# Patient Record
Sex: Female | Born: 1985 | Race: Black or African American | Hispanic: No | Marital: Single | Smoking: Current some day smoker
Health system: Southern US, Community
[De-identification: ages and names within clinical notes are randomized; demographics above are authoritative.]

## PROBLEM LIST (undated history)

## (undated) ENCOUNTER — Inpatient Hospital Stay (HOSPITAL_COMMUNITY): Payer: Self-pay

## (undated) ENCOUNTER — Ambulatory Visit: Payer: MEDICAID | Source: Home / Self Care

## (undated) DIAGNOSIS — B3731 Acute candidiasis of vulva and vagina: Secondary | ICD-10-CM

## (undated) DIAGNOSIS — B373 Candidiasis of vulva and vagina: Secondary | ICD-10-CM

## (undated) DIAGNOSIS — J45909 Unspecified asthma, uncomplicated: Secondary | ICD-10-CM

## (undated) DIAGNOSIS — F329 Major depressive disorder, single episode, unspecified: Secondary | ICD-10-CM

## (undated) DIAGNOSIS — B9689 Other specified bacterial agents as the cause of diseases classified elsewhere: Secondary | ICD-10-CM

## (undated) DIAGNOSIS — D649 Anemia, unspecified: Secondary | ICD-10-CM

## (undated) DIAGNOSIS — N390 Urinary tract infection, site not specified: Secondary | ICD-10-CM

## (undated) DIAGNOSIS — Z9289 Personal history of other medical treatment: Secondary | ICD-10-CM

## (undated) DIAGNOSIS — F32A Depression, unspecified: Secondary | ICD-10-CM

## (undated) DIAGNOSIS — R112 Nausea with vomiting, unspecified: Secondary | ICD-10-CM

## (undated) DIAGNOSIS — R51 Headache: Secondary | ICD-10-CM

## (undated) DIAGNOSIS — A599 Trichomoniasis, unspecified: Secondary | ICD-10-CM

## (undated) DIAGNOSIS — Z9889 Other specified postprocedural states: Secondary | ICD-10-CM

## (undated) DIAGNOSIS — L02411 Cutaneous abscess of right axilla: Secondary | ICD-10-CM

## (undated) DIAGNOSIS — A749 Chlamydial infection, unspecified: Secondary | ICD-10-CM

## (undated) DIAGNOSIS — N760 Acute vaginitis: Principal | ICD-10-CM

## (undated) DIAGNOSIS — A549 Gonococcal infection, unspecified: Secondary | ICD-10-CM

## (undated) HISTORY — PX: OTHER SURGICAL HISTORY: SHX169

## (undated) HISTORY — PX: WISDOM TOOTH EXTRACTION: SHX21

## (undated) HISTORY — DX: Depression, unspecified: F32.A

---
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
## 2007-02-02 ENCOUNTER — Emergency Department (HOSPITAL_COMMUNITY): Admission: EM | Admit: 2007-02-02 | Discharge: 2007-02-02 | Payer: Self-pay | Admitting: Emergency Medicine

## 2007-02-06 ENCOUNTER — Emergency Department (HOSPITAL_COMMUNITY): Admission: EM | Admit: 2007-02-06 | Discharge: 2007-02-07 | Payer: Self-pay | Admitting: Emergency Medicine

## 2007-02-21 ENCOUNTER — Emergency Department (HOSPITAL_COMMUNITY): Admission: EM | Admit: 2007-02-21 | Discharge: 2007-02-21 | Payer: Self-pay | Admitting: Emergency Medicine

## 2007-02-24 ENCOUNTER — Ambulatory Visit: Payer: Self-pay | Admitting: Family Medicine

## 2007-04-14 ENCOUNTER — Emergency Department: Payer: Self-pay | Admitting: Emergency Medicine

## 2007-09-28 ENCOUNTER — Emergency Department (HOSPITAL_COMMUNITY): Admission: EM | Admit: 2007-09-28 | Discharge: 2007-09-28 | Payer: Self-pay | Admitting: Emergency Medicine

## 2007-10-17 ENCOUNTER — Emergency Department (HOSPITAL_COMMUNITY): Admission: EM | Admit: 2007-10-17 | Discharge: 2007-10-18 | Payer: Self-pay | Admitting: Emergency Medicine

## 2007-12-12 ENCOUNTER — Emergency Department (HOSPITAL_COMMUNITY): Admission: EM | Admit: 2007-12-12 | Discharge: 2007-12-13 | Payer: Self-pay | Admitting: Emergency Medicine

## 2007-12-22 HISTORY — PX: WISDOM TOOTH EXTRACTION: SHX21

## 2007-12-22 HISTORY — PX: CYSTECTOMY: SUR359

## 2008-01-07 ENCOUNTER — Emergency Department (HOSPITAL_COMMUNITY): Admission: EM | Admit: 2008-01-07 | Discharge: 2008-01-07 | Payer: Self-pay | Admitting: Family Medicine

## 2008-01-26 ENCOUNTER — Ambulatory Visit: Payer: Self-pay | Admitting: Family Medicine

## 2008-03-06 ENCOUNTER — Ambulatory Visit: Payer: Self-pay | Admitting: Family Medicine

## 2008-04-02 ENCOUNTER — Emergency Department (HOSPITAL_COMMUNITY): Admission: EM | Admit: 2008-04-02 | Discharge: 2008-04-02 | Payer: Self-pay | Admitting: Family Medicine

## 2008-05-02 ENCOUNTER — Ambulatory Visit (HOSPITAL_BASED_OUTPATIENT_CLINIC_OR_DEPARTMENT_OTHER): Admission: RE | Admit: 2008-05-02 | Discharge: 2008-05-02 | Payer: Self-pay | Admitting: General Surgery

## 2008-05-02 ENCOUNTER — Encounter (INDEPENDENT_AMBULATORY_CARE_PROVIDER_SITE_OTHER): Payer: Self-pay | Admitting: General Surgery

## 2008-05-19 ENCOUNTER — Emergency Department (HOSPITAL_COMMUNITY): Admission: EM | Admit: 2008-05-19 | Discharge: 2008-05-19 | Payer: Self-pay | Admitting: Emergency Medicine

## 2008-06-25 ENCOUNTER — Other Ambulatory Visit: Payer: Self-pay | Admitting: Obstetrics & Gynecology

## 2008-06-25 ENCOUNTER — Emergency Department (HOSPITAL_COMMUNITY): Admission: EM | Admit: 2008-06-25 | Discharge: 2008-06-25 | Payer: Self-pay | Admitting: Emergency Medicine

## 2008-10-05 ENCOUNTER — Emergency Department (HOSPITAL_COMMUNITY): Admission: EM | Admit: 2008-10-05 | Discharge: 2008-10-05 | Payer: Self-pay | Admitting: Emergency Medicine

## 2008-12-27 ENCOUNTER — Emergency Department (HOSPITAL_COMMUNITY): Admission: EM | Admit: 2008-12-27 | Discharge: 2008-12-27 | Payer: Self-pay | Admitting: Emergency Medicine

## 2009-02-05 ENCOUNTER — Emergency Department (HOSPITAL_COMMUNITY): Admission: EM | Admit: 2009-02-05 | Discharge: 2009-02-05 | Payer: Self-pay | Admitting: Emergency Medicine

## 2009-12-26 ENCOUNTER — Emergency Department (HOSPITAL_COMMUNITY): Admission: EM | Admit: 2009-12-26 | Discharge: 2009-12-26 | Payer: Self-pay | Admitting: Emergency Medicine

## 2010-01-31 ENCOUNTER — Emergency Department (HOSPITAL_COMMUNITY): Admission: EM | Admit: 2010-01-31 | Discharge: 2010-01-31 | Payer: Self-pay | Admitting: Emergency Medicine

## 2010-03-05 ENCOUNTER — Emergency Department (HOSPITAL_COMMUNITY): Admission: EM | Admit: 2010-03-05 | Discharge: 2010-03-05 | Payer: Self-pay | Admitting: Family Medicine

## 2010-03-06 ENCOUNTER — Emergency Department (HOSPITAL_COMMUNITY): Admission: EM | Admit: 2010-03-06 | Discharge: 2010-03-06 | Payer: Self-pay | Admitting: Emergency Medicine

## 2010-04-23 ENCOUNTER — Emergency Department (HOSPITAL_COMMUNITY): Admission: EM | Admit: 2010-04-23 | Discharge: 2010-04-23 | Payer: Self-pay | Admitting: Family Medicine

## 2010-05-10 ENCOUNTER — Emergency Department (HOSPITAL_COMMUNITY): Admission: EM | Admit: 2010-05-10 | Discharge: 2010-05-10 | Payer: Self-pay | Admitting: Family Medicine

## 2010-05-22 ENCOUNTER — Emergency Department (HOSPITAL_COMMUNITY): Admission: EM | Admit: 2010-05-22 | Discharge: 2010-05-23 | Payer: Self-pay | Admitting: Emergency Medicine

## 2010-05-22 ENCOUNTER — Emergency Department (HOSPITAL_COMMUNITY): Admission: EM | Admit: 2010-05-22 | Discharge: 2010-05-22 | Payer: Self-pay | Admitting: Emergency Medicine

## 2010-07-04 ENCOUNTER — Emergency Department (HOSPITAL_COMMUNITY): Admission: EM | Admit: 2010-07-04 | Discharge: 2010-07-04 | Payer: Self-pay | Admitting: Family Medicine

## 2010-07-14 ENCOUNTER — Ambulatory Visit: Payer: Self-pay | Admitting: Obstetrics and Gynecology

## 2010-07-14 ENCOUNTER — Inpatient Hospital Stay (HOSPITAL_COMMUNITY): Admission: AD | Admit: 2010-07-14 | Discharge: 2010-07-14 | Payer: Self-pay | Admitting: Obstetrics & Gynecology

## 2010-08-12 ENCOUNTER — Ambulatory Visit (HOSPITAL_COMMUNITY): Admission: RE | Admit: 2010-08-12 | Discharge: 2010-08-12 | Payer: Self-pay | Admitting: Family Medicine

## 2010-09-06 ENCOUNTER — Ambulatory Visit (HOSPITAL_COMMUNITY): Admission: RE | Admit: 2010-09-06 | Discharge: 2010-09-06 | Payer: Self-pay | Admitting: Family Medicine

## 2010-09-07 ENCOUNTER — Ambulatory Visit: Payer: Self-pay | Admitting: Family Medicine

## 2010-09-07 ENCOUNTER — Inpatient Hospital Stay (HOSPITAL_COMMUNITY): Admission: AD | Admit: 2010-09-07 | Discharge: 2010-09-07 | Payer: Self-pay | Admitting: Obstetrics & Gynecology

## 2010-09-19 ENCOUNTER — Ambulatory Visit: Payer: Self-pay | Admitting: Physician Assistant

## 2010-09-19 ENCOUNTER — Inpatient Hospital Stay (HOSPITAL_COMMUNITY): Admission: AD | Admit: 2010-09-19 | Discharge: 2010-09-19 | Payer: Self-pay | Admitting: Obstetrics and Gynecology

## 2010-09-22 ENCOUNTER — Ambulatory Visit (HOSPITAL_COMMUNITY): Admission: RE | Admit: 2010-09-22 | Discharge: 2010-09-22 | Payer: Self-pay | Admitting: Anesthesiology

## 2010-11-04 ENCOUNTER — Ambulatory Visit (HOSPITAL_COMMUNITY): Admission: RE | Admit: 2010-11-04 | Discharge: 2010-11-04 | Payer: Self-pay | Admitting: Family Medicine

## 2010-11-04 ENCOUNTER — Encounter: Payer: Self-pay | Admitting: Family Medicine

## 2010-11-08 ENCOUNTER — Inpatient Hospital Stay (HOSPITAL_COMMUNITY)
Admission: AD | Admit: 2010-11-08 | Discharge: 2010-11-08 | Payer: Self-pay | Source: Home / Self Care | Admitting: Anesthesiology

## 2010-11-22 ENCOUNTER — Observation Stay: Payer: Self-pay

## 2010-11-22 ENCOUNTER — Inpatient Hospital Stay (HOSPITAL_COMMUNITY)
Admission: AD | Admit: 2010-11-22 | Discharge: 2010-12-03 | Payer: Self-pay | Source: Home / Self Care | Attending: Obstetrics & Gynecology | Admitting: Obstetrics & Gynecology

## 2010-11-30 ENCOUNTER — Encounter: Payer: Self-pay | Admitting: Family Medicine

## 2010-12-21 HISTORY — DX: Maternal care for unspecified type scar from previous cesarean delivery: O34.219

## 2011-03-03 LAB — URINALYSIS, ROUTINE W REFLEX MICROSCOPIC
Bilirubin Urine: NEGATIVE
Bilirubin Urine: NEGATIVE
Ketones, ur: 15 mg/dL — AB
Ketones, ur: NEGATIVE mg/dL
Nitrite: NEGATIVE
Nitrite: NEGATIVE
Specific Gravity, Urine: 1.015 (ref 1.005–1.030)
Specific Gravity, Urine: 1.025 (ref 1.005–1.030)
Urobilinogen, UA: 0.2 mg/dL (ref 0.0–1.0)
Urobilinogen, UA: 0.2 mg/dL (ref 0.0–1.0)

## 2011-03-03 LAB — URINE CULTURE
Colony Count: NO GROWTH
Culture  Setup Time: 201111201132
Culture  Setup Time: 201112101745
Culture: NO GROWTH

## 2011-03-03 LAB — COMPREHENSIVE METABOLIC PANEL
ALT: 10 U/L (ref 0–35)
AST: 18 U/L (ref 0–37)
CO2: 19 mEq/L (ref 19–32)
Chloride: 105 mEq/L (ref 96–112)
GFR calc Af Amer: >60 mL/min (ref >60)
GFR calc non Af Amer: >60 mL/min (ref >60)
Sodium: 134 mEq/L — ABNORMAL LOW (ref 135–145)
Total Bilirubin: 0.3 mg/dL (ref 0.3–1.2)

## 2011-03-03 LAB — CBC
HCT: 22.4 % — ABNORMAL LOW (ref 36.0–46.0)
HCT: 25.4 % — ABNORMAL LOW (ref 36.0–46.0)
Hemoglobin: 7.4 g/dL — ABNORMAL LOW (ref 12.0–15.0)
Hemoglobin: 8.1 g/dL — ABNORMAL LOW (ref 12.0–15.0)
Hemoglobin: 9.1 g/dL — ABNORMAL LOW (ref 12.0–15.0)
MCH: 23.1 pg — ABNORMAL LOW (ref 26.0–34.0)
MCV: 72 fL — ABNORMAL LOW (ref 78.0–100.0)
MCV: 72 fL — ABNORMAL LOW (ref 78.0–100.0)
MCV: 72 fL — ABNORMAL LOW (ref 78.0–100.0)
Platelets: 151 10*3/uL (ref 150–400)
RBC: 3.53 MIL/uL — ABNORMAL LOW (ref 3.87–5.11)
RBC: 3.99 MIL/uL (ref 3.87–5.11)
RDW: 16.2 % — ABNORMAL HIGH (ref 11.5–15.5)
WBC: 11 10*3/uL — ABNORMAL HIGH (ref 4.0–10.5)
WBC: 12.4 10*3/uL — ABNORMAL HIGH (ref 4.0–10.5)
WBC: 13.8 10*3/uL — ABNORMAL HIGH (ref 4.0–10.5)

## 2011-03-03 LAB — STREP B DNA PROBE: Strep Group B Ag: POSITIVE

## 2011-03-03 LAB — DIFFERENTIAL
Basophils Absolute: 0 10*3/uL (ref 0.0–0.1)
Basophils Relative: 0 % (ref 0–1)
Eosinophils Relative: 1 % (ref 0–5)
Monocytes Absolute: 0.3 10*3/uL (ref 0.1–1.0)
Neutro Abs: 11.2 10*3/uL — ABNORMAL HIGH (ref 1.7–7.7)

## 2011-03-03 LAB — URINALYSIS, MICROSCOPIC ONLY
Bilirubin Urine: NEGATIVE
Glucose, UA: NEGATIVE mg/dL
Hgb urine dipstick: NEGATIVE
Ketones, ur: NEGATIVE mg/dL
Leukocytes, UA: NEGATIVE
pH: 7 (ref 5.0–8.0)

## 2011-03-03 LAB — GC/CHLAMYDIA PROBE AMP, URINE
Chlamydia, Swab/Urine, PCR: NEGATIVE
GC Probe Amp, Urine: NEGATIVE

## 2011-03-03 LAB — URINE MICROSCOPIC-ADD ON

## 2011-03-03 LAB — RAPID URINE DRUG SCREEN, HOSP PERFORMED: Tetrahydrocannabinol: NOT DETECTED

## 2011-03-05 LAB — URINALYSIS, ROUTINE W REFLEX MICROSCOPIC
Bilirubin Urine: NEGATIVE
Glucose, UA: NEGATIVE mg/dL
Hgb urine dipstick: NEGATIVE
Ketones, ur: >80 mg/dL — AB
Ketones, ur: NEGATIVE mg/dL
Nitrite: NEGATIVE
Specific Gravity, Urine: 1.02 (ref 1.005–1.030)
Urobilinogen, UA: 0.2 mg/dL (ref 0.0–1.0)
pH: 6 (ref 5.0–8.0)
pH: 6.5 (ref 5.0–8.0)

## 2011-03-05 LAB — URINE MICROSCOPIC-ADD ON

## 2011-03-05 LAB — URINE CULTURE: Culture  Setup Time: 201109302127

## 2011-03-05 LAB — WET PREP, GENITAL: Yeast Wet Prep HPF POC: NONE SEEN

## 2011-03-07 LAB — URINALYSIS, ROUTINE W REFLEX MICROSCOPIC
Glucose, UA: NEGATIVE mg/dL
Hgb urine dipstick: NEGATIVE
Specific Gravity, Urine: >1.03 — ABNORMAL HIGH (ref 1.005–1.030)
pH: 6 (ref 5.0–8.0)

## 2011-03-07 LAB — GC/CHLAMYDIA PROBE AMP, GENITAL: GC Probe Amp, Genital: NEGATIVE

## 2011-03-07 LAB — POCT URINALYSIS DIP (DEVICE)
Glucose, UA: NEGATIVE mg/dL
Nitrite: NEGATIVE
Specific Gravity, Urine: 1.02 (ref 1.005–1.030)
Urobilinogen, UA: 2 mg/dL — ABNORMAL HIGH (ref 0.0–1.0)

## 2011-03-07 LAB — WET PREP, GENITAL: Trich, Wet Prep: NONE SEEN

## 2011-03-07 LAB — POCT PREGNANCY, URINE: Preg Test, Ur: POSITIVE

## 2011-03-08 LAB — RAPID STREP SCREEN (MED CTR MEBANE ONLY): Streptococcus, Group A Screen (Direct): POSITIVE — AB

## 2011-03-09 LAB — URINALYSIS, ROUTINE W REFLEX MICROSCOPIC
Nitrite: POSITIVE — AB
Specific Gravity, Urine: 1.024 (ref 1.005–1.030)
Urobilinogen, UA: 2 mg/dL — ABNORMAL HIGH (ref 0.0–1.0)
pH: 6 (ref 5.0–8.0)

## 2011-03-09 LAB — URINE MICROSCOPIC-ADD ON

## 2011-03-09 LAB — WET PREP, GENITAL

## 2011-03-09 LAB — GC/CHLAMYDIA PROBE AMP, GENITAL: GC Probe Amp, Genital: NEGATIVE

## 2011-03-10 LAB — GC/CHLAMYDIA PROBE AMP, GENITAL
Chlamydia, DNA Probe: POSITIVE — AB
GC Probe Amp, Genital: NEGATIVE

## 2011-03-10 LAB — WET PREP, GENITAL

## 2011-04-02 ENCOUNTER — Inpatient Hospital Stay (INDEPENDENT_AMBULATORY_CARE_PROVIDER_SITE_OTHER)
Admission: RE | Admit: 2011-04-02 | Discharge: 2011-04-02 | Disposition: A | Discharge status: Home / Self Care | Payer: Self-pay | Source: Ambulatory Visit | Attending: Emergency Medicine | Admitting: Emergency Medicine

## 2011-04-02 ENCOUNTER — Inpatient Hospital Stay (HOSPITAL_COMMUNITY)
Admission: AD | Admit: 2011-04-02 | Discharge: 2011-04-02 | Disposition: A | Discharge status: Home / Self Care | Payer: Medicaid Other | Source: Ambulatory Visit | Attending: Obstetrics & Gynecology | Admitting: Obstetrics & Gynecology

## 2011-04-02 ENCOUNTER — Inpatient Hospital Stay (HOSPITAL_COMMUNITY): Payer: Medicaid Other

## 2011-04-02 DIAGNOSIS — R58 Hemorrhage, not elsewhere classified: Secondary | ICD-10-CM

## 2011-04-02 DIAGNOSIS — O209 Hemorrhage in early pregnancy, unspecified: Secondary | ICD-10-CM

## 2011-04-02 DIAGNOSIS — O200 Threatened abortion: Secondary | ICD-10-CM

## 2011-04-02 DIAGNOSIS — N949 Unspecified condition associated with female genital organs and menstrual cycle: Secondary | ICD-10-CM

## 2011-04-02 LAB — CBC
HCT: 32.5 % — ABNORMAL LOW (ref 36.0–46.0)
Hemoglobin: 9.7 g/dL — ABNORMAL LOW (ref 12.0–15.0)
MCHC: 29.8 g/dL — ABNORMAL LOW (ref 30.0–36.0)
MCV: 64.9 fL — ABNORMAL LOW (ref 78.0–100.0)
RDW: 18.4 % — ABNORMAL HIGH (ref 11.5–15.5)

## 2011-04-02 LAB — POCT I-STAT, CHEM 8
BUN: 4 mg/dL — ABNORMAL LOW (ref 6–23)
Calcium, Ion: 1.21 mmol/L (ref 1.12–1.32)
Chloride: 101 mEq/L (ref 96–112)
Creatinine, Ser: 0.7 mg/dL (ref 0.4–1.2)
Glucose, Bld: 88 mg/dL (ref 70–99)
Potassium: 3.6 mEq/L (ref 3.5–5.1)

## 2011-04-02 LAB — POCT URINALYSIS DIP (DEVICE)
Protein, ur: 30 mg/dL — AB
Specific Gravity, Urine: >=1.03 (ref 1.005–1.030)
pH: 5.5 (ref 5.0–8.0)

## 2011-04-06 LAB — URINALYSIS, ROUTINE W REFLEX MICROSCOPIC
Bilirubin Urine: NEGATIVE
Glucose, UA: NEGATIVE mg/dL
Ketones, ur: NEGATIVE mg/dL
Nitrite: NEGATIVE
Specific Gravity, Urine: 1.019 (ref 1.005–1.030)
pH: 6 (ref 5.0–8.0)

## 2011-04-06 LAB — DIFFERENTIAL
Basophils Relative: 0 % (ref 0–1)
Eosinophils Absolute: 0.2 10*3/uL (ref 0.0–0.7)
Eosinophils Relative: 4 % (ref 0–5)
Lymphs Abs: 1.1 10*3/uL (ref 0.7–4.0)
Monocytes Absolute: 0.2 10*3/uL (ref 0.1–1.0)
Monocytes Relative: 5 % (ref 3–12)

## 2011-04-06 LAB — URINE CULTURE

## 2011-04-06 LAB — URINE MICROSCOPIC-ADD ON

## 2011-04-06 LAB — POCT I-STAT, CHEM 8
BUN: 7 mg/dL (ref 6–23)
Calcium, Ion: 1.12 mmol/L (ref 1.12–1.32)
Creatinine, Ser: 0.7 mg/dL (ref 0.4–1.2)
Glucose, Bld: 95 mg/dL (ref 70–99)
Hemoglobin: 12.9 g/dL (ref 12.0–15.0)
TCO2: 23 mmol/L (ref 0–100)

## 2011-04-06 LAB — CBC
HCT: 33.7 % — ABNORMAL LOW (ref 36.0–46.0)
MCHC: 32.3 g/dL (ref 30.0–36.0)
MCV: 71.2 fL — ABNORMAL LOW (ref 78.0–100.0)
RBC: 4.73 MIL/uL (ref 3.87–5.11)
WBC: 4.3 10*3/uL (ref 4.0–10.5)

## 2011-04-06 LAB — PREGNANCY, URINE: Preg Test, Ur: NEGATIVE

## 2011-04-06 LAB — GC/CHLAMYDIA PROBE AMP, GENITAL: GC Probe Amp, Genital: NEGATIVE

## 2011-04-06 LAB — WET PREP, GENITAL: Trich, Wet Prep: NONE SEEN

## 2011-04-24 ENCOUNTER — Inpatient Hospital Stay (HOSPITAL_COMMUNITY)
Admission: AD | Admit: 2011-04-24 | Discharge: 2011-04-24 | Disposition: A | Discharge status: Home / Self Care | Payer: Medicaid Other | Source: Ambulatory Visit | Attending: Obstetrics & Gynecology | Admitting: Obstetrics & Gynecology

## 2011-04-24 DIAGNOSIS — O200 Threatened abortion: Secondary | ICD-10-CM

## 2011-04-24 LAB — HCG, QUANTITATIVE, PREGNANCY: hCG, Beta Chain, Quant, S: 42 m[IU]/mL — ABNORMAL HIGH (ref <5)

## 2011-04-24 LAB — WET PREP, GENITAL
Clue Cells Wet Prep HPF POC: NONE SEEN
Trich, Wet Prep: NONE SEEN
Yeast Wet Prep HPF POC: NONE SEEN

## 2011-04-25 LAB — GC/CHLAMYDIA PROBE AMP, GENITAL
Chlamydia, DNA Probe: NEGATIVE
GC Probe Amp, Genital: NEGATIVE

## 2011-05-05 NOTE — Op Note (Signed)
Beth Gibson, Beth Gibson                 ACCOUNT NO.:  [id number]   MEDICAL RECORD NO.:  [id number]          PATIENT TYPE:  AMB   LOCATION:  DSC                          FACILITY:  [facility]   PHYSICIAN:  Gabrielle Dare. Janee Morn, M.D.DATE OF BIRTH:  Feb 27, 1986   DATE OF PROCEDURE:  05/02/2008  DATE OF DISCHARGE:                               OPERATIVE REPORT   PREOPERATIVE DIAGNOSIS:  Sebaceous cyst left buttock.   POSTOPERATIVE DIAGNOSIS:  Sebaceous cyst left buttock.   PROCEDURE:  Excision sebaceous cyst left buttock.   SURGEON:  Gabrielle Dare. Janee Morn, MD   ANESTHESIA:  MAC.   HISTORY OF PRESENT ILLNESS:  [name] is a 25 year old African-  American female who I evaluated in the office last week for a  symptomatic sebaceous cyst on her left buttock.  There was no acute  infection noted, so she presents for elective excision.   PROCEDURE IN DETAIL:  Informed consent was obtained.  The patient was  identified in the preop holding area.  She received intravenous  antibiotics.  Her site was marked.  She was brought to the operating  room.  MAC anesthesia was administered by the anesthesia staff.  She was  placed in prone position.  The left buttock was prepped and draped in  sterile fashion.  A mixture of 0.25% Marcaine and 1% lidocaine with  epinephrine was injected under and around the cyst.  A sinus was noted  to the lateral aspect, so a transverse incision was made also  encompassing an ellipse of skin.  Subcutaneous tissues were dissected  down completely around the cyst wall and the cyst was excised completely  and sent to pathology.  The wound was copiously irrigated.  Some  additional local anesthetic was injected.  Primary meticulous hemostasis  was obtained.  The skin was then closed with a running 4-0 Monocryl  subcuticular stitch and then Dermabond was placed.  The patient  tolerated the procedure well without apparent complication and was taken  to the recovery room in stable  condition.      Gabrielle Dare Janee Morn, M.D.  Electronically Signed     BET/MEDQ  D:  05/02/2008  T:  05/02/2008  Job:  [id number]   cc:   Maurice March, M.D.

## 2011-05-07 ENCOUNTER — Ambulatory Visit: Payer: Self-pay | Admitting: Obstetrics and Gynecology

## 2011-05-09 ENCOUNTER — Inpatient Hospital Stay (INDEPENDENT_AMBULATORY_CARE_PROVIDER_SITE_OTHER)
Admission: RE | Admit: 2011-05-09 | Discharge: 2011-05-09 | Disposition: A | Discharge status: Home / Self Care | Payer: Medicaid Other | Source: Ambulatory Visit | Attending: Family Medicine | Admitting: Family Medicine

## 2011-05-09 ENCOUNTER — Inpatient Hospital Stay (HOSPITAL_COMMUNITY): Admission: RE | Admit: 2011-05-09 | Payer: Medicaid Other | Source: Ambulatory Visit

## 2011-05-09 DIAGNOSIS — IMO0002 Reserved for concepts with insufficient information to code with codable children: Secondary | ICD-10-CM

## 2011-05-15 ENCOUNTER — Inpatient Hospital Stay (HOSPITAL_COMMUNITY)
Admission: RE | Admit: 2011-05-15 | Discharge: 2011-05-15 | Disposition: A | Discharge status: Home / Self Care | Payer: Medicaid Other | Source: Ambulatory Visit | Attending: Family Medicine | Admitting: Family Medicine

## 2011-09-15 LAB — POCT PREGNANCY, URINE
Operator id: 235561
Preg Test, Ur: NEGATIVE

## 2011-09-15 LAB — POCT URINALYSIS DIP (DEVICE)
Nitrite: NEGATIVE
Operator id: 235561
Protein, ur: 30 — AB
Urobilinogen, UA: 1
pH: 6

## 2011-09-15 LAB — GC/CHLAMYDIA PROBE AMP, GENITAL: GC Probe Amp, Genital: NEGATIVE

## 2011-09-16 LAB — URINALYSIS, ROUTINE W REFLEX MICROSCOPIC
Glucose, UA: NEGATIVE
Nitrite: NEGATIVE
pH: 6.5

## 2011-09-16 LAB — URINE MICROSCOPIC-ADD ON

## 2011-09-16 LAB — URINE CULTURE: Colony Count: NO GROWTH

## 2011-09-16 LAB — PREGNANCY, URINE: Preg Test, Ur: NEGATIVE

## 2011-09-17 LAB — URINE MICROSCOPIC-ADD ON

## 2011-09-17 LAB — URINALYSIS, ROUTINE W REFLEX MICROSCOPIC
Bilirubin Urine: NEGATIVE
Hgb urine dipstick: NEGATIVE
Nitrite: NEGATIVE
Protein, ur: NEGATIVE
Specific Gravity, Urine: >1.03 — ABNORMAL HIGH
Urobilinogen, UA: 0.2

## 2011-09-17 LAB — POCT PREGNANCY, URINE
Operator id: 242691
Preg Test, Ur: NEGATIVE

## 2011-09-21 ENCOUNTER — Encounter (HOSPITAL_COMMUNITY): Payer: Self-pay | Admitting: *Deleted

## 2011-09-21 ENCOUNTER — Inpatient Hospital Stay (HOSPITAL_COMMUNITY): Payer: Medicaid Other

## 2011-09-21 ENCOUNTER — Inpatient Hospital Stay (HOSPITAL_COMMUNITY)
Admission: AD | Admit: 2011-09-21 | Discharge: 2011-09-21 | Disposition: A | Discharge status: Home / Self Care | Payer: Medicaid Other | Source: Ambulatory Visit | Attending: Obstetrics and Gynecology | Admitting: Obstetrics and Gynecology

## 2011-09-21 DIAGNOSIS — O99891 Other specified diseases and conditions complicating pregnancy: Secondary | ICD-10-CM | POA: Insufficient documentation

## 2011-09-21 DIAGNOSIS — O209 Hemorrhage in early pregnancy, unspecified: Secondary | ICD-10-CM

## 2011-09-21 DIAGNOSIS — N949 Unspecified condition associated with female genital organs and menstrual cycle: Secondary | ICD-10-CM | POA: Insufficient documentation

## 2011-09-21 HISTORY — DX: Trichomoniasis, unspecified: A59.9

## 2011-09-21 HISTORY — DX: Chlamydial infection, unspecified: A74.9

## 2011-09-21 LAB — COMPREHENSIVE METABOLIC PANEL
Albumin: 4.1
Alkaline Phosphatase: 48
BUN: 5 — ABNORMAL LOW
Calcium: 9.4
Creatinine, Ser: 0.62
Glucose, Bld: 93
Total Protein: 8.2

## 2011-09-21 LAB — URINE MICROSCOPIC-ADD ON

## 2011-09-21 LAB — DIFFERENTIAL
Basophils Relative: 1
Lymphocytes Relative: 18
Monocytes Absolute: 0.4
Monocytes Relative: 6
Neutro Abs: 4
Neutrophils Relative %: 72

## 2011-09-21 LAB — URINALYSIS, ROUTINE W REFLEX MICROSCOPIC
Bilirubin Urine: NEGATIVE
Bilirubin Urine: NEGATIVE
Glucose, UA: NEGATIVE mg/dL
Nitrite: NEGATIVE
Specific Gravity, Urine: 1.02 (ref 1.005–1.030)
Specific Gravity, Urine: 1.018
Urobilinogen, UA: 1
pH: 7

## 2011-09-21 LAB — CBC
HCT: 35.9 — ABNORMAL LOW
Hemoglobin: 11.4 — ABNORMAL LOW
MCHC: 31.8
MCV: 71.8 — ABNORMAL LOW
Platelets: 223
RDW: 15.7 — ABNORMAL HIGH

## 2011-09-21 LAB — POCT PREGNANCY, URINE: Preg Test, Ur: POSITIVE

## 2011-09-21 LAB — PREGNANCY, URINE: Preg Test, Ur: NEGATIVE

## 2011-09-21 LAB — MONONUCLEOSIS SCREEN: Mono Screen: NEGATIVE

## 2011-09-21 LAB — LIPASE, BLOOD: Lipase: 22

## 2011-09-21 LAB — RAPID STREP SCREEN (MED CTR MEBANE ONLY): Streptococcus, Group A Screen (Direct): NEGATIVE

## 2011-09-21 LAB — HCG, QUANTITATIVE, PREGNANCY: hCG, Beta Chain, Quant, S: 4718 m[IU]/mL — ABNORMAL HIGH (ref <5)

## 2011-09-21 NOTE — ED Notes (Signed)
States feels pain and pressure over previous "c-section area." "Like I have to pee."

## 2011-09-21 NOTE — Progress Notes (Signed)
Pt states she had a POS HPT about one week ago. Started having severe lower abdominal pain last night. Continues to have abdominal pain, but is less than last night. States she had some spotting when she took the test, none now.

## 2011-09-21 NOTE — ED Provider Notes (Signed)
History   Beth Gibson is a 25 year old G3 P0111 who presents today for care of pelvic pain. The patient has not initiated [facility] yet, but plans to return to the [facility] as she has for previous pregnancy. She is approximately [redacted]w[redacted]d based on LMP, although she is not certain the date. The patient endorses pressure with urination, frequency, urgency and urinary retention without burning or increased pain with urination. She also endorses an episode of spotting x1 day last week. She admits to a thick, white vaginal discharge with foul odor. She does not have itching of burning of the vagina or vulva. She does voice some concern for STDs as she has a h/o chlamydia and trichomonas. She received treatment for both of these in the past and is sure her partner was also treated. She is still with the same partner.   Chief Complaint  Patient presents with  . Abdominal Pain   HPI  OB History    Grav Para Term Preterm Abortions TAB SAB Ect Mult Living   3 1 0 1 1 0 1 0 0 1       Past Medical History  Diagnosis Date  . Chlamydia   . Trichomonas     Past Surgical History  Procedure Date  . Cyst removed from l buttock   . Cesarean section   . Wisdom tooth extraction     No family history on file.  History  Substance Use Topics  . Smoking status: Never Smoker   . Smokeless tobacco: Never Used  . Alcohol Use: 0.5 oz/week    1 drink(s) per week     "Awhile back"    Allergies: No Known Allergies  No prescriptions prior to admission    ROS Physical Exam   Blood pressure 117/79, pulse 88, temperature 98.7 F (37.1 C), temperature source Oral, resp. rate 16, height 5\' 9"  (1.753 m), weight 91.899 kg (202 lb 9.6 oz), last menstrual period 08/20/2011.  Physical Exam  Constitutional: She is oriented to person, place, and time. She appears well-developed and well-nourished. No distress.  HENT:  Head: Normocephalic and atraumatic.  Eyes: Pupils are equal, round, and reactive to light.  Neck: Normal  range of motion.  Cardiovascular: Normal rate, regular rhythm and normal heart sounds.  Exam reveals no gallop and no friction rub.   No murmur heard. Respiratory: Effort normal and breath sounds normal.  GI: Soft. Bowel sounds are normal. She exhibits no distension and no mass. There is no tenderness. There is no guarding.  Genitourinary: Vagina normal and uterus normal. Uterus is not tender. Cervix exhibits discharge (thin, white mucous discharge). Cervix exhibits no motion tenderness and no friability. Right adnexum displays no mass and no tenderness. Left adnexum displays no mass and no tenderness. No erythema, tenderness or bleeding around the vagina.  Musculoskeletal: Normal range of motion.  Neurological: She is alert and oriented to person, place, and time.  Skin: Skin is warm and dry. No rash noted. No erythema.  Psychiatric: She has a normal mood and affect. Her behavior is normal.    MAU Course  Procedures  Assessment and Plan  Assessment: 1. Pregnancy 5w 0d per [place] today; consistent with BhCG levels 2. Pelvic pain with cervical discharge, unknown cause  Wet prep negative today, GC/CT sent today  UA negative today  Plan: 1. Return to MAU in 2 days for follow-up BhCG since yolk sac was not seen on [place] today 2. Call the [facility] to initiate PNC 3. Return here  as needed or if you have further bleeding or abdominal pain   Beth Gibson 09/21/2011, 2:45 PM   I was with the PA student during the exam and agree with the above plan.  Beth Gibson, DrHSc, MPAS, PA-C   Henrietta Hoover, PA 09/22/11 1428

## 2011-09-21 NOTE — ED Notes (Signed)
Patient to F/U in 48 hours for repeat BHCG

## 2011-09-21 NOTE — ED Notes (Signed)
E. Rice, PA discussed D/C instrs. and F/U

## 2011-09-22 LAB — GC/CHLAMYDIA PROBE AMP, GENITAL: Chlamydia, DNA Probe: NEGATIVE

## 2011-09-23 NOTE — ED Provider Notes (Signed)
Agree with above note.  Beth Gibson 09/23/2011 9:26 AM

## 2011-09-25 LAB — WET PREP, GENITAL

## 2011-09-25 LAB — GC/CHLAMYDIA PROBE AMP, GENITAL: GC Probe Amp, Genital: NEGATIVE

## 2011-09-25 LAB — POCT PREGNANCY, URINE
Operator id: 25670
Preg Test, Ur: NEGATIVE

## 2011-09-30 LAB — URINE MICROSCOPIC-ADD ON

## 2011-09-30 LAB — URINALYSIS, ROUTINE W REFLEX MICROSCOPIC
Glucose, UA: NEGATIVE
Protein, ur: 30 — AB
Specific Gravity, Urine: 1.036 — ABNORMAL HIGH
Urobilinogen, UA: 1

## 2011-09-30 LAB — CBC
HCT: 36.1
Hemoglobin: 11.3 — ABNORMAL LOW
MCV: 72.7 — ABNORMAL LOW
RBC: 4.96
WBC: 6.8

## 2011-09-30 LAB — BASIC METABOLIC PANEL
CO2: 25
Chloride: 97
GFR calc Af Amer: >60
Potassium: 3.8

## 2011-09-30 LAB — DIFFERENTIAL
Eosinophils Absolute: 0.1
Eosinophils Relative: 1
Lymphs Abs: 1.2
Monocytes Absolute: 0.1 — ABNORMAL LOW
Monocytes Relative: 2 — ABNORMAL LOW
Neutrophils Relative %: 79 — ABNORMAL HIGH

## 2011-09-30 LAB — WET PREP, GENITAL

## 2011-09-30 LAB — GC/CHLAMYDIA PROBE AMP, GENITAL: Chlamydia, DNA Probe: POSITIVE — AB

## 2011-10-01 LAB — POCT PREGNANCY, URINE
Operator id: 29452
Preg Test, Ur: NEGATIVE

## 2011-10-01 LAB — URINALYSIS, ROUTINE W REFLEX MICROSCOPIC
Protein, ur: NEGATIVE
Urobilinogen, UA: 1

## 2011-10-01 LAB — URINE MICROSCOPIC-ADD ON

## 2011-10-01 LAB — RAPID STREP SCREEN (MED CTR MEBANE ONLY): Streptococcus, Group A Screen (Direct): NEGATIVE

## 2011-10-11 ENCOUNTER — Emergency Department (HOSPITAL_COMMUNITY)
Admission: EM | Admit: 2011-10-11 | Discharge: 2011-10-11 | Disposition: A | Discharge status: Home / Self Care | Payer: Medicaid Other | Attending: Emergency Medicine | Admitting: Emergency Medicine

## 2011-10-11 DIAGNOSIS — IMO0002 Reserved for concepts with insufficient information to code with codable children: Secondary | ICD-10-CM | POA: Insufficient documentation

## 2011-11-30 ENCOUNTER — Other Ambulatory Visit (HOSPITAL_COMMUNITY): Payer: Self-pay | Admitting: Physician Assistant

## 2011-11-30 DIAGNOSIS — Z3689 Encounter for other specified antenatal screening: Secondary | ICD-10-CM

## 2011-11-30 LAB — CBC
HCT: 29 % — AB (ref 36–46)
Hemoglobin: 9 g/dL — AB (ref 12.0–16.0)

## 2011-11-30 LAB — GC/CHLAMYDIA PROBE AMP, GENITAL: Gonorrhea: NEGATIVE

## 2011-11-30 LAB — HEPATITIS B SURFACE ANTIGEN: Hepatitis B Surface Ag: NEGATIVE

## 2011-11-30 LAB — ABO/RH: RH Type: POSITIVE

## 2011-12-03 NOTE — Progress Notes (Signed)
Pt had flu vaccine given @ [facility].

## 2011-12-10 ENCOUNTER — Ambulatory Visit (INDEPENDENT_AMBULATORY_CARE_PROVIDER_SITE_OTHER): Payer: Medicaid Other | Admitting: Obstetrics and Gynecology

## 2011-12-10 DIAGNOSIS — Z9889 Other specified postprocedural states: Secondary | ICD-10-CM

## 2011-12-10 DIAGNOSIS — O34219 Maternal care for unspecified type scar from previous cesarean delivery: Secondary | ICD-10-CM

## 2011-12-10 DIAGNOSIS — E669 Obesity, unspecified: Secondary | ICD-10-CM | POA: Insufficient documentation

## 2011-12-10 DIAGNOSIS — O09219 Supervision of pregnancy with history of pre-term labor, unspecified trimester: Secondary | ICD-10-CM

## 2011-12-10 DIAGNOSIS — Z98891 History of uterine scar from previous surgery: Secondary | ICD-10-CM

## 2011-12-10 LAB — POCT URINALYSIS DIP (DEVICE)
Bilirubin Urine: NEGATIVE
Glucose, UA: NEGATIVE mg/dL
Hgb urine dipstick: NEGATIVE
Ketones, ur: NEGATIVE mg/dL
Nitrite: NEGATIVE
Protein, ur: NEGATIVE mg/dL
Specific Gravity, Urine: >=1.03 (ref 1.005–1.030)
Urobilinogen, UA: 1 mg/dL (ref 0.0–1.0)
pH: 5.5 (ref 5.0–8.0)

## 2011-12-10 MED ORDER — HYDROXYPROGESTERONE CAPROATE 250 MG/ML IM OIL
250.0000 mg | TOPICAL_OIL | INTRAMUSCULAR | Status: DC
  Administered 2011-12-10 – 2012-04-14 (×14): 250 mg via INTRAMUSCULAR

## 2011-12-10 MED ORDER — FLUCONAZOLE 150 MG PO TABS: 150.0000 mg | ORAL_TABLET | Freq: Once | ORAL | Status: AC

## 2011-12-10 NOTE — Progress Notes (Signed)
Patient doing well without complaints. Reports minimal nausea. Patient counseled on the use of 17-OHP and agreed. Patient scheduled for anatomy ultrasound on 12/23/2011. Patient with positive yeast on pap and c/o vaginal discharge with pruristis- will treat with diflucan

## 2011-12-10 NOTE — Progress Notes (Signed)
Pain/pressure- @ c-section incision.  Pulse-87 Vaginal discharge- white

## 2011-12-10 NOTE — Patient Instructions (Signed)
Hydroxyprogesterone solution for injection What is this medicine? HYDROXYPROGESTERONE (hye drox ee proe JES ter one) is a female hormone. This medicine is used in women who are pregnant and who have delivered a baby too early (preterm) in the past. It helps lower the risk of having a preterm baby again. This medicine may be used for other purposes; ask your health care provider or pharmacist if you have questions. What should I tell my health care provider before I take this medicine? They need to know if you have any of these conditions: -blood clotting disorders -breast, cervical, uterine, or vaginal cancer -depression -diabetes or prediabetes -heart disease -high blood pressure -kidney disease -liver disease -lung or breathing disease, like asthma -migraine headaches -seizures -vaginal bleeding -an unusual or allergic reaction to hydroxyprogesterone, other hormones, medicines, foods, dyes, castor oil, benzyl alcohol, or other preservatives -breast-feeding How should I use this medicine? This medicine is for injection into a muscle. It is given by a health care professional in a hospital or clinic setting. You are likely to get an injection once a week to prevent preterm delivery. Talk to your pediatrician regarding the use of this medicine in children. Special care may be needed. Overdosage: If you think you've taken too much of this medicine contact a poison control center or emergency room at once. Overdosage: If you think you have taken too much of this medicine contact a poison control center or emergency room at once. NOTE: This medicine is only for you. Do not share this medicine with others. What if I miss a dose? It is important not to miss your dose. Call your doctor or health care professional if you are unable to keep an appointment. What may interact with this medicine? -acetaminophen -bupropion -clozapine -efavirenz -halothane -methadone -nicotine -theophylline,  aminophylline -tizanidine This list may not describe all possible interactions. Give your health care provider a list of all the medicines, herbs, non-prescription drugs, or dietary supplements you use. Also tell them if you smoke, drink alcohol, or use illegal drugs. Some items may interact with your medicine. What should I watch for while using this medicine? Your condition will be monitored carefully while you are receiving this medicine. What side effects may I notice from receiving this medicine? Side effects that you should report to your doctor or health care professional as soon as possible: -allergic reactions like skin rash, itching or hives, swelling of the face, lips, or tongue -breathing problems -breast tissue changes or discharge -changes in vision -confusion, trouble speaking or understanding -depressed mood -increased hunger or thirst -increased urination -pain, redness, or irritation at site where injected -pain, swelling, warmth in the leg -shortness of breath, chest pain, swelling in a leg -sudden numbness or weakness of the face, arm or leg -sudden severe headaches -trouble walking, dizziness, loss of balance or coordination -unusually weak or tired -vaginal bleeding -yellowing of the eyes or skin  Side effects that usually do not require medical attention (report to your doctor or health care professional if they continue or are bothersome): -changes in emotions or moods -diarrhea -fluid retention and swelling -nausea This list may not describe all possible side effects. Call your doctor for medical advice about side effects. You may report side effects to FDA at 1-800-FDA-1088. Where should I keep my medicine? This drug is given in a hospital or clinic and will not be stored at home. NOTE: This sheet is a summary. It may not cover all possible information. If you have questions about  this medicine, talk to your doctor, pharmacist, or health care provider.   2012, Elsevier/Gold Standard. (01/28/2010 11:17:12 AM)  Preterm Birth Preterm birth is a birth which happens before 37 weeks of pregnancy. Most pregnancies last about 39 to 41 weeks. Every week in the womb is important and is beneficial to the health of the infant. Infants born before 37 weeks of pregnancy are at a higher risk for complications. Depending on when the infant was born, he or she may be:  Late preterm. Born between 32 and 37 weeks of pregnancy.   Very preterm. Born at less than 32 weeks of pregnancy.   Extremely preterm. Born at less than 25 weeks of pregnancy.  The earlier a baby is born, the more likely the child will have issues related to prematurity. Complications and problems that can be seen in infants born too early include:  Problems breathing (respiratory distress syndrome).   Low birth weight.   Problems feeding.   Sleeping problems.   Yellowing of the skin (jaundice).   Infections such as pneumonia.  Babies that are born very preterm or extremely preterm are at risk for more serious medical issues. These include:  Breathing issues.   Eyesight issues.   Brain development issues (intraventricular hemorrhage).   Behavioral and emotional development issues.   Growth and developmental delays.   Cerebral palsy.   Serious feeding or bowel complications (necrotizing enterocolitis).  CAUSES  There are 2 broad categories of preterm birth.  Spontaneous preterm birth. This is a birth resulting from preterm labor (not medically induced) or preterm premature rupture of membranes (PPROM).   Indicated preterm birth. This is a birth resulting from labor being medically induced due to health, personal, or social reasons.  Preterm birth may be related to certain medical conditions, lifestyle factors, or demographic factors encountered by the mother or fetus.   Medical conditions include:   Multiple gestations (twins, triplets, and so on).   Infection.    Diabetes.   Heart disease.   Kidney disease.   Cervical or uterine abnormalities.   Being underweight.   High blood pressure or preeclampsia.   Premature rupture of membranes (PROM).   Birth defects in the fetus.   Lifestyle factors include:   Poor prenatal care.   Poor nutrition or anemia.   Cigarette smoking.   Consuming alcohol.   High levels of stress and lack of social or emotional support.   Exposure to chemical or environmental toxins.   Substance abuse.   Demographic factors include:   African-American ethnicity.   Age (younger than 63 or older than 59).   Low socioeconomic status.  Women with a history of preterm labor or who become pregnant within 18 months of giving birth are also at increased risk for preterm birth. DIAGNOSIS  Your caregiver may request additional tests to diagnose underlying complications resulting from preterm birth. Tests on the infant may include:  Physical exam.   Blood tests.   Chest X-rays.   Heart-lung monitoring.  PREVENTION There are some things you can do to help lower your risk of having a preterm infant in the future. These include:  Good prenatal care throughout the entire pregnancy. See a caregiver regularly for advice and tests.   Management of underlying medical conditions.   Proper self-care and lifestyle changes.   Proper diet and weight control.   Watching for signs of various infections.  TREATMENT  After birth, special care will be taken to assess any problems or complications of  the infant. Supportive care will be provided for the infant. Treatment depends on what problems are present and any complications that develop. Some preterm infants are cared for in a neonatal intensive care unit. In general, care may include:  Maintaining temperature and oxygen in a clear heated box (baby isolette).   Monitoring the infant's heart rate, breathing, and level of oxygen in the blood.   Monitoring for  signs of infection and, if needed, giving intravenous (IV) antibiotic medicine.   Inserting a feeding tube (nose, mouth) or giving IV nutrition if unable to feed.   Inserting a breathing tube (ventilation).   Respiration support (continuous positive airway pressure [CPAP] or oxygen).  Treatment will change as the infant builds up strength and is able to breathe and eat on his or her own. For some infants, no special treatment is necessary. Parents may be educated on the potential health risks of prematurity to the infant. HOME CARE INSTRUCTIONS  Understand your infant's special conditions and needs. It may be reassuring to learn about infant CPR.   Monitor your infant in the car seat until he or she grows and matures. Infant car seats can cause breathing difficulties for preterm infants.   Keep your infant warm. Dress your infant in layers and keep him or her away from drafts, especially in cold months of the year.   Wash your hands thoroughly after going to the bathroom or changing a diaper. Late preterm infants may be more prone to infection.   Follow all your caregiver's instructions for providing support and care to your preterm infant.   Get support from organizations and groups that understand your challenges.   Follow up with your infant's caregiver as directed.  SEEK MEDICAL CARE IF:  Your infant has feeding difficulties.   Your infant has sleeping difficulties.   Your infant has breathing difficulties.   Your infant's skin starts to look yellow (jaundice).   Your infant shows signs of infection like a stuffy nose, fever, crying, or bluish color of the skin.  FOR MORE INFORMATION March of D[employer]: www.marchofdimes.com Prematurity.org: www.prematurity.org Document Released: 02/27/2004 Document Revised: 08/19/2011 Document Reviewed: 05/01/2010 [facility] Patient Information 2012 [place], [place].

## 2011-12-12 ENCOUNTER — Inpatient Hospital Stay (HOSPITAL_COMMUNITY)
Admission: AD | Admit: 2011-12-12 | Discharge: 2011-12-12 | Discharge status: Against Medical Advice | Payer: Medicaid Other | Source: Ambulatory Visit | Attending: Obstetrics and Gynecology | Admitting: Obstetrics and Gynecology

## 2011-12-12 DIAGNOSIS — O99891 Other specified diseases and conditions complicating pregnancy: Secondary | ICD-10-CM | POA: Insufficient documentation

## 2011-12-12 DIAGNOSIS — R109 Unspecified abdominal pain: Secondary | ICD-10-CM | POA: Insufficient documentation

## 2011-12-12 LAB — URINALYSIS, ROUTINE W REFLEX MICROSCOPIC
Bilirubin Urine: NEGATIVE
Ketones, ur: 15 mg/dL — AB
Nitrite: NEGATIVE
Protein, ur: NEGATIVE mg/dL

## 2011-12-12 LAB — URINE MICROSCOPIC-ADD ON

## 2011-12-12 NOTE — ED Notes (Signed)
Called patient three times at 1940, 2000, and 2016. No answer. Patient not in front lobby or radiology waiting room.

## 2011-12-12 NOTE — Progress Notes (Signed)
Pt reports having sharp abd pain on and off near c-section scar. reports having some spotting as well since yesterday.

## 2011-12-17 ENCOUNTER — Ambulatory Visit: Payer: Self-pay

## 2011-12-22 DIAGNOSIS — Z9289 Personal history of other medical treatment: Secondary | ICD-10-CM

## 2011-12-22 HISTORY — DX: Personal history of other medical treatment: Z92.89

## 2011-12-23 ENCOUNTER — Ambulatory Visit (HOSPITAL_COMMUNITY)
Admission: RE | Admit: 2011-12-23 | Discharge: 2011-12-23 | Disposition: A | Discharge status: Home / Self Care | Payer: Medicaid Other | Source: Ambulatory Visit | Attending: Physician Assistant | Admitting: Physician Assistant

## 2011-12-23 DIAGNOSIS — Z8751 Personal history of pre-term labor: Secondary | ICD-10-CM | POA: Insufficient documentation

## 2011-12-23 DIAGNOSIS — Z3689 Encounter for other specified antenatal screening: Secondary | ICD-10-CM

## 2011-12-23 DIAGNOSIS — O99210 Obesity complicating pregnancy, unspecified trimester: Secondary | ICD-10-CM | POA: Insufficient documentation

## 2011-12-23 DIAGNOSIS — O358XX0 Maternal care for other (suspected) fetal abnormality and damage, not applicable or unspecified: Secondary | ICD-10-CM | POA: Insufficient documentation

## 2011-12-23 DIAGNOSIS — E669 Obesity, unspecified: Secondary | ICD-10-CM | POA: Insufficient documentation

## 2011-12-23 DIAGNOSIS — O34219 Maternal care for unspecified type scar from previous cesarean delivery: Secondary | ICD-10-CM | POA: Insufficient documentation

## 2011-12-25 ENCOUNTER — Ambulatory Visit (INDEPENDENT_AMBULATORY_CARE_PROVIDER_SITE_OTHER): Payer: Medicaid Other | Admitting: *Deleted

## 2011-12-25 ENCOUNTER — Encounter: Payer: Self-pay | Admitting: *Deleted

## 2011-12-25 DIAGNOSIS — O09219 Supervision of pregnancy with history of pre-term labor, unspecified trimester: Secondary | ICD-10-CM

## 2011-12-31 ENCOUNTER — Ambulatory Visit (INDEPENDENT_AMBULATORY_CARE_PROVIDER_SITE_OTHER): Payer: Self-pay | Admitting: *Deleted

## 2011-12-31 DIAGNOSIS — O09219 Supervision of pregnancy with history of pre-term labor, unspecified trimester: Secondary | ICD-10-CM

## 2012-01-07 ENCOUNTER — Ambulatory Visit (INDEPENDENT_AMBULATORY_CARE_PROVIDER_SITE_OTHER): Payer: Medicaid Other | Admitting: Family Medicine

## 2012-01-07 DIAGNOSIS — O09219 Supervision of pregnancy with history of pre-term labor, unspecified trimester: Secondary | ICD-10-CM

## 2012-01-07 DIAGNOSIS — O0990 Supervision of high risk pregnancy, unspecified, unspecified trimester: Secondary | ICD-10-CM | POA: Insufficient documentation

## 2012-01-07 LAB — POCT URINALYSIS DIP (DEVICE)
Glucose, UA: 250 mg/dL — AB
Protein, ur: 30 mg/dL — AB
Specific Gravity, Urine: >=1.03 (ref 1.005–1.030)
Urobilinogen, UA: 1 mg/dL (ref 0.0–1.0)

## 2012-01-07 NOTE — Progress Notes (Signed)
Doing well---needs 17P Check urine culture

## 2012-01-07 NOTE — Patient Instructions (Signed)
Pregnancy - Second Trimester The second trimester of pregnancy (3 to 6 months) is a period of rapid growth for you and your baby. At the end of the sixth month, your baby is about 9 inches long and weighs 1 1/2 pounds. You will begin to feel the baby move between 18 and 20 weeks of the pregnancy. This is called quickening. Weight gain is faster. A clear fluid (colostrum) may leak out of your breasts. You may feel small contractions of the womb (uterus). This is known as false labor or Braxton-Hicks contractions. This is like a practice for labor when the baby is ready to be born. Usually, the problems with morning sickness have usually passed by the end of your first trimester. Some women develop small dark blotches (called cholasma, mask of pregnancy) on their face that usually goes away after the baby is born. Exposure to the sun makes the blotches worse. Acne may also develop in some pregnant women and pregnant women who have acne, may find that it goes away. PRENATAL EXAMS  Blood work may continue to be done during prenatal exams. These tests are done to check on your health and the probable health of your baby. Blood work is used to follow your blood levels (hemoglobin). Anemia (low hemoglobin) is common during pregnancy. Iron and vitamins are given to help prevent this. You will also be checked for diabetes between 24 and 28 weeks of the pregnancy. Some of the previous blood tests may be repeated.   The size of the uterus is measured during each visit. This is to make sure that the baby is continuing to grow properly according to the dates of the pregnancy.   Your blood pressure is checked every prenatal visit. This is to make sure you are not getting toxemia.   Your urine is checked to make sure you do not have an infection, diabetes or protein in the urine.   Your weight is checked often to make sure gains are happening at the suggested rate. This is to ensure that both you and your baby are  growing normally.   Sometimes, an ultrasound is performed to confirm the proper growth and development of the baby. This is a test which bounces harmless sound waves off the baby so your caregiver can more accurately determine due dates.  Sometimes, a specialized test is done on the amniotic fluid surrounding the baby. This test is called an amniocentesis. The amniotic fluid is obtained by sticking a needle into the belly (abdomen). This is done to check the chromosomes in instances where there is a concern about possible genetic problems with the baby. It is also sometimes done near the end of pregnancy if an early delivery is required. In this case, it is done to help make sure the baby's lungs are mature enough for the baby to live outside of the womb. CHANGES OCCURING IN THE SECOND TRIMESTER OF PREGNANCY Your body goes through many changes during pregnancy. They vary from person to person. Talk to your caregiver about changes you notice that you are concerned about.  During the second trimester, you will likely have an increase in your appetite. It is normal to have cravings for certain foods. This varies from person to person and pregnancy to pregnancy.   Your lower abdomen will begin to bulge.   You may have to urinate more often because the uterus and baby are pressing on your bladder. It is also common to get more bladder infections during pregnancy (  pain with urination). You can help this by drinking lots of fluids and emptying your bladder before and after intercourse.   You may begin to get stretch marks on your hips, abdomen, and breasts. These are normal changes in the body during pregnancy. There are no exercises or medications to take that prevent this change.   You may begin to develop swollen and bulging veins (varicose veins) in your legs. Wearing support hose, elevating your feet for 15 minutes, 3 to 4 times a day and limiting salt in your diet helps lessen the problem.    Heartburn may develop as the uterus grows and pushes up against the stomach. Antacids recommended by your caregiver helps with this problem. Also, eating smaller meals 4 to 5 times a day helps.   Constipation can be treated with a stool softener or adding bulk to your diet. Drinking lots of fluids, vegetables, fruits, and whole grains are helpful.   Exercising is also helpful. If you have been very active up until your pregnancy, most of these activities can be continued during your pregnancy. If you have been less active, it is helpful to start an exercise program such as walking.   Hemorrhoids (varicose veins in the rectum) may develop at the end of the second trimester. Warm sitz baths and hemorrhoid cream recommended by your caregiver helps hemorrhoid problems.   Backaches may develop during this time of your pregnancy. Avoid heavy lifting, wear low heal shoes and practice good posture to help with backache problems.   Some pregnant women develop tingling and numbness of their hand and fingers because of swelling and tightening of ligaments in the wrist (carpel tunnel syndrome). This goes away after the baby is born.   As your breasts enlarge, you may have to get a bigger bra. Get a comfortable, cotton, support bra. Do not get a nursing bra until the last month of the pregnancy if you will be nursing the baby.   You may get a dark line from your belly button to the pubic area called the linea nigra.   You may develop rosy cheeks because of increase blood flow to the face.   You may develop spider looking lines of the face, neck, arms and chest. These go away after the baby is born.  HOME CARE INSTRUCTIONS   It is extremely important to avoid all smoking, herbs, alcohol, and unprescribed drugs during your pregnancy. These chemicals affect the formation and growth of the baby. Avoid these chemicals throughout the pregnancy to ensure the delivery of a healthy infant.   Most of your home  care instructions are the same as suggested for the first trimester of your pregnancy. Keep your caregiver's appointments. Follow your caregiver's instructions regarding medication use, exercise and diet.   During pregnancy, you are providing food for you and your baby. Continue to eat regular, well-balanced meals. Choose foods such as meat, fish, milk and other low fat dairy products, vegetables, fruits, and whole-grain breads and cereals. Your caregiver will tell you of the ideal weight gain.   A physical sexual relationship may be continued up until near the end of pregnancy if there are no other problems. Problems could include early (premature) leaking of amniotic fluid from the membranes, vaginal bleeding, abdominal pain, or other medical or pregnancy problems.   Exercise regularly if there are no restrictions. Check with your caregiver if you are unsure of the safety of some of your exercises. The greatest weight gain will occur in the   last 2 trimesters of pregnancy. Exercise will help you:   Control your weight.   Get you in shape for labor and delivery.   Lose weight after you have the baby.   Wear a good support or jogging bra for breast tenderness during pregnancy. This may help if worn during sleep. Pads or tissues may be used in the bra if you are leaking colostrum.   Do not use hot tubs, steam rooms or saunas throughout the pregnancy.   Wear your seat belt at all times when driving. This protects you and your baby if you are in an accident.   Avoid raw meat, uncooked cheese, cat litter boxes and soil used by cats. These carry germs that can cause birth defects in the baby.   The second trimester is also a good time to visit your dentist for your dental health if this has not been done yet. Getting your teeth cleaned is OK. Use a soft toothbrush. Brush gently during pregnancy.   It is easier to loose urine during pregnancy. Tightening up and strengthening the pelvic muscles will  help with this problem. Practice stopping your urination while you are going to the bathroom. These are the same muscles you need to strengthen. It is also the muscles you would use as if you were trying to stop from passing gas. You can practice tightening these muscles up 10 times a set and repeating this about 3 times per day. Once you know what muscles to tighten up, do not perform these exercises during urination. It is more likely to contribute to an infection by backing up the urine.   Ask for help if you have financial, counseling or nutritional needs during pregnancy. Your caregiver will be able to offer counseling for these needs as well as refer you for other special needs.   Your skin may become oily. If so, wash your face with mild soap, use non-greasy moisturizer and oil or cream based makeup.  MEDICATIONS AND DRUG USE IN PREGNANCY  Take prenatal vitamins as directed. The vitamin should contain 1 milligram of folic acid. Keep all vitamins out of reach of children. Only a couple vitamins or tablets containing iron may be fatal to a baby or young child when ingested.   Avoid use of all medications, including herbs, over-the-counter medications, not prescribed or suggested by your caregiver. Only take over-the-counter or prescription medicines for pain, discomfort, or fever as directed by your caregiver. Do not use aspirin.   Let your caregiver also know about herbs you may be using.   Alcohol is related to a number of birth defects. This includes fetal alcohol syndrome. All alcohol, in any form, should be avoided completely. Smoking will cause low birth rate and premature babies.   Street or illegal drugs are very harmful to the baby. They are absolutely forbidden. A baby born to an addicted mother will be addicted at birth. The baby will go through the same withdrawal an adult does.  SEEK MEDICAL CARE IF:  You have any concerns or worries during your pregnancy. It is better to call with  your questions if you feel they cannot wait, rather than worry about them. SEEK IMMEDIATE MEDICAL CARE IF:   An unexplained oral temperature above 102 F (38.9 C) develops, or as your caregiver suggests.   You have leaking of fluid from the vagina (birth canal). If leaking membranes are suspected, take your temperature and tell your caregiver of this when you call.   There   is vaginal spotting, bleeding, or passing clots. Tell your caregiver of the amount and how many pads are used. Light spotting in pregnancy is common, especially following intercourse.   You develop a bad smelling vaginal discharge with a change in the color from clear to white.   You continue to feel sick to your stomach (nauseated) and have no relief from remedies suggested. You vomit blood or coffee ground-like materials.   You lose more than 2 pounds of weight or gain more than 2 pounds of weight over 1 week, or as suggested by your caregiver.   You notice swelling of your face, hands, feet, or legs.   You get exposed to German measles and have never had them.   You are exposed to fifth disease or chickenpox.   You develop belly (abdominal) pain. Round ligament discomfort is a common non-cancerous (benign) cause of abdominal pain in pregnancy. Your caregiver still must evaluate you.   You develop a bad headache that does not go away.   You develop fever, diarrhea, pain with urination, or shortness of breath.   You develop visual problems, blurry, or double vision.   You fall or are in a car accident or any kind of trauma.   There is mental or physical violence at home.  Document Released: 12/01/2001 Document Revised: 08/19/2011 Document Reviewed: 06/05/2009 [employer] Patient Information 2012 [employer]. Birth Control Choices Birth control is the use of any practices, methods, or devices to prevent pregnancy from happening in a sexually active woman.  Below are some birth control choices to help avoid  pregnancy.  Not having sex (abstinence) is the surest form of birth control. This requires self-control. There is no risk of acquiring a sexually transmitted disease (STD), including acquired immunodeficiency syndrome (AIDS).   Periodic abstinence requires self-control during certain times of the month.   Calendar method, timing your menstrual periods from month to month.   Ovulation method is avoiding sexual intercourse around the time you produce an egg (ovulate).   Symptotherm method is avoiding sexual intercourse at the time of ovulation, using a thermometer and ovulation symptoms.   Post ovulation method is the timing of sexual intercourse after you ovulated.  These methods do not protect against STDs, including AIDS.  Birth control pills (BCPs) contain estrogen and progesterone hormone. These medicines work by stopping the egg from forming in the ovary (ovulation). Birth control pills are prescribed by a caregiver who will ask you questions about the risks of taking BCPs. Birth control pills do not protect against STDs, including AIDS.   "Minipill" birth control pills have only the progesterone hormone. They are taken every day of each month and must be prescribed by your caregiver. They do not protect against STDs, including AIDS.   Emergency contraception is often call the "morning after" pill. This pill can be taken right after sex or up to five days after sex if you think your birth control failed, you failed to use contraception, or you were forced to have sex. It is most effective the sooner you take the pills after having sexual intercourse. Do not use emergency contraception as your only form of birth control. Emergency contraceptive pills are available without a prescription. Check with your pharmacist.   Condoms are a thin sheath of latex, synthetic material, or lambskin worn over the penis during sexual intercourse. They can have a spermicide in or on them when you buy them.  Latex condoms can prevent pregnancy and STDs. "Natural" or lambskin   condoms can prevent pregnancy but may not protect against STDs, including AIDS.   Female condoms are a soft, loose-fitting sheath that is put into the vagina before sexual intercourse. They can prevent pregnancy and STDs, including AIDS.   Sponge is a soft, circular piece of polyurethane foam with spermicide in it that is inserted into the vagina after wetting it and before sexual intercourse. It does not require a prescription from your caregiver. It does not protect against STDs, including AIDS.   Diaphragm is a soft, latex, dome-shaped barrier that must be fitted by a caregiver. It is inserted into the vagina, along with a spermicidal jelly. After the proper fitting for a diaphragm, always insert the diaphragm before intercourse. The diaphragm should be left in the vagina for 6 to 8 hours after intercourse. Removal and reinsertion with a spermicide is always necessary after any use. It does not protect against STDs, including AIDS.   Progesterone-only injections are given every 3 months to prevent pregnancy. These injections contain synthetic progesterone and no estrogen. This hormone stops the ovaries from releasing eggs. It also causes the cervical mucus to thicken and changes the uterine lining. This makes it harder for sperm to survive in the uterus. It does not protect against STDs, including AIDS.   Birth Control Patch contains hormones similar to those in birth control pills, so effectiveness, risks, and side effects are similar. It must be changed once a week and is prescribed by a caregiver. It is less effective in very overweight women. It does not protect against STDs, including AIDS.   Vaginal Ring contains hormones similar to those in birth control pills. It is left in place for 3 weeks, removed for 1 week, and then a new one is put back into the vagina. It comes with a timer to put in your purse to help you remember when  to take it out or put a new one in. A caregiver's examination and prescription is necessary, just like with birth control pills and the patch. It does not protect against STDs, including AIDS.   Estrogen plus progesterone injections are given every 28 to 30 days. They can be given in the upper arm, thigh, or buttocks. It does not protect against STDs, including AIDS.   Intrauterine device (IUD): copper T or progestin filled is a T-shaped device that is put in a woman's uterus during a menstrual period to prevent pregnancy. The copper T IUD can last 10 years, and the progestin IUD can last 5 years. The progestin IUD can also help control heavy menstrual periods. It does not protect against STDs, including AIDS. The copper T IUD can be used as emergency contraception if inserted within 5 days of having unprotected intercourse.   Cervical cap is a round, soft latex or plastic cup that fits over the cervix and must be fitted by a caregiver. You do not need to use a spermicide with it or remove and insert it every time you have sexual intercourse. It does not protect against STDs, including AIDS.   Spermicides are chemicals that kill or block sperm from entering the cervix and uterus. They come in the form of creams, jellies, suppositories, foam, or tablets, and they do not require a prescription. They are inserted into the vagina with an applicator before having sexual intercourse. This must be repeated every time you have sexual intercourse.   Withdrawal is using the method of the female withdrawing his penis from sexual intercourse before he has a climax   and deposits his sperm. It does not protect against STDs, including AIDS.   Female tubal ligation is when the woman's fallopian tubes are surgically sealed or tied to prevent the egg from traveling to the uterus. It does not protect against STDs, including AIDS.   Female sterilization is when the female has his tubes that carry sperm tied off (vasectomy) to  stop sperm from entering the vagina during sexual intercourse. It does not protect against STDs, including AIDS.  Regardless of which method of birth control you choose, it is still important that you use some form of protection against STDs. Document Released: 12/07/2005 Document Revised: 01/09/2011 Document Reviewed: 10/24/2009 [employer] Patient Information 2012 [employer]. Breastfeeding BENEFITS OF BREASTFEEDING For the baby  The first milk (colostrum) helps the baby's digestive system function better.   There are antibodies from the mother in the milk that help the baby fight off infections.   The baby has a lower incidence of asthma, allergies, and SIDS (sudden infant death syndrome).   The nutrients in breast milk are better than formulas for the baby and helps the baby's brain grow better.   Babies who breastfeed have less gas, colic, and constipation.  For the mother  Breastfeeding helps develop a very special bond between mother and baby.   It is more convenient, always available at the correct temperature and cheaper than formula feeding.   It burns calories in the mother and helps with losing weight that was gained during pregnancy.   It makes the uterus contract back down to normal size faster and slows bleeding following delivery.   Breastfeeding mothers have a lower risk of developing breast cancer.  NURSE FREQUENTLY  A healthy, full-term baby may breastfeed as often as every hour or space his or her feedings to every 3 hours.   How often to nurse will vary from baby to baby. Watch your baby for signs of hunger, not the clock.   Nurse as often as the baby requests, or when you feel the need to reduce the fullness of your breasts.   Awaken the baby if it has been 3 to 4 hours since the last feeding.   Frequent feeding will help the mother make more milk and will prevent problems like sore nipples and engorgement of the breasts.  BABY'S POSITION AT THE  BREAST  Whether lying down or sitting, be sure that the baby's tummy is facing your tummy.   Support the breast with 4 fingers underneath the breast and the thumb above. Make sure your fingers are well away from the nipple and baby's mouth.   Stroke the baby's lips and cheek closest to the breast gently with your finger or nipple.   When the baby's mouth is open wide enough, place all of your nipple and as much of the dark area around the nipple as possible into your baby's mouth.   Pull the baby in close so the tip of the nose and the baby's cheeks touch the breast during the feeding.  FEEDINGS  The length of each feeding varies from baby to baby and from feeding to feeding.   The baby must suck about 2 to 3 minutes for your milk to get to him or her. This is called a "let down." For this reason, allow the baby to feed on each breast as long as he or she wants. Your baby will end the feeding when he or she has received the right balance of nutrients.   To   break the suction, put your finger into the corner of the baby's mouth and slide it between his or her gums before removing your breast from his or her mouth. This will help prevent sore nipples.  REDUCING BREAST ENGORGEMENT  In the first week after your baby is born, you may experience signs of breast engorgement. When breasts are engorged, they feel heavy, warm, full, and may be tender to the touch. You can reduce engorgement if you:   Nurse frequently, every 2 to 3 hours. Mothers who breastfeed early and often have fewer problems with engorgement.   Place light ice packs on your breasts between feedings. This reduces swelling. Wrap the ice packs in a lightweight towel to protect your skin.   Apply moist hot packs to your breast for 5 to 10 minutes before each feeding. This increases circulation and helps the milk flow.   Gently massage your breast before and during the feeding.   Make sure that the baby empties at least one breast  at every feeding before switching sides.   Use a breast pump to empty the breasts if your baby is sleepy or not nursing well. You may also want to pump if you are returning to work or or you feel you are getting engorged.   Avoid bottle feeds, pacifiers or supplemental feedings of water or juice in place of breastfeeding.   Be sure the baby is latched on and positioned properly while breastfeeding.   Prevent fatigue, stress, and anemia.   Wear a supportive bra, avoiding underwire styles.   Eat a balanced diet with enough fluids.  If you follow these suggestions, your engorgement should improve in 24 to 48 hours. If you are still experiencing difficulty, call your lactation consultant or caregiver. IS MY BABY GETTING ENOUGH MILK? Sometimes, mothers worry about whether their babies are getting enough milk. You can be assured that your baby is getting enough milk if:  The baby is actively sucking and you hear swallowing.   The baby nurses at least 8 to 12 times in a 24 hour time period. Nurse your baby until he or she unlatches or falls asleep at the first breast (at least 10 to 20 minutes), then offer the second side.   The baby is wetting 5 to 6 disposable diapers (6 to 8 cloth diapers) in a 24 hour period by 5 to 6 days of age.   The baby is having at least 2 to 3 stools every 24 hours for the first few months. Breast milk is all the food your baby needs. It is not necessary for your baby to have water or formula. In fact, to help your breasts make more milk, it is best not to give your baby supplemental feedings during the early weeks.   The stool should be soft and yellow.   The baby should gain 4 to 7 ounces per week after he is 4 days old.  TAKE CARE OF YOURSELF Take care of your breasts by:  Bathing or showering daily.   Avoiding the use of soaps on your nipples.   Start feedings on your left breast at one feeding and on your right breast at the next feeding.   You will  notice an increase in your milk supply 2 to 5 days after delivery. You may feel some discomfort from engorgement, which makes your breasts very firm and often tender. Engorgement "peaks" out within 24 to 48 hours. In the meantime, apply warm moist towels to your   breasts for 5 to 10 minutes before feeding. Gentle massage and expression of some milk before feeding will soften your breasts, making it easier for your baby to latch on. Wear a well fitting nursing bra and air dry your nipples for 10 to 15 minutes after each feeding.   Only use cotton bra pads.   Only use pure lanolin on your nipples after nursing. You do not need to wash it off before nursing.  Take care of yourself by:   Eating well-balanced meals and nutritious snacks.   Drinking milk, fruit juice, and water to satisfy your thirst (about 8 glasses a day).   Getting plenty of rest.   Increasing calcium in your diet (1200 mg a day).   Avoiding foods that you notice affect the baby in a bad way.  SEEK MEDICAL CARE IF:   You have any questions or difficulty with breastfeeding.   You need help.   You have a hard, red, sore area on your breast, accompanied by a fever of 100.5 F (38.1 C) or more.   Your baby is too sleepy to eat well or is having trouble sleeping.   Your baby is wetting less than 6 diapers per day, by 5 days of age.   Your baby's skin or white part of his or her eyes is more yellow than it was in the hospital.   You feel depressed.  Document Released: 12/07/2005 Document Revised: 08/19/2011 Document Reviewed: 07/22/2009 Exit[employer] Patient Information 2012 [employer]. 

## 2012-01-09 LAB — CULTURE, OB URINE

## 2012-01-14 ENCOUNTER — Ambulatory Visit (INDEPENDENT_AMBULATORY_CARE_PROVIDER_SITE_OTHER): Payer: Medicaid Other | Admitting: *Deleted

## 2012-01-14 DIAGNOSIS — O09219 Supervision of pregnancy with history of pre-term labor, unspecified trimester: Secondary | ICD-10-CM

## 2012-01-14 LAB — POCT URINALYSIS DIP (DEVICE)
Glucose, UA: NEGATIVE mg/dL
Nitrite: NEGATIVE
Protein, ur: NEGATIVE mg/dL
Specific Gravity, Urine: 1.025 (ref 1.005–1.030)
Urobilinogen, UA: 1 mg/dL (ref 0.0–1.0)

## 2012-01-18 ENCOUNTER — Inpatient Hospital Stay (HOSPITAL_COMMUNITY)
Admission: AD | Admit: 2012-01-18 | Discharge: 2012-01-18 | Disposition: A | Discharge status: Home / Self Care | Payer: Medicaid Other | Source: Ambulatory Visit | Attending: Obstetrics & Gynecology | Admitting: Obstetrics & Gynecology

## 2012-01-18 ENCOUNTER — Encounter (HOSPITAL_COMMUNITY): Payer: Self-pay | Admitting: *Deleted

## 2012-01-18 DIAGNOSIS — A499 Bacterial infection, unspecified: Secondary | ICD-10-CM | POA: Insufficient documentation

## 2012-01-18 DIAGNOSIS — B9689 Other specified bacterial agents as the cause of diseases classified elsewhere: Secondary | ICD-10-CM | POA: Insufficient documentation

## 2012-01-18 DIAGNOSIS — N390 Urinary tract infection, site not specified: Secondary | ICD-10-CM | POA: Insufficient documentation

## 2012-01-18 DIAGNOSIS — O2390 Unspecified genitourinary tract infection in pregnancy, unspecified trimester: Secondary | ICD-10-CM | POA: Insufficient documentation

## 2012-01-18 DIAGNOSIS — R109 Unspecified abdominal pain: Secondary | ICD-10-CM | POA: Insufficient documentation

## 2012-01-18 DIAGNOSIS — N760 Acute vaginitis: Secondary | ICD-10-CM | POA: Insufficient documentation

## 2012-01-18 HISTORY — DX: Headache: R51

## 2012-01-18 HISTORY — DX: Urinary tract infection, site not specified: N39.0

## 2012-01-18 LAB — URINALYSIS, ROUTINE W REFLEX MICROSCOPIC
Bilirubin Urine: NEGATIVE
Glucose, UA: NEGATIVE mg/dL
Hgb urine dipstick: NEGATIVE
Ketones, ur: NEGATIVE mg/dL
Protein, ur: NEGATIVE mg/dL
Urobilinogen, UA: 1 mg/dL (ref 0.0–1.0)

## 2012-01-18 LAB — URINE MICROSCOPIC-ADD ON

## 2012-01-18 LAB — WET PREP, GENITAL
Trich, Wet Prep: NONE SEEN
Yeast Wet Prep HPF POC: NONE SEEN

## 2012-01-18 MED ORDER — ONDANSETRON 4 MG PO TBDP
4.0000 mg | ORAL_TABLET | Freq: Once | ORAL | Status: AC
  Administered 2012-01-18: 4 mg via ORAL
  Filled 2012-01-18: qty 1

## 2012-01-18 MED ORDER — NITROFURANTOIN MONOHYD MACRO 100 MG PO CAPS: 100.0000 mg | ORAL_CAPSULE | Freq: Two times a day (BID) | ORAL | Status: AC

## 2012-01-18 MED ORDER — METRONIDAZOLE 500 MG PO TABS: 500.0000 mg | ORAL_TABLET | Freq: Three times a day (TID) | ORAL | Status: AC

## 2012-01-18 NOTE — Progress Notes (Signed)
Patient states she started having abdominal cramping last night. Today has been feeling dizzy and nauseated on and off. Had a little light spotting last night, none today. Reports good fetal movement.

## 2012-01-18 NOTE — ED Provider Notes (Signed)
History     Chief Complaint  Patient presents with  . Abdominal Pain   HPI 60yr [id number] at 23.2wks presents with bilateral lower abd pain x1d Described at constant, sharp, worse with movement, bilateral with no radiation. Also has associated nausea, no vomiting, denies any discharge or bleeding. Denies any cramping or contractions. Denies any GI or GU complaints. Patient reports good fetal movement.   Patient receives prenatal care at [facility] due to Hx of PPROM with preterm delivery at approx 28wks, on 17-P. This pregnancy is uncomplicated otherwise.   OB History    Grav Para Term Preterm Abortions TAB SAB Ect Mult Living   3 1 0 1 1 0 1 0 0 1       Past Medical History  Diagnosis Date  . Chlamydia   . Trichomonas   . Preterm labor   . Headache   . UTI (lower urinary tract infection)     Past Surgical History  Procedure Date  . Cyst removed from l buttock   . Cesarean section   . Wisdom tooth extraction   . Wisdom tooth extraction 2009  . Cystectomy 2009    left buttock    Family History  Problem Relation Age of Onset  . Hypertension Mother     History  Substance Use Topics  . Smoking status: Never Smoker   . Smokeless tobacco: Never Used  . Alcohol Use: 0.5 oz/week    1 drink(s) per week     "Awhile back"    Allergies: No Known Allergies  Prescriptions prior to admission  Medication Sig Dispense Refill  . Prenatal Vit-Fe Fumarate-FA (PRENATAL/FOLIC ACID PO) Take by mouth daily.          Review of Systems  Constitutional: Negative for fever and chills.  Eyes: Negative for blurred vision and double vision.  Respiratory: Negative for shortness of breath.   Cardiovascular: Negative for chest pain.  Gastrointestinal: Positive for nausea and abdominal pain (see HPI). Negative for heartburn, vomiting, diarrhea and constipation.  Genitourinary: Negative for dysuria, urgency, frequency and hematuria.  Neurological: Positive for headaches.  All other systems  reviewed and are negative.   Physical Exam   Blood pressure 113/61, pulse 90, temperature 98.5 F (36.9 C), temperature source Oral, resp. rate 16, last menstrual period 08/20/2011, SpO2 98.00%, unknown if currently breastfeeding.  Physical Exam  Constitutional: She is oriented to person, place, and time. She appears well-developed and well-nourished.  HENT:  Head: Normocephalic and atraumatic.  Eyes: EOM are normal. Pupils are equal, round, and reactive to light.  Cardiovascular: Normal rate, regular rhythm, normal heart sounds and intact distal pulses.  Exam reveals no gallop and no friction rub.   No murmur heard. Respiratory: Effort normal and breath sounds normal. No respiratory distress. She has no wheezes. She has no rales. She exhibits no tenderness.  GI: Soft. Bowel sounds are normal. She exhibits no distension and no mass. There is no tenderness. There is no rebound and no guarding.  Musculoskeletal: She exhibits no edema and no tenderness.  Neurological: She is alert and oriented to person, place, and time. She has normal reflexes. No cranial nerve deficit.  Skin: Skin is warm and dry. No rash noted. No erythema. No pallor.  Psychiatric: She has a normal mood and affect. Her behavior is normal. Judgment and thought content normal.    MAU Course  Procedures  MDM 1730: initial assessment and exam. Patient to be placed on monitor, UA, given zofran with much relief. Will  perform speculum exam, await UA results, monitor for any contractions. FHR 150s, good fetal movement.   1806: cervical exam , cervix closed/thick/high, no contractions seen on monitor, obtained G&C and wet prep. Will await results. Anticipate DC to home with Rx for Zofran for nausea. Patient tolerating po liquids.  1850: UA mildly concerning for UTI, will culture and treat with Macrobid given Hx of preterm delivery. Also wet prep suggestive of BV, will give flagyl   Assessment and Plan  UTI in pregnancy  -  will treat with macrobid  Bacterial Vaginosis  - flagyl  - f/u G&C once resulted  Pregnancy, intrauterine  - Pt not in preterm labor at this time  - will DC to home, followup at next appointment   Cameron Proud 01/18/2012, 5:44 PM

## 2012-01-18 NOTE — Progress Notes (Signed)
Pt c/o sharp lower abd pain since last night.  Today she started with headache, nausea and dizziness.  States she drank a capri sun, a sweet tea and a bottle of water since 0800 this morning.

## 2012-01-19 LAB — GC/CHLAMYDIA PROBE AMP, GENITAL
Chlamydia, DNA Probe: NEGATIVE
GC Probe Amp, Genital: NEGATIVE

## 2012-01-28 ENCOUNTER — Ambulatory Visit (INDEPENDENT_AMBULATORY_CARE_PROVIDER_SITE_OTHER): Payer: Medicaid Other | Admitting: Obstetrics and Gynecology

## 2012-01-28 DIAGNOSIS — O09219 Supervision of pregnancy with history of pre-term labor, unspecified trimester: Secondary | ICD-10-CM

## 2012-02-04 ENCOUNTER — Ambulatory Visit (INDEPENDENT_AMBULATORY_CARE_PROVIDER_SITE_OTHER): Payer: Medicaid Other | Admitting: Obstetrics & Gynecology

## 2012-02-04 DIAGNOSIS — O09219 Supervision of pregnancy with history of pre-term labor, unspecified trimester: Secondary | ICD-10-CM

## 2012-02-04 DIAGNOSIS — Z98891 History of uterine scar from previous surgery: Secondary | ICD-10-CM

## 2012-02-04 DIAGNOSIS — O34219 Maternal care for unspecified type scar from previous cesarean delivery: Secondary | ICD-10-CM

## 2012-02-04 LAB — POCT URINALYSIS DIP (DEVICE)
Glucose, UA: NEGATIVE mg/dL
Hgb urine dipstick: NEGATIVE
Nitrite: NEGATIVE
Specific Gravity, Urine: 1.025 (ref 1.005–1.030)
Urobilinogen, UA: 1 mg/dL (ref 0.0–1.0)
pH: 7 (ref 5.0–8.0)

## 2012-02-04 NOTE — Patient Instructions (Signed)

## 2012-02-04 NOTE — Progress Notes (Signed)
No complaints. Plan CS at 36 weeks, doesn't plan BTL at this time

## 2012-02-04 NOTE — Progress Notes (Signed)
Edema-feet and hands. Needs Rx for UTI and yeast infection. Meds were not at the pharmacy. Pulse 95.

## 2012-02-11 ENCOUNTER — Ambulatory Visit: Payer: Self-pay

## 2012-02-18 ENCOUNTER — Ambulatory Visit (INDEPENDENT_AMBULATORY_CARE_PROVIDER_SITE_OTHER): Payer: Medicaid Other | Admitting: *Deleted

## 2012-02-18 DIAGNOSIS — O09219 Supervision of pregnancy with history of pre-term labor, unspecified trimester: Secondary | ICD-10-CM

## 2012-02-25 ENCOUNTER — Encounter: Payer: Self-pay | Admitting: Obstetrics and Gynecology

## 2012-02-25 ENCOUNTER — Ambulatory Visit (INDEPENDENT_AMBULATORY_CARE_PROVIDER_SITE_OTHER): Payer: Medicaid Other | Admitting: Physician Assistant

## 2012-02-25 VITALS — BP 136/82 | Temp 97.5°F | Wt 213.9 lb

## 2012-02-25 DIAGNOSIS — O09219 Supervision of pregnancy with history of pre-term labor, unspecified trimester: Secondary | ICD-10-CM

## 2012-02-25 LAB — POCT URINALYSIS DIP (DEVICE)
Bilirubin Urine: NEGATIVE
Hgb urine dipstick: NEGATIVE
Nitrite: NEGATIVE
Protein, ur: 30 mg/dL — AB
pH: 6.5 (ref 5.0–8.0)

## 2012-02-25 LAB — CBC
HCT: 26.6 % — ABNORMAL LOW (ref 36.0–46.0)
Hemoglobin: 7.9 g/dL — ABNORMAL LOW (ref 12.0–15.0)
MCH: 20 pg — ABNORMAL LOW (ref 26.0–34.0)
MCHC: 29.7 g/dL — ABNORMAL LOW (ref 30.0–36.0)
RDW: 18.4 % — ABNORMAL HIGH (ref 11.5–15.5)

## 2012-02-25 NOTE — Patient Instructions (Signed)
Pregnancy - Third Trimester The third trimester of pregnancy (the last 3 months) is a period of the most rapid growth for you and your baby. The baby approaches a length of 20 inches and a weight of 6 to 10 pounds. The baby is adding on fat and getting ready for life outside your body. While inside, babies have periods of sleeping and waking, suck their thumbs, and hiccups. You can often feel small contractions of the uterus. This is false labor. It is also called Braxton-Hicks contractions. This is like a practice for labor. The usual problems in this stage of pregnancy include more difficulty breathing, swelling of the hands and feet from water retention, and having to urinate more often because of the uterus and baby pressing on your bladder.  PRENATAL EXAMS  Blood work may continue to be done during prenatal exams. These tests are done to check on your health and the probable health of your baby. Blood work is used to follow your blood levels (hemoglobin). Anemia (low hemoglobin) is common during pregnancy. Iron and vitamins are given to help prevent this. You may also continue to be checked for diabetes. Some of the past blood tests may be done again.   The size of the uterus is measured during each visit. This makes sure your baby is growing properly according to your pregnancy dates.   Your blood pressure is checked every prenatal visit. This is to make sure you are not getting toxemia.   Your urine is checked every prenatal visit for infection, diabetes and protein.   Your weight is checked at each visit. This is done to make sure gains are happening at the suggested rate and that you and your baby are growing normally.   Sometimes, an ultrasound is performed to confirm the position and the proper growth and development of the baby. This is a test done that bounces harmless sound waves off the baby so your caregiver can more accurately determine due dates.   Discuss the type of pain  medication and anesthesia you will have during your labor and delivery.   Discuss the possibility and anesthesia if a Cesarean Section might be necessary.   Inform your caregiver if there is any mental or physical violence at home.  Sometimes, a specialized non-stress test, contraction stress test and biophysical profile are done to make sure the baby is not having a problem. Checking the amniotic fluid surrounding the baby is called an amniocentesis. The amniotic fluid is removed by sticking a needle into the belly (abdomen). This is sometimes done near the end of pregnancy if an early delivery is required. In this case, it is done to help make sure the baby's lungs are mature enough for the baby to live outside of the womb. If the lungs are not mature and it is unsafe to deliver the baby, an injection of cortisone medication is given to the mother 1 to 2 days before the delivery. This helps the baby's lungs mature and makes it safer to deliver the baby. CHANGES OCCURING IN THE THIRD TRIMESTER OF PREGNANCY Your body goes through many changes during pregnancy. They vary from person to person. Talk to your caregiver about changes you notice and are concerned about.  During the last trimester, you have probably had an increase in your appetite. It is normal to have cravings for certain foods. This varies from person to person and pregnancy to pregnancy.   You may begin to get stretch marks on your hips,   abdomen, and breasts. These are normal changes in the body during pregnancy. There are no exercises or medications to take which prevent this change.   Constipation may be treated with a stool softener or adding bulk to your diet. Drinking lots of fluids, fiber in vegetables, fruits, and whole grains are helpful.   Exercising is also helpful. If you have been very active up until your pregnancy, most of these activities can be continued during your pregnancy. If you have been less active, it is helpful  to start an exercise program such as walking. Consult your caregiver before starting exercise programs.   Avoid all smoking, alcohol, un-prescribed drugs, herbs and "street drugs" during your pregnancy. These chemicals affect the formation and growth of the baby. Avoid chemicals throughout the pregnancy to ensure the delivery of a healthy infant.   Backache, varicose veins and hemorrhoids may develop or get worse.   You will tire more easily in the third trimester, which is normal.   The baby's movements may be stronger and more often.   You may become short of breath easily.   Your belly button may stick out.   A yellow discharge may leak from your breasts called colostrum.   You may have a bloody mucus discharge. This usually occurs a few days to a week before labor begins.  HOME CARE INSTRUCTIONS   Keep your caregiver's appointments. Follow your caregiver's instructions regarding medication use, exercise, and diet.   During pregnancy, you are providing food for you and your baby. Continue to eat regular, well-balanced meals. Choose foods such as meat, fish, milk and other low fat dairy products, vegetables, fruits, and whole-grain breads and cereals. Your caregiver will tell you of the ideal weight gain.   A physical sexual relationship may be continued throughout pregnancy if there are no other problems such as early (premature) leaking of amniotic fluid from the membranes, vaginal bleeding, or belly (abdominal) pain.   Exercise regularly if there are no restrictions. Check with your caregiver if you are unsure of the safety of your exercises. Greater weight gain will occur in the last 2 trimesters of pregnancy. Exercising helps:   Control your weight.   Get you in shape for labor and delivery.   You lose weight after you deliver.   Rest a lot with legs elevated, or as needed for leg cramps or low back pain.   Wear a good support or jogging bra for breast tenderness during  pregnancy. This may help if worn during sleep. Pads or tissues may be used in the bra if you are leaking colostrum.   Do not use hot tubs, steam rooms, or saunas.   Wear your seat belt when driving. This protects you and your baby if you are in an accident.   Avoid raw meat, cat litter boxes and soil used by cats. These carry germs that can cause birth defects in the baby.   It is easier to loose urine during pregnancy. Tightening up and strengthening the pelvic muscles will help with this problem. You can practice stopping your urination while you are going to the bathroom. These are the same muscles you need to strengthen. It is also the muscles you would use if you were trying to stop from passing gas. You can practice tightening these muscles up 10 times a set and repeating this about 3 times per day. Once you know what muscles to tighten up, do not perform these exercises during urination. It is more likely   to cause an infection by backing up the urine.   Ask for help if you have financial, counseling or nutritional needs during pregnancy. Your caregiver will be able to offer counseling for these needs as well as refer you for other special needs.   Make a list of emergency phone numbers and have them available.   Plan on getting help from family or friends when you go home from the hospital.   Make a trial run to the hospital.   Take prenatal classes with the father to understand, practice and ask questions about the labor and delivery.   Prepare the baby's room/nursery.   Do not travel out of the city unless it is absolutely necessary and with the advice of your caregiver.   Wear only low or no heal shoes to have better balance and prevent falling.  MEDICATIONS AND DRUG USE IN PREGNANCY  Take prenatal vitamins as directed. The vitamin should contain 1 milligram of folic acid. Keep all vitamins out of reach of children. Only a couple vitamins or tablets containing iron may be fatal  to a baby or young child when ingested.   Avoid use of all medications, including herbs, over-the-counter medications, not prescribed or suggested by your caregiver. Only take over-the-counter or prescription medicines for pain, discomfort, or fever as directed by your caregiver. Do not use aspirin, ibuprofen (Motrin, Advil, Nuprin) or naproxen (Aleve) unless OK'd by your caregiver.   Let your caregiver also know about herbs you may be using.   Alcohol is related to a number of birth defects. This includes fetal alcohol syndrome. All alcohol, in any form, should be avoided completely. Smoking will cause low birth rate and premature babies.   Street/illegal drugs are very harmful to the baby. They are absolutely forbidden. A baby born to an addicted mother will be addicted at birth. The baby will go through the same withdrawal an adult does.  SEEK MEDICAL CARE IF: You have any concerns or worries during your pregnancy. It is better to call with your questions if you feel they cannot wait, rather than worry about them. DECISIONS ABOUT CIRCUMCISION You may or may not know the sex of your baby. If you know your baby is a boy, it may be time to think about circumcision. Circumcision is the removal of the foreskin of the penis. This is the skin that covers the sensitive end of the penis. There is no proven medical need for this. Often this decision is made on what is popular at the time or based upon religious beliefs and social issues. You can discuss these issues with your caregiver or pediatrician. SEEK IMMEDIATE MEDICAL CARE IF:   An unexplained oral temperature above 102 F (38.9 C) develops, or as your caregiver suggests.   You have leaking of fluid from the vagina (birth canal). If leaking membranes are suspected, take your temperature and tell your caregiver of this when you call.   There is vaginal spotting, bleeding or passing clots. Tell your caregiver of the amount and how many pads are  used.   You develop a bad smelling vaginal discharge with a change in the color from clear to white.   You develop vomiting that lasts more than 24 hours.   You develop chills or fever.   You develop shortness of breath.   You develop burning on urination.   You loose more than 2 pounds of weight or gain more than 2 pounds of weight or as suggested by your   caregiver.   You notice sudden swelling of your face, hands, and feet or legs.   You develop belly (abdominal) pain. Round ligament discomfort is a common non-cancerous (benign) cause of abdominal pain in pregnancy. Your caregiver still must evaluate you.   You develop a severe headache that does not go away.   You develop visual problems, blurred or double vision.   If you have not felt your baby move for more than 1 hour. If you think the baby is not moving as much as usual, eat something with sugar in it and lie down on your left side for an hour. The baby should move at least 4 to 5 times per hour. Call right away if your baby moves less than that.   You fall, are in a car accident or any kind of trauma.   There is mental or physical violence at home.  Document Released: 12/01/2001 Document Revised: 11/26/2011 Document Reviewed: 06/05/2009 [employer] Patient Information 2012 [employer]. 

## 2012-02-25 NOTE — Progress Notes (Signed)
Pt c/o boil on vagina "from shaving".  Pulse- 115 Needs Rx for UTI and yeast infection was never sent to pharmacy.

## 2012-02-25 NOTE — Progress Notes (Signed)
No complaints. +FM, no PTL s/s. 1 hour glucola today. 17-p. Referral to dentist.

## 2012-02-26 LAB — GLUCOSE TOLERANCE, 1 HOUR: Glucose, 1 Hour GTT: 93 mg/dL (ref 70–140)

## 2012-02-27 LAB — CULTURE, OB URINE

## 2012-03-03 ENCOUNTER — Ambulatory Visit: Payer: Self-pay

## 2012-03-10 ENCOUNTER — Ambulatory Visit (INDEPENDENT_AMBULATORY_CARE_PROVIDER_SITE_OTHER): Payer: Medicaid Other | Admitting: Obstetrics and Gynecology

## 2012-03-10 VITALS — BP 123/73 | Temp 98.1°F | Wt 215.7 lb

## 2012-03-10 DIAGNOSIS — O09219 Supervision of pregnancy with history of pre-term labor, unspecified trimester: Secondary | ICD-10-CM

## 2012-03-10 DIAGNOSIS — Z98891 History of uterine scar from previous surgery: Secondary | ICD-10-CM

## 2012-03-10 DIAGNOSIS — E669 Obesity, unspecified: Secondary | ICD-10-CM

## 2012-03-10 DIAGNOSIS — Z9889 Other specified postprocedural states: Secondary | ICD-10-CM

## 2012-03-10 DIAGNOSIS — O34219 Maternal care for unspecified type scar from previous cesarean delivery: Secondary | ICD-10-CM

## 2012-03-10 LAB — POCT URINALYSIS DIP (DEVICE)
Bilirubin Urine: NEGATIVE
Ketones, ur: NEGATIVE mg/dL
Specific Gravity, Urine: 1.02 (ref 1.005–1.030)

## 2012-03-10 NOTE — Progress Notes (Signed)
Edema-feet and hands. Pain on sides at times. Pulse 96.

## 2012-03-10 NOTE — Progress Notes (Signed)
Patient doing well without complaints. FM/PTL precautions reviewed. Information provided to surgical scheduler for delivery at [redacted]w[redacted]d (Monday 4/29). Patient may breastfeed initially and switch to formula when she returns to school

## 2012-03-11 ENCOUNTER — Encounter: Payer: Self-pay | Admitting: *Deleted

## 2012-03-14 ENCOUNTER — Encounter: Payer: Self-pay | Admitting: *Deleted

## 2012-03-14 ENCOUNTER — Encounter: Payer: Self-pay | Admitting: Obstetrics and Gynecology

## 2012-03-17 ENCOUNTER — Ambulatory Visit: Payer: Self-pay

## 2012-03-22 ENCOUNTER — Encounter (HOSPITAL_COMMUNITY): Payer: Self-pay | Admitting: *Deleted

## 2012-03-22 ENCOUNTER — Inpatient Hospital Stay (HOSPITAL_COMMUNITY)
Admission: AD | Admit: 2012-03-22 | Discharge: 2012-03-23 | Disposition: A | Discharge status: Home / Self Care | Payer: Medicaid Other | Source: Ambulatory Visit | Attending: Family Medicine | Admitting: Family Medicine

## 2012-03-22 DIAGNOSIS — O0990 Supervision of high risk pregnancy, unspecified, unspecified trimester: Secondary | ICD-10-CM

## 2012-03-22 DIAGNOSIS — O212 Late vomiting of pregnancy: Secondary | ICD-10-CM | POA: Insufficient documentation

## 2012-03-22 DIAGNOSIS — O219 Vomiting of pregnancy, unspecified: Secondary | ICD-10-CM

## 2012-03-22 DIAGNOSIS — R109 Unspecified abdominal pain: Secondary | ICD-10-CM | POA: Insufficient documentation

## 2012-03-22 DIAGNOSIS — O34219 Maternal care for unspecified type scar from previous cesarean delivery: Secondary | ICD-10-CM

## 2012-03-22 LAB — URINALYSIS, ROUTINE W REFLEX MICROSCOPIC
Nitrite: NEGATIVE
Specific Gravity, Urine: >1.03 — ABNORMAL HIGH (ref 1.005–1.030)
pH: 6.5 (ref 5.0–8.0)

## 2012-03-22 LAB — URINE MICROSCOPIC-ADD ON

## 2012-03-22 MED ORDER — ONDANSETRON 8 MG PO TBDP: 8.0000 mg | ORAL_TABLET | Freq: Three times a day (TID) | ORAL | Status: DC | PRN

## 2012-03-22 MED ORDER — ONDANSETRON 4 MG PO TBDP
4.0000 mg | ORAL_TABLET | Freq: Once | ORAL | Status: AC
  Administered 2012-03-22: 4 mg via ORAL
  Filled 2012-03-22: qty 1

## 2012-03-22 NOTE — ED Provider Notes (Signed)
History     CSN: [id number]  Arrival date and time: 03/22/12 2001   None     No chief complaint on file.  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 She denies fever and diarrhea but states that she is starting to feel dehydrated and reports some abdominal pain.   OB History    Grav Para Term Preterm Abortions TAB SAB Ect Mult Living   3 1 0 1 1 0 1 0 0 1       Past Medical History  Diagnosis Date  . Chlamydia   . Trichomonas   . Preterm labor   . Headache   . UTI (lower urinary tract infection)     Past Surgical History  Procedure Date  . Cyst removed from l buttock   . Cesarean section   . Wisdom tooth extraction   . Wisdom tooth extraction 2009  . Cystectomy 2009    left buttock    Family History  Problem Relation Age of Onset  . Hypertension Mother     History  Substance Use Topics  . Smoking status: Never Smoker   . Smokeless tobacco: Never Used  . Alcohol Use: 0.5 oz/week    1 drink(s) per week     "Awhile back"    Allergies: No Known Allergies  Prescriptions prior to admission  Medication Sig Dispense Refill  . Prenatal Vit-Fe Fumarate-FA (PRENATAL MULTIVITAMIN) TABS Take 1 tablet by mouth daily.        Review of Systems  All other systems reviewed and are negative.   Physical Exam   Blood pressure 122/71, pulse 104, temperature 99 F (37.2 C), temperature source Oral, resp. rate 20, height 5\' 9"  (1.753 m), weight 216 lb 4 oz (98.09 kg), last menstrual period 08/20/2011, unknown if currently breastfeeding.  Physical Exam  Constitutional: She appears well-developed and well-nourished. No distress.  HENT:       MMM.   Skin: Skin is warm and dry. She is not diaphoretic.   Results for orders placed during the hospital encounter of 03/22/12 (from the past 24 hour(s))  URINALYSIS, ROUTINE W REFLEX MICROSCOPIC     Status: Abnormal   Collection Time   03/22/12  8:45 PM      Component Value Range   Color, Urine YELLOW  YELLOW    APPearance CLEAR  CLEAR    Specific Gravity, Urine >1.030 (*) 1.005 - 1.030    pH 6.5  5.0 - 8.0    Glucose, UA NEGATIVE  NEGATIVE (mg/dL)   Hgb urine dipstick TRACE (*) NEGATIVE    Bilirubin Urine NEGATIVE  NEGATIVE    Ketones, ur 40 (*) NEGATIVE (mg/dL)   Protein, ur 30 (*) NEGATIVE (mg/dL)   Urobilinogen, UA 1.0  0.0 - 1.0 (mg/dL)   Nitrite NEGATIVE  NEGATIVE    Leukocytes, UA TRACE (*) NEGATIVE   URINE MICROSCOPIC-ADD ON     Status: Abnormal   Collection Time   03/22/12  8:45 PM      Component Value Range   Squamous Epithelial / LPF FEW (*) RARE    WBC, UA 3-6  <3 (WBC/hpf)   RBC / HPF 0-2  <3 (RBC/hpf)   Bacteria, UA MANY (*) RARE     MAU Course  Procedures   Assessment and Plan  1. IUD: [redacted]w[redacted]d  2. Nausea and Vomiting: Zofran ODT 8mg  po q8hrs PRN. Pt agreed to  return if symptoms worsen.    Iverson Alamin 03/22/2012, 11:18 PM   I have seen and examined this patient and I agree with the above. Cam Hai 12:19 AM 03/23/2012

## 2012-03-22 NOTE — MAU Note (Signed)
PT SAYS  SHE ATE AT [facility] AT 1PM THEN  FELT BAD - LAYED DOWN  THEN VOMITING.  ASKED COUSIN - SHE SICK TOO.- ATE AT [facility]   HAS CONTINUED TO VOMIT.    DENIES FEVER , NO COUGH

## 2012-03-23 NOTE — ED Provider Notes (Signed)
Chart reviewed and agree with management and plan.  

## 2012-03-23 NOTE — Progress Notes (Signed)
Pt states she  Feels sore from vomiting. Pt removed from the monitor by K. Clelia Croft CNM

## 2012-03-23 NOTE — Discharge Instructions (Signed)
Fetal Monitoring, Fetal Movement Assessment Fetal movement assessment (FMA) is done by the pregnant woman herself by counting and recording the baby's movements over a certain time period. It is done to see if there are problems with the pregnancy and the baby. Identifying and correcting problems may prevent serious problems from developing with the fetus, including fetal loss. Some pregnancies are complicated by the mother's medical problems. Some of these problems are type 1 diabetes mellitus, high blood pressure and other chronic medical illnesses. This is why it is important to monitor the baby before birth.  OTHER TECHNIQUES OF MONITORING YOUR BABY BEFORE BIRTH Several tests are in use. These include:  Nonstress test (NST). This test monitors the baby's heart rate when the baby moves.   Contraction stress test (CST). This test monitors the baby's heart rate during a contraction of the uterus.   Fetal biophysical profile (BPP). This measures and evaluates 5 observations of the baby:   The nonstress test.   The baby's breathing.   The baby's movements.   The baby's muscle tone.   The amount of amniotic fluid.   Modified BPP. This measures the volume of fluid in different parts of the amniotic sac (amniotic fluid index) and the results of the nonstress test.   Umbilical artery doppler velocimetry. This evaluates the blood flow through the umbilical cord.  There are several very serious problems that cannot be predicted or detected with any of the fetal monitoring procedures. These problems include separation (abruption) of the placenta or when the fetus chokes on the umbilical cord (umbilical cord accident). Your caregiver will help you understand your tests and what they mean for you and your baby. It is your responsibility to obtain the results of your test. LET YOUR CAREGIVER KNOW ABOUT:   Any medications you are taking including prescription and over-the-counter drugs, herbs, eye  drops and creams.   If you have a fever.   If you have an infection.   If you are sick.  RISKS AND COMPLICATIONS  There are no risks or complications to the mother or fetus with FMA. BEFORE THE PROCEDURE  Do not take medications that may decrease or increase the baby's heart rate and/or movements.   Eat a full meal at least 2 hours before the test.   Do not smoke if you are pregnant. If you smoke, stop at least 2 days before the test. It is best not to smoke at all when you are pregnant.  PROCEDURE Sometimes, a mother notices her baby moves less before there are problems. Because of this, it is believed that fetal movement checking by the mother (kick counts) is a good way to check the baby before birth. There are different ways of doing this. Two good ways are:  The woman lies on her side and counts distinct (individual) fetal movements. A feeling of 10 distinct movements in a period of up to 2 hours is considered reassuring. When 10 movements are felt, you may stop counting.   Women are instructed to count fetal movements for 1 hour, three times per week. The count is good if, after one week, it equals or is over the woman's previously established baseline count. If the count is lower, further checking of your baby is needed.  AFTER THE PROCEDURE You may resume your usual activities. HOME CARE INSTRUCTIONS   Follow your caregiver's advice and recommendations.   Be aware of your baby's movements. Are they normal, less than usual or more than usual?     Make and keep the rest of your prenatal appointments.  SEEK MEDICAL CARE IF:   You develop a temperature of 100 F (37.8 C) or higher.   You have a bloody mucus discharge from the vagina (a bloody show).  SEEK IMMEDIATE MEDICAL CARE IF:   You do not feel the baby move.   You think the baby's movements are too little or too many.   You develop contractions.   You develop vaginal bleeding.   You have belly (abdominal) pain.    You have leaking or a gush of fluid from the vagina.  Document Released: 11/27/2002 Document Revised: 11/26/2011 Document Reviewed: 04/01/2009 [facility] Patient Information 2012 [place], [place].Pregnancy - Third Trimester The third trimester begins at the 28th week of pregnancy and ends at birth. It is important to follow your doctor's instructions. HOME CARE   Go to your doctor's visits.   Do not smoke.   Do not drink alcohol or use drugs.   Only take medicine as told by your doctor.   Take prenatal vitamins as told. The vitamin should contain 1 milligram of folic acid.   Exercise.   Eat healthy foods. Eat regular, well-balanced meals.   You can have sex (intercourse) if there are no other problems with the pregnancy.   Do not use hot tubs, steam rooms, or saunas.   Wear a seat belt while driving.   Avoid raw meat, uncooked cheese, and litter boxes and soil used by cats.   Rest with your legs raised (elevated).   Make a list of emergency phone numbers. Keep this list with you.   Arrange for help when you come back home after delivering the baby.   Make a trial run to the hospital.   Take prenatal classes.   Prepare the baby's nursery.   Do not travel out of the city. If you absolutely have to, get permission from your doctor first.   Wear flat shoes. Do not wear high heels.  GET HELP RIGHT AWAY IF:   You have a temperature by mouth above 102 F (38.9 C), not controlled by medicine.   You have not felt the baby move for more than 1 hour. If you think the baby is not moving as much as normal, eat something with sugar in it or lie down on your left side for an hour. The baby should move at least 4 to 5 times per hour.   Fluid is coming from the vagina.   Blood is coming from the vagina. Light spotting is common, especially after sex (intercourse).   You have belly (abdominal) pain.   You have a bad smelling fluid (discharge) coming from the vagina. The fluid  changes from clear to white.   You still feel sick to your stomach (nauseous).   You throw up (vomit) for more than 24 hours.   You have the chills.   You have shortness of breath.   You have a burning feeling when you pee (urinate).   You lose or gain more than 2 pounds (0.9 kilograms) of weight over a week, or as told by your doctor.   Your face, hands, feet, or legs get puffy (swell).   You have a bad headache that will not go away.   You start to have problems seeing (blurry or double vision).   You fall, are in a car accident, or have any kind of trauma.   There is mental or physical violence at home.   You have  any concerns or worries during your pregnancy.  MAKE SURE YOU:   Understand these instructions.   Will watch your condition.   Will get help right away if you are not doing well or get worse.  Document Released: 03/03/2010 Document Revised: 11/26/2011 Document Reviewed: 03/03/2010 [facility] Patient Information 2012 [place], [place].

## 2012-03-24 ENCOUNTER — Ambulatory Visit (INDEPENDENT_AMBULATORY_CARE_PROVIDER_SITE_OTHER): Payer: Medicaid Other | Admitting: Obstetrics & Gynecology

## 2012-03-24 VITALS — BP 119/69 | Temp 98.4°F | Wt 215.4 lb

## 2012-03-24 DIAGNOSIS — O34219 Maternal care for unspecified type scar from previous cesarean delivery: Secondary | ICD-10-CM

## 2012-03-24 DIAGNOSIS — O09219 Supervision of pregnancy with history of pre-term labor, unspecified trimester: Secondary | ICD-10-CM

## 2012-03-24 LAB — POCT URINALYSIS DIP (DEVICE)
Glucose, UA: NEGATIVE mg/dL
Specific Gravity, Urine: >=1.03 (ref 1.005–1.030)

## 2012-03-24 NOTE — Progress Notes (Signed)
Edema-hands and feet. Pulse 102.

## 2012-03-24 NOTE — Progress Notes (Signed)
Continue weekly 17P.  No other complaints or concerns.  Fetal movement and labor precautions reviewed.  

## 2012-03-24 NOTE — Patient Instructions (Signed)
Return to clinic for any obstetric concerns or go to [facility] for evaluation  

## 2012-03-29 ENCOUNTER — Encounter (HOSPITAL_COMMUNITY): Payer: Self-pay | Admitting: Pharmacist

## 2012-03-31 ENCOUNTER — Ambulatory Visit: Payer: Self-pay

## 2012-04-01 ENCOUNTER — Ambulatory Visit (INDEPENDENT_AMBULATORY_CARE_PROVIDER_SITE_OTHER): Payer: Medicaid Other | Admitting: Obstetrics and Gynecology

## 2012-04-01 DIAGNOSIS — O09219 Supervision of pregnancy with history of pre-term labor, unspecified trimester: Secondary | ICD-10-CM

## 2012-04-07 ENCOUNTER — Encounter: Payer: Self-pay | Admitting: Obstetrics & Gynecology

## 2012-04-08 ENCOUNTER — Other Ambulatory Visit (INDEPENDENT_AMBULATORY_CARE_PROVIDER_SITE_OTHER): Payer: Medicaid Other | Admitting: *Deleted

## 2012-04-08 VITALS — BP 142/81 | HR 109 | Temp 99.2°F

## 2012-04-08 DIAGNOSIS — O09219 Supervision of pregnancy with history of pre-term labor, unspecified trimester: Secondary | ICD-10-CM

## 2012-04-11 ENCOUNTER — Encounter (HOSPITAL_COMMUNITY): Payer: Self-pay

## 2012-04-11 ENCOUNTER — Encounter (HOSPITAL_COMMUNITY)
Admission: RE | Admit: 2012-04-11 | Discharge: 2012-04-11 | Disposition: A | Discharge status: Home / Self Care | Payer: Medicaid Other | Source: Ambulatory Visit | Attending: Obstetrics & Gynecology | Admitting: Obstetrics & Gynecology

## 2012-04-11 DIAGNOSIS — O34219 Maternal care for unspecified type scar from previous cesarean delivery: Secondary | ICD-10-CM

## 2012-04-11 LAB — CBC
Hemoglobin: 7.5 g/dL — ABNORMAL LOW (ref 12.0–15.0)
MCH: 18.4 pg — ABNORMAL LOW (ref 26.0–34.0)
MCV: 63.7 fL — ABNORMAL LOW (ref 78.0–100.0)
RBC: 4.08 MIL/uL (ref 3.87–5.11)

## 2012-04-11 NOTE — Patient Instructions (Addendum)
20 Zynia N Carie  04/11/2012   Your procedure is scheduled on:  04/18/12  Enter through the Main Entrance of [facility] at 8 AM.  Pick up the phone at the desk and dial [phone].   Call this number if you have problems the morning of surgery: [phone]   Remember:   Do not eat food:After Midnight.  Do not drink clear liquids: After Midnight.  Take these medicines the morning of surgery with A SIP OF WATER: NA   Do not wear jewelry, make-up or nail polish.  Do not wear lotions, powders, or perfumes. You may wear deodorant.  Do not shave 48 hours prior to surgery.  Do not bring valuables to the hospital.  Contacts, dentures or bridgework may not be worn into surgery.  Leave suitcase in the car. After surgery it may be brought to your room.  For patients admitted to the hospital, checkout time is 11:00 AM the day of discharge.   Patients discharged the day of surgery will not be allowed to drive home.  Name and phone number of your driver: NA  Special Instructions: CHG Shower Use Special Wash: 1/2 bottle night before surgery and 1/2 bottle morning of surgery.   Please read over the following fact sheets that you were given: MRSA Information

## 2012-04-14 ENCOUNTER — Encounter: Payer: Self-pay | Admitting: Obstetrics and Gynecology

## 2012-04-14 ENCOUNTER — Ambulatory Visit (INDEPENDENT_AMBULATORY_CARE_PROVIDER_SITE_OTHER): Payer: Self-pay | Admitting: Obstetrics and Gynecology

## 2012-04-14 VITALS — BP 114/68 | Temp 98.9°F | Wt 214.5 lb

## 2012-04-14 DIAGNOSIS — O34219 Maternal care for unspecified type scar from previous cesarean delivery: Secondary | ICD-10-CM

## 2012-04-14 DIAGNOSIS — O09219 Supervision of pregnancy with history of pre-term labor, unspecified trimester: Secondary | ICD-10-CM

## 2012-04-14 DIAGNOSIS — E669 Obesity, unspecified: Secondary | ICD-10-CM

## 2012-04-14 LAB — POCT URINALYSIS DIP (DEVICE)
Bilirubin Urine: NEGATIVE
Glucose, UA: NEGATIVE mg/dL
Hgb urine dipstick: NEGATIVE
Ketones, ur: NEGATIVE mg/dL
Nitrite: NEGATIVE
Specific Gravity, Urine: 1.02 (ref 1.005–1.030)

## 2012-04-14 NOTE — Progress Notes (Signed)
Patient doing well without complaints. FM/labor precautions reviewed. Patient scheduled for rpt c-section on 4/29 due to previous classical c-section. Patient to receive 17P today

## 2012-04-14 NOTE — Progress Notes (Signed)
Pulse: 89

## 2012-04-17 ENCOUNTER — Inpatient Hospital Stay (HOSPITAL_COMMUNITY)
Admission: RE | Admit: 2012-04-17 | Discharge: 2012-04-21 | DRG: 766 | Disposition: A | Discharge status: Home / Self Care | Payer: Medicaid Other | Source: Ambulatory Visit | Attending: Obstetrics & Gynecology | Admitting: Obstetrics & Gynecology

## 2012-04-17 DIAGNOSIS — L910 Hypertrophic scar: Secondary | ICD-10-CM | POA: Diagnosis present

## 2012-04-17 DIAGNOSIS — Z01812 Encounter for preprocedural laboratory examination: Secondary | ICD-10-CM

## 2012-04-17 DIAGNOSIS — O34219 Maternal care for unspecified type scar from previous cesarean delivery: Secondary | ICD-10-CM

## 2012-04-17 DIAGNOSIS — Z98891 History of uterine scar from previous surgery: Secondary | ICD-10-CM | POA: Diagnosis not present

## 2012-04-17 DIAGNOSIS — E669 Obesity, unspecified: Secondary | ICD-10-CM | POA: Diagnosis present

## 2012-04-17 DIAGNOSIS — D509 Iron deficiency anemia, unspecified: Secondary | ICD-10-CM | POA: Diagnosis present

## 2012-04-17 DIAGNOSIS — O99214 Obesity complicating childbirth: Secondary | ICD-10-CM | POA: Diagnosis present

## 2012-04-17 DIAGNOSIS — O9902 Anemia complicating childbirth: Principal | ICD-10-CM | POA: Diagnosis present

## 2012-04-17 DIAGNOSIS — Z01818 Encounter for other preprocedural examination: Secondary | ICD-10-CM

## 2012-04-17 MED ORDER — DIPHENHYDRAMINE HCL 25 MG PO CAPS
25.0000 mg | ORAL_CAPSULE | Freq: Once | ORAL | Status: AC
  Administered 2012-04-17: 25 mg via ORAL
  Filled 2012-04-17: qty 1

## 2012-04-17 MED ORDER — MUPIROCIN 2 % EX OINT
TOPICAL_OINTMENT | Freq: Two times a day (BID) | CUTANEOUS | Status: DC
  Administered 2012-04-18: via NASAL
  Filled 2012-04-17: qty 22

## 2012-04-17 MED ORDER — ACETAMINOPHEN 325 MG PO TABS
650.0000 mg | ORAL_TABLET | Freq: Once | ORAL | Status: AC
  Administered 2012-04-17: 650 mg via ORAL
  Filled 2012-04-17: qty 2

## 2012-04-17 MED ORDER — CEFAZOLIN SODIUM-DEXTROSE 2-3 GM-% IV SOLR
2.0000 g | INTRAVENOUS | Status: AC
  Administered 2012-04-18: 2 g via INTRAVENOUS
  Filled 2012-04-17: qty 50

## 2012-04-17 NOTE — H&P (Signed)
Beth Gibson is a 26 y.o. female presenting for preop administration of 2 units of RBC's for Iron-def. Anemia and preop for C-Section tomorrow morning. She has no current symptoms. No recent infections. No allergic reactions to blood products in the past. No problems with surgery in the past. 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  History OB History    Grav Para Term Preterm Abortions TAB SAB Ect Mult Living   3 1 0 1 1 0 1 0 0 1      Past Medical History  Diagnosis Date  . Chlamydia   . Trichomonas   . Preterm labor   . Headache   . UTI (lower urinary tract infection)    Past Surgical History  Procedure Date  . Cyst removed from l buttock   . Cesarean section   . Wisdom tooth extraction   . Wisdom tooth extraction 2009  . Cystectomy 2009    left buttock   Family History: family history includes Hypertension in her mother. Social History:  reports that she has never smoked. She has never used smokeless tobacco. She reports that she drinks about .5 ounces of alcohol per week. She reports that she uses illicit drugs (Marijuana) about 3 times per week.  ROS Pertinent items are noted in HPI.    Last menstrual period 08/20/2011. Exam Physical Exam  Gen: Patient is comfortable, sitting up, conversing normally.  Lungs:  Normal respiratory effort, chest expands symmetrically. Lungs are clear to auscultation, no crackles or wheezes. Heart - Regular rate and rhythm.  Flow murmur present, no gallops or rubs.    Abdomen: soft and non-tender without masses, organomegaly or hernias noted.  No guarding or rebound. Gravid. Skin:  Intact without suspicious lesions or rashes Extremities:   Non-tender, No cyanosis, edema, or deformity noted.  Prenatal labs: ABO, Rh: B/Positive/-- (12/10 0000) Antibody: Negative (12/10 0000) Rubella: Equivocal (12/10 0000) RPR: NON REACTIVE (04/22 0933)  HBsAg: Negative (12/10 0000)  HIV: Non-reactive (12/10 0000)  GBS:   Unknown  Assessment/Plan: 26 y/o aaf for  2 units of blood preop c-section @ 36.1 weeks for previous classical C-section  1. Anemia - 2 units  - tylenol and benadryl  2. C-Section - ancef on call to OR - Npo after midnight - bactroban to nares    Analleli Gierke MD 04/17/2012, 10:25 PM  Discussed case with Dr. Marice Potter.

## 2012-04-18 ENCOUNTER — Encounter (HOSPITAL_COMMUNITY): Payer: Self-pay | Admitting: Anesthesiology

## 2012-04-18 ENCOUNTER — Encounter (HOSPITAL_COMMUNITY): Payer: Self-pay | Admitting: *Deleted

## 2012-04-18 ENCOUNTER — Encounter (HOSPITAL_COMMUNITY)
Admission: RE | Disposition: A | Discharge status: Home / Self Care | Payer: Self-pay | Source: Ambulatory Visit | Attending: Obstetrics & Gynecology

## 2012-04-18 ENCOUNTER — Inpatient Hospital Stay (HOSPITAL_COMMUNITY): Payer: Medicaid Other | Admitting: Anesthesiology

## 2012-04-18 DIAGNOSIS — D509 Iron deficiency anemia, unspecified: Secondary | ICD-10-CM

## 2012-04-18 DIAGNOSIS — O9902 Anemia complicating childbirth: Secondary | ICD-10-CM

## 2012-04-18 DIAGNOSIS — O34219 Maternal care for unspecified type scar from previous cesarean delivery: Secondary | ICD-10-CM

## 2012-04-18 DIAGNOSIS — E669 Obesity, unspecified: Secondary | ICD-10-CM

## 2012-04-18 DIAGNOSIS — O99214 Obesity complicating childbirth: Secondary | ICD-10-CM

## 2012-04-18 SURGERY — Surgical Case
Anesthesia: Epidural | Site: Abdomen | Wound class: Clean Contaminated

## 2012-04-18 MED ORDER — NALOXONE HCL 0.4 MG/ML IJ SOLN
INTRAMUSCULAR | Status: AC
  Filled 2012-04-18: qty 1

## 2012-04-18 MED ORDER — OXYTOCIN 10 UNIT/ML IJ SOLN
INTRAMUSCULAR | Status: AC
  Filled 2012-04-18: qty 4

## 2012-04-18 MED ORDER — IBUPROFEN 600 MG PO TABS
600.0000 mg | ORAL_TABLET | Freq: Four times a day (QID) | ORAL | Status: DC
  Administered 2012-04-19 – 2012-04-21 (×9): 600 mg via ORAL
  Filled 2012-04-18 (×3): qty 1

## 2012-04-18 MED ORDER — SIMETHICONE 80 MG PO CHEW
80.0000 mg | CHEWABLE_TABLET | Freq: Three times a day (TID) | ORAL | Status: DC
  Administered 2012-04-18 – 2012-04-21 (×10): 80 mg via ORAL

## 2012-04-18 MED ORDER — SCOPOLAMINE 1 MG/3DAYS TD PT72
1.0000 | MEDICATED_PATCH | Freq: Once | TRANSDERMAL | Status: AC
  Administered 2012-04-18: 1.5 mg via TRANSDERMAL

## 2012-04-18 MED ORDER — NALBUPHINE HCL 10 MG/ML IJ SOLN
5.0000 mg | INTRAMUSCULAR | Status: DC | PRN
  Administered 2012-04-18 – 2012-04-19 (×2): 10 mg via INTRAVENOUS
  Filled 2012-04-18 (×3): qty 1

## 2012-04-18 MED ORDER — ACETAMINOPHEN 325 MG PO TABS
650.0000 mg | ORAL_TABLET | Freq: Once | ORAL | Status: AC
  Administered 2012-04-18: 650 mg via ORAL
  Filled 2012-04-18: qty 2

## 2012-04-18 MED ORDER — OXYTOCIN 20 UNITS IN LACTATED RINGERS INFUSION - SIMPLE
125.0000 mL/h | INTRAVENOUS | Status: AC
  Administered 2012-04-18: 125 mL/h via INTRAVENOUS

## 2012-04-18 MED ORDER — OXYTOCIN 10 UNIT/ML IJ SOLN
INTRAMUSCULAR | Status: AC
  Filled 2012-04-18: qty 2

## 2012-04-18 MED ORDER — LACTATED RINGERS IV SOLN
INTRAVENOUS | Status: DC | PRN
  Administered 2012-04-18 (×2): via INTRAVENOUS

## 2012-04-18 MED ORDER — OXYCODONE-ACETAMINOPHEN 5-325 MG PO TABS
1.0000 | ORAL_TABLET | ORAL | Status: DC | PRN
  Administered 2012-04-19: 2 via ORAL
  Administered 2012-04-19: 1 via ORAL
  Administered 2012-04-19 (×2): 2 via ORAL
  Administered 2012-04-19: 1 via ORAL
  Administered 2012-04-19 – 2012-04-21 (×6): 2 via ORAL
  Filled 2012-04-18 (×4): qty 2
  Filled 2012-04-18: qty 1
  Filled 2012-04-18 (×2): qty 2
  Filled 2012-04-18: qty 1
  Filled 2012-04-18 (×3): qty 2

## 2012-04-18 MED ORDER — NALOXONE HCL 0.4 MG/ML IJ SOLN: 0.4000 mg | INTRAMUSCULAR | Status: DC | PRN

## 2012-04-18 MED ORDER — METHYLERGONOVINE MALEATE 0.2 MG/ML IJ SOLN
INTRAMUSCULAR | Status: AC
  Filled 2012-04-18: qty 1

## 2012-04-18 MED ORDER — MENTHOL 3 MG MT LOZG: 1.0000 | LOZENGE | OROMUCOSAL | Status: DC | PRN

## 2012-04-18 MED ORDER — ONDANSETRON HCL 4 MG/2ML IJ SOLN
INTRAMUSCULAR | Status: AC
  Filled 2012-04-18: qty 2

## 2012-04-18 MED ORDER — NALBUPHINE HCL 10 MG/ML IJ SOLN
5.0000 mg | INTRAMUSCULAR | Status: DC | PRN
  Administered 2012-04-18 – 2012-04-19 (×2): 10 mg via SUBCUTANEOUS
  Filled 2012-04-18 (×2): qty 1

## 2012-04-18 MED ORDER — ONDANSETRON HCL 4 MG/2ML IJ SOLN: 4.0000 mg | Freq: Three times a day (TID) | INTRAMUSCULAR | Status: DC | PRN

## 2012-04-18 MED ORDER — DIPHENHYDRAMINE HCL 25 MG PO CAPS
25.0000 mg | ORAL_CAPSULE | ORAL | Status: DC | PRN
  Administered 2012-04-18 – 2012-04-19 (×3): 25 mg via ORAL
  Filled 2012-04-18 (×5): qty 1

## 2012-04-18 MED ORDER — MORPHINE SULFATE (PF) 0.5 MG/ML IJ SOLN
INTRAMUSCULAR | Status: DC | PRN
Start: 2012-04-18 — End: 2012-04-18
  Administered 2012-04-18: .15 mg via INTRATHECAL
  Administered 2012-04-18: .35 mg via EPIDURAL

## 2012-04-18 MED ORDER — KETOROLAC TROMETHAMINE 30 MG/ML IJ SOLN
30.0000 mg | Freq: Four times a day (QID) | INTRAMUSCULAR | Status: AC | PRN
  Administered 2012-04-18: 30 mg via INTRAVENOUS
  Filled 2012-04-18: qty 1

## 2012-04-18 MED ORDER — DIPHENHYDRAMINE HCL 50 MG/ML IJ SOLN
12.5000 mg | INTRAMUSCULAR | Status: DC | PRN
  Administered 2012-04-18: 12.5 mg via INTRAVENOUS

## 2012-04-18 MED ORDER — SODIUM CHLORIDE 0.9 % IV SOLN
INTRAVENOUS | Status: DC | PRN
  Administered 2012-04-18 (×2): via INTRAVENOUS

## 2012-04-18 MED ORDER — CEFAZOLIN SODIUM 1-5 GM-% IV SOLN
INTRAVENOUS | Status: AC
  Filled 2012-04-18: qty 100

## 2012-04-18 MED ORDER — KETOROLAC TROMETHAMINE 30 MG/ML IJ SOLN
30.0000 mg | Freq: Four times a day (QID) | INTRAMUSCULAR | Status: AC | PRN
  Administered 2012-04-18: 30 mg via INTRAMUSCULAR

## 2012-04-18 MED ORDER — BUPIVACAINE IN DEXTROSE 0.75-8.25 % IT SOLN
INTRATHECAL | Status: DC | PRN
  Administered 2012-04-18: .8 mL via INTRATHECAL

## 2012-04-18 MED ORDER — METHYLERGONOVINE MALEATE 0.2 MG/ML IJ SOLN
INTRAMUSCULAR | Status: DC | PRN
  Administered 2012-04-18: 0.2 mg via INTRAMUSCULAR

## 2012-04-18 MED ORDER — OXYTOCIN 10 UNIT/ML IJ SOLN
INTRAMUSCULAR | Status: DC | PRN
  Administered 2012-04-18: 40 [IU] via INTRAMUSCULAR

## 2012-04-18 MED ORDER — SCOPOLAMINE 1 MG/3DAYS TD PT72
MEDICATED_PATCH | TRANSDERMAL | Status: AC
  Filled 2012-04-18: qty 1

## 2012-04-18 MED ORDER — MEPERIDINE HCL 25 MG/ML IJ SOLN: 6.2500 mg | INTRAMUSCULAR | Status: DC | PRN

## 2012-04-18 MED ORDER — SIMETHICONE 80 MG PO CHEW: 80.0000 mg | CHEWABLE_TABLET | ORAL | Status: DC | PRN

## 2012-04-18 MED ORDER — ONDANSETRON HCL 4 MG PO TABS: 4.0000 mg | ORAL_TABLET | ORAL | Status: DC | PRN

## 2012-04-18 MED ORDER — DIBUCAINE 1 % RE OINT: 1.0000 "application " | TOPICAL_OINTMENT | RECTAL | Status: DC | PRN

## 2012-04-18 MED ORDER — FENTANYL CITRATE 0.05 MG/ML IJ SOLN
INTRAMUSCULAR | Status: AC
  Filled 2012-04-18: qty 2

## 2012-04-18 MED ORDER — SODIUM CHLORIDE 0.9 % IJ SOLN: 3.0000 mL | INTRAMUSCULAR | Status: DC | PRN

## 2012-04-18 MED ORDER — MORPHINE SULFATE 0.5 MG/ML IJ SOLN
INTRAMUSCULAR | Status: AC
  Filled 2012-04-18: qty 10

## 2012-04-18 MED ORDER — METOCLOPRAMIDE HCL 5 MG/ML IJ SOLN: 10.0000 mg | Freq: Three times a day (TID) | INTRAMUSCULAR | Status: DC | PRN

## 2012-04-18 MED ORDER — PRENATAL MULTIVITAMIN CH
1.0000 | ORAL_TABLET | Freq: Every day | ORAL | Status: DC
  Administered 2012-04-19 – 2012-04-21 (×3): 1 via ORAL
  Filled 2012-04-18 (×3): qty 1

## 2012-04-18 MED ORDER — ONDANSETRON HCL 4 MG/2ML IJ SOLN
INTRAMUSCULAR | Status: DC | PRN
  Administered 2012-04-18: 4 mg via INTRAVENOUS

## 2012-04-18 MED ORDER — IBUPROFEN 600 MG PO TABS
600.0000 mg | ORAL_TABLET | Freq: Four times a day (QID) | ORAL | Status: DC | PRN
  Filled 2012-04-18 (×6): qty 1

## 2012-04-18 MED ORDER — FENTANYL CITRATE 0.05 MG/ML IJ SOLN
25.0000 ug | INTRAMUSCULAR | Status: DC | PRN
  Administered 2012-04-18 (×2): 50 ug via INTRAVENOUS

## 2012-04-18 MED ORDER — 0.9 % SODIUM CHLORIDE (POUR BTL) OPTIME
TOPICAL | Status: DC | PRN
  Administered 2012-04-18: 1000 mL

## 2012-04-18 MED ORDER — NALBUPHINE SYRINGE 5 MG/0.5 ML
INJECTION | INTRAMUSCULAR | Status: AC
  Administered 2012-04-19: 10 mg via SUBCUTANEOUS
  Filled 2012-04-18: qty 1

## 2012-04-18 MED ORDER — DIPHENHYDRAMINE HCL 25 MG PO CAPS
25.0000 mg | ORAL_CAPSULE | Freq: Once | ORAL | Status: AC
  Administered 2012-04-18: 25 mg via ORAL
  Filled 2012-04-18: qty 1

## 2012-04-18 MED ORDER — TETANUS-DIPHTH-ACELL PERTUSSIS 5-2.5-18.5 LF-MCG/0.5 IM SUSP
0.5000 mL | Freq: Once | INTRAMUSCULAR | Status: AC
  Administered 2012-04-19: 0.5 mL via INTRAMUSCULAR
  Filled 2012-04-18: qty 0.5

## 2012-04-18 MED ORDER — SENNOSIDES-DOCUSATE SODIUM 8.6-50 MG PO TABS
2.0000 | ORAL_TABLET | Freq: Every day | ORAL | Status: DC
  Administered 2012-04-18 – 2012-04-20 (×3): 2 via ORAL

## 2012-04-18 MED ORDER — ZOLPIDEM TARTRATE 5 MG PO TABS: 5.0000 mg | ORAL_TABLET | Freq: Every evening | ORAL | Status: DC | PRN

## 2012-04-18 MED ORDER — DIPHENHYDRAMINE HCL 50 MG/ML IJ SOLN
INTRAMUSCULAR | Status: AC
  Administered 2012-04-18: 12.5 mg via INTRAVENOUS
  Filled 2012-04-18: qty 1

## 2012-04-18 MED ORDER — DIPHENHYDRAMINE HCL 25 MG PO CAPS: 25.0000 mg | ORAL_CAPSULE | Freq: Four times a day (QID) | ORAL | Status: DC | PRN

## 2012-04-18 MED ORDER — ONDANSETRON HCL 4 MG/2ML IJ SOLN: 4.0000 mg | INTRAMUSCULAR | Status: DC | PRN

## 2012-04-18 MED ORDER — KETOROLAC TROMETHAMINE 30 MG/ML IJ SOLN
INTRAMUSCULAR | Status: AC
  Administered 2012-04-18: 30 mg via INTRAMUSCULAR
  Filled 2012-04-18: qty 1

## 2012-04-18 MED ORDER — FENTANYL CITRATE 0.05 MG/ML IJ SOLN
INTRAMUSCULAR | Status: DC | PRN
  Administered 2012-04-18 (×3): 25 ug via INTRAVENOUS
  Administered 2012-04-18: 25 ug via INTRATHECAL

## 2012-04-18 MED ORDER — MORPHINE SULFATE 4 MG/ML IJ SOLN
4.0000 mg | Freq: Once | INTRAMUSCULAR | Status: AC
  Administered 2012-04-18: 4 mg via INTRAVENOUS
  Filled 2012-04-18: qty 1

## 2012-04-18 MED ORDER — LACTATED RINGERS IV SOLN
INTRAVENOUS | Status: DC
  Administered 2012-04-18: 22:00:00 via INTRAVENOUS

## 2012-04-18 MED ORDER — DIPHENHYDRAMINE HCL 50 MG/ML IJ SOLN: 25.0000 mg | INTRAMUSCULAR | Status: DC | PRN

## 2012-04-18 MED ORDER — PHENYLEPHRINE 40 MCG/ML (10ML) SYRINGE FOR IV PUSH (FOR BLOOD PRESSURE SUPPORT)
PREFILLED_SYRINGE | INTRAVENOUS | Status: AC
  Filled 2012-04-18: qty 5

## 2012-04-18 MED ORDER — OXYTOCIN 20 UNITS IN LACTATED RINGERS INFUSION - SIMPLE
INTRAVENOUS | Status: AC
  Administered 2012-04-18: 125 mL/h via INTRAVENOUS
  Filled 2012-04-18: qty 1000

## 2012-04-18 MED ORDER — WITCH HAZEL-GLYCERIN EX PADS: 1.0000 "application " | MEDICATED_PAD | CUTANEOUS | Status: DC | PRN

## 2012-04-18 MED ORDER — LANOLIN HYDROUS EX OINT: 1.0000 "application " | TOPICAL_OINTMENT | CUTANEOUS | Status: DC | PRN

## 2012-04-18 MED ORDER — EPHEDRINE 5 MG/ML INJ
INTRAVENOUS | Status: AC
  Filled 2012-04-18: qty 10

## 2012-04-18 MED ORDER — SODIUM CHLORIDE 0.9 % IV SOLN
1.0000 ug/kg/h | INTRAVENOUS | Status: DC | PRN
  Administered 2012-04-18: 5.176 ug/kg/h via INTRAVENOUS
  Filled 2012-04-18: qty 2.5

## 2012-04-18 SURGICAL SUPPLY — 36 items
APL SKNCLS STERI-STRIP NONHPOA (GAUZE/BANDAGES/DRESSINGS) ×1
BENZOIN TINCTURE PRP APPL 2/3 (GAUZE/BANDAGES/DRESSINGS) ×1 IMPLANT
CHLORAPREP W/TINT 26ML (MISCELLANEOUS) ×3 IMPLANT
CLOTH BEACON ORANGE TIMEOUT ST (SAFETY) ×2 IMPLANT
CONTAINER PREFILL 10% NBF 15ML (MISCELLANEOUS) IMPLANT
DRAIN JACKSON PRT FLT 7MM (DRAIN) IMPLANT
DRESSING TELFA 8X3 (GAUZE/BANDAGES/DRESSINGS) ×1 IMPLANT
ELECT REM PT RETURN 9FT ADLT (ELECTROSURGICAL) ×2
ELECTRODE REM PT RTRN 9FT ADLT (ELECTROSURGICAL) ×1 IMPLANT
EVACUATOR SILICONE 100CC (DRAIN) IMPLANT
EXTRACTOR VACUUM M CUP 4 TUBE (SUCTIONS) IMPLANT
GAUZE SPONGE 4X4 12PLY STRL LF (GAUZE/BANDAGES/DRESSINGS) ×3 IMPLANT
GLOVE BIO SURGEON STRL SZ7 (GLOVE) ×2 IMPLANT
GLOVE BIOGEL PI IND STRL 7.0 (GLOVE) ×2 IMPLANT
GLOVE BIOGEL PI INDICATOR 7.0 (GLOVE) ×2
GLOVE SURG SS PI 7.0 STRL IVOR (GLOVE) ×1 IMPLANT
GLOVE SURG SS PI 7.5 STRL IVOR (GLOVE) ×1 IMPLANT
GOWN PREVENTION PLUS LG XLONG (DISPOSABLE) ×6 IMPLANT
KIT ABG SYR 3ML LUER SLIP (SYRINGE) IMPLANT
NDL HYPO 25X5/8 SAFETYGLIDE (NEEDLE) ×1 IMPLANT
NEEDLE HYPO 25X5/8 SAFETYGLIDE (NEEDLE) ×2 IMPLANT
NS IRRIG 1000ML POUR BTL (IV SOLUTION) ×2 IMPLANT
PACK C SECTION WH (CUSTOM PROCEDURE TRAY) ×2 IMPLANT
PAD ABD 7.5X8 STRL (GAUZE/BANDAGES/DRESSINGS) ×1 IMPLANT
RTRCTR C-SECT PINK 25CM LRG (MISCELLANEOUS) ×2 IMPLANT
SLEEVE SCD COMPRESS KNEE MED (MISCELLANEOUS) IMPLANT
STAPLER VISISTAT 35W (STAPLE) IMPLANT
STRIP CLOSURE SKIN 1/2X4 (GAUZE/BANDAGES/DRESSINGS) ×1 IMPLANT
SUT PLAIN 2 0 XLH (SUTURE) ×1 IMPLANT
SUT VIC AB 0 CTX 36 (SUTURE) ×10
SUT VIC AB 0 CTX36XBRD ANBCTRL (SUTURE) ×5 IMPLANT
SUT VIC AB 4-0 KS 27 (SUTURE) ×1 IMPLANT
TAPE CLOTH SURG 4X10 WHT LF (GAUZE/BANDAGES/DRESSINGS) ×1 IMPLANT
TOWEL OR 17X24 6PK STRL BLUE (TOWEL DISPOSABLE) ×4 IMPLANT
TRAY FOLEY CATH 14FR (SET/KITS/TRAYS/PACK) ×2 IMPLANT
WATER STERILE IRR 1000ML POUR (IV SOLUTION) ×2 IMPLANT

## 2012-04-18 NOTE — Addendum Note (Signed)
Addendum  created 04/18/12 1406 by Tyrone Apple. Malen Gauze, MD   Modules edited:Orders

## 2012-04-18 NOTE — Transfer of Care (Signed)
Immediate Anesthesia Transfer of Care Note  Patient: Beth Gibson  Procedure(s) Performed: Procedure(s) (LRB): CESAREAN SECTION (N/A)  Patient Location: PACU  Anesthesia Type: Spinal  Level of Consciousness: awake, alert  and oriented  Airway & Oxygen Therapy: Patient Spontanous Breathing  Post-op Assessment: Report given to PACU RN and Post -op Vital signs reviewed and stable  Post vital signs: Reviewed and stable  Complications: No apparent anesthesia complications

## 2012-04-18 NOTE — Progress Notes (Signed)
Patient continues to c/o severe itching.  Sitting upright in bed to scratch face, back and torso.  Requesting additional medication for itching.  Anesthesia(Dr. Malen Gauze) called and order received to add Narcan to IVF and run at 125/hr.  Pharmacy called.  Cannot add Narcan to current IVF.  Phone call to Dr. Malen Gauze to discuss plan.  Awaiting callback.

## 2012-04-18 NOTE — Progress Notes (Signed)
UR chart review completed.  

## 2012-04-18 NOTE — Progress Notes (Signed)
Dr Rivka Safer notified that lab reports may be 0400 before blood is ready for patient.  MD instructed RN to run each unit over 2 hours so will be infused prior to surgery.

## 2012-04-18 NOTE — Progress Notes (Signed)
Patient ID: Beth Gibson, female   DOB: Dec 21, 1986, 26 y.o.   MRN: [id number]  FACULTY PRACTICE ANTEPARTUM(COMPREHENSIVE) NOTE  Beth Gibson is a 26 y.o. [id number] at [redacted]w[redacted]d  who is admitted for severe anemia requiring blood transfusion prior to repeat c/s.Marland Kitchen   Length of Stay:  1  Days  Subjective:  Patient reports the fetal movement as active. Patient reports uterine contraction  activity as none. Patient reports  vaginal bleeding as none. Patient describes fluid per vagina as None.  Vitals:  Blood pressure 109/77, pulse 92, temperature 97.8 F (36.6 C), temperature source Oral, resp. rate 20, height 5\' 8"  (1.727 m), weight 96.616 kg (213 lb), last menstrual period 08/20/2011, SpO2 100.00%, not currently breastfeeding. Physical Examination:  General appearance - alert, well appearing, and in no distress Abdomen - soft, nontender, nondistended, no masses or organomegaly Extremities - Homan's sign negative bilaterally   Labs:  Recent Results (from the past 24 hour(s))  PREPARE RBC (CROSSMATCH)   Collection Time   04/17/12 10:30 PM      Component Value Range   Order Confirmation PENDING    TYPE AND SCREEN   Collection Time   04/17/12 10:50 PM      Component Value Range   ABO/RH(D) B POS     Antibody Screen POS     Sample Expiration 04/20/2012     Antibody Identification ANTI-Lea (LEWIS a)     PT AG Type NEGATIVE FOR LEWIS A ANTIGEN     DAT, IgG NEG     Unit Number [id number]     Blood Component Type RED CELLS,LR     Unit division 00     Status of Unit ISSUED     Transfusion Status OK TO TRANSFUSE     Crossmatch Result COMPATIBLE     Donor AG Type NEGATIVE FOR LEWIS A ANTIGEN     Unit Number [id number]     Blood Component Type RED CELLS,LR     Unit division 00     Status of Unit ISSUED     Transfusion Status OK TO TRANSFUSE     Crossmatch Result COMPATIBLE     Donor AG Type NEGATIVE FOR LEWIS A ANTIGEN    ABO/RH   Collection Time   04/17/12 10:50 PM      Component Value  Range   ABO/RH(D) B POS      Imaging Studies:    n/a  Medications:  Scheduled    . acetaminophen  650 mg Oral Once  . acetaminophen  650 mg Oral Once  .  ceFAZolin (ANCEF) IV  2 g Intravenous On Call to OR  . diphenhydrAMINE  25 mg Oral Once  . diphenhydrAMINE  25 mg Oral Once  . mupirocin ointment   Nasal BID   I have reviewed the patient's current medications.  ASSESSMENT: Patient Active Problem List  Diagnoses  . Previous preterm delivery, antepartum  . Obesity (BMI 30-39.9)  . Previous cesarean delivery, antepartum condition or complication  . Supervision of high-risk pregnancy    PLAN: Beth Gibson is a 26 y.o. [phone] at [redacted]w[redacted]d  who is admitted for severe anemia requiring blood transfusion prior to repeat c/s.Marland Kitchen Patient consented for cesarean section.  Risks include but not limited to bleeding, infection, damage to intraabdominal organs, hysterectomy, DVT/PE.  Yechezkel Fertig H. 04/18/2012,9:09 AM

## 2012-04-18 NOTE — Anesthesia Preprocedure Evaluation (Addendum)
Anesthesia Evaluation  Patient identified by MRN, date of birth, ID band Patient awake    Reviewed: Allergy & Precautions, H&P , NPO status , Patient's Chart, lab work & pertinent test results  Airway Mallampati: III TM Distance: >3 FB Neck ROM: full    Dental No notable dental hx. (+) Teeth Intact   Pulmonary neg pulmonary ROS,  breath sounds clear to auscultation  Pulmonary exam normal       Cardiovascular negative cardio ROS  Rhythm:regular Rate:Normal     Neuro/Psych  Headaches, negative psych ROS   GI/Hepatic negative GI ROS, Neg liver ROS,   Endo/Other  negative endocrine ROS  Renal/GU negative Renal ROS  negative genitourinary   Musculoskeletal   Abdominal Normal abdominal exam  (+)   Peds  Hematology  (+) anemia , Currently being transfused for anemia   Anesthesia Other Findings   Reproductive/Obstetrics (+) Pregnancy                           Anesthesia Physical Anesthesia Plan  ASA: II  Anesthesia Plan: Spinal   Post-op Pain Management:    Induction:   Airway Management Planned:   Additional Equipment:   Intra-op Plan:   Post-operative Plan:   Informed Consent: I have reviewed the patients History and Physical, chart, labs and discussed the procedure including the risks, benefits and alternatives for the proposed anesthesia with the patient or authorized representative who has indicated his/her understanding and acceptance.     Plan Discussed with: Anesthesiologist, CRNA and Surgeon  Anesthesia Plan Comments:        Anesthesia Quick Evaluation

## 2012-04-18 NOTE — OR Nursing (Signed)
Beth Celeste, DO participated in the time out @ 1005.

## 2012-04-18 NOTE — Anesthesia Postprocedure Evaluation (Signed)
  Anesthesia Post-op Note  Patient: Beth Gibson  Procedure(s) Performed: Procedure(s) (LRB): CESAREAN SECTION (N/A)  Patient is awake, responsive, moving her legs, and has signs of resolution of her numbness. Pain and nausea are reasonably well controlled. Vital signs are stable and clinically acceptable. Oxygen saturation is clinically acceptable. There are no apparent anesthetic complications at this time. Patient is ready for discharge.

## 2012-04-18 NOTE — Op Note (Signed)
Beth Gibson PROCEDURE DATE: 04/17/2012 - 04/18/2012  PREOPERATIVE DIAGNOSIS: Intrauterine pregnancy at  [redacted]w[redacted]d weeks gestation; previous uterine incision classical  POSTOPERATIVE DIAGNOSIS: The same  PROCEDURE: Repeat Low Transverse Cesarean Section, keloid removal  SURGEON:  Dr. Elsie Lincoln  ASSISTANT: Dr Candelaria Celeste  INDICATIONS: Beth Gibson is a 26 y.o. [id number] at [redacted]w[redacted]d scheduled for cesarean section secondary to previous uterine incision classical.  The risks of cesarean section discussed with the patient included but were not limited to: bleeding which may require transfusion or reoperation; infection which may require antibiotics; injury to bowel, bladder, ureters or other surrounding organs; injury to the fetus; need for additional procedures including hysterectomy in the event of a life-threatening hemorrhage; placental abnormalities wth subsequent pregnancies, incisional problems, thromboembolic phenomenon and other postoperative/anesthesia complications. The patient concurred with the proposed plan, giving informed written consent for the procedure.    FINDINGS:  Viable female infant in cephalic presentation.  Apgars 8 and 8, weight, 5 pounds and 6.6 ounces.  Clear amniotic fluid.  Intact placenta, three vessel cord.  Normal uterus, fallopian tubes and ovaries bilaterally.  ANESTHESIA:    Spinal INTRAVENOUS FLUIDS:1400 ml ESTIMATED BLOOD LOSS: 700 ml URINE OUTPUT:  150 ml SPECIMENS: Placenta sent to L&D COMPLICATIONS: None immediate  PROCEDURE IN DETAIL:  The patient received intravenous antibiotics and had sequential compression devices applied to her lower extremities while in the preoperative area.  She was then taken to the operating room where spinal anesthesia was administered and was found to be adequate. She was then placed in a dorsal supine position with a leftward tilt, and prepped and draped in a sterile manner.  A foley catheter was placed into her bladder and  attached to constant gravity, which drained clear fluid throughout.  After an adequate timeout was performed, an elliptical skin incision was made with scalpel around the keloid scar and the keloid was removed.  The incision was then carried through to the underlying layer of fascia. The fascia was incised in the midline and this incision was extended bilaterally using the Mayo scissors. Kocher clamps were applied to the superior aspect of the fascial incision and the underlying rectus muscles were dissected off sharply. A similar process was carried out on the inferior aspect of the facial incision. The rectus muscles were separated in the midline bluntly and the peritoneum was entered bluntly. An Alexis retractor was inserted.  Attention was turned to the lower uterine segment where a transverse hysterotomy was made with a scalpel and extended bilaterally bluntly. The bladder blade was then removed. The infant was successfully delivered, and cord was clamped and cut and infant was handed over to awaiting neonatology team. Uterine massage was then administered and the placenta delivered intact with three-vessel cord. The uterus was then cleared of clot and debris.  The hysterotomy was closed with 0 Vicryl in a running locked fashion. Overall, excellent hemostasis was noted. The abdomen and the pelvis were cleared of all clot and debris. Hemostasis was confirmed on all surfaces.  The fascia was then closed using 0 Vicryl in a running fashion.  The subcutaneous layer was reapproximated with plain gut and the skin was closed with 4-0 Vicryl. The patient tolerated the procedure well. Sponge, lap, instrument and needle counts were correct x 2. She was taken to the recovery room in stable condition.    Beth Gibson Beth Gibson 04/18/2012 11:01 AM

## 2012-04-18 NOTE — Progress Notes (Signed)
Dr Malen Gauze at pt's bedside.  Clarifying Narcan order.  Okay to give concentration of Narcan already mixed by pharmacy Narcan 1mg /250 NS to run at 125/hr.

## 2012-04-18 NOTE — Anesthesia Procedure Notes (Signed)
Spinal  Patient location during procedure: OR Start time: 04/18/2012 9:55 AM Staffing Anesthesiologist: Zylan Almquist A. Performed by: anesthesiologist  Preanesthetic Checklist Completed: patient identified, site marked, surgical consent, pre-op evaluation, timeout performed, IV checked, risks and benefits discussed and monitors and equipment checked Spinal Block Patient position: sitting Prep: site prepped and draped and DuraPrep Patient monitoring: heart rate, cardiac monitor, continuous pulse ox and blood pressure Approach: midline Location: L3-4 Injection technique: single-shot Needle Needle type: Sprotte  Needle gauge: 24 G Needle length: 9 cm Needle insertion depth: 5 cm Assessment Sensory level: T4 Additional Notes Patient tolerated procedure well. Adequate sensory level.

## 2012-04-19 ENCOUNTER — Encounter (HOSPITAL_COMMUNITY): Payer: Self-pay | Admitting: Obstetrics & Gynecology

## 2012-04-19 LAB — TYPE AND SCREEN
ABO/RH(D): B POS
Antibody Screen: POSITIVE
Donor AG Type: NEGATIVE
Donor AG Type: NEGATIVE
PT AG Type: NEGATIVE
Unit division: 0
Unit division: 0

## 2012-04-19 LAB — CBC
MCH: 20.5 pg — ABNORMAL LOW (ref 26.0–34.0)
MCHC: 30.9 g/dL (ref 30.0–36.0)
Platelets: 118 10*3/uL — ABNORMAL LOW (ref 150–400)
RBC: 3.86 MIL/uL — ABNORMAL LOW (ref 3.87–5.11)

## 2012-04-19 MED ORDER — OXYCODONE-ACETAMINOPHEN 5-325 MG PO TABS: 1.0000 | ORAL_TABLET | Freq: Four times a day (QID) | ORAL | Status: AC | PRN

## 2012-04-19 MED ORDER — FERROUS SULFATE 325 (65 FE) MG PO TABS: 325.0000 mg | ORAL_TABLET | Freq: Three times a day (TID) | ORAL | Status: DC

## 2012-04-19 MED ORDER — NORETHINDRONE 0.35 MG PO TABS: 1.0000 | ORAL_TABLET | Freq: Every day | ORAL | Status: DC

## 2012-04-19 MED ORDER — IBUPROFEN 600 MG PO TABS: 600.0000 mg | ORAL_TABLET | Freq: Four times a day (QID) | ORAL | Status: AC | PRN

## 2012-04-19 NOTE — Progress Notes (Signed)
Subjective: Postpartum Day 1: Cesarean Delivery Patient reports tolerating PO, + flatus and no problems voiding.   She has little incisional pain. She is worried about her baby in NICU.  Objective: Vital signs in last 24 hours: Temp:  [96.8 F (36 C)-98.8 F (37.1 C)] 98.8 F (37.1 C) (04/30 0423) Pulse Rate:  [57-97] 85  (04/30 0423) Resp:  [18-28] 20  (04/30 0423) BP: (103-129)/(50-78) 116/66 mmHg (04/30 0423) SpO2:  [97 %-100 %] 97 % (04/30 0423)  Physical Exam:  General: alert and cooperative Lochia: appropriate Uterine Fundus: firm Incision: CDI dressings DVT Evaluation: No evidence of DVT seen on physical exam.   Basename 04/19/12 0520  HGB 7.9*  HCT 25.6*    Assessment/Plan: Status post Cesarean section. Doing well postoperatively.  Continue current care. Bottle feeding Micronor for 6 weeks then Mirena IUD Offered early discharge, patient would like to stay till tomorrow morning. She is worried about her baby in NICU.  Scripts printed out and left with Diplomatic Services operational officer for tomorrow.  2. Acute on Chronic blood loss/Iron def. Anemia - patient has a stable H/H without symptoms - would not transfuse Lab Results  Component Value Date   HGB 7.9* 04/19/2012  - she will need to be discharged with Iron  Edd Arbour MD 04/19/2012, 7:22 AM

## 2012-04-19 NOTE — Progress Notes (Signed)
I have seen and examined this patient and I agree with the above. Cam Hai 10:38 AM 04/19/2012

## 2012-04-19 NOTE — Addendum Note (Signed)
Addendum  created 04/19/12 1703 by Elbert Ewings, CRNA   Modules edited:Charges VN

## 2012-04-19 NOTE — Progress Notes (Signed)
Clinical Social Work Department PSYCHOSOCIAL ASSESSMENT - MATERNAL/CHILD 04/19/2012  Patient:  Beth Gibson, Beth Gibson  Account Number:  [id number]  Admit Date:  04/17/2012  Marjo Bicker Name:   Flint Melter    Clinical Social Worker:  Lulu Riding, LCSW   Date/Time:  04/19/2012 10:32 AM  Date Referred:  04/19/2012   Referral source  NICU     Referred reason  NICU   Other referral source:    I:  FAMILY / HOME ENVIRONMENT Child's legal guardian:  PARENT  Guardian - Name Guardian - Age Guardian - Address  [place] [address] [place], [place] [address]  [facility]     Other household support members/support persons Other support:   MOB reports having a good support system of family and friends.    II  PSYCHOSOCIAL DATA Information Source:  Patient Interview  Event organiser Employment:   unknown   Surveyor, quantity resources:   If Medicaid - County:  [employer] / Grade:   Maternity Care Coordinator / Child Services Coordination / Early Interventions:  Cultural issues impacting care:   none known    III  STRENGTHS Strengths  Adequate Resources  Compliance with medical plan  Home prepared for Child (including basic supplies)  Other - See comment  Supportive family/friends   Strength comment:  Pediatric follow up will be at [facility]   IV  RISK FACTORS AND CURRENT PROBLEMS Current Problem:  YES   Risk Factor & Current Problem Patient Issue Family Issue Risk Factor / Current Problem Comment  Substance Abuse Y N Chart notes Marijuana use    V  SOCIAL WORK ASSESSMENT SW met with MOB in her first floor room/133 to introduce myself, complete assessment and evaluate how she is coping with baby's admission to NICU.  MOB states that she is feeling pretty well, but that she does not completely understand why the baby had to go to NICU.  She states she knows she needed some Oxygen support.  She states that FOB has been up to see the  baby today, but that she plans to go after taking a shower.  SW informed her of daily Rounds and the opportunity to have a family conference at any time. SW encouraged her to ask questions to get a better understanding of the situation and she states that she will.  She reports that her son was born at 17 weeks and stayed in the NICU 1.5 months.  She hopes that baby will not have to stay in the hospital past her discharge.  SW discussed the reality that MOB may be discharged before baby is ready and acknowledged how this would be disappointing.  MOB was understanding and states no issues with transportation to visit baby if MOB is discharged first.  She reports having everything she needs for baby at home and has a good support system.  She and FOB are together and he is involved and supportive.  He is the father of her other child.  FOB and another family member walked in to the room at this point and therefore SW did not get a chance to ask MOB about the Marijuana use as noted in her chart.  SW later called MOB on the phone and asked if she was alone.  She said yes so SW asked her about the hx of Marijuana use.  She states that it was a one time occurance before she knew she was pregnant and that she does not know why  she did it, but has no plans to do it again.  SW informed her of hospital drug screen policy and she was not at all concerned.  SW asked if she has a better understanding of baby's condition at this time after a visit to the NICU and she said yes.  She has no further questions or needs at this time.  SW explained support services offered by NICU SW and asked MOB to contact SW if she has any questions or needs during baby's stay.      VI SOCIAL WORK PLAN Social Work Plan  Psychosocial Support/Ongoing Assessment of Needs   Type of pt/family education:   If child protective services report - county:   If child protective services report - date:   Information/referral to community resources  comment:   Other social work plan:   SW to monitor drug screens.

## 2012-04-20 NOTE — Progress Notes (Signed)
Subjective: Postpartum Day 2: Cesarean Delivery Patient reports incisional pain, tolerating PO and no problems voiding.    Objective: Vital signs in last 24 hours: Temp:  [97.7 F (36.5 C)-98.2 F (36.8 C)] 98.2 F (36.8 C) (05/01 2246) Pulse Rate:  [80-93] 81  (05/01 2246) Resp:  [18-19] 19  (05/01 2246) BP: (108-109)/(66-74) 108/74 mmHg (05/01 2246) SpO2:  [98 %] 98 % (05/01 1945)  Physical Exam:  General: alert, cooperative and no distress Lochia: appropriate Uterine Fundus: firm Incision: no significant drainage DVT Evaluation: No evidence of DVT seen on physical exam.   Basename 04/19/12 0520  HGB 7.9*  HCT 25.6*    Assessment/Plan: Status post Cesarean section. Doing well postoperatively.  Continue current care.  LEFTWICH-KIRBY, Ymani Porcher 04/20/2012, 11:21 PM

## 2012-04-21 DIAGNOSIS — Z98891 History of uterine scar from previous surgery: Secondary | ICD-10-CM | POA: Diagnosis not present

## 2012-04-21 MED ORDER — MEASLES, MUMPS & RUBELLA VAC ~~LOC~~ INJ
0.5000 mL | INJECTION | Freq: Once | SUBCUTANEOUS | Status: AC
  Administered 2012-04-21: 0.5 mL via SUBCUTANEOUS
  Filled 2012-04-21: qty 0.5

## 2012-04-21 MED ORDER — IBUPROFEN 600 MG PO TABS: 600.0000 mg | ORAL_TABLET | Freq: Four times a day (QID) | ORAL | Status: AC

## 2012-04-21 NOTE — Discharge Summary (Signed)
Obstetric Discharge Summary Reason for Admission: cesarean section Prenatal Procedures: ultrasound Intrapartum Procedures: cesarean: low cervical, transverse Postpartum Procedures: Rubella Ig Complications-Operative and Postpartum: none Hemoglobin  Date Value Range Status  04/19/2012 7.9* 12.0-15.0 (g/dL) Final     HCT  Date Value Range Status  04/19/2012 25.6* 36.0-46.0 (%) Final    Physical Exam:  General: alert, cooperative and no distress Lochia: appropriate Uterine Fundus: firm Incision: healing well, no significant drainage, no dehiscence, no significant erythema, steristrips in place DVT Evaluation: No cords or calf tenderness. No significant calf/ankle edema.  Discharge Diagnoses: Preterm, delivered Anemia, stable  Discharge Information: Date: 04/21/2012 Activity: pelvic rest Diet: routine and increase dietary iron Medications: Ibuprofen, Colace, Iron, Percocet and Micronor Condition: stable Instructions: refer to practice specific booklet and Mirena  Discharge to: home Follow-up Information    Follow up with [facility] in 5 weeks. (or [facility] as needed)    Contact information:   [address] [place] [address]          Newborn Data: Live born female  Birth Weight: 5 lb 6.6 oz (2455 g) APGAR: 8, 8  Home with mother. Bottle feeding Mirena  Beth Gibson 04/21/2012, 11:29 AM

## 2012-04-21 NOTE — Discharge Summary (Signed)
Agree with above note.  Beth Gibson 04/21/2012 1:34 PM

## 2012-04-21 NOTE — H&P (Signed)
If pt has MRSA, then Vanc to be ordered prior to surgery.

## 2012-04-28 NOTE — Op Note (Signed)
Present for operation 

## 2012-05-19 ENCOUNTER — Ambulatory Visit: Payer: Self-pay | Admitting: Obstetrics & Gynecology

## 2012-07-07 ENCOUNTER — Emergency Department (HOSPITAL_COMMUNITY)
Admission: EM | Admit: 2012-07-07 | Discharge: 2012-07-07 | Disposition: A | Discharge status: Home / Self Care | Payer: Medicaid Other | Source: Home / Self Care

## 2012-07-07 ENCOUNTER — Encounter (HOSPITAL_COMMUNITY): Payer: Self-pay | Admitting: Emergency Medicine

## 2012-07-07 DIAGNOSIS — L0231 Cutaneous abscess of buttock: Secondary | ICD-10-CM

## 2012-07-07 MED ORDER — HYDROCODONE-ACETAMINOPHEN 5-325 MG PO TABS: 1.0000 | ORAL_TABLET | Freq: Four times a day (QID) | ORAL | Status: AC | PRN

## 2012-07-07 MED ORDER — SULFAMETHOXAZOLE-TRIMETHOPRIM 800-160 MG PO TABS: 1.0000 | ORAL_TABLET | Freq: Two times a day (BID) | ORAL | Status: AC

## 2012-07-07 NOTE — ED Provider Notes (Signed)
Medical screening examination/treatment/procedure(s) were performed by PGY-3 FM resident and as supervising physician I personally examined patient, discussed case and management with resident physician and was immediately available for consultation/collaboration. Agree with documentation above.  Sharin Grave, MD  Sharin Grave, MD 07/07/12 [phone]

## 2012-07-07 NOTE — ED Provider Notes (Addendum)
History     CSN: [id number]  Arrival date & time 07/07/12  1143   First MD Initiated Contact with Patient 07/07/12 1248      Chief Complaint  Patient presents with  . Recurrent Skin Infections    HPI  Patient presents to Urgent Care center for a boil on RT buttocks.  Patient says she felt a "bump" on her bottom and thought it was a mosquito bite.  Today, patient says the lesion has become larger and more painful.  Patient says "it hurts to sit down."  Pain is localized to RT side of gluteal fold with radiation to RT lower thigh.  Patient has had previous boils in the past and I&D of an axillary abscess several years ago.    Patient denies any associated fever, chills, NS, nausea/vomiting.  She does not have a history of DM.   Past Medical History  Diagnosis Date  . Chlamydia   . Trichomonas   . Preterm labor   . Headache   . UTI (lower urinary tract infection)     Past Surgical History  Procedure Date  . Cyst removed from l buttock   . Cesarean section   . Wisdom tooth extraction   . Wisdom tooth extraction 2009  . Cystectomy 2009    left buttock  . Cesarean section 04/18/2012    Procedure: CESAREAN SECTION;  Surgeon: Lesly Dukes, MD;  Location: [facility];  Service: Gynecology;  Laterality: N/A;  repeat    Family History  Problem Relation Age of Onset  . Hypertension Mother     History  Substance Use Topics  . Smoking status: Never Smoker   . Smokeless tobacco: Never Used  . Alcohol Use: 0.5 oz/week    1 drink(s) per week     "Awhile back"    OB History    Grav Para Term Preterm Abortions TAB SAB Ect Mult Living   3 2 0 2 1 0 1 0 0 2       Review of Systems  Per HPI  Allergies  Review of patient's allergies indicates no known allergies.  Home Medications   Current Outpatient Rx  Name Route Sig Dispense Refill  . FERROUS SULFATE 325 (65 FE) MG PO TABS Oral Take 1 tablet (325 mg total) by mouth 3 (three) times daily with meals. 30 tablet 11  .  NORETHINDRONE 0.35 MG PO TABS Oral Take 1 tablet (0.35 mg total) by mouth daily. 1 Package 1  . PRENATAL MULTIVITAMIN CH Oral Take 1 tablet by mouth daily.      BP 125/80  Pulse 86  Temp 98.6 F (37 C) (Oral)  Resp 16  SpO2 100%  LMP 06/30/2012  Physical Exam  Constitutional: No distress.  Cardiovascular: Normal rate and normal heart sounds.   Pulmonary/Chest: Effort normal and breath sounds normal.  Skin:       RT buttock: hard, indurated, oval-shaped lesion located to RT of gluteal fold; very warm and tender on palpation; no redness, erythema, active bleeding or drainage at this time    ED Course  INCISION AND DRAINAGE Date/Time: 07/07/2012 5:17 PM Performed by: DE LA CRUZ, IVY Authorized by: Sharin Grave Consent: Verbal consent obtained. Risks and benefits: risks, benefits and alternatives were discussed Consent given by: patient Patient understanding: patient states understanding of the procedure being performed Patient identity confirmed: verbally with patient Type: abscess Body area: anogenital Location details: gluteal cleft Anesthesia: local infiltration Local anesthetic: lidocaine 1% without epinephrine Incision type: elliptical  Drainage: purulent Drainage amount: moderate Wound treatment: wound left open Packing material: 1/4 in iodoform gauze Patient tolerance: Patient tolerated the procedure well with no immediate complications.   (including critical care time)   Labs Reviewed  CULTURE, ROUTINE-ABSCESS   No results found.   1. Abscess of buttock, right      MDM   Abscess, right buttock:  This was incised and drained per procedure note above.  Patient given Rx for Vicodin #10 tablets and Septra DS x 7 days.  Patient to return in 24-48 hours for wound follow up and to change packing.  Red flags reviewed per Discharge Instructions.  Home care instructions given per handout.  Patient tolerated procedure well and discharged home in good condition.           Barnabas Lister, MD 07/07/12 1722  Barnabas Lister, MD 07/14/12 [phone]

## 2012-07-07 NOTE — ED Notes (Signed)
I/D SITE TO RIGHT BUTTOCK INTACT WITH PACKING.BACITRACIN APPLIED AND COVERED WITH 4X4 AND MEDIPORE

## 2012-07-07 NOTE — ED Notes (Signed)
P[t here with boil to right buttock that appeared x2 dys ago starting with small pimple then increased in size and tenderness.no drainage noted.pain with sitting

## 2012-07-10 LAB — CULTURE, ROUTINE-ABSCESS

## 2012-07-11 NOTE — ED Notes (Signed)
Abscess culture R buttocks: Mod. Staph. Aureus.  Pt. adequately treated with Bactrim DS. Beth Gibson 07/11/2012

## 2012-07-15 NOTE — ED Provider Notes (Signed)
Medical screening examination/treatment/procedure(s) were performed by PGY-3 FM resident and as supervising physician I was immediately available for consultation/collaboration.   Sharin Grave, MD   Sharin Grave, MD 07/15/12 [phone]

## 2012-07-19 ENCOUNTER — Encounter (HOSPITAL_COMMUNITY): Payer: Self-pay | Admitting: Emergency Medicine

## 2012-07-19 ENCOUNTER — Emergency Department (HOSPITAL_COMMUNITY)
Admission: EM | Admit: 2012-07-19 | Discharge: 2012-07-19 | Disposition: A | Discharge status: Home / Self Care | Payer: Medicaid Other | Source: Home / Self Care | Attending: Emergency Medicine | Admitting: Emergency Medicine

## 2012-07-19 DIAGNOSIS — R319 Hematuria, unspecified: Secondary | ICD-10-CM

## 2012-07-19 DIAGNOSIS — N760 Acute vaginitis: Secondary | ICD-10-CM

## 2012-07-19 DIAGNOSIS — A499 Bacterial infection, unspecified: Secondary | ICD-10-CM

## 2012-07-19 HISTORY — DX: Candidiasis of vulva and vagina: B37.3

## 2012-07-19 HISTORY — DX: Other specified bacterial agents as the cause of diseases classified elsewhere: N76.0

## 2012-07-19 HISTORY — DX: Acute candidiasis of vulva and vagina: B37.31

## 2012-07-19 HISTORY — DX: Other specified bacterial agents as the cause of diseases classified elsewhere: B96.89

## 2012-07-19 LAB — POCT URINALYSIS DIP (DEVICE)
Ketones, ur: NEGATIVE mg/dL
Leukocytes, UA: NEGATIVE
Nitrite: NEGATIVE
Protein, ur: NEGATIVE mg/dL
Urobilinogen, UA: 0.2 mg/dL (ref 0.0–1.0)
pH: 7 (ref 5.0–8.0)

## 2012-07-19 LAB — WET PREP, GENITAL

## 2012-07-19 MED ORDER — METRONIDAZOLE 500 MG PO TABS: 500.0000 mg | ORAL_TABLET | Freq: Two times a day (BID) | ORAL | Status: AC

## 2012-07-19 MED ORDER — IBUPROFEN 600 MG PO TABS: 600.0000 mg | ORAL_TABLET | Freq: Four times a day (QID) | ORAL | Status: AC | PRN

## 2012-07-19 MED ORDER — FLUCONAZOLE 150 MG PO TABS: ORAL_TABLET | ORAL | Status: AC

## 2012-07-19 NOTE — ED Notes (Signed)
C/o urinary frequency, voiding small amounts, lower abdominal and low back pain, and vaginal discharge for 2 days.

## 2012-07-19 NOTE — ED Provider Notes (Signed)
History     CSN: [id number]  Arrival date & time 07/19/12  1120   First MD Initiated Contact with Patient 07/19/12 1122      Chief Complaint  Patient presents with  . Vaginal Discharge  . Urinary Frequency    (Consider location/radiation/quality/duration/timing/severity/associated sxs/prior treatment) HPI Comments: Pt with 1 month oderous vaginal discharge. Reports some urinary urgency, frequency starting today. Reports intermittent sharp suprapubic lower abdominal pain that lasts seconds with occasional radiation up the left flank to her back. No  dysuria, oderous urine, hematuria,  genital blisters, vaginal itching, vaginal bleeding. No fevers, N/V, other abd pain, other back pain. No recent abx use- she was prescribed Bactrim for a abscess on 7/18, but did not fill the prescription.. Pt sexually active with same female partner who is asxatic. STD's not a concern today. Similar sx before when had BV and a UTI. Has h/o chlamydia trichmonoas yeast infection, UTI. No h/o  Gonorrhea, syphilis, herpes, HIV. She has no personal history of kidney stones, but states that her mother and sister have  had a history of kidney stones.   ROS as noted in HPI. All other ROS negative.   Patient is a 26 y.o. female presenting with vaginal discharge. The history is provided by the patient. No language interpreter was used.  Vaginal Discharge This is a recurrent problem. The current episode started more than 1 week ago. The problem occurs constantly. The problem has not changed since onset.Associated symptoms include abdominal pain. Nothing aggravates the symptoms. Nothing relieves the symptoms. She has tried nothing for the symptoms. The treatment provided no relief.    Past Medical History  Diagnosis Date  . Chlamydia   . Trichomonas   . Preterm labor   . Headache   . UTI (lower urinary tract infection)   . BV (bacterial vaginosis)   . Yeast vaginitis     Past Surgical History  Procedure Date    . Cyst removed from l buttock   . Cesarean section   . Wisdom tooth extraction   . Wisdom tooth extraction 2009  . Cystectomy 2009    left buttock  . Cesarean section 04/18/2012    Procedure: CESAREAN SECTION;  Surgeon: Lesly Dukes, MD;  Location: [facility];  Service: Gynecology;  Laterality: N/A;  repeat    Family History  Problem Relation Age of Onset  . Hypertension Mother     History  Substance Use Topics  . Smoking status: Never Smoker   . Smokeless tobacco: Never Used  . Alcohol Use: 0.5 oz/week    1 drink(s) per week     "Awhile back"    OB History    Grav Para Term Preterm Abortions TAB SAB Ect Mult Living   3 2 0 2 1 0 1 0 0 2       Review of Systems  Gastrointestinal: Positive for abdominal pain.  Genitourinary: Positive for vaginal discharge.    Allergies  Review of patient's allergies indicates no known allergies.  Home Medications   Current Outpatient Rx  Name Route Sig Dispense Refill  . NORETHINDRONE 0.35 MG PO TABS Oral Take 1 tablet (0.35 mg total) by mouth daily. 1 Package 1  . FLUCONAZOLE 150 MG PO TABS  1 tab po x 1. May repeat in 72 hours if no improvement 2 tablet 0  . IBUPROFEN 600 MG PO TABS Oral Take 1 tablet (600 mg total) by mouth every 6 (six) hours as needed for pain. 30 tablet 0  .  METRONIDAZOLE 500 MG PO TABS Oral Take 1 tablet (500 mg total) by mouth 2 (two) times daily. X 7 days 14 tablet 0    BP 117/77  Pulse 77  Temp 98.2 F (36.8 C) (Oral)  Resp 16  SpO2 98%  LMP 06/30/2012  Physical Exam  Nursing note and vitals reviewed. Constitutional: She is oriented to person, place, and time. She appears well-developed and well-nourished. No distress.  HENT:  Head: Normocephalic and atraumatic.  Eyes: EOM are normal.  Neck: Normal range of motion.  Cardiovascular: Normal rate.   Pulmonary/Chest: Effort normal.  Abdominal: Soft. Bowel sounds are normal. She exhibits no distension. There is no tenderness. There is no rebound, no  guarding and no CVA tenderness.  Genitourinary: Uterus normal. Pelvic exam was performed with patient supine. There is no rash on the right labia. There is no rash on the left labia. Uterus is not tender. Cervix exhibits no motion tenderness and no friability. Right adnexum displays no mass, no tenderness and no fullness. Left adnexum displays no mass, no tenderness and no fullness. No erythema, tenderness or bleeding around the vagina. No foreign body around the vagina. Vaginal discharge found.       Thin White oderous  vaginal d/c. Chaperone present during exam  Musculoskeletal: Normal range of motion.  Neurological: She is alert and oriented to person, place, and time.  Skin: Skin is warm and dry.  Psychiatric: She has a normal mood and affect. Her behavior is normal. Judgment and thought content normal.    ED Course  Procedures (including critical care time)  Labs Reviewed  POCT URINALYSIS DIP (DEVICE) - Abnormal; Notable for the following:    Hgb urine dipstick LARGE (*)     All other components within normal limits  POCT PREGNANCY, URINE  WET PREP, GENITAL  GC/CHLAMYDIA PROBE AMP, GENITAL  URINE CULTURE   No results found.   1. BV (bacterial vaginosis)   2. Hematuria      Results for orders placed during the hospital encounter of 07/19/12  POCT URINALYSIS DIP (DEVICE)      Component Value Range   Glucose, UA NEGATIVE  NEGATIVE mg/dL   Bilirubin Urine NEGATIVE  NEGATIVE   Ketones, ur NEGATIVE  NEGATIVE mg/dL   Specific Gravity, Urine 1.015  1.005 - 1.030   Hgb urine dipstick LARGE (*) NEGATIVE   pH 7.0  5.0 - 8.0   Protein, ur NEGATIVE  NEGATIVE mg/dL   Urobilinogen, UA 0.2  0.0 - 1.0 mg/dL   Nitrite NEGATIVE  NEGATIVE   Leukocytes, UA NEGATIVE  NEGATIVE  POCT PREGNANCY, URINE      Component Value Range   Preg Test, Ur NEGATIVE  NEGATIVE    MDM  Previous records reviewed. Patient status post I&D right buttocks on 7/18 was sent home on 7 days of Bactrim. Was to  return in 2 days for wound followup and packing change. cx grew out strep sensitivie to bactrim.  H&P most c/w  BV vs trich. Hematuria noted on udip. Patient does not appear toxic, has no nausea/vomiting, fevers CVA or flank tenderness or a personal history of kidney stones. States she has an appointment with her PMD tomorrow, will have him recheck the urine at that point in time. Sent off GC/chlamydia, wet prep, urine culture because of her urinary symptoms Will not treat empirically now.  Will send home with flagyl. . Advised pt to refrain from sexual contact until she knows lab results, symptoms resolve, and partner(s) are  treated. Pt provided working phone number. Pt agrees.      Luiz Blare, MD 07/19/12 1304

## 2012-07-20 LAB — URINE CULTURE: Colony Count: 30000

## 2012-07-20 LAB — GC/CHLAMYDIA PROBE AMP, GENITAL: Chlamydia, DNA Probe: NEGATIVE

## 2012-07-20 NOTE — ED Notes (Signed)
GC/Chlamydia neg., Wet prep: mod. Clue cells, mod. WBC's, Urine culture pending.  Pt. adequately treated with Flagyl. Beth Gibson 07/20/2012

## 2012-08-07 ENCOUNTER — Emergency Department (HOSPITAL_COMMUNITY)
Admission: EM | Admit: 2012-08-07 | Discharge: 2012-08-07 | Disposition: A | Discharge status: Home / Self Care | Payer: Medicaid Other | Source: Home / Self Care | Attending: Emergency Medicine | Admitting: Emergency Medicine

## 2012-08-07 ENCOUNTER — Encounter (HOSPITAL_COMMUNITY): Payer: Self-pay

## 2012-08-07 DIAGNOSIS — L02411 Cutaneous abscess of right axilla: Secondary | ICD-10-CM

## 2012-08-07 DIAGNOSIS — IMO0002 Reserved for concepts with insufficient information to code with codable children: Secondary | ICD-10-CM

## 2012-08-07 DIAGNOSIS — L02412 Cutaneous abscess of left axilla: Secondary | ICD-10-CM

## 2012-08-07 MED ORDER — HYDROCODONE-ACETAMINOPHEN 5-500 MG PO TABS: 1.0000 | ORAL_TABLET | Freq: Four times a day (QID) | ORAL | Status: AC | PRN

## 2012-08-07 MED ORDER — DOXYCYCLINE HYCLATE 100 MG PO CAPS: 100.0000 mg | ORAL_CAPSULE | Freq: Two times a day (BID) | ORAL | Status: DC

## 2012-08-07 NOTE — ED Notes (Signed)
Pt has abscesses in rt and lt axilla for three days.  She has hx of abscess in rt axilla and on her hip.

## 2012-08-07 NOTE — ED Provider Notes (Signed)
History     CSN: [id number]  Arrival date & time 08/07/12  1116   First MD Initiated Contact with Patient 08/07/12 1118      Chief Complaint  Patient presents with  . Abscess    (Consider location/radiation/quality/duration/timing/severity/associated sxs/prior treatment) HPI Comments: Patient presents urgent care this morning complaining of tenderness, swelling pain on both armpits. Mainly on the left side it, but also on her right axilla. Describes that she has had morbid injections in the past but had to be drained and she is taking antibiotics for it. Describes it as being 3 days since it started this morning she has noticed they both have gotten more swollen. Tender with movement and touch. Patient denies any systemic symptoms such as fevers, body aches, arthralgias, myalgias, or changes in appetite. She did try at home by applying warm compresses to it. At that did not help.  Patient is a 26 y.o. female presenting with abscess. The history is provided by the patient.  Abscess  This is a new problem. The onset was gradual. The problem occurs frequently. The abscess is present on the left arm and right arm. The problem is moderate. The abscess is characterized by swelling and draining. The patient was exposed to OTC medications. The abscess first occurred at home. Pertinent negatives include no anorexia, no fussiness and no sore throat. There were no sick contacts.    Past Medical History  Diagnosis Date  . Chlamydia   . Trichomonas   . Preterm labor   . Headache   . UTI (lower urinary tract infection)   . BV (bacterial vaginosis)   . Yeast vaginitis     Past Surgical History  Procedure Date  . Cyst removed from l buttock   . Cesarean section   . Wisdom tooth extraction   . Wisdom tooth extraction 2009  . Cystectomy 2009    left buttock  . Cesarean section 04/18/2012    Procedure: CESAREAN SECTION;  Surgeon: Lesly Dukes, MD;  Location: [facility];  Service: Gynecology;   Laterality: N/A;  repeat    Family History  Problem Relation Age of Onset  . Hypertension Mother     History  Substance Use Topics  . Smoking status: Never Smoker   . Smokeless tobacco: Never Used  . Alcohol Use: 0.5 oz/week    1 drink(s) per week     "Awhile back"    OB History    Grav Para Term Preterm Abortions TAB SAB Ect Mult Living   3 2 0 2 1 0 1 0 0 2       Review of Systems  Constitutional: Negative for chills, activity change and appetite change.  HENT: Negative for sore throat.   Gastrointestinal: Negative for anorexia.  Musculoskeletal: Negative for back pain, joint swelling and arthralgias.  Skin: Negative for color change, rash and wound.    Allergies  Review of patient's allergies indicates no known allergies.  Home Medications   Current Outpatient Rx  Name Route Sig Dispense Refill  . DOXYCYCLINE HYCLATE 100 MG PO CAPS Oral Take 1 capsule (100 mg total) by mouth 2 (two) times daily. 20 capsule 0  . HYDROCODONE-ACETAMINOPHEN 5-500 MG PO TABS Oral Take 1-2 tablets by mouth every 6 (six) hours as needed for pain. 15 tablet 0  . NORETHINDRONE 0.35 MG PO TABS Oral Take 1 tablet (0.35 mg total) by mouth daily. 1 Package 1    BP 114/63  Pulse 106  Temp 99.5 F (37.5 C) (  Oral)  Resp 18  SpO2 100%  Physical Exam  Nursing note and vitals reviewed. Constitutional: Vital signs are normal. She appears well-developed and well-nourished.  Non-toxic appearance. She does not have a sickly appearance. She does not appear ill. No distress.  Musculoskeletal: She exhibits tenderness.       Arms: Neurological: She is alert.  Skin: No rash noted. No erythema.    ED Course  INCISION AND DRAINAGE Performed by: Korry Dalgleish Authorized by: Jimmie Molly Consent: Verbal consent obtained. Written consent not obtained. Consent given by: patient Patient understanding: patient states understanding of the procedure being performed Patient identity confirmed: verbally with  patient Type: abscess Body area: upper extremity (Right and left axilla) Anesthesia: local infiltration Local anesthetic: lidocaine 2% with epinephrine Anesthetic total: 5 ml Scalpel size: 11 Needle gauge: 22 Complexity: complex Drainage: purulent Drainage amount: moderate Packing material: 1/4 in gauze Patient tolerance: Patient tolerated the procedure well with no immediate complications. Comments: Sample obtained for cultures  INCISION AND DRAINAGE Performed by: Jadarius Commons Authorized by: Jimmie Molly Consent: Verbal consent obtained. Patient understanding: patient states understanding of the procedure being performed Patient identity confirmed: verbally with patient Type: abscess Body area: upper extremity (Right axillary abscess) Anesthesia: local infiltration Local anesthetic: lidocaine 2% with epinephrine Anesthetic total: 5 ml Scalpel size: 11 Needle gauge: 18 Incision type: single straight Complexity: simple Drainage amount: scant Wound treatment: wound left open Packing material: 1/4 in gauze Patient tolerance: Patient tolerated the procedure well with no immediate complications.   (including critical care time)   Labs Reviewed  CULTURE, ROUTINE-ABSCESS   No results found.   1. Abscess of axilla, left   2. Abscess of axilla, right    uncomplicated procedure. Patient to return in 3 days for recheck and packing removal bilateral    MDM   Patient bilateral axillary abscesses. Both abscesses were I and D. to successful at obtaining abundant purulent discharge on the left axillary region. Both abscesses were packed, patient was started on antibiotics and provided with prescription for Lortab. Patient was instructed to return in 3 days for packing removal and wound recheck.       Jimmie Molly, MD 08/07/12 1224

## 2012-08-09 ENCOUNTER — Telehealth (HOSPITAL_COMMUNITY): Payer: Self-pay | Admitting: *Deleted

## 2012-08-09 LAB — CULTURE, ROUTINE-ABSCESS

## 2012-08-09 NOTE — ED Notes (Signed)
Abscess culture L axilla: Mod. Proteus Mirabilis.  Pt. treated with Doxycycline and I and D.  Not on sensitivity report. Lab shown to Dr. Ladon Applebaum and he said to call for clincal improvement. I called and left a message to call. Vassie Moselle 08/09/2012

## 2012-08-10 ENCOUNTER — Emergency Department (INDEPENDENT_AMBULATORY_CARE_PROVIDER_SITE_OTHER)
Admission: EM | Admit: 2012-08-10 | Discharge: 2012-08-10 | Disposition: A | Discharge status: Other Acute Inpatient Hospital | Payer: Medicaid Other | Source: Home / Self Care | Attending: Emergency Medicine | Admitting: Emergency Medicine

## 2012-08-10 ENCOUNTER — Emergency Department (HOSPITAL_COMMUNITY)
Admission: EM | Admit: 2012-08-10 | Discharge: 2012-08-10 | Disposition: A | Discharge status: Home / Self Care | Payer: Medicaid Other | Attending: Emergency Medicine | Admitting: Emergency Medicine

## 2012-08-10 ENCOUNTER — Encounter (HOSPITAL_COMMUNITY): Payer: Self-pay

## 2012-08-10 ENCOUNTER — Telehealth (HOSPITAL_COMMUNITY): Payer: Self-pay | Admitting: *Deleted

## 2012-08-10 ENCOUNTER — Encounter (HOSPITAL_COMMUNITY): Payer: Self-pay | Admitting: Emergency Medicine

## 2012-08-10 DIAGNOSIS — L02419 Cutaneous abscess of limb, unspecified: Secondary | ICD-10-CM

## 2012-08-10 DIAGNOSIS — IMO0002 Reserved for concepts with insufficient information to code with codable children: Secondary | ICD-10-CM

## 2012-08-10 DIAGNOSIS — L0291 Cutaneous abscess, unspecified: Secondary | ICD-10-CM

## 2012-08-10 DIAGNOSIS — Z8249 Family history of ischemic heart disease and other diseases of the circulatory system: Secondary | ICD-10-CM | POA: Insufficient documentation

## 2012-08-10 MED ORDER — OXYCODONE-ACETAMINOPHEN 5-325 MG PO TABS: 2.0000 | ORAL_TABLET | ORAL | Status: AC | PRN

## 2012-08-10 MED ORDER — OXYCODONE-ACETAMINOPHEN 5-325 MG PO TABS
2.0000 | ORAL_TABLET | Freq: Once | ORAL | Status: AC
  Administered 2012-08-10: 2 via ORAL
  Filled 2012-08-10: qty 2

## 2012-08-10 NOTE — ED Provider Notes (Signed)
History   This chart was scribed for Doug Sou, MD by Charolett Bumpers . The patient was seen in room TR09C/TR09C. Patient's care was started at 1301.    CSN: [id number]  Arrival date & time 08/10/12  1224   First MD Initiated Contact with Patient 08/10/12 1301      Chief Complaint  Patient presents with  . Abscess    (Consider location/radiation/quality/duration/timing/severity/associated sxs/prior treatment) HPI Beth Gibson is a 26 y.o. female who presents to the Emergency Department complaining of constant, moderate, worsening abscess to her right axillary region for the past week. Pt reports associated pain that is a 10/10. Pt was seen at [facility] on 8/18 for abscess in bilateral axilla where she had them drained and packed. Pt reports that there was no drainage from the right. Pt returned today to [facility] for a recheck and was sent to ED because the abscess on right has grown in size and has increased pain. Pt reports that she is unable to move or lift her right arm due to pain. Pt states that she was prescribed Vicodin with no relief. Pt denies any pertinent medical or surgical hx. Pt denies tobacco and alcohol use.    Past Medical History  Diagnosis Date  . Chlamydia   . Trichomonas   . Preterm labor   . Headache   . UTI (lower urinary tract infection)   . BV (bacterial vaginosis)   . Yeast vaginitis     Past Surgical History  Procedure Date  . Cyst removed from l buttock   . Cesarean section   . Wisdom tooth extraction   . Wisdom tooth extraction 2009  . Cystectomy 2009    left buttock  . Cesarean section 04/18/2012    Procedure: CESAREAN SECTION;  Surgeon: Lesly Dukes, MD;  Location: [facility];  Service: Gynecology;  Laterality: N/A;  repeat    Family History  Problem Relation Age of Onset  . Hypertension Mother     History  Substance Use Topics  . Smoking status: Never Smoker   . Smokeless tobacco: Never Used  . Alcohol Use: 0.5 oz/week    1  drink(s) per week     "Awhile back"    OB History    Grav Para Term Preterm Abortions TAB SAB Ect Mult Living   3 2 0 2 1 0 1 0 0 2       Review of Systems  Constitutional: Negative.   Musculoskeletal: Negative.   Skin: Positive for wound.       Abscess  Neurological: Negative.   Hematological: Negative.   Psychiatric/Behavioral: Negative.   All other systems reviewed and are negative.    Allergies  Review of patient's allergies indicates no known allergies.  Home Medications   Current Outpatient Rx  Name Route Sig Dispense Refill  . HYDROCODONE-ACETAMINOPHEN 5-500 MG PO TABS Oral Take 1-2 tablets by mouth every 6 (six) hours as needed for pain. 15 tablet 0  . NORETHINDRONE 0.35 MG PO TABS Oral Take 1 tablet by mouth daily.      BP 128/76  Pulse 100  Temp 100 F (37.8 C) (Oral)  Resp 20  SpO2 100%  Physical Exam  Nursing note and vitals reviewed. Constitutional: She appears well-developed and well-nourished.  HENT:  Head: Normocephalic and atraumatic.  Eyes: Conjunctivae are normal. Pupils are equal, round, and reactive to light.  Neck: Neck supple. No tracheal deviation present. No thyromegaly present.  Cardiovascular: Normal rate and regular rhythm.  No murmur heard. Pulmonary/Chest: Effort normal and breath sounds normal.  Abdominal: Soft. Bowel sounds are normal. She exhibits no distension. There is no tenderness.  Musculoskeletal: Normal range of motion. She exhibits no edema and no tenderness.  Neurological: She is alert. Coordination normal.  Skin: Skin is warm and dry. No rash noted.       Removed packing from right axillary abscess with copious amounts of purulent drainage.   Psychiatric: She has a normal mood and affect.    ED Course  Procedures (including critical care time)  INCISION AND DRAINAGE  Performed by: Doug Sou, MD Authorized by: Doug Sou, MD  Consent - Verbal Consent obtained Risks and benefits: risks/benefits and  alternatives were discussed Time out performed Type: Abscess  Body Area: right axillary   Anesthesia: Local infiltration Local anesthetic: lidocaine 2% with epinephrine  Anesthetic total: 6ml Incision made along equator of abscess Complexity: Complex  Blunt dissection to break up loculations  Drainage: Copious Purulent drainage  Drainage amount: Copious  Patient tolerance: Patient tolerated the procedure well with no immediate complications   DIAGNOSTIC STUDIES: Oxygen Saturation is 100% on room air, normal by my interpretation.    COORDINATION OF CARE:  13:19-Discussed planned course of treatment with the patient including I & D, who is agreeable at this time.     Labs Reviewed - No data to display No results found.   No diagnosis found.    MDM  Plan prescription Percocet recheck wound in 2 days  Diagnosis complex abscess right axilla   I personally performed the services described in this documentation, which was scribed in my presence. The recorded information has been reviewed and considered.     Doug Sou, MD 08/10/12 1452

## 2012-08-10 NOTE — ED Notes (Signed)
8/20  Pt. Called back on VM and said she is doing OK but still in pain.  States she is supposed to be coming back tomorrow and can get her culture report then.  States her R arm is still swollen and painful.  8/21 Pt. came to [facility] and was transferred to ED.

## 2012-08-10 NOTE — ED Notes (Signed)
Pt was seen at [facility] on 8/18 for abscess in bilateral axilla.  Pt had abscess drained and packed.  Pt reporting pain 10 out of 10 in her right arm, unable to move or lift right arm due to pain.  Purulent drainage noted from both sites.  Reported by: Ali Lowe, RN

## 2012-08-10 NOTE — ED Provider Notes (Addendum)
History     CSN: [id number]  Arrival date & time 08/10/12  1128   First MD Initiated Contact with Patient 08/10/12 1148      Chief Complaint  Patient presents with  . Wound Check    (Consider location/radiation/quality/duration/timing/severity/associated sxs/prior treatment) HPI Comments: Patient presents to urgent care this morning with increased discomfort on her right armpit. To the point that now she is unable to move arm. She describes that the left abscess it's resolving and feels much better she is reporting 10 out of 10 pain on her right axillary area. She denies any fevers or chills, no changes in appetite or further constitutional symptoms. She feels the area has enlarged in size and is much more tender with even minimal movement now.  Patient is a 26 y.o. female presenting with wound check. The history is provided by the patient.  Wound Check  She was treated in the ED 5 to 10 days ago. Treatments since wound repair include oral antibiotics. Her temperature was unmeasured prior to arrival. The redness has worsened. There is new swelling present. The pain has worsened. She is unable to move the affected extremity or digit.    Past Medical History  Diagnosis Date  . Chlamydia   . Trichomonas   . Preterm labor   . Headache   . UTI (lower urinary tract infection)   . BV (bacterial vaginosis)   . Yeast vaginitis     Past Surgical History  Procedure Date  . Cyst removed from l buttock   . Cesarean section   . Wisdom tooth extraction   . Wisdom tooth extraction 2009  . Cystectomy 2009    left buttock  . Cesarean section 04/18/2012    Procedure: CESAREAN SECTION;  Surgeon: Lesly Dukes, MD;  Location: [facility];  Service: Gynecology;  Laterality: N/A;  repeat    Family History  Problem Relation Age of Onset  . Hypertension Mother     History  Substance Use Topics  . Smoking status: Never Smoker   . Smokeless tobacco: Never Used  . Alcohol Use: 0.5 oz/week    1  drink(s) per week     "Awhile back"    OB History    Grav Para Term Preterm Abortions TAB SAB Ect Mult Living   3 2 0 2 1 0 1 0 0 2       Review of Systems  Constitutional: Negative for chills, activity change and appetite change.  Skin: Positive for wound.    Allergies  Review of patient's allergies indicates no known allergies.  Home Medications   Current Outpatient Rx  Name Route Sig Dispense Refill  . HYDROCODONE-ACETAMINOPHEN 5-500 MG PO TABS Oral Take 1-2 tablets by mouth every 6 (six) hours as needed for pain. 15 tablet 0  . NORETHINDRONE 0.35 MG PO TABS Oral Take 1 tablet by mouth daily.    . OXYCODONE-ACETAMINOPHEN 5-325 MG PO TABS Oral Take 2 tablets by mouth every 4 (four) hours as needed for pain. 16 tablet 0    BP 129/82  Pulse 105  Temp 99.2 F (37.3 C) (Oral)  Resp 17  SpO2 100%  Physical Exam  Nursing note and vitals reviewed. Constitutional: Vital signs are normal.  Non-toxic appearance. She does not have a sickly appearance. She does not appear ill. She appears distressed.  Musculoskeletal: She exhibits tenderness.  Skin: There is erythema.       ED Course  Procedures (including critical care time)  Labs Reviewed -  No data to display No results found.   No diagnosis found.    MDM  Return visit to urgent care for worsening right axillary abscess. Patient during exam as noted by the same observant that area looks much more swollen and patient at this point its unable to move her arm and remarkable discomfort. Left axillary abscess is resolving much improved. Given the extension of the right axillary abscess that we'll transfer patient to the emergency department for more aggressive surgical management.        Jimmie Molly, MD 08/10/12 2055  Jimmie Molly, MD 08/10/12 2056

## 2012-08-10 NOTE — ED Notes (Signed)
Pt c/o abscess to right axillary region; pt sent from [facility] because has grown in size and has more pain

## 2012-09-15 ENCOUNTER — Emergency Department (HOSPITAL_COMMUNITY)
Admission: EM | Admit: 2012-09-15 | Discharge: 2012-09-15 | Disposition: A | Discharge status: Home / Self Care | Payer: Medicaid Other | Source: Home / Self Care | Attending: Emergency Medicine | Admitting: Emergency Medicine

## 2012-09-15 ENCOUNTER — Encounter (HOSPITAL_COMMUNITY): Payer: Self-pay | Admitting: *Deleted

## 2012-09-15 DIAGNOSIS — L02419 Cutaneous abscess of limb, unspecified: Secondary | ICD-10-CM

## 2012-09-15 DIAGNOSIS — L301 Dyshidrosis [pompholyx]: Secondary | ICD-10-CM | POA: Diagnosis not present

## 2012-09-15 DIAGNOSIS — IMO0002 Reserved for concepts with insufficient information to code with codable children: Secondary | ICD-10-CM

## 2012-09-15 MED ORDER — SULFAMETHOXAZOLE-TRIMETHOPRIM 800-160 MG PO TABS: 1.0000 | ORAL_TABLET | Freq: Two times a day (BID) | ORAL | Status: AC

## 2012-09-15 MED ORDER — HYDROCODONE-ACETAMINOPHEN 5-500 MG PO TABS: 1.0000 | ORAL_TABLET | Freq: Four times a day (QID) | ORAL | Status: DC | PRN

## 2012-09-15 MED ORDER — MOMETASONE FUROATE 0.1 % EX CREA: TOPICAL_CREAM | Freq: Every day | CUTANEOUS | Status: DC

## 2012-09-15 NOTE — ED Notes (Signed)
Pt  Reports        boil under l  Armpit  Which  She  Reports  She  Has  Had  For about 4  Days   She     Reports  She  Has  Had  Similar  Boils such  As this  In the past    - she  Is  Sitting upright on  The  Exam table   Speaking in  Complete  sentances

## 2012-09-15 NOTE — ED Provider Notes (Signed)
History     CSN: [id number]  Arrival date & time 09/15/12  1303   First MD Initiated Contact with Patient 09/15/12 1402      Chief Complaint  Patient presents with  . Recurrent Skin Infections    (Consider location/radiation/quality/duration/timing/severity/associated sxs/prior treatment) HPI Comments: Patient presents urgent care with a new lead develop left armpit infection. Describing this particular swelling and tenderness started about 4 days ago and is becoming again a no other boil or infection. During the month of August she also experienced bilateral arm pit infections and needed to be I+D  here at urgent care and in the emergency department. It's tender at touch and with movement. She also describes a bit of tenderness in the right armpit but she does not want this want to be poked or explored. She denies any constitutional symptoms such as fevers, myalgias arthralgias headaches or changes in appetite.  The history is provided by the patient.    Past Medical History  Diagnosis Date  . Chlamydia   . Trichomonas   . Preterm labor   . Headache   . UTI (lower urinary tract infection)   . BV (bacterial vaginosis)   . Yeast vaginitis     Past Surgical History  Procedure Date  . Cyst removed from l buttock   . Cesarean section   . Wisdom tooth extraction   . Wisdom tooth extraction 2009  . Cystectomy 2009    left buttock  . Cesarean section 04/18/2012    Procedure: CESAREAN SECTION;  Surgeon: Lesly Dukes, MD;  Location: [facility];  Service: Gynecology;  Laterality: N/A;  repeat    Family History  Problem Relation Age of Onset  . Hypertension Mother     History  Substance Use Topics  . Smoking status: Never Smoker   . Smokeless tobacco: Never Used  . Alcohol Use: 0.5 oz/week    1 drink(s) per week     "Awhile back"    OB History    Grav Para Term Preterm Abortions TAB SAB Ect Mult Living   3 2 0 2 1 0 1 0 0 2       Review of Systems  Constitutional:  Positive for activity change. Negative for fever, chills, fatigue and unexpected weight change.  Respiratory: Negative for cough and shortness of breath.   Skin: Negative for color change, pallor, rash and wound.    Allergies  Review of patient's allergies indicates no known allergies.  Home Medications   Current Outpatient Rx  Name Route Sig Dispense Refill  . HYDROCODONE-ACETAMINOPHEN 5-500 MG PO TABS Oral Take 1-2 tablets by mouth every 6 (six) hours as needed for pain. 15 tablet 0  . NORETHINDRONE 0.35 MG PO TABS Oral Take 1 tablet by mouth daily.    . SULFAMETHOXAZOLE-TRIMETHOPRIM 800-160 MG PO TABS Oral Take 1 tablet by mouth 2 (two) times daily. 14 tablet 0    BP 120/72  Pulse 72  Temp 98.6 F (37 C) (Oral)  Resp 18  SpO2 100%  LMP 08/28/2012  Physical Exam  Nursing note and vitals reviewed. Constitutional: She appears well-developed and well-nourished.  Musculoskeletal: She exhibits tenderness.       Arms: Neurological: She is alert.  Skin: Skin is warm.    ED Course  INCISION AND DRAINAGE Performed by: Chenell Lozon Authorized by: Jimmie Molly Consent: Verbal consent obtained. Written consent obtained. Consent given by: patient Patient understanding: patient states understanding of the procedure being performed Patient identity confirmed: verbally with  patient Type: abscess Body area: upper extremity Location details: left arm Anesthesia: local infiltration Local anesthetic: lidocaine 2% with epinephrine Anesthetic total: 8 ml Scalpel size: 15 Needle gauge: 22 Incision type: elliptical Complexity: simple Drainage: purulent Drainage amount: moderate Wound treatment: wound left open Packing material: 1/2 in iodoform gauze Patient tolerance: Patient tolerated the procedure well with no immediate complications.   (including critical care time)  Labs Reviewed - No data to display No results found.   1. Abscess of axillary region       MDM    Abscess left axilla. _i+d prescribed Bactrim DS plus provided with Lortab 10 tablets for pain management instructed to return in 48 hours for packing removal and wound recheck. Patient agrees and understands treatment plan and followup care as planned.        Jimmie Molly, MD 09/15/12 [phone]

## 2012-09-19 LAB — CULTURE, ROUTINE-ABSCESS

## 2012-09-19 NOTE — ED Notes (Signed)
Abscess culture L arm axilla: mod. Proteus Mirabilis.  Pt. adequately treated with Bactrim DS. Beth Gibson 09/19/2012

## 2012-10-05 ENCOUNTER — Emergency Department (INDEPENDENT_AMBULATORY_CARE_PROVIDER_SITE_OTHER)
Admission: EM | Admit: 2012-10-05 | Discharge: 2012-10-05 | Disposition: A | Discharge status: Home / Self Care | Payer: Medicaid Other | Source: Home / Self Care

## 2012-10-05 ENCOUNTER — Encounter (HOSPITAL_COMMUNITY): Payer: Self-pay

## 2012-10-05 DIAGNOSIS — H109 Unspecified conjunctivitis: Secondary | ICD-10-CM

## 2012-10-05 MED ORDER — POLYMYXIN B-TRIMETHOPRIM 10000-0.1 UNIT/ML-% OP SOLN: 1.0000 [drp] | Freq: Four times a day (QID) | OPHTHALMIC | Status: DC

## 2012-10-05 NOTE — ED Provider Notes (Signed)
CC:    Eye infection.  HPI: 26 y.o. female with left eye redness for 6 days. Stuck shut in AM. Leaking watery discharge, esp from inner corner. Rubbing eyes a lot. Now very sore at inner fold. Burning sensation in bright light, blurry vision occasionally. Using Visine drops (for red eyes) for a few days but discontinued as did not help. No injury. No foreign body sensation. No sick contacts. Has two small children at home who are not in daycare.  PMH:  None PSH:  c-section Fam Hx:  HTN mom Soc:  Non-smoker, no EtOH, no drugs  ROS:  No fever, chills, cough, runny nose or sore throat  EXAM: Filed Vitals:   10/05/12 1734  BP: 123/79  Pulse: 85  Temp: 99.1 F (37.3 C)  Resp: 17    GEN:  WNWD, no acute distress HEENT:  NCAT. OP clear. Left eye - conjunctival erythema/injection. Skin at nasal corner red and raw. Watery/clear mucous film. Left pre-auricular lymph node <1 cm.  Vision grossly intact - equal to R eye reading. NECK:  Supple no lymphadenopathy CV:/RESP:   RRR, no murmur, CTAB  A/P 26 y.o. female with conjunctivitis. Likely viral but now 6 days and worsening. PolyTrim eyedrops prescribed. Precautions regarding handwashing, keeping clean, seeking medical care for change in vision/purulent discharge.  Napoleon Form, MD   Napoleon Form, MD 10/05/12 [phone]

## 2012-10-05 NOTE — ED Notes (Signed)
Pt c/o red, itchy watery left eye x 6 days

## 2012-10-27 ENCOUNTER — Encounter (HOSPITAL_COMMUNITY): Payer: Self-pay | Admitting: Emergency Medicine

## 2012-10-27 ENCOUNTER — Emergency Department (HOSPITAL_COMMUNITY)
Admission: EM | Admit: 2012-10-27 | Discharge: 2012-10-27 | Disposition: A | Discharge status: Home / Self Care | Payer: Medicaid Other | Attending: Emergency Medicine | Admitting: Emergency Medicine

## 2012-10-27 DIAGNOSIS — Z8742 Personal history of other diseases of the female genital tract: Secondary | ICD-10-CM | POA: Insufficient documentation

## 2012-10-27 DIAGNOSIS — Z8619 Personal history of other infectious and parasitic diseases: Secondary | ICD-10-CM | POA: Insufficient documentation

## 2012-10-27 DIAGNOSIS — Z87440 Personal history of urinary (tract) infections: Secondary | ICD-10-CM | POA: Insufficient documentation

## 2012-10-27 DIAGNOSIS — L0100 Impetigo, unspecified: Secondary | ICD-10-CM | POA: Insufficient documentation

## 2012-10-27 MED ORDER — MUPIROCIN CALCIUM 2 % EX CREA: TOPICAL_CREAM | Freq: Three times a day (TID) | CUTANEOUS | Status: DC

## 2012-10-27 MED ORDER — AMOXICILLIN-POT CLAVULANATE 875-125 MG PO TABS: 1.0000 | ORAL_TABLET | Freq: Two times a day (BID) | ORAL | Status: DC

## 2012-10-27 NOTE — ED Notes (Signed)
Pt assessed and discharged by [facility]

## 2012-10-27 NOTE — ED Provider Notes (Signed)
History     CSN: [id number]  Arrival date & time 10/27/12  0865   First MD Initiated Contact with Patient 10/27/12 [phone]      Chief Complaint  Patient presents with  . Rash    (Consider location/radiation/quality/duration/timing/severity/associated sxs/prior treatment) HPI Patient presents to the  emergency Department with  several areas on her scalp, forehead and nose.  The patient states that these areas appear to have some material draining from them.  Patient states that she was worried that it may be a fungal infection since her daughters have been previously in her scalp.The patient states that she has been using Selsun Blue shampoo for the last few days. Past Medical History  Diagnosis Date  . Chlamydia   . Trichomonas   . Preterm labor   . Headache   . UTI (lower urinary tract infection)   . BV (bacterial vaginosis)   . Yeast vaginitis     Past Surgical History  Procedure Date  . Cyst removed from l buttock   . Cesarean section   . Wisdom tooth extraction   . Wisdom tooth extraction 2009  . Cystectomy 2009    left buttock  . Cesarean section 04/18/2012    Procedure: CESAREAN SECTION;  Surgeon: Lesly Dukes, MD;  Location: [facility];  Service: Gynecology;  Laterality: N/A;  repeat    Family History  Problem Relation Age of Onset  . Hypertension Mother     History  Substance Use Topics  . Smoking status: Never Smoker   . Smokeless tobacco: Never Used  . Alcohol Use: 0.5 oz/week    1 drink(s) per week     Comment: "Awhile back"    OB History    Grav Para Term Preterm Abortions TAB SAB Ect Mult Living   3 2 0 2 1 0 1 0 0 2       Review of Systems  Allergies  Review of patient's allergies indicates no known allergies.  Home Medications   Current Outpatient Rx  Name  Route  Sig  Dispense  Refill  . IBUPROFEN 800 MG PO TABS   Oral   Take 800 mg by mouth every 8 (eight) hours as needed. For pain.           BP 129/80  Pulse 86  Temp 98.3 F  (36.8 C) (Oral)  Resp 18  Ht 5\' 8"  (1.727 m)  Wt 201 lb (91.173 kg)  BMI 30.56 kg/m2  SpO2 97%  LMP 10/25/2012  Breastfeeding? No  Physical Exam  Constitutional: She appears well-developed and well-nourished. No distress.  HENT:  Head: Normocephalic and atraumatic.    Cardiovascular: Normal rate, regular rhythm and normal heart sounds.   Pulmonary/Chest: Effort normal and breath sounds normal.  Skin: Skin is warm and dry.    ED Course  Procedures (including critical care time)  The patient's scalp does not have any areas that are draining  Or open.  I do not see any rash to the scalp.  The patient may have an impetigo-type on her face and nose.   MDM          Carlyle Dolly, PA-C 10/27/12 [phone]

## 2012-10-27 NOTE — ED Notes (Signed)
Patient states her children have been diagnosed with this rash in the past.  Patient states blisters with leakage and itching around scalp and on face began about a week ago.  Patient denies fevers or SOB.

## 2012-10-28 NOTE — ED Provider Notes (Signed)
Medical screening examination/treatment/procedure(s) were performed by non-physician practitioner and as supervising physician I was immediately available for consultation/collaboration.  Brendi Mccarroll R. Kodie Kishi, MD 10/28/12 0654 

## 2013-03-08 ENCOUNTER — Encounter (HOSPITAL_COMMUNITY): Payer: Self-pay | Admitting: *Deleted

## 2013-03-08 ENCOUNTER — Emergency Department (HOSPITAL_COMMUNITY)
Admission: EM | Admit: 2013-03-08 | Discharge: 2013-03-08 | Disposition: A | Discharge status: Home / Self Care | Payer: Medicaid Other | Source: Home / Self Care | Attending: Family Medicine | Admitting: Family Medicine

## 2013-03-08 ENCOUNTER — Other Ambulatory Visit (HOSPITAL_COMMUNITY)
Admission: RE | Admit: 2013-03-08 | Discharge: 2013-03-08 | Disposition: A | Discharge status: Home / Self Care | Payer: Medicaid Other | Source: Ambulatory Visit | Attending: Family Medicine | Admitting: Family Medicine

## 2013-03-08 DIAGNOSIS — Z202 Contact with and (suspected) exposure to infections with a predominantly sexual mode of transmission: Secondary | ICD-10-CM

## 2013-03-08 DIAGNOSIS — Z113 Encounter for screening for infections with a predominantly sexual mode of transmission: Secondary | ICD-10-CM | POA: Insufficient documentation

## 2013-03-08 DIAGNOSIS — N898 Other specified noninflammatory disorders of vagina: Secondary | ICD-10-CM

## 2013-03-08 DIAGNOSIS — N760 Acute vaginitis: Secondary | ICD-10-CM | POA: Insufficient documentation

## 2013-03-08 LAB — POCT URINALYSIS DIP (DEVICE)
Bilirubin Urine: NEGATIVE
Glucose, UA: 100 mg/dL — AB
Hgb urine dipstick: NEGATIVE
Nitrite: NEGATIVE
Urobilinogen, UA: 4 mg/dL — ABNORMAL HIGH (ref 0.0–1.0)
pH: 7 (ref 5.0–8.0)

## 2013-03-08 MED ORDER — AZITHROMYCIN 250 MG PO TABS
1000.0000 mg | ORAL_TABLET | Freq: Every day | ORAL | Status: DC
  Administered 2013-03-08: 1000 mg via ORAL

## 2013-03-08 MED ORDER — CEFTRIAXONE SODIUM 250 MG IJ SOLR
250.0000 mg | Freq: Once | INTRAMUSCULAR | Status: AC
  Administered 2013-03-08: 250 mg via INTRAMUSCULAR

## 2013-03-08 MED ORDER — AZITHROMYCIN 250 MG PO TABS
ORAL_TABLET | ORAL | Status: AC
  Filled 2013-03-08: qty 4

## 2013-03-08 MED ORDER — FLUCONAZOLE 150 MG PO TABS: 150.0000 mg | ORAL_TABLET | Freq: Every day | ORAL | Status: AC

## 2013-03-08 MED ORDER — METRONIDAZOLE 500 MG PO TABS: 500.0000 mg | ORAL_TABLET | Freq: Two times a day (BID) | ORAL | Status: DC

## 2013-03-08 MED ORDER — CEFTRIAXONE SODIUM 250 MG IJ SOLR
INTRAMUSCULAR | Status: AC
  Filled 2013-03-08: qty 250

## 2013-03-08 NOTE — ED Notes (Signed)
Pt  Reports    Was   Exposed  To  An  Std           She  Reports  A  Vaginal  Discharge   -  She  Has  No pain    She  Ambulates  With a   Slow  Steady gait

## 2013-03-08 NOTE — ED Provider Notes (Signed)
History     CSN: [id number]  Arrival date & time 03/08/13  1131   First MD Initiated Contact with Patient 03/08/13 1139      Chief Complaint  Patient presents with  . Vaginal Discharge    (Consider location/radiation/quality/duration/timing/severity/associated sxs/prior treatment) HPI Comments: 27 year old female here complaining of vaginal discharge for over a week. She is concerned as her sexual partner was diagnosed with urethritis 4 days ago. Patient denies fever or chills. No pelvic pain. No headache or general malaise. Pregnancy test negative. Patient has had a prior history of Chlamydia, Trichomonas and recurrent BV and yeast related vaginitis. Patient declines pelvic exam as she denies pelvic pain and states she wants to be treated.   Past Medical History  Diagnosis Date  . Chlamydia   . Trichomonas   . Preterm labor   . Headache   . UTI (lower urinary tract infection)   . BV (bacterial vaginosis)   . Yeast vaginitis     Past Surgical History  Procedure Laterality Date  . Cyst removed from l buttock    . Cesarean section    . Wisdom tooth extraction    . Wisdom tooth extraction  2009  . Cystectomy  2009    left buttock  . Cesarean section  04/18/2012    Procedure: CESAREAN SECTION;  Surgeon: Lesly Dukes, MD;  Location: [facility];  Service: Gynecology;  Laterality: N/A;  repeat    Family History  Problem Relation Age of Onset  . Hypertension Mother     History  Substance Use Topics  . Smoking status: Never Smoker   . Smokeless tobacco: Never Used  . Alcohol Use: 0.5 oz/week    1 drink(s) per week     Comment: "Awhile back"    OB History   Grav Para Term Preterm Abortions TAB SAB Ect Mult Living   3 2 0 2 1 0 1 0 0 2       Review of Systems  Constitutional: Negative for fever and chills.  HENT: Negative for sore throat.   Gastrointestinal: Negative for abdominal pain.  Genitourinary: Positive for vaginal discharge. Negative for dysuria, vaginal  bleeding, genital sores, vaginal pain and pelvic pain.  Skin: Negative for rash.  Neurological: Negative for dizziness and headaches.    Allergies  Review of patient's allergies indicates no known allergies.  Home Medications   Current Outpatient Rx  Name  Route  Sig  Dispense  Refill  . fluconazole (DIFLUCAN) 150 MG tablet   Oral   Take 1 tablet (150 mg total) by mouth daily.   1 tablet   0     BP 123/71  Pulse 92  Temp(Src) 98.4 F (36.9 C) (Oral)  Resp 16  SpO2 100%  Physical Exam  Nursing note and vitals reviewed. Constitutional: She is oriented to person, place, and time. She appears well-developed and well-nourished. No distress.  HENT:  Head: Normocephalic and atraumatic.  Mouth/Throat: No oropharyngeal exudate.  Eyes: Conjunctivae are normal. No scleral icterus.  Neck: No thyromegaly present.  Cardiovascular: Normal heart sounds.   Pulmonary/Chest: Breath sounds normal.  Abdominal: Soft. Bowel sounds are normal. She exhibits no distension. There is no tenderness.  Genitourinary:  deferred  Lymphadenopathy:    She has no cervical adenopathy.  Neurological: She is alert and oriented to person, place, and time.  Skin: No rash noted. She is not diaphoretic.    ED Course  Procedures (including critical care time)  Labs Reviewed  POCT URINALYSIS DIP (  DEVICE) - Abnormal; Notable for the following:    Glucose, UA 100 (*)    Protein, ur 30 (*)    Urobilinogen, UA 4.0 (*)    Leukocytes, UA TRACE (*)    All other components within normal limits  POCT PREGNANCY, URINE  URINE CYTOLOGY ANCILLARY ONLY   No results found.   1. Vaginal discharge   2. Exposure to STD       MDM  27 year old female with past history of Chlamydia. Here with vaginal discharge exposed to sexual partner who was treated and diagnose with urethritis 5 days ago. Germ. Go with Rocephin 250 mg IM x1 and a azithromycin 1 g oral x1 here. Prescribed Flagyl and Diflucan. Encourage  condom use. Red flags that should prompt her return to medical attention discussed with patient and provided in writing.        Sharin Grave, MD 03/09/13 1708

## 2013-03-10 ENCOUNTER — Telehealth (HOSPITAL_COMMUNITY): Payer: Self-pay | Admitting: *Deleted

## 2013-03-10 NOTE — ED Notes (Signed)
Dr. Alfonse Ras asked me to notify pt. that she was adequately treated with Flagyl for bacterial vaginosis and rest of labs were normal.  I called and left a message to call. Beth Gibson 03/10/2013

## 2013-03-10 NOTE — ED Notes (Signed)
Pt. called back.  Pt. verified x 2 and given results. Pt. told she was adequately treated with Flagyl for bacterial vaginosis. Vassie Moselle 03/10/2013

## 2013-03-10 NOTE — ED Notes (Signed)
GC/Chlamydia/Trich neg., Affirm: Gardnerella, Prevotella Bivia and Mobiluncus Mulieris pos.  Pt. Adequately treated with Flagyl.  Labs shown to Dr. Tressia Danas. Vassie Moselle 03/10/2013

## 2013-06-09 ENCOUNTER — Encounter (HOSPITAL_COMMUNITY): Payer: Self-pay | Admitting: Emergency Medicine

## 2013-06-09 ENCOUNTER — Emergency Department (HOSPITAL_COMMUNITY)
Admission: EM | Admit: 2013-06-09 | Discharge: 2013-06-09 | Disposition: A | Discharge status: Home / Self Care | Payer: Medicaid Other | Attending: Emergency Medicine | Admitting: Emergency Medicine

## 2013-06-09 DIAGNOSIS — L0231 Cutaneous abscess of buttock: Secondary | ICD-10-CM | POA: Insufficient documentation

## 2013-06-09 DIAGNOSIS — N760 Acute vaginitis: Secondary | ICD-10-CM | POA: Insufficient documentation

## 2013-06-09 DIAGNOSIS — N898 Other specified noninflammatory disorders of vagina: Secondary | ICD-10-CM | POA: Insufficient documentation

## 2013-06-09 DIAGNOSIS — B9689 Other specified bacterial agents as the cause of diseases classified elsewhere: Secondary | ICD-10-CM | POA: Insufficient documentation

## 2013-06-09 DIAGNOSIS — A499 Bacterial infection, unspecified: Secondary | ICD-10-CM | POA: Insufficient documentation

## 2013-06-09 DIAGNOSIS — Z3202 Encounter for pregnancy test, result negative: Secondary | ICD-10-CM | POA: Insufficient documentation

## 2013-06-09 DIAGNOSIS — Z8619 Personal history of other infectious and parasitic diseases: Secondary | ICD-10-CM | POA: Insufficient documentation

## 2013-06-09 DIAGNOSIS — R112 Nausea with vomiting, unspecified: Secondary | ICD-10-CM | POA: Insufficient documentation

## 2013-06-09 DIAGNOSIS — Z9889 Other specified postprocedural states: Secondary | ICD-10-CM | POA: Insufficient documentation

## 2013-06-09 DIAGNOSIS — M549 Dorsalgia, unspecified: Secondary | ICD-10-CM | POA: Insufficient documentation

## 2013-06-09 DIAGNOSIS — A599 Trichomoniasis, unspecified: Secondary | ICD-10-CM | POA: Insufficient documentation

## 2013-06-09 DIAGNOSIS — L0291 Cutaneous abscess, unspecified: Secondary | ICD-10-CM

## 2013-06-09 DIAGNOSIS — Z87440 Personal history of urinary (tract) infections: Secondary | ICD-10-CM | POA: Insufficient documentation

## 2013-06-09 LAB — URINALYSIS, ROUTINE W REFLEX MICROSCOPIC
Glucose, UA: NEGATIVE mg/dL
Hgb urine dipstick: NEGATIVE
Ketones, ur: NEGATIVE mg/dL
Protein, ur: NEGATIVE mg/dL
Urobilinogen, UA: 1 mg/dL (ref 0.0–1.0)

## 2013-06-09 LAB — CBC WITH DIFFERENTIAL/PLATELET
Basophils Absolute: 0 10*3/uL (ref 0.0–0.1)
Basophils Relative: 0 % (ref 0–1)
Eosinophils Absolute: 0.2 10*3/uL (ref 0.0–0.7)
Eosinophils Relative: 3 % (ref 0–5)
HCT: 29.4 % — ABNORMAL LOW (ref 36.0–46.0)
MCH: 18.3 pg — ABNORMAL LOW (ref 26.0–34.0)
MCHC: 29.9 g/dL — ABNORMAL LOW (ref 30.0–36.0)
MCV: 61 fL — ABNORMAL LOW (ref 78.0–100.0)
Monocytes Absolute: 0.4 10*3/uL (ref 0.1–1.0)
Platelets: 229 10*3/uL (ref 150–400)
RDW: 18.7 % — ABNORMAL HIGH (ref 11.5–15.5)

## 2013-06-09 LAB — POCT PREGNANCY, URINE: Preg Test, Ur: NEGATIVE

## 2013-06-09 LAB — BASIC METABOLIC PANEL
BUN: 8 mg/dL (ref 6–23)
Calcium: 9.5 mg/dL (ref 8.4–10.5)
Creatinine, Ser: 0.69 mg/dL (ref 0.50–1.10)
GFR calc non Af Amer: >90 mL/min (ref >90)
Glucose, Bld: 93 mg/dL (ref 70–99)
Potassium: 3.5 mEq/L (ref 3.5–5.1)

## 2013-06-09 LAB — WET PREP, GENITAL

## 2013-06-09 MED ORDER — ONDANSETRON 8 MG PO TBDP
8.0000 mg | ORAL_TABLET | Freq: Once | ORAL | Status: AC
  Administered 2013-06-09: 8 mg via ORAL
  Filled 2013-06-09: qty 1

## 2013-06-09 MED ORDER — OXYCODONE-ACETAMINOPHEN 5-325 MG PO TABS: 2.0000 | ORAL_TABLET | Freq: Four times a day (QID) | ORAL | Status: DC | PRN

## 2013-06-09 MED ORDER — CEPHALEXIN 500 MG PO CAPS: 500.0000 mg | ORAL_CAPSULE | Freq: Four times a day (QID) | ORAL | Status: DC

## 2013-06-09 MED ORDER — PROMETHAZINE HCL 25 MG PO TABS: 25.0000 mg | ORAL_TABLET | Freq: Four times a day (QID) | ORAL | Status: DC | PRN

## 2013-06-09 MED ORDER — METRONIDAZOLE 500 MG PO TABS: 500.0000 mg | ORAL_TABLET | Freq: Two times a day (BID) | ORAL | Status: DC

## 2013-06-09 MED ORDER — SULFAMETHOXAZOLE-TRIMETHOPRIM 800-160 MG PO TABS: 1.0000 | ORAL_TABLET | Freq: Two times a day (BID) | ORAL | Status: DC

## 2013-06-09 MED ORDER — OXYCODONE-ACETAMINOPHEN 5-325 MG PO TABS
2.0000 | ORAL_TABLET | Freq: Once | ORAL | Status: AC
  Administered 2013-06-09: 2 via ORAL
  Filled 2013-06-09: qty 2

## 2013-06-09 NOTE — ED Notes (Signed)
Pt from home reports that she was bitten on her back, right above her bottom by something. After the bite, she started experiencing abd and back pain. Pt taking ibuprofen for pain with no relief. Pt does not know what bit her. Pt reports also that she had vomiting before the bite, but denies diarrhea or fever. Pt adds that the insect bite is very swollen and painful. Pt denies rash or itching. Pt in NAD and A&O.

## 2013-06-09 NOTE — ED Provider Notes (Signed)
History     CSN: [id number]  Arrival date & time 06/09/13  1124   First MD Initiated Contact with Patient 06/09/13 1127      Chief Complaint  Patient presents with  . Abdominal Pain  . Insect Bite    (Consider location/radiation/quality/duration/timing/severity/associated sxs/prior treatment) HPI Comments: Patient is a 27 year old female who presents today with low abdominal cramping associated with vomiting and pain in her gluteal folds. She has been having gradually worsening abdominal cramping and vomiting for 3 days. Vomit is nonbloody. She believes she was bit by a bug a 2 days ago. The pain in her backside is sharp and nonradiating. Sitting makes the pain worse.  She has been taking ibuprofen with no relief. She is sexually active. Last menstral period was 2 weeks ago. No vaginal bleeding. She admits to a white vaginal discharge. No fevers, chills, diarrhea, constipation (last BM 2 days ago).     The history is provided by the patient. No language interpreter was used.    Past Medical History  Diagnosis Date  . Chlamydia   . Trichomonas   . Preterm labor   . Headache(784.0)   . UTI (lower urinary tract infection)   . BV (bacterial vaginosis)   . Yeast vaginitis     Past Surgical History  Procedure Laterality Date  . Cyst removed from l buttock    . Cesarean section    . Wisdom tooth extraction    . Wisdom tooth extraction  2009  . Cystectomy  2009    left buttock  . Cesarean section  04/18/2012    Procedure: CESAREAN SECTION;  Surgeon: Lesly Dukes, MD;  Location: [facility];  Service: Gynecology;  Laterality: N/A;  repeat    Family History  Problem Relation Age of Onset  . Hypertension Mother     History  Substance Use Topics  . Smoking status: Never Smoker   . Smokeless tobacco: Never Used  . Alcohol Use: 0.5 oz/week    1 drink(s) per week     Comment: "Awhile back"    OB History   Grav Para Term Preterm Abortions TAB SAB Ect Mult Living   3 2 0 2 1  0 1 0 0 2      Review of Systems  Constitutional: Negative for fever and chills.  Gastrointestinal: Positive for nausea and abdominal pain.  Skin: Positive for wound.  All other systems reviewed and are negative.    Allergies  Review of patient's allergies indicates no known allergies.  Home Medications   Current Outpatient Rx  Name  Route  Sig  Dispense  Refill  . metroNIDAZOLE (FLAGYL) 500 MG tablet   Oral   Take 1 tablet (500 mg total) by mouth 2 (two) times daily.   14 tablet   0     BP 129/73  Pulse 105  Temp(Src) 98.9 F (37.2 C) (Oral)  Resp 20  SpO2 97%  LMP 05/21/2013  Breastfeeding? No  Physical Exam  Nursing note and vitals reviewed. Constitutional: She is oriented to person, place, and time. She appears well-developed and well-nourished. No distress.  HENT:  Head: Normocephalic and atraumatic.  Right Ear: External ear normal.  Left Ear: External ear normal.  Nose: Nose normal.  Mouth/Throat: Oropharynx is clear and moist.  Eyes: Conjunctivae are normal.  Neck: Normal range of motion.  Cardiovascular: Normal rate, regular rhythm, normal heart sounds, intact distal pulses and normal pulses.   Pulmonary/Chest: Effort normal and breath sounds  normal. No stridor. No respiratory distress. She has no wheezes. She has no rales.  Abdominal: Soft. Normal appearance. She exhibits no distension. There is no hepatosplenomegaly, splenomegaly or hepatomegaly. There is tenderness in the suprapubic area. There is no rigidity, no rebound, no guarding, no CVA tenderness, no tenderness at McBurney's point and negative Murphy's sign.  Genitourinary: No labial fusion. There is no rash, tenderness, lesion or injury on the right labia. There is no rash, tenderness, lesion or injury on the left labia. Cervix exhibits discharge (white discharge from cervical os). Cervix exhibits no motion tenderness and no friability. Right adnexum displays no mass, no tenderness and no fullness.  Left adnexum displays no mass, no tenderness and no fullness. No tenderness or bleeding around the vagina. No foreign body around the vagina.  Musculoskeletal: Normal range of motion.  Neurological: She is alert and oriented to person, place, and time. She has normal strength.  Skin: Skin is warm and dry. She is not diaphoretic. No erythema.  2 cm area of induration with fluctuance on right side of buttocks in gluteal cleft. No involvement with rectum.  US performed and there is a collection of pus below skin amenable to I&D.   Psychiatric: She has a normal mood and affect. Her behavior is normal.    ED Course  Procedures (including critical care time)  Labs Reviewed  WET PREP, GENITAL - Abnormal; Notable for the following:    Clue Cells Wet Prep HPF POC RARE (*)    WBC, Wet Prep HPF POC MODERATE (*)    All other components within normal limits  URINALYSIS, ROUTINE W REFLEX MICROSCOPIC - Abnormal; Notable for the following:    Color, Urine AMBER (*)    APPearance CLOUDY (*)    Specific Gravity, Urine 1.037 (*)    Bilirubin Urine SMALL (*)    All other components within normal limits  CBC WITH DIFFERENTIAL - Abnormal; Notable for the following:    Hemoglobin 8.8 (*)    HCT 29.4 (*)    MCV 61.0 (*)    MCH 18.3 (*)    MCHC 29.9 (*)    RDW 18.7 (*)    All other components within normal limits  GC/CHLAMYDIA PROBE AMP  BASIC METABOLIC PANEL  POCT PREGNANCY, URINE   No results found.  INCISION AND DRAINAGE Performed by: Junious Silk Consent: Verbal consent obtained. Risks and benefits: risks, benefits and alternatives were discussed Type: abscess  Body area: right buttocks just lateral to gluteal fold  Anesthesia: local infiltration  Incision was made with a scalpel.  Local anesthetic: lidocaine 2% 1 epinephrine  Anesthetic total: 2 ml  Complexity: complex Blunt dissection to break up loculations  Drainage: purulent  Drainage amount: mild  Packing material: 1/4  in iodoform gauze  Patient tolerance: Patient tolerated the procedure well with no immediate complications.     1. Abscess and cellulitis   2. Bacterial vaginosis       MDM  Patient with skin abscess amenable to incision and drainage.  Abscess was packed,  wound recheck in 2 days. Encouraged home warm soaks and flushing.  Signs of cellulitis is surrounding skin.  Will d/c to home.  Covered with Bactrim and Keflex. Patient also had cramping in suprapubic region with white discharge. Found to have BV. Will treat with flagyl. Pregnancy negative. No concern for TOA, torsion, PID, ectopic. Return instructions given. Vital signs stable for discharge. Patient / Family / Caregiver informed of clinical course, understand medical decision-making process, and  agree with plan.          Mora Bellman, PA-C 06/10/13 1535

## 2013-06-09 NOTE — ED Notes (Signed)
Blood collected by RN Tuesday

## 2013-06-10 LAB — GC/CHLAMYDIA PROBE AMP: GC Probe RNA: NEGATIVE

## 2013-06-12 NOTE — ED Provider Notes (Signed)
Medical screening examination/treatment/procedure(s) were performed by non-physician practitioner and as supervising physician I was immediately available for consultation/collaboration.   Laray Anger, DO 06/12/13 1424

## 2014-04-10 ENCOUNTER — Encounter (HOSPITAL_COMMUNITY): Payer: Self-pay | Admitting: Emergency Medicine

## 2014-04-10 ENCOUNTER — Emergency Department (HOSPITAL_COMMUNITY)
Admission: EM | Admit: 2014-04-10 | Discharge: 2014-04-10 | Disposition: A | Discharge status: Home / Self Care | Payer: Medicaid Other | Attending: Emergency Medicine | Admitting: Emergency Medicine

## 2014-04-10 DIAGNOSIS — H109 Unspecified conjunctivitis: Secondary | ICD-10-CM | POA: Insufficient documentation

## 2014-04-10 DIAGNOSIS — Z8619 Personal history of other infectious and parasitic diseases: Secondary | ICD-10-CM | POA: Insufficient documentation

## 2014-04-10 DIAGNOSIS — Z87440 Personal history of urinary (tract) infections: Secondary | ICD-10-CM | POA: Insufficient documentation

## 2014-04-10 DIAGNOSIS — Z9109 Other allergy status, other than to drugs and biological substances: Secondary | ICD-10-CM

## 2014-04-10 DIAGNOSIS — J029 Acute pharyngitis, unspecified: Secondary | ICD-10-CM | POA: Insufficient documentation

## 2014-04-10 DIAGNOSIS — Z8751 Personal history of pre-term labor: Secondary | ICD-10-CM | POA: Insufficient documentation

## 2014-04-10 DIAGNOSIS — J309 Allergic rhinitis, unspecified: Secondary | ICD-10-CM | POA: Insufficient documentation

## 2014-04-10 DIAGNOSIS — Z79899 Other long term (current) drug therapy: Secondary | ICD-10-CM | POA: Insufficient documentation

## 2014-04-10 MED ORDER — TETRACAINE HCL 0.5 % OP SOLN
2.0000 [drp] | Freq: Once | OPHTHALMIC | Status: AC
  Administered 2014-04-10: 2 [drp] via OPHTHALMIC
  Filled 2014-04-10: qty 2

## 2014-04-10 MED ORDER — FLUORESCEIN SODIUM 1 MG OP STRP
1.0000 | ORAL_STRIP | Freq: Once | OPHTHALMIC | Status: AC
  Administered 2014-04-10: 1 via OPHTHALMIC
  Filled 2014-04-10: qty 1

## 2014-04-10 MED ORDER — POLYMYXIN B-TRIMETHOPRIM 10000-0.1 UNIT/ML-% OP SOLN: 1.0000 [drp] | Freq: Four times a day (QID) | OPHTHALMIC | Status: DC

## 2014-04-10 NOTE — Discharge Instructions (Signed)
Please call your doctor for a followup appointment within 24-48 hours. When you talk to your doctor please let them know that you were seen in the emergency department and have them acquire all of your records so that they can discuss the findings with you and formulate a treatment plan to fully care for your new and ongoing problems. Please call adn set-up an appointment with your primary care provider to be re-assessed within the next 24-48 hours.  Please call and set-up an appointment with the eye doctor and allergist Please rest and stay hydrated Please use eye drops as prescribed Please avoid eye make-up, new face washes, eye contacts - please continue to wear just glasses until infection clears Please continue to monitor symptoms closely and if symptoms are to worsen or change (fever greater than 101, chills, sweating, nausea, vomiting, neck pain, neck stiffness, swelling to the eye, redness to the eye, eye feels hot, loss of vision, vision worsens, increased discharge, injury to the eye, sudden loss of vision to the eye, numbness, tingling) please report back to the ED immediately    Conjunctivitis Conjunctivitis is commonly called "pink eye." Conjunctivitis can be caused by bacterial or viral infection, allergies, or injuries. There is usually redness of the lining of the eye, itching, discomfort, and sometimes discharge. There may be deposits of matter along the eyelids. A viral infection usually causes a watery discharge, while a bacterial infection causes a yellowish, thick discharge. Pink eye is very contagious and spreads by direct contact. You may be given antibiotic eyedrops as part of your treatment. Before using your eye medicine, remove all drainage from the eye by washing gently with warm water and cotton balls. Continue to use the medication until you have awakened 2 mornings in a row without discharge from the eye. Do not rub your eye. This increases the irritation and helps spread  infection. Use separate towels from other household members. Wash your hands with soap and water before and after touching your eyes. Use cold compresses to reduce pain and sunglasses to relieve irritation from light. Do not wear contact lenses or wear eye makeup until the infection is gone. SEEK MEDICAL CARE IF:   Your symptoms are not better after 3 days of treatment.  You have increased pain or trouble seeing.  The outer eyelids become very red or swollen. Document Released: 01/14/2005 Document Revised: 02/29/2012 Document Reviewed: 12/07/2005 [facility] Patient Information 2014 [place].   Emergency Department Resource Guide 1) Find a Doctor and Pay Out of Pocket Although you won't have to find out who is covered by your insurance plan, it is a good idea to ask around and get recommendations. You will then need to call the office and see if the doctor you have chosen will accept you as a new patient and what types of options they offer for patients who are self-pay. Some doctors offer discounts or will set up payment plans for their patients who do not have insurance, but you will need to ask so you aren't surprised when you get to your appointment.  2) Contact Your Local Health Department Not all health departments have doctors that can see patients for sick visits, but many do, so it is worth a call to see if yours does. If you don't know where your local health department is, you can check in your phone book. The CDC also has a tool to help you locate your state's health department, and many state websites also have listings of  all of their local health departments.  3) Find a [facility] If your illness is not likely to be very severe or complicated, you may want to try a walk in clinic. These are popping up all over the country in pharmacies, drugstores, and shopping centers. They're usually staffed by nurse practitioners or physician assistants that have been trained to treat  common illnesses and complaints. They're usually fairly quick and inexpensive. However, if you have serious medical issues or chronic medical problems, these are probably not your best option.  No Primary Care Doctor: - Call Health Connect at  [phone] - they can help you locate a primary care doctor that  accepts your insurance, provides certain services, etc. - Physician Referral Service- [phone]  Chronic Pain Problems: Organization         Address  Phone   Notes  [facility]  [phone] Patients need to be referred by their primary care doctor.   Medication Assistance: Organization         Address  Phone   Notes  [facility] [place] [place], [place] [address] [phone] --Must be a resident of [facility] -- Must have NO insurance coverage whatsoever (no Medicaid/ Medicare, etc.) -- The pt. MUST have a primary care doctor that directs their care regularly and follows them in the community   [facility]ist  [phone]   [employer]  [phone]    Agencies that provide inexpensive medical care: Organization         Address  Phone   Notes  [place]  [phone]   [facility]    [phone]   [facility] [place],  [phone]   [facility] [address], [place] [phone]   Planned Parenthood    [phone]   [facility]    [phone]   [place] [address], [place] Phone:  [phone], Fax:  [phone] Hours of Operation:  9 am - 6 pm, M-F.  Also accepts Medicaid/Medicare and self-pay.  [facility] for [place], [address], [place] Phone: [phone], Fax: [phone]. Hours of Operation:  8:30 am - 5:30 pm, M-F.  Also accepts Medicaid and self-pay.  [facility]  [place] [address], [place] Phone: [phone]   [place], [place], [place] [phone], Ext. [phone] Mondays & Thursdays: 7-9 AM.  First 15 patients are seen on a first come, first serve basis.    [place] Providers:  Organization         Address  Phone   Notes  [facility] [address], [place] [phone] Also accepts self-pay patients.  [facility] [address], [place]  [phone]   [place], [address], [place] [phone]   [facility] [address] [place] [phone]   Lucianne Lei [address], [place]   [phone] Only accepts [place] Access [place] patients after they have their name applied to their card.   Self-Pay (no insurance) in [facility]:  Patent attorney   Notes  Sickle Cell Patients, [facility] [place] [phone]   [facility] [place] [phone]   [facility] [place]  [address], [address], [place] [phone]   [facility]/Dr. Osei-Bonsu  [address], [place] [phone] Phone number for both [place] and [place] locations is the same.  [facility] [address] [place] [phone]   [facility] [address], [place] or [address] [phone]   [facility] [address] [place] [phone], phone; [phone], fax Sees patients 1st and 3rd Saturday of every month.  Must not qualify for public or private insurance (i.e. Medicaid, Medicare, [place] Health Choice, Veterans' Benefits)  Household income should be no more than 200% of the  poverty level The clinic cannot treat you if you are pregnant or think you are pregnant  Sexually transmitted diseases are not treated at the clinic.    Dental Care: Organization         Address  Phone  Notes  [facility] [employer] [phone] Accepts children up to age 60 who are enrolled in [place] or [place]; pregnant women with a Medicaid card; and children who have applied for Medicaid or [place] Health Choice, but were declined, whose parents can pay a reduced fee at time of service.  [facility] Department of [facility]  [address], [place] [phone] Accepts children up to age 29 who are enrolled in [place] or [place]; pregnant women with a Medicaid card; and children who have applied for Medicaid or [place] Health Choice, but were declined, whose parents can pay a reduced fee at time of service.  [place] Adult [facility] Access PROGRAM  [place] [phone] Patients are seen by appointment only. Walk-ins are not accepted. [employer] will see patients 23 years of age and older. Monday - Tuesday (8am-5pm) Most Wednesdays (8:30-5pm) $30 per visit, cash only  [facility] Adult Dental Access PROGRAM  [address], [facility] [phone] Patients are seen by appointment only. Walk-ins are not accepted. [place] will see patients 7 years of age and older. One Wednesday Evening (Monthly: Volunteer Based).  $30 per visit, cash only  [place]  [phone] for adults; Children under age 40, call Graduate Pediatric Dentistry at [phone]. Children aged 74-14, please call [phone] to request a pediatric application.  Dental services are provided in all areas of dental care including fillings, crowns and bridges, complete and partial dentures, implants, gum treatment, root canals, and extractions.  Preventive care is also provided. Treatment is provided to both adults and children. Patients are selected via a lottery and there is often a waiting list.   [facility] [address] [place]  [phone] [email or web address]   [facility] [address] [place], [place] [phone], Ext. [phone] Second and Fourth Thursday of each month, opens at 6:30 AM; Clinic ends at 9 AM.  Patients are seen on a first-come first-served basis, and a limited number are seen during each clinic.   [facility]  [address] Hillard Danker [place], [place] [phone]   Eligibility  Requirements You must have lived in [place], [place], or [place] counties for at least the last three months.   You cannot be eligible for state or federal sponsored [place]healthcare insurance, including [employer]IG[employer]eterans Administration, [place]icaid, or [employer]icare.   You generally cannot be eligible for healthcare insurance through your employer.    How to apply: Eligibility screenings are held every Tuesday and Wednesday afternoon from 1:00 pm until 4:00 pm. You do not need an appointment for the interview!  [facility] [address], [place], [place] [phone]   [facility]  [phone]   [facility]  [phone]   [facility]  [phone]    Behavioral Health Resources in the Community: Intensive Outpatient Programs Organization         Address  Phone  Notes  [facility] [address], [place], [place] [phone]   [facility] [address], [place], [place] [phone]   [facility] [address], [place], [place]  [phone]   [facility] [address],  [place], [place] [phone] or [phone]   Substance Abuse Resources Organization         Address  Phone  Notes  [facility]  [phone]   Addiction  [facility]  [phone]   [facility]  [phone]   [facility]  [phone]   Residential & Outpatient Substance Abuse Program  [phone]   Psychological Services Organization         Address  Phone  Notes  [facility]  [phone]- [phone]   [facility]  [phone]- [phone]   [facility] [address], [place] [phone] or [phone]    Mobile Crisis Teams Organization         Address  Phone  Notes  [facility], Mobile Crisis Care Unit  [phone]   [facility]  [address] [place], [place] [phone]   Doristine LocksSharon DeEsch [address] [place] [phone]    Self-Help/Support Groups Organization         Address  Phone             Notes  [facility] [place] - variety of support groups  336- [phone] Call for more information  Narcotics Anonymous (NA), [facility] [address], [place]  2 meetings at this location   Statisticianesidential Treatment Programs Organization         Address  Phone  Notes  [facility] [address],    [place]  [phone]   [facility]  [address], [place] [phone], [place], [place] [phone]   [facility] [address], [place] [phone] Admissions: 8am-3pm M-F  [facility] [address]    [place], [place] [phone]   [facility] [address], [place], [place] [phone]   [facility] [address]  [place], [place] [phone]   [facility] - Intensive Outpatient [address], [place], [place] [phone]   [facility] (Addiction [facility] [address]  [place], [place] [phone] or [phone]   [facility] ([facility]) [address] [place], [place] [phone] Accepts Medicaid  [facility] [address]  [place]  [phone] Substance Abuse/Addiction Treatment   [facility] Resources Organization  Address  Phone  Notes  °[facility]  ([phone]) [phone]   °Julie Brannon, PhD [address] [place], [place]   ([phone]) [phone] or (336) [phone]   °[facility]   [address] °[place], [place] ([phone]) [phone]   °[facility] [address], [place], [place] ([phone]) [phone] Insurance/Medicaid/sponsorship through [facility]  °[facility] [address]                                    [place], [place] ([phone]) [phone] Therapy/tele-psych/case  °[facility] [address]  ° [place], [place] ([phone]) [phone]    °Dr. Arfeen  ([phone]) [phone]   °[facility] [place]  [employer] [place] [facility] 1) [address], [place] °2) [address], [place] °3)  [address], [place] ([phone]) [phone] °(336) [phone] ° °(336) [phone]   °[place] Child Abuse Hotline (336) 342-1394 or (336) [phone] (After Hours)    ° ° ° °

## 2014-04-10 NOTE — ED Provider Notes (Signed)
Medical screening examination/treatment/procedure(s) were performed by non-physician practitioner and as supervising physician I was immediately available for consultation/collaboration.   EKG Interpretation None        Mirna Mires, MD 04/10/14 [phone]

## 2014-04-10 NOTE — ED Provider Notes (Signed)
CSN: [id number]     Arrival date & time 04/10/14  4270 History   First MD Initiated Contact with Patient 04/10/14 1009     Chief Complaint  Patient presents with  . Eye Problem    irritation     (Consider location/radiation/quality/duration/timing/severity/associated sxs/prior Treatment) Patient is a 28 y.o. female presenting with eye problem. The history is provided by the patient. No language interpreter was used.  Eye Problem Associated symptoms: discharge, itching and redness   Associated symptoms: no photophobia   Beth Gibson is a 28 y/o F with PMHx of chlamydia, trichomonas, headache, UTI, yeast infection, seasonal allergies presenting to the ED with left eye itching and tearing that started yesterday. Patient reported that the eye continues to itch. Reported that clear tears are coming from the eye. Reported that when she woke up this morning she had to pry her eye open in order to get the eyelids to separate. Reported that she has been having nasal congestion, stated that she has bad seasonal allergies and have been acting up since the pollen increased. Reported that she has been using Zyrtec and visine drops with minimal relief. Reported that this happens to her every year when the season changes. Associated symptoms of nasal congestion and scratchy throat. Denied trauma, apply pain, numbness, tingling, chest pain, shortness of breath, difficulty breathing, sudden loss of vision, foreign body sensation, sick contacts. Denied eye contacts-reported that she wears glasses only. Denied new makeup or face wash. PCP urgent care Center  Past Medical History  Diagnosis Date  . Chlamydia   . Trichomonas   . Preterm labor   . Headache(784.0)   . UTI (lower urinary tract infection)   . BV (bacterial vaginosis)   . Yeast vaginitis    Past Surgical History  Procedure Laterality Date  . Cyst removed from l buttock    . Cesarean section    . Wisdom tooth extraction    . Wisdom tooth  extraction  2009  . Cystectomy  2009    left buttock  . Cesarean section  04/18/2012    Procedure: CESAREAN SECTION;  Surgeon: Guss Bunde, MD;  Location: [facility];  Service: Gynecology;  Laterality: N/A;  repeat   Family History  Problem Relation Age of Onset  . Hypertension Mother    History  Substance Use Topics  . Smoking status: Never Smoker   . Smokeless tobacco: Never Used  . Alcohol Use: 0.5 oz/week    1 drink(s) per week     Comment: "Awhile back"   OB History   Grav Para Term Preterm Abortions TAB SAB Ect Mult Living   3 2 0 2 1 0 1 0 0 2      Review of Systems  Constitutional: Negative for fever and chills.  HENT: Positive for congestion, sinus pressure and sore throat. Negative for ear pain and facial swelling.   Eyes: Positive for discharge, redness, itching and visual disturbance. Negative for photophobia.  Respiratory: Negative for chest tightness and shortness of breath.   Cardiovascular: Negative for chest pain.  Musculoskeletal: Negative for neck pain.  All other systems reviewed and are negative.     Allergies  Vicodin  Home Medications   Prior to Admission medications   Medication Sig Start Date End Date Taking? Authorizing Provider  cetirizine (ZYRTEC) 10 MG tablet Take 10 mg by mouth daily.   Yes Historical Provider, MD   BP 127/74  Pulse 98  Temp(Src) 98.5 F (36.9 C) (Oral)  Resp  18  SpO2 100%  LMP 03/30/2014 Physical Exam  Nursing note and vitals reviewed. Constitutional: She is oriented to person, place, and time. She appears well-developed. No distress.  HENT:  Head: Normocephalic.  Mouth/Throat: No oropharyngeal exudate.  Negative erythema, inflammation, lesions, sores, petechiae, exudate identified to the posterior oropharynx and bilateral tonsils. Negative bilateral tonsillar adenopathy identified. Negative uvula swelling. Uvula midline with symmetrical elevation. Negative trismus.  Eyes: EOM and lids are normal. Pupils are  equal, round, and reactive to light. Lids are everted and swept, no foreign bodies found. Right eye exhibits no chemosis, no discharge, no exudate and no hordeolum. No foreign body present in the right eye. Left eye exhibits no chemosis, no discharge, no exudate and no hordeolum. No foreign body present in the left eye. Right conjunctiva is not injected. Right conjunctiva has no hemorrhage. Left conjunctiva is injected (Mild to the medial canthus of the left side). Left conjunctiva has no hemorrhage. Right eye exhibits normal extraocular motion and no nystagmus. Left eye exhibits normal extraocular motion and no nystagmus. Right pupil is round and reactive. Left pupil is round and reactive.  Fundoscopic exam:      The right eye shows no arteriolar narrowing, no AV nicking, no exudate, no hemorrhage and no papilledema.       The left eye shows no arteriolar narrowing, no AV nicking, no exudate, no hemorrhage and no papilledema.  Slit lamp exam:      The right eye shows no corneal abrasion, no corneal flare, no corneal ulcer, no foreign body, no hyphema and no hypopyon.       The left eye shows no corneal abrasion, no corneal flare, no corneal ulcer, no foreign body, no hyphema, no hypopyon, no fluorescein uptake and no anterior chamber bulge.  Negative swelling to the orbits bilaterally Mild erythema identified to the sclera of the medial aspect of the left thigh Active clear tears noted Negative cobblestoning Negative active drainage of discharge noted to the eye Negative Seidel sign bilaterally Negative pain with flashing of light in patient's eyes bilaterally   Neck: Normal range of motion. Neck supple. No tracheal deviation present.  Negative neck stiffness Negative nuchal rigidity Negative cervical lymphadenopathy  Negative meningeal signs  Cardiovascular: Normal rate, regular rhythm and normal heart sounds.  Exam reveals no friction rub.   No murmur heard. Pulses:      Radial pulses are 2+  on the right side, and 2+ on the left side.  Pulmonary/Chest: Effort normal and breath sounds normal. No respiratory distress. She has no wheezes. She has no rales.  Patient is able to speak in full sentences without difficulty  Negative use of accessory muscles Negative stridor  Musculoskeletal: Normal range of motion.  Full ROM to upper and lower extremities without difficulty noted, negative ataxia noted.  Lymphadenopathy:    She has no cervical adenopathy.  Neurological: She is alert and oriented to person, place, and time. No cranial nerve deficit. She exhibits normal muscle tone. Coordination normal.  Skin: Skin is warm and dry. No rash noted. She is not diaphoretic. No erythema.  Psychiatric: She has a normal mood and affect. Her behavior is normal. Thought content normal.    ED Course  Procedures (including critical care time) Labs Review Labs Reviewed - No data to display  Imaging Review No results found.   EKG Interpretation None      MDM   Final diagnoses:  Conjunctivitis  Environmental allergies    Medications  tetracaine (PONTOCAINE)  0.5 % ophthalmic solution 2 drop (2 drops Right Eye Given by Other 04/10/14 1054)  fluorescein ophthalmic strip 1 strip (1 strip Left Eye Given by Other 04/10/14 1055)   Filed Vitals:   04/10/14 0948  BP: 127/74  Pulse: 98  Temp: 98.5 F (36.9 C)  TempSrc: Oral  Resp: 18  SpO2: 100%   Patient presenting to the ED with left eye tearing, itching that started yesterday. Stated that the eyes been constantly tearing, mild redness to the sclera of the left eye with continuous itching. Reported that she has bad allergies and her allergies worsen with pollen increase. Stated that she's been using Visine drops and Zyrtec with minimal relief. Reported that she does not wear contacts, reports that she wears glasses. Denied trauma to the eye. Alert and oriented. GCS 15. Heart rate and rhythm normal. Lungs clear to auscultation. Radial  pulses 2+ bilaterally. Negative stridor. Patient is able to speak in full sentences without difficulty. Negative use of accessory muscles. Unremarkable oral exam with negative findings of lesions, sores, petechiae, exudate or swelling localized to the tonsils and posterior oropharynx. Negative erythema noted to the tonsils and posterior oropharynx. Uvula midline with symmetrical elevation-negative uvula swelling. Negative neck stiffness, supple. Negative cervical lymphadenopathy. Negative trismus. Mild injection identified to the sclera of the left eye, medial aspect. Negative consensual reactivity. EOMs intact with negative pain upon motion. Visual fields grossly intact. Negative nystagmus. Negative cobblestoning. Unremarkable funduscopic exam. Unremarkable silk lamp exam-negative corneal abrasions noted. Negative Seidel sign. Negative uptake. Doubt corneal abrasion. Doubt irisitis. Doubt pre-and post-septal cellulitis.  Doubt acute angle glaucoma. Doubt blepharitis. Doubt dacrocystitis. Suspicion to be conjunctivitis, viral-cannot rule out allergic conjunctivitis. Patient stable, afebrile. Patient not septic appearing. Patient hemodynamically stable and fine for discharge. Discharge patient with ophthalmic drops. Referred patient to primary care provider and ophthalmologist. Referred patient to allergist. Discussed with patient to rest and stay hydrated. Discussed with patient to go for contacts, eye makeup. Discussed with patient proper hygiene to prevent spreading. Discussed with patient to closely monitor symptoms and if symptoms are to worsen or change to report back to the ED - strict return instructions given.  Patient agreed to plan of care, understood, all questions answered.    Jamse Mead, PA-C 04/10/14 1217

## 2014-04-10 NOTE — ED Notes (Addendum)
Pt c/o  Left eye irritation x 2 days, denies injury. Pt eye is slightly red in outer corner, does have seasonal allergies. States it is blurry.

## 2014-06-26 ENCOUNTER — Emergency Department (HOSPITAL_COMMUNITY)
Admission: EM | Admit: 2014-06-26 | Discharge: 2014-06-26 | Disposition: A | Discharge status: Home / Self Care | Payer: Medicaid Other | Attending: Emergency Medicine | Admitting: Emergency Medicine

## 2014-06-26 ENCOUNTER — Encounter (HOSPITAL_COMMUNITY): Payer: Self-pay | Admitting: Emergency Medicine

## 2014-06-26 DIAGNOSIS — Z79899 Other long term (current) drug therapy: Secondary | ICD-10-CM | POA: Diagnosis not present

## 2014-06-26 DIAGNOSIS — N760 Acute vaginitis: Secondary | ICD-10-CM | POA: Insufficient documentation

## 2014-06-26 DIAGNOSIS — Z3202 Encounter for pregnancy test, result negative: Secondary | ICD-10-CM | POA: Insufficient documentation

## 2014-06-26 DIAGNOSIS — Z87440 Personal history of urinary (tract) infections: Secondary | ICD-10-CM | POA: Diagnosis not present

## 2014-06-26 DIAGNOSIS — B373 Candidiasis of vulva and vagina: Secondary | ICD-10-CM

## 2014-06-26 DIAGNOSIS — A499 Bacterial infection, unspecified: Secondary | ICD-10-CM | POA: Insufficient documentation

## 2014-06-26 DIAGNOSIS — B3731 Acute candidiasis of vulva and vagina: Secondary | ICD-10-CM | POA: Insufficient documentation

## 2014-06-26 DIAGNOSIS — B9689 Other specified bacterial agents as the cause of diseases classified elsewhere: Secondary | ICD-10-CM | POA: Diagnosis not present

## 2014-06-26 DIAGNOSIS — Z8751 Personal history of pre-term labor: Secondary | ICD-10-CM | POA: Diagnosis not present

## 2014-06-26 DIAGNOSIS — N898 Other specified noninflammatory disorders of vagina: Secondary | ICD-10-CM | POA: Diagnosis present

## 2014-06-26 DIAGNOSIS — M79609 Pain in unspecified limb: Secondary | ICD-10-CM | POA: Insufficient documentation

## 2014-06-26 LAB — WET PREP, GENITAL: Trich, Wet Prep: NONE SEEN

## 2014-06-26 LAB — URINALYSIS, ROUTINE W REFLEX MICROSCOPIC
Bilirubin Urine: NEGATIVE
Glucose, UA: NEGATIVE mg/dL
Hgb urine dipstick: NEGATIVE
Ketones, ur: NEGATIVE mg/dL
NITRITE: NEGATIVE
PH: 6.5 (ref 5.0–8.0)
Protein, ur: NEGATIVE mg/dL
SPECIFIC GRAVITY, URINE: 1.013 (ref 1.005–1.030)
Urobilinogen, UA: 0.2 mg/dL (ref 0.0–1.0)

## 2014-06-26 LAB — HIV ANTIBODY (ROUTINE TESTING W REFLEX): HIV: NONREACTIVE

## 2014-06-26 LAB — URINE MICROSCOPIC-ADD ON

## 2014-06-26 LAB — POC URINE PREG, ED: PREG TEST UR: NEGATIVE

## 2014-06-26 LAB — RPR

## 2014-06-26 MED ORDER — FLUCONAZOLE 150 MG PO TABS: 150.0000 mg | ORAL_TABLET | Freq: Once | ORAL | Status: DC

## 2014-06-26 MED ORDER — METRONIDAZOLE 500 MG PO TABS: 500.0000 mg | ORAL_TABLET | Freq: Two times a day (BID) | ORAL | Status: DC

## 2014-06-26 NOTE — Discharge Instructions (Signed)
Bacterial Vaginosis Bacterial vaginosis is a vaginal infection that occurs when the normal balance of bacteria in the vagina is disrupted. It results from an overgrowth of certain bacteria. This is the most common vaginal infection in women of childbearing age. Treatment is important to prevent complications, especially in pregnant women, as it can cause a premature delivery. CAUSES  Bacterial vaginosis is caused by an increase in harmful bacteria that are normally present in smaller amounts in the vagina. Several different kinds of bacteria can cause bacterial vaginosis. However, the reason that the condition develops is not fully understood. RISK FACTORS Certain activities or behaviors can put you at an increased risk of developing bacterial vaginosis, including:  Having a new sex partner or multiple sex partners.  Douching.  Using an intrauterine device (IUD) for contraception. Women do not get bacterial vaginosis from toilet seats, bedding, swimming pools, or contact with objects around them. SIGNS AND SYMPTOMS  Some women with bacterial vaginosis have no signs or symptoms. Common symptoms include:  Grey vaginal discharge.  A fishlike odor with discharge, especially after sexual intercourse.  Itching or burning of the vagina and vulva.  Burning or pain with urination. DIAGNOSIS  Your health care provider will take a medical history and examine the vagina for signs of bacterial vaginosis. A sample of vaginal fluid may be taken. Your health care provider will look at this sample under a microscope to check for bacteria and abnormal cells. A vaginal pH test may also be done.  TREATMENT  Bacterial vaginosis may be treated with antibiotic medicines. These may be given in the form of a pill or a vaginal cream. A second round of antibiotics may be prescribed if the condition comes back after treatment.  HOME CARE INSTRUCTIONS   Only take over-the-counter or prescription medicines as  directed by your health care provider.  If antibiotic medicine was prescribed, take it as directed. Make sure you finish it even if you start to feel better.  Do not have sex until treatment is completed.  Tell all sexual partners that you have a vaginal infection. They should see their health care provider and be treated if they have problems, such as a mild rash or itching.  Practice safe sex by using condoms and only having one sex partner. SEEK MEDICAL CARE IF:   Your symptoms are not improving after 3 days of treatment.  You have increased discharge or pain.  You have a fever. MAKE SURE YOU:   Understand these instructions.  Will watch your condition.  Will get help right away if you are not doing well or get worse. FOR MORE INFORMATION  Centers for Disease Control and Prevention, Division of STD Prevention: Appraiser[employer]raud.fi American Sexual Health Association (ASHA): www.ashastd.org  Document Released: 12/07/2005 Document Revised: 09/27/2013 Document Reviewed: 07/19/2013 [facility] Patient Information 2015 [place], [place]. This information is not intended to replace advice given to you by your health care provider. Make sure you discuss any questions you have with your health care provider.  Candidal Vulvovaginitis Candidal vulvovaginitis is an infection of the vagina and vulva. The vulva is the skin around the opening of the vagina. This may cause itching and discomfort in and around the vagina.  HOME CARE  Only take medicine as told by your doctor.  Do not have sex (intercourse) until the infection is healed or as told by your doctor.  Practice safe sex.  Tell your sex partner about your infection.  Do not douche or use tampons.  Wear  cotton underwear. Do not wear tight pants or panty hose.  Eat yogurt. This may help treat and prevent yeast infections. GET HELP RIGHT AWAY IF:   You have a fever.  Your problems get worse during treatment or do not get better in 3  days.  You have discomfort, irritation, or itching in your vagina or vulva area.  You have pain after sex.  You start to get belly (abdominal) pain. MAKE SURE YOU:  Understand these instructions.  Will watch your condition.  Will get help right away if you are not doing well or get worse. Document Released: 03/05/2009 Document Revised: 12/12/2013 Document Reviewed: 03/05/2009 [facility] Patient Information 2015 [place], [place]. This information is not intended to replace advice given to you by your health care provider. Make sure you discuss any questions you have with your health care provider.     [email or web address] [place] # [place], [place] [phone]    [email or web address] [address] [place], [place] ([place]) [place] # [place], [place] [phone]   [facility] [email or web address] [address] [place], [place] [phone]   [facility] [email or web address] [address] [place], [place] [phone]   [facility] [email or web address] [phone]

## 2014-06-26 NOTE — ED Notes (Signed)
Per pt, states she has been having vaginal discharge and itching for a week-also having right leg pain, no injury

## 2014-06-26 NOTE — ED Provider Notes (Signed)
TIME SEEN: 11:42 AM  CHIEF COMPLAINT: Vaginal discharge, right lower extremity pain  HPI: Patient is a 18 from female with history of prior Chlamydia and Trichomonas who presents to the emergency department with complaints of one week of white vaginal discharge, irritation and burning. She is sexually active with one partner and they do not use protection. She is a G3 P2 A1. Last period was June 27. She denies any dysuria or hematuria. No fevers, chills, nausea, vomiting or diarrhea. No abdominal pain or back pain.  Patient is also complaining of pain in her right shin has been present for 3 days. She states she did sprain her ankle several weeks ago and thinks this may be contributed to her pain. No new injury. No numbness or focal weakness. No swelling, redness or warmth. No fever. No joint swelling.  ROS: See HPI Constitutional: no fever  Eyes: no drainage  ENT: no runny nose   Cardiovascular:  no chest pain  Resp: no SOB  GI: no vomiting GU: no dysuria Integumentary: no rash  Allergy: no hives  Musculoskeletal: no leg swelling  Neurological: no slurred speech ROS otherwise negative  PAST MEDICAL HISTORY/PAST SURGICAL HISTORY:  Past Medical History  Diagnosis Date  . Chlamydia   . Trichomonas   . Preterm labor   . Headache(784.0)   . UTI (lower urinary tract infection)   . BV (bacterial vaginosis)   . Yeast vaginitis     MEDICATIONS:  Prior to Admission medications   Medication Sig Start Date End Date Taking? Authorizing Provider  cetirizine (ZYRTEC) 10 MG tablet Take 10 mg by mouth daily.    Historical Provider, MD  trimethoprim-polymyxin b (POLYTRIM) ophthalmic solution Place 1 drop into the left eye every 6 (six) hours. 04/10/14   Marissa Sciacca, PA-C    ALLERGIES:  Allergies  Allergen Reactions  . Vicodin [Hydrocodone-Acetaminophen] Itching    SOCIAL HISTORY:  History  Substance Use Topics  . Smoking status: Never Smoker   . Smokeless tobacco: Never Used  .  Alcohol Use: 0.5 oz/week    1 drink(s) per week     Comment: "Awhile back"    FAMILY HISTORY: Family History  Problem Relation Age of Onset  . Hypertension Mother     EXAM: BP 132/72  Pulse 84  Temp(Src) 98.2 F (36.8 C) (Oral)  Resp 16  SpO2 100%  LMP 06/16/2014 CONSTITUTIONAL: Alert and oriented and responds appropriately to questions. Well-appearing; well-nourished HEAD: Normocephalic EYES: Conjunctivae clear, PERRL ENT: normal nose; no rhinorrhea; moist mucous membranes; pharynx without lesions noted NECK: Supple, no meningismus, no LAD  CARD: RRR; S1 and S2 appreciated; no murmurs, no clicks, no rubs, no gallops RESP: Normal chest excursion without splinting or tachypnea; breath sounds clear and equal bilaterally; no wheezes, no rhonchi, no rales,  ABD/GI: Normal bowel sounds; non-distended; soft, non-tender, no rebound, no guarding GU:  Normal external genitalia, patient has copious amounts of white thick vaginal discharge without strong odor detected, no vaginal bleeding, no adnexal tenderness or cervical motion tenderness, no adnexal fullness BACK:  The back appears normal and is non-tender to palpation, there is no CVA tenderness EXT: Patient has no tenderness to palpation over her brother extremity there is no associated swelling, no erythema or warmth, no joint effusions, 2+ DP pulses bilaterally, sensation to light touch intact diffusely, Normal ROM in all joints;  no edema; normal capillary refill; no cyanosis; compartments soft   SKIN: Normal color for age and race; warm NEURO: Moves all extremities  equally PSYCH: The patient's mood and manner are appropriate. Grooming and personal hygiene are appropriate.  MEDICAL DECISION MAKING: Patient here with vaginal discharge and right lower extremity pain. Her urine shows small leukocytes but also many bacteria and many squamous cells. Suspect dirty catch.  Her wet prep shows too numerous to count yeast and clue cells. We'll  treat with Flagyl and Diflucan. She is requesting STD testing today which is all pending but given her benign exam I do not feel she needs treatment at this time. As for her right lower extremity pain, discussed with patient that this may be compensation from her recent ankle sprain I am not concerned for life-threatening illness. There is no sign of compartment syndrome, septic arthritis, cellulitis, DVT, peripheral arterial disease. Have discussed with her rest, elevation and ice. Patient verbalizes understanding is comfortable plan. We'll have her use ibuprofen for pain.       [place], DO 06/26/14 1705

## 2014-08-06 ENCOUNTER — Encounter (HOSPITAL_COMMUNITY): Payer: Self-pay | Admitting: Emergency Medicine

## 2014-08-06 ENCOUNTER — Emergency Department (HOSPITAL_COMMUNITY)
Admission: EM | Admit: 2014-08-06 | Discharge: 2014-08-06 | Disposition: A | Discharge status: Home / Self Care | Payer: Medicaid Other | Attending: Emergency Medicine | Admitting: Emergency Medicine

## 2014-08-06 DIAGNOSIS — Y9389 Activity, other specified: Secondary | ICD-10-CM | POA: Insufficient documentation

## 2014-08-06 DIAGNOSIS — Z87440 Personal history of urinary (tract) infections: Secondary | ICD-10-CM | POA: Diagnosis not present

## 2014-08-06 DIAGNOSIS — Z8742 Personal history of other diseases of the female genital tract: Secondary | ICD-10-CM | POA: Insufficient documentation

## 2014-08-06 DIAGNOSIS — S4980XA Other specified injuries of shoulder and upper arm, unspecified arm, initial encounter: Secondary | ICD-10-CM | POA: Diagnosis not present

## 2014-08-06 DIAGNOSIS — Z8619 Personal history of other infectious and parasitic diseases: Secondary | ICD-10-CM | POA: Diagnosis not present

## 2014-08-06 DIAGNOSIS — M791 Myalgia, unspecified site: Secondary | ICD-10-CM

## 2014-08-06 DIAGNOSIS — S46909A Unspecified injury of unspecified muscle, fascia and tendon at shoulder and upper arm level, unspecified arm, initial encounter: Secondary | ICD-10-CM | POA: Diagnosis not present

## 2014-08-06 DIAGNOSIS — IMO0002 Reserved for concepts with insufficient information to code with codable children: Secondary | ICD-10-CM | POA: Diagnosis present

## 2014-08-06 DIAGNOSIS — S199XXA Unspecified injury of neck, initial encounter: Secondary | ICD-10-CM | POA: Diagnosis not present

## 2014-08-06 DIAGNOSIS — IMO0001 Reserved for inherently not codable concepts without codable children: Secondary | ICD-10-CM | POA: Insufficient documentation

## 2014-08-06 DIAGNOSIS — Z792 Long term (current) use of antibiotics: Secondary | ICD-10-CM | POA: Insufficient documentation

## 2014-08-06 DIAGNOSIS — S0993XA Unspecified injury of face, initial encounter: Secondary | ICD-10-CM | POA: Insufficient documentation

## 2014-08-06 DIAGNOSIS — Y92410 Unspecified street and highway as the place of occurrence of the external cause: Secondary | ICD-10-CM | POA: Diagnosis not present

## 2014-08-06 MED ORDER — HYDROCODONE-ACETAMINOPHEN 5-325 MG PO TABS: 1.0000 | ORAL_TABLET | ORAL | Status: DC | PRN

## 2014-08-06 MED ORDER — IBUPROFEN 400 MG PO TABS
600.0000 mg | ORAL_TABLET | Freq: Once | ORAL | Status: AC
  Administered 2014-08-06: 600 mg via ORAL
  Filled 2014-08-06 (×2): qty 1
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
## 2014-08-11 ENCOUNTER — Encounter (HOSPITAL_COMMUNITY): Payer: Self-pay | Admitting: Emergency Medicine

## 2014-08-11 ENCOUNTER — Emergency Department (INDEPENDENT_AMBULATORY_CARE_PROVIDER_SITE_OTHER)
Admission: EM | Admit: 2014-08-11 | Discharge: 2014-08-11 | Disposition: A | Discharge status: Home / Self Care | Payer: Medicaid Other | Source: Home / Self Care | Attending: Family Medicine | Admitting: Family Medicine

## 2014-08-11 ENCOUNTER — Other Ambulatory Visit (HOSPITAL_COMMUNITY)
Admission: RE | Admit: 2014-08-11 | Discharge: 2014-08-11 | Disposition: A | Discharge status: Home / Self Care | Payer: Medicaid Other | Source: Ambulatory Visit | Attending: Family Medicine | Admitting: Family Medicine

## 2014-08-11 DIAGNOSIS — N760 Acute vaginitis: Secondary | ICD-10-CM | POA: Insufficient documentation

## 2014-08-11 DIAGNOSIS — Z113 Encounter for screening for infections with a predominantly sexual mode of transmission: Secondary | ICD-10-CM | POA: Diagnosis present

## 2014-08-11 LAB — POCT URINALYSIS DIP (DEVICE)
Bilirubin Urine: NEGATIVE
GLUCOSE, UA: NEGATIVE mg/dL
Hgb urine dipstick: NEGATIVE
Leukocytes, UA: NEGATIVE
Nitrite: NEGATIVE
Protein, ur: NEGATIVE mg/dL
Specific Gravity, Urine: >=1.03 (ref 1.005–1.030)
UROBILINOGEN UA: 0.2 mg/dL (ref 0.0–1.0)
pH: 5.5 (ref 5.0–8.0)

## 2014-08-11 LAB — POCT PREGNANCY, URINE: PREG TEST UR: NEGATIVE

## 2014-08-11 MED ORDER — METRONIDAZOLE 500 MG PO TABS: 500.0000 mg | ORAL_TABLET | Freq: Two times a day (BID) | ORAL | Status: DC

## 2014-08-11 MED ORDER — FLUCONAZOLE 150 MG PO TABS: 150.0000 mg | ORAL_TABLET | Freq: Once | ORAL | Status: DC

## 2014-08-11 MED ORDER — TERCONAZOLE 80 MG VA SUPP: 80.0000 mg | Freq: Every day | VAGINAL | Status: DC

## 2014-08-11 NOTE — ED Provider Notes (Signed)
CSN: [id number]     Arrival date & time 08/11/14  1229 History   First MD Initiated Contact with Patient 08/11/14 1246     Chief Complaint  Patient presents with  . Urinary Tract Infection   (Consider location/radiation/quality/duration/timing/severity/associated sxs/prior Treatment) Patient is a 28 y.o. female presenting with vaginal discharge. The history is provided by the patient.  Vaginal Discharge Quality:  Malodorous and white Severity:  Moderate Onset quality:  Gradual Duration:  1 month Progression:  Worsening Chronicity:  New Ineffective treatments: treated for bv at [facility] 44mo ago. Associated symptoms: dysuria and vaginal itching   Associated symptoms: no abdominal pain, no fever, no genital lesions and no urinary hesitancy     Past Medical History  Diagnosis Date  . Chlamydia   . Trichomonas   . Preterm labor   . Headache(784.0)   . UTI (lower urinary tract infection)   . BV (bacterial vaginosis)   . Yeast vaginitis    Past Surgical History  Procedure Laterality Date  . Cyst removed from l buttock    . Cesarean section    . Wisdom tooth extraction    . Wisdom tooth extraction  2009  . Cystectomy  2009    left buttock  . Cesarean section  04/18/2012    Procedure: CESAREAN SECTION;  Surgeon: Guss Bunde, MD;  Location: [facility];  Service: Gynecology;  Laterality: N/A;  repeat   Family History  Problem Relation Age of Onset  . Hypertension Mother    History  Substance Use Topics  . Smoking status: Never Smoker   . Smokeless tobacco: Never Used  . Alcohol Use: 0.5 oz/week    1 drink(s) per week     Comment: "Awhile back"   OB History   Grav Para Term Preterm Abortions TAB SAB Ect Mult Living   3 2 0 2 1 0 1 0 0 2      Review of Systems  Constitutional: Negative.  Negative for fever.  Gastrointestinal: Negative for abdominal pain.  Genitourinary: Positive for dysuria and vaginal discharge. Negative for hesitancy, vaginal bleeding, menstrual problem  and pelvic pain.    Allergies  Vicodin  Home Medications   Prior to Admission medications   Medication Sig Start Date End Date Taking? Authorizing Provider  acetaminophen (TYLENOL) 500 MG tablet Take 1,000 mg by mouth every 6 (six) hours as needed for moderate pain.    Historical Provider, MD  fluconazole (DIFLUCAN) 150 MG tablet Take 1 tablet (150 mg total) by mouth once. 06/26/14   Kristen N Ward, DO  fluconazole (DIFLUCAN) 150 MG tablet Take 1 tablet (150 mg total) by mouth once. Repeat in 1 week. 08/11/14   Billy Fischer, MD  HYDROcodone-acetaminophen (NORCO/VICODIN) 5-325 MG per tablet Take 1-2 tablets by mouth every 4 (four) hours as needed. 08/06/14   [name], MD  ibuprofen (ADVIL,MOTRIN) 800 MG tablet Take 1,800 mg by mouth every 8 (eight) hours as needed for moderate pain.    Historical Provider, MD  metroNIDAZOLE (FLAGYL) 500 MG tablet Take 1 tablet (500 mg total) by mouth 2 (two) times daily. 06/26/14   Kristen N Ward, DO  metroNIDAZOLE (FLAGYL) 500 MG tablet Take 1 tablet (500 mg total) by mouth 2 (two) times daily. 08/11/14   Billy Fischer, MD  naproxen sodium (ANAPROX) 220 MG tablet Take 220 mg by mouth 2 (two) times daily as needed (pain).    Historical Provider, MD  terconazole (TERAZOL 3) 80 MG vaginal suppository Place 1  suppository (80 mg total) vaginally at bedtime. 08/11/14   Billy Fischer, MD   BP 122/67  Pulse 86  Temp(Src) 98.3 F (36.8 C) (Oral)  Resp 12  SpO2 100%  LMP 07/11/2014 Physical Exam  Nursing note and vitals reviewed. Constitutional: She is oriented to person, place, and time. She appears well-developed and well-nourished. No distress.  Abdominal: Soft. Bowel sounds are normal. There is no tenderness.  Genitourinary: Uterus normal. There is no rash or tenderness on the right labia. There is no rash or tenderness on the left labia. Uterus is not tender. Cervix exhibits no motion tenderness and no discharge. Right adnexum displays no mass and no  tenderness. Left adnexum displays no mass and no tenderness. There is erythema around the vagina. No tenderness around the vagina. No foreign body around the vagina. No signs of injury around the vagina. Vaginal discharge found.  Neurological: She is alert and oriented to person, place, and time.  Skin: Skin is warm and dry.    ED Course  Procedures (including critical care time) Labs Review Labs Reviewed  POCT URINALYSIS DIP (DEVICE) - Abnormal; Notable for the following:    Ketones, ur TRACE (*)    All other components within normal limits  POCT PREGNANCY, URINE  CERVICOVAGINAL ANCILLARY ONLY    Imaging Review No results found.   MDM   1. Vaginitis and vulvovaginitis        Billy Fischer, MD 08/11/14 1329

## 2014-08-11 NOTE — Discharge Instructions (Signed)
We will call with positive test results and treat as indicated. Eat yogurt daily. Take probiotic daily.

## 2014-08-11 NOTE — ED Notes (Signed)
Burning and itching of vaginal area, reports discharge with odor.  Patient treated last month for bv-reports discharge never changed.

## 2014-08-16 ENCOUNTER — Telehealth (HOSPITAL_COMMUNITY): Payer: Self-pay | Admitting: Emergency Medicine

## 2014-08-16 MED ORDER — AZITHROMYCIN 500 MG PO TABS: 1000.0000 mg | ORAL_TABLET | Freq: Once | ORAL | Status: DC

## 2014-08-16 NOTE — ED Notes (Signed)
DNA probe was positive for Chlamydia. We'll need azithromycin 500 mg, #2, take 2 at one time and we will need to inform patient, have her notify her partners, and report to the health department.  Harden Mo, MD 08/16/14 [phone]

## 2014-08-16 NOTE — ED Notes (Signed)
GC neg., Chlamydia pos., Affirm: Candida pos., Trich and Gardnerella neg.  Pt. adequately treated with Diflucan for yeast.  Message sent to Dr. Jake Michaelis. Roselyn Meier 08/16/2014

## 2014-08-17 ENCOUNTER — Telehealth (HOSPITAL_COMMUNITY): Payer: Self-pay | Admitting: *Deleted

## 2014-08-17 NOTE — ED Notes (Signed)
Dr. Jake Michaelis e-prescribed Zithromax to pt.'s pharmacy on 8/27.  I called pt. and left a message to call.  Call 1. 08/17/2014 Pt. called back. Pt. verified x 2 and given results.  Pt. told she was adequately treated with Diflucan for the yeast infection, (states she has taken it) and needs Zithromax for the Chlamydia.  Pt. told where to pick up her Rx.  Pt. instructed to notify her partner, no sex for 1 week and to practice safe sex. Pt. told she can get HIV testing at the [facility]. STD clinic, by appointment.  Pt. voiced understanding. DHHS form completed and faxed to the [facility] Department. Roselyn Meier 08/17/2014

## 2014-09-05 ENCOUNTER — Emergency Department: Payer: Self-pay | Admitting: Emergency Medicine

## 2014-09-06 LAB — CBC WITH DIFFERENTIAL/PLATELET
Basophil #: 0 10*3/uL (ref 0.0–0.1)
Basophil %: 0.7 %
EOS PCT: 1.7 %
Eosinophil #: 0.1 10*3/uL (ref 0.0–0.7)
HCT: 27.5 % — ABNORMAL LOW (ref 35.0–47.0)
HGB: 8.5 g/dL — AB (ref 12.0–16.0)
LYMPHS PCT: 11 %
Lymphocyte #: 0.6 10*3/uL — ABNORMAL LOW (ref 1.0–3.6)
MCH: 18 pg — ABNORMAL LOW (ref 26.0–34.0)
MCHC: 30.7 g/dL — ABNORMAL LOW (ref 32.0–36.0)
MCV: 59 fL — AB (ref 80–100)
Monocyte #: 0.4 x10 3/mm (ref 0.2–0.9)
Monocyte %: 7.9 %
Neutrophil #: 4 10*3/uL (ref 1.4–6.5)
Neutrophil %: 78.7 %
Platelet: 136 10*3/uL — ABNORMAL LOW (ref 150–440)
RBC: 4.7 10*6/uL (ref 3.80–5.20)
RDW: 20.3 % — AB (ref 11.5–14.5)
WBC: 5.1 10*3/uL (ref 3.6–11.0)

## 2014-09-06 LAB — COMPREHENSIVE METABOLIC PANEL
ALBUMIN: 3.7 g/dL (ref 3.4–5.0)
ANION GAP: 6 — AB (ref 7–16)
Alkaline Phosphatase: 43 U/L — ABNORMAL LOW
BUN: 8 mg/dL (ref 7–18)
Bilirubin,Total: 0.4 mg/dL (ref 0.2–1.0)
CALCIUM: 8.5 mg/dL (ref 8.5–10.1)
Chloride: 103 mmol/L (ref 98–107)
Co2: 26 mmol/L (ref 21–32)
Creatinine: 0.64 mg/dL (ref 0.60–1.30)
EGFR (African American): >60
EGFR (Non-African Amer.): >60
GLUCOSE: 100 mg/dL — AB (ref 65–99)
Osmolality: 269 (ref 275–301)
POTASSIUM: 3.7 mmol/L (ref 3.5–5.1)
SGOT(AST): 13 U/L — ABNORMAL LOW (ref 15–37)
SGPT (ALT): 12 U/L — ABNORMAL LOW
SODIUM: 135 mmol/L — AB (ref 136–145)
Total Protein: 8.4 g/dL — ABNORMAL HIGH (ref 6.4–8.2)

## 2014-09-06 LAB — LIPASE, BLOOD: Lipase: 147 U/L (ref 73–393)

## 2014-09-06 LAB — URINALYSIS, COMPLETE
Bilirubin,UR: NEGATIVE
Blood: NEGATIVE
Glucose,UR: NEGATIVE mg/dL (ref 0–75)
Ketone: NEGATIVE
Leukocyte Esterase: NEGATIVE
NITRITE: NEGATIVE
Ph: 7 (ref 4.5–8.0)
Protein: NEGATIVE
RBC,UR: 1 /HPF (ref 0–5)
Specific Gravity: 1.023 (ref 1.003–1.030)
WBC UR: 2 /HPF (ref 0–5)

## 2014-09-06 LAB — TROPONIN I

## 2014-10-22 ENCOUNTER — Encounter (HOSPITAL_COMMUNITY): Payer: Self-pay | Admitting: Emergency Medicine

## 2014-10-25 ENCOUNTER — Emergency Department (HOSPITAL_COMMUNITY)
Admission: EM | Admit: 2014-10-25 | Discharge: 2014-10-25 | Discharge status: Against Medical Advice | Payer: Medicaid Other | Attending: Emergency Medicine | Admitting: Emergency Medicine

## 2014-10-25 ENCOUNTER — Encounter (HOSPITAL_COMMUNITY): Payer: Self-pay | Admitting: *Deleted

## 2014-10-25 DIAGNOSIS — R079 Chest pain, unspecified: Secondary | ICD-10-CM | POA: Diagnosis not present

## 2014-10-25 DIAGNOSIS — M79605 Pain in left leg: Secondary | ICD-10-CM | POA: Insufficient documentation

## 2014-10-25 NOTE — ED Notes (Addendum)
Pt in c/o left leg pain, states it is her whole leg, denies back pain, states the leg with go out on her and she will fall, pain is constant, symptoms have been intermittent for the last several months, pt has been seen for this before and states she needs to see a specialist- pt also reports chest pain and left arm numbness that occurred this morning also

## 2014-10-30 ENCOUNTER — Encounter (HOSPITAL_COMMUNITY): Payer: Self-pay | Admitting: Emergency Medicine

## 2014-10-30 ENCOUNTER — Emergency Department (HOSPITAL_COMMUNITY)
Admission: EM | Admit: 2014-10-30 | Discharge: 2014-10-30 | Disposition: A | Discharge status: Home / Self Care | Payer: Medicaid Other | Attending: Emergency Medicine | Admitting: Emergency Medicine

## 2014-10-30 DIAGNOSIS — Z87440 Personal history of urinary (tract) infections: Secondary | ICD-10-CM | POA: Diagnosis not present

## 2014-10-30 DIAGNOSIS — Z792 Long term (current) use of antibiotics: Secondary | ICD-10-CM | POA: Diagnosis not present

## 2014-10-30 DIAGNOSIS — L02411 Cutaneous abscess of right axilla: Secondary | ICD-10-CM | POA: Insufficient documentation

## 2014-10-30 DIAGNOSIS — R51 Headache: Secondary | ICD-10-CM | POA: Diagnosis not present

## 2014-10-30 DIAGNOSIS — L723 Sebaceous cyst: Secondary | ICD-10-CM

## 2014-10-30 DIAGNOSIS — Z8742 Personal history of other diseases of the female genital tract: Secondary | ICD-10-CM | POA: Insufficient documentation

## 2014-10-30 DIAGNOSIS — Z8619 Personal history of other infectious and parasitic diseases: Secondary | ICD-10-CM | POA: Diagnosis not present

## 2014-10-30 HISTORY — DX: Cutaneous abscess of right axilla: L02.411

## 2014-10-30 MED ORDER — KETOROLAC TROMETHAMINE 10 MG PO TABS
10.0000 mg | ORAL_TABLET | Freq: Once | ORAL | Status: AC
  Administered 2014-10-30: 10 mg via ORAL
  Filled 2014-10-30: qty 1

## 2014-10-30 MED ORDER — LIDOCAINE HCL (PF) 1 % IJ SOLN
5.0000 mL | Freq: Once | INTRAMUSCULAR | Status: AC
  Administered 2014-10-30: 5 mL via INTRADERMAL
  Filled 2014-10-30: qty 5

## 2014-10-30 MED ORDER — OXYCODONE-ACETAMINOPHEN 10-325 MG PO TABS: 1.0000 | ORAL_TABLET | ORAL | Status: DC | PRN

## 2014-10-30 MED ORDER — CLINDAMYCIN HCL 300 MG PO CAPS
600.0000 mg | ORAL_CAPSULE | Freq: Once | ORAL | Status: AC
  Administered 2014-10-30: 600 mg via ORAL
  Filled 2014-10-30: qty 2

## 2014-10-30 MED ORDER — CLINDAMYCIN HCL 150 MG PO CAPS: 450.0000 mg | ORAL_CAPSULE | Freq: Three times a day (TID) | ORAL | Status: DC

## 2014-10-30 NOTE — ED Provider Notes (Signed)
CSN: [id number]     Arrival date & time 10/30/14  0908 History   First MD Initiated Contact with Patient 10/30/14 [phone]     Chief Complaint  Patient presents with  . Abscess     (Consider location/radiation/quality/duration/timing/severity/associated sxs/prior Treatment) The history is provided by the patient and medical records. No language interpreter was used.     Beth Gibson is a 28 y.o. female  with a hx of recurrent axilla abscess presents to the Emergency Department complaining of gradual, persistent, progressively worsening right axilla abscess onset 3 days ago.  Pt reports taking tylenol without relief, but has not tried any other treatments.  Pt reports she had an abscess in the same place > 6 mos ago and she believes that it never went away; however her symptoms did resolve after the I&D.  Associated symptoms include pain with movement of the arm.  Pt reports that she gets these when she shaves or changes deodorants and she admits to shaving 1 week ago.  Pt denies fever, chills, headache, neck pain, chest pain, SOB, abd pain, N/V/D.  Pt denies hx of diabetes, HIV or steroid usage.       Past Medical History  Diagnosis Date  . Chlamydia   . Trichomonas   . Preterm labor   . Headache(784.0)   . UTI (lower urinary tract infection)   . BV (bacterial vaginosis)   . Yeast vaginitis   . Abscess of axilla, right    Past Surgical History  Procedure Laterality Date  . Cyst removed from l buttock    . Cesarean section    . Wisdom tooth extraction    . Wisdom tooth extraction  2009  . Cystectomy  2009    left buttock  . Cesarean section  04/18/2012    Procedure: CESAREAN SECTION;  Surgeon: Guss Bunde, MD;  Location: [facility];  Service: Gynecology;  Laterality: N/A;  repeat   Family History  Problem Relation Age of Onset  . Hypertension Mother    History  Substance Use Topics  . Smoking status: Never Smoker   . Smokeless tobacco: Never Used  . Alcohol Use: 0.5 oz/week     1 Not specified per week     Comment: "special occasions"   OB History    Gravida Para Term Preterm AB TAB SAB Ectopic Multiple Living   3 2 0 2 1 0 1 0 0 2      Review of Systems  Constitutional: Negative for fever and chills.  Gastrointestinal: Negative for nausea and vomiting.  Skin: Positive for rash.  Allergic/Immunologic: Negative for immunocompromised state.  Hematological: Does not bruise/bleed easily.  Psychiatric/Behavioral: The patient is not nervous/anxious.       Allergies  Vicodin  Home Medications   Prior to Admission medications   Medication Sig Start Date End Date Taking? Authorizing Provider  acetaminophen (TYLENOL) 500 MG tablet Take 1,000 mg by mouth every 6 (six) hours as needed for moderate pain.   Yes Historical Provider, MD  azithromycin (ZITHROMAX) 500 MG tablet Take 2 tablets (1,000 mg total) by mouth once. Patient not taking: Reported on 10/30/2014 08/16/14   Harden Mo, MD  clindamycin (CLEOCIN) 150 MG capsule Take 3 capsules (450 mg total) by mouth 3 (three) times daily. 10/30/14   Takoya Jonas, PA-C  fluconazole (DIFLUCAN) 150 MG tablet Take 1 tablet (150 mg total) by mouth once. Patient not taking: Reported on 10/30/2014 06/26/14   Delice Bison Ward, DO  fluconazole (DIFLUCAN)  150 MG tablet Take 1 tablet (150 mg total) by mouth once. Repeat in 1 week. Patient not taking: Reported on 10/30/2014 08/11/14   Billy Fischer, MD  HYDROcodone-acetaminophen (NORCO/VICODIN) 5-325 MG per tablet Take 1-2 tablets by mouth every 4 (four) hours as needed. Patient not taking: Reported on 10/30/2014 08/06/14   [name], MD  ibuprofen (ADVIL,MOTRIN) 800 MG tablet Take 1,800 mg by mouth every 8 (eight) hours as needed for moderate pain.    Historical Provider, MD  metroNIDAZOLE (FLAGYL) 500 MG tablet Take 1 tablet (500 mg total) by mouth 2 (two) times daily. Patient not taking: Reported on 10/30/2014 06/26/14   Kristen N Ward, DO  metroNIDAZOLE (FLAGYL) 500  MG tablet Take 1 tablet (500 mg total) by mouth 2 (two) times daily. Patient not taking: Reported on 10/30/2014 08/11/14   Billy Fischer, MD  naproxen sodium (ANAPROX) 220 MG tablet Take 220 mg by mouth 2 (two) times daily as needed (pain).    Historical Provider, MD  oxyCODONE-acetaminophen (PERCOCET) 10-325 MG per tablet Take 1 tablet by mouth every 4 (four) hours as needed for pain. 10/30/14   Darlene Brozowski, PA-C  terconazole (TERAZOL 3) 80 MG vaginal suppository Place 1 suppository (80 mg total) vaginally at bedtime. Patient not taking: Reported on 10/30/2014 08/11/14   Billy Fischer, MD   BP 130/72 mmHg  Pulse 87  Temp(Src) 98.6 F (37 C) (Oral)  Resp 20  SpO2 100%  LMP 10/10/2014 Physical Exam  Constitutional: She is oriented to person, place, and time. She appears well-developed and well-nourished. No distress.  HENT:  Head: Normocephalic and atraumatic.  Eyes: Conjunctivae are normal. No scleral icterus.  Neck: Normal range of motion.  Cardiovascular: Normal rate, regular rhythm and intact distal pulses.   Pulmonary/Chest: Effort normal.  Abdominal: Soft. She exhibits no distension. There is no tenderness.  Lymphadenopathy:    She has no cervical adenopathy.  Neurological: She is alert and oriented to person, place, and time.  Skin: Skin is warm and dry. She is not diaphoretic. There is erythema.  Large 6x6 area of swelling, induration and mild erythema to the right axilla  Psychiatric: She has a normal mood and affect.  Nursing note and vitals reviewed.   ED Course  INCISION AND DRAINAGE Date/Time: 10/30/2014 10:48 AM Performed by: Abigail Butts Authorized by: Abigail Butts Consent: Verbal consent obtained. Risks and benefits: risks, benefits and alternatives were discussed Consent given by: patient Patient understanding: patient states understanding of the procedure being performed Patient consent: the patient's understanding of the procedure  matches consent given Procedure consent: procedure consent matches procedure scheduled Relevant documents: relevant documents present and verified Site marked: the operative site was marked Imaging studies: imaging studies available (bedside US) Required items: required blood products, implants, devices, and special equipment available Patient identity confirmed: verbally with patient and arm band Time out: Immediately prior to procedure a "time out" was called to verify the correct patient, procedure, equipment, support staff and site/side marked as required. Type: abscess Location: right axilla. Anesthesia: local infiltration Local anesthetic: lidocaine 1% without epinephrine Anesthetic total: 6 ml Patient sedated: no Scalpel size: 11 Needle gauge: 18 Incision type: single straight Complexity: complex Drainage: bloody Drainage amount: scant Wound treatment: wound left open Patient tolerance: Patient tolerated the procedure well with no immediate complications   (including critical care time) Labs Review Labs Reviewed - No data to display  Imaging Review No results found.   EKG Interpretation None  EMERGENCY DEPARTMENT US SOFT TISSUE INTERPRETATION "Study: Limited Ultrasound of the noted body part in comments below"  INDICATIONS: Pain Multiple views of the body part are obtained with a multi-frequency linear probe  PERFORMED BY:  Myself and Dr. Regenia Skeeter  IMAGES ARCHIVED?: Yes  SIDE:Right   BODY PART:Axilla  FINDINGS: Cellulitis present and Other large fluid collection  LIMITATIONS:  Body Habitus  INTERPRETATION:  Abcess present and Cellulitis present  COMMENT:  Large fluid collection noted in the mass in the right axilla    MDM   Final diagnoses:  Sebaceous cyst of right axilla  Abscess of right axilla    Laquita N Godden presents with fluid filled structure in the right axilla. Ultrasound confirms fluid approximately 1 cm below the skin surface.  On exam scar tissue is present overlying the area of induration from previous attempts at incision and drainage.   Incision and drainage performed over fluid collection visualized with ultrasound but only bloody fluid was expressed.  Needle aspiration was attempted at a different site also with visualized fluid collection with ultrasound but again without expression of purulent drainage.  Concern for cystic structure underlying the induration. Patient given clindamycin here in the emergency department as well as pain control. She will be discharged with the same with strict instructions to follow-up with general surgery this week. She is to return for fevers, chills, worsening erythema or purulent drainage.  I have personally reviewed patient's vitals, nursing note and any pertinent labs or imaging.  I performed an undressed physical exam.    It has been determined that no acute conditions requiring further emergency intervention are present at this time. The patient/guardian have been advised of the diagnosis and plan. I reviewed all labs and imaging including any potential incidental findings. We have discussed signs and symptoms that warrant return to the ED and they are listed in the discharge instructions.    Vital signs are stable at discharge.   BP 130/72 mmHg  Pulse 87  Temp(Src) 98.6 F (37 C) (Oral)  Resp 20  SpO2 100%  LMP 10/10/2014   The patient was discussed with and seen by Dr. Regenia Skeeter who agrees with the treatment plan.   Abigail Butts, PA-C 10/30/14 [place], MD 11/02/14 1627

## 2014-10-30 NOTE — ED Notes (Signed)
Pt from home with c/o right axilla abscess x 3 days.  Pt reports having a hx of the same in the same location last year.  Pt in NAD, ambulatory, A&O.

## 2014-10-30 NOTE — Discharge Instructions (Signed)
1. Medications: clindamycin, percocet, usual home medications 2. Treatment: rest, drink plenty of fluids, warm compresses 3. Follow Up: Please followup with your primary doctor in 2 days for discussion of your diagnoses and further evaluation after today's visit; if you do not have a primary care doctor use the resource guide provided to find one; Please return to the ER for worsening pain, increasing redness, fever or other concerning symptoms   Abscess An abscess is an infected area that contains a collection of pus and debris.It can occur in almost any part of the body. An abscess is also known as a furuncle or boil. CAUSES  An abscess occurs when tissue gets infected. This can occur from blockage of oil or sweat glands, infection of hair follicles, or a minor injury to the skin. As the body tries to fight the infection, pus collects in the area and creates pressure under the skin. This pressure causes pain. People with weakened immune systems have difficulty fighting infections and get certain abscesses more often.  SYMPTOMS Usually an abscess develops on the skin and becomes a painful mass that is red, warm, and tender. If the abscess forms under the skin, you may feel a moveable soft area under the skin. Some abscesses break open (rupture) on their own, but most will continue to get worse without care. The infection can spread deeper into the body and eventually into the bloodstream, causing you to feel ill.  DIAGNOSIS  Your caregiver will take your medical history and perform a physical exam. A sample of fluid may also be taken from the abscess to determine what is causing your infection. TREATMENT  Your caregiver may prescribe antibiotic medicines to fight the infection. However, taking antibiotics alone usually does not cure an abscess. Your caregiver may need to make a small cut (incision) in the abscess to drain the pus. In some cases, gauze is packed into the abscess to reduce pain and to  continue draining the area. HOME CARE INSTRUCTIONS   Only take over-the-counter or prescription medicines for pain, discomfort, or fever as directed by your caregiver.  If you were prescribed antibiotics, take them as directed. Finish them even if you start to feel better.  If gauze is used, follow your caregiver's directions for changing the gauze.  To avoid spreading the infection:  Keep your draining abscess covered with a bandage.  Wash your hands well.  Do not share personal care items, towels, or whirlpools with others.  Avoid skin contact with others.  Keep your skin and clothes clean around the abscess.  Keep all follow-up appointments as directed by your caregiver. SEEK MEDICAL CARE IF:   You have increased pain, swelling, redness, fluid drainage, or bleeding.  You have muscle aches, chills, or a general ill feeling.  You have a fever. MAKE SURE YOU:   Understand these instructions.  Will watch your condition.  Will get help right away if you are not doing well or get worse. Document Released: 09/16/2005 Document Revised: 06/07/2012 Document Reviewed: 02/19/2012 [facility] Patient Information 2015 [place], [place]. This information is not intended to replace advice given to you by your health care provider. Make sure you discuss any questions you have with your health care provider.

## 2014-10-31 ENCOUNTER — Encounter (HOSPITAL_COMMUNITY): Payer: Self-pay | Admitting: *Deleted

## 2014-10-31 ENCOUNTER — Ambulatory Visit (INDEPENDENT_AMBULATORY_CARE_PROVIDER_SITE_OTHER): Payer: Self-pay | Admitting: Surgery

## 2014-10-31 ENCOUNTER — Inpatient Hospital Stay (HOSPITAL_COMMUNITY)
Admission: AD | Admit: 2014-10-31 | Discharge: 2014-11-03 | DRG: 603 | Disposition: A | Discharge status: Home / Self Care | Payer: Medicaid Other | Source: Ambulatory Visit | Attending: General Surgery | Admitting: General Surgery

## 2014-10-31 DIAGNOSIS — L02411 Cutaneous abscess of right axilla: Principal | ICD-10-CM | POA: Diagnosis present

## 2014-10-31 DIAGNOSIS — E669 Obesity, unspecified: Secondary | ICD-10-CM | POA: Diagnosis present

## 2014-10-31 DIAGNOSIS — D500 Iron deficiency anemia secondary to blood loss (chronic): Secondary | ICD-10-CM

## 2014-10-31 DIAGNOSIS — L02419 Cutaneous abscess of limb, unspecified: Secondary | ICD-10-CM | POA: Diagnosis present

## 2014-10-31 DIAGNOSIS — E875 Hyperkalemia: Secondary | ICD-10-CM | POA: Diagnosis present

## 2014-10-31 DIAGNOSIS — D509 Iron deficiency anemia, unspecified: Secondary | ICD-10-CM | POA: Diagnosis present

## 2014-10-31 DIAGNOSIS — Z6830 Body mass index (BMI) 30.0-30.9, adult: Secondary | ICD-10-CM

## 2014-10-31 LAB — BASIC METABOLIC PANEL
ANION GAP: 8 (ref 5–15)
BUN: 5 mg/dL — ABNORMAL LOW (ref 6–23)
CO2: 20 meq/L (ref 19–32)
Calcium: 7.4 mg/dL — ABNORMAL LOW (ref 8.4–10.5)
Chloride: 112 mEq/L (ref 96–112)
Creatinine, Ser: 0.53 mg/dL (ref 0.50–1.10)
GFR calc Af Amer: >90 mL/min (ref >90)
GFR calc non Af Amer: >90 mL/min (ref >90)
Glucose, Bld: 83 mg/dL (ref 70–99)
Potassium: 6.4 mEq/L — ABNORMAL HIGH (ref 3.7–5.3)
SODIUM: 140 meq/L (ref 137–147)

## 2014-10-31 LAB — CBC
HCT: 20.6 % — ABNORMAL LOW (ref 36.0–46.0)
Hemoglobin: 6.2 g/dL — CL (ref 12.0–15.0)
MCH: 17.9 pg — AB (ref 26.0–34.0)
MCHC: 30.1 g/dL (ref 30.0–36.0)
MCV: 59.5 fL — ABNORMAL LOW (ref 78.0–100.0)
PLATELETS: 175 10*3/uL (ref 150–400)
RBC: 3.46 MIL/uL — AB (ref 3.87–5.11)
RDW: 19 % — ABNORMAL HIGH (ref 11.5–15.5)
WBC: 8.9 10*3/uL (ref 4.0–10.5)

## 2014-10-31 MED ORDER — POTASSIUM CHLORIDE IN NACL 20-0.9 MEQ/L-% IV SOLN
INTRAVENOUS | Status: DC
  Administered 2014-10-31 – 2014-11-01 (×2): 100 mL via INTRAVENOUS
  Filled 2014-10-31 (×2): qty 1000

## 2014-10-31 MED ORDER — HYDROMORPHONE HCL 1 MG/ML IJ SOLN
1.0000 mg | INTRAMUSCULAR | Status: DC | PRN
  Administered 2014-10-31 – 2014-11-03 (×12): 1 mg via INTRAVENOUS
  Filled 2014-10-31 (×13): qty 1

## 2014-10-31 MED ORDER — PIPERACILLIN-TAZOBACTAM 3.375 G IVPB
3.3750 g | Freq: Three times a day (TID) | INTRAVENOUS | Status: DC
  Administered 2014-10-31 – 2014-11-03 (×8): 3.375 g via INTRAVENOUS
  Filled 2014-10-31 (×9): qty 50

## 2014-10-31 MED ORDER — ONDANSETRON HCL 4 MG/2ML IJ SOLN
4.0000 mg | Freq: Four times a day (QID) | INTRAMUSCULAR | Status: DC | PRN
  Administered 2014-11-01 – 2014-11-02 (×3): 4 mg via INTRAVENOUS
  Filled 2014-10-31 (×3): qty 2

## 2014-10-31 NOTE — Plan of Care (Signed)
Problem: Consults Goal: Skin Care Protocol Initiated - if Braden Score 18 or less If consults are not indicated, leave blank or document N/A  Outcome: Not Applicable Date Met:  48/01/65 Goal: Diabetes Guidelines if Diabetic/Glucose > 140 If diabetic or lab glucose is > 140 mg/dl - Initiate Diabetes/Hyperglycemia Guidelines & Document Interventions  Outcome: Not Applicable Date Met:  53/74/82

## 2014-10-31 NOTE — H&P (Signed)
Beth Gibson is an 28 y.o. female.   Chief Complaint: Right axillary abscess HPI: Patient words: abscess under rt. armpit.  The patient is a 28 year old female who presents with a subcutaneous abscess. This is a 28 yo female who presents with several days of worsening pain and swelling under her right axilla. She presented to the ED yesterday for evaluation where a bedside ultrasound revealed a large fluid collection. A bedside incision and drainage was attempted but very little drainage was encountered. The patient did not tolerate the bedside procedure very well. She states that it feels more swollen and tender today. She presents to Urgent Office for evaluation. She states that she has been NPO except for ice chips since 10 AM.  Past Medical History  Diagnosis Date  . Chlamydia   . Trichomonas   . Preterm labor   . Headache(784.0)   . UTI (lower urinary tract infection)   . BV (bacterial vaginosis)   . Yeast vaginitis   . Abscess of axilla, right     Past Surgical History  Procedure Laterality Date  . Cyst removed from l buttock    . Cesarean section    . Wisdom tooth extraction    . Wisdom tooth extraction  2009  . Cystectomy  2009    left buttock  . Cesarean section  04/18/2012    Procedure: CESAREAN SECTION;  Surgeon: Guss Bunde, MD;  Location: [facility];  Service: Gynecology;  Laterality: N/A;  repeat    Family History  Problem Relation Age of Onset  . Hypertension Mother    Social History:  reports that she has never smoked. She has never used smokeless tobacco. She reports that she drinks about 0.5 oz of alcohol per week. She reports that she does not use illicit drugs.  Allergies:  Allergies  Allergen Reactions  . Vicodin [Hydrocodone-Acetaminophen] Itching    Prior to Admission medications   Medication Sig Start Date End Date Taking? Authorizing Provider  acetaminophen (TYLENOL) 500 MG tablet Take 1,000 mg by mouth every 6 (six) hours as needed for  moderate pain.    Historical Provider, MD  azithromycin (ZITHROMAX) 500 MG tablet Take 2 tablets (1,000 mg total) by mouth once. Patient not taking: Reported on 10/30/2014 08/16/14   Harden Mo, MD  clindamycin (CLEOCIN) 150 MG capsule Take 3 capsules (450 mg total) by mouth 3 (three) times daily. 10/30/14   Hannah Muthersbaugh, PA-C  fluconazole (DIFLUCAN) 150 MG tablet Take 1 tablet (150 mg total) by mouth once. Patient not taking: Reported on 10/30/2014 06/26/14   Kristen N Ward, DO  fluconazole (DIFLUCAN) 150 MG tablet Take 1 tablet (150 mg total) by mouth once. Repeat in 1 week. Patient not taking: Reported on 10/30/2014 08/11/14   Billy Fischer, MD  HYDROcodone-acetaminophen (NORCO/VICODIN) 5-325 MG per tablet Take 1-2 tablets by mouth every 4 (four) hours as needed. Patient not taking: Reported on 10/30/2014 08/06/14   [name], MD  ibuprofen (ADVIL,MOTRIN) 800 MG tablet Take 1,800 mg by mouth every 8 (eight) hours as needed for moderate pain.    Historical Provider, MD  metroNIDAZOLE (FLAGYL) 500 MG tablet Take 1 tablet (500 mg total) by mouth 2 (two) times daily. Patient not taking: Reported on 10/30/2014 06/26/14   Kristen N Ward, DO  metroNIDAZOLE (FLAGYL) 500 MG tablet Take 1 tablet (500 mg total) by mouth 2 (two) times daily. Patient not taking: Reported on 10/30/2014 08/11/14   Billy Fischer, MD  naproxen sodium (ANAPROX) 220 MG tablet Take 220 mg by mouth 2 (two) times daily as needed (pain).    Historical Provider, MD  oxyCODONE-acetaminophen (PERCOCET) 10-325 MG per tablet Take 1 tablet by mouth every 4 (four) hours as needed for pain. 10/30/14   Hannah Muthersbaugh, PA-C  terconazole (TERAZOL 3) 80 MG vaginal suppository Place 1 suppository (80 mg total) vaginally at bedtime. Patient not taking: Reported on 10/30/2014 08/11/14   Billy Fischer, MD    No results found for this or any previous visit (from the past 48 hour(s)). No results found.  ROS  Last menstrual period  10/10/2014. Physical Exam  Vitals Briant Cedar CMA; 10/31/2014 3:15 PM) 10/31/2014 3:14 PM Weight: 201.5 lb Height: 68in Body Surface Area: 2.09 m Body Mass Index: 30.64 kg/m Temp.: 98.10F  Pulse: 62 (Regular)  BP: 122/80 (Sitting, Left Arm, Standard)     Physical Exam Rodman Key K. Perri Aragones MD; 10/31/2014 4:04 PM)  The physical exam findings are as follows: Note:WDWN in NAD Right axilla - large area of induration and fluctuance measuring about 8 x 6 cm. Very tender to palpation. Incision is open with minimal drainage. Assessment/Plan  ABSCESS OF RIGHT AXILLA (682.3  L02.411)  Current Plans Instructions: Direct admission to [facility] for incision and drainage of right axillary abscess. The patient reports that her axilla is far too tender to consider another attempt at bedside I&D. We will admit her for IV antibiotics, IV hydration and surgery probably tomorrow.  Will Schier K. 10/31/2014, 4:05 PM

## 2014-11-01 ENCOUNTER — Encounter (HOSPITAL_COMMUNITY): Admission: AD | Disposition: A | Discharge status: Home / Self Care | Payer: Self-pay | Source: Ambulatory Visit

## 2014-11-01 ENCOUNTER — Encounter (HOSPITAL_COMMUNITY): Payer: Self-pay | Admitting: Internal Medicine

## 2014-11-01 ENCOUNTER — Observation Stay (HOSPITAL_COMMUNITY): Payer: Medicaid Other | Admitting: Anesthesiology

## 2014-11-01 DIAGNOSIS — D509 Iron deficiency anemia, unspecified: Secondary | ICD-10-CM

## 2014-11-01 DIAGNOSIS — L02411 Cutaneous abscess of right axilla: Secondary | ICD-10-CM | POA: Diagnosis present

## 2014-11-01 DIAGNOSIS — E669 Obesity, unspecified: Secondary | ICD-10-CM | POA: Diagnosis present

## 2014-11-01 DIAGNOSIS — E875 Hyperkalemia: Secondary | ICD-10-CM | POA: Diagnosis present

## 2014-11-01 DIAGNOSIS — D500 Iron deficiency anemia secondary to blood loss (chronic): Secondary | ICD-10-CM

## 2014-11-01 DIAGNOSIS — Z6830 Body mass index (BMI) 30.0-30.9, adult: Secondary | ICD-10-CM | POA: Diagnosis not present

## 2014-11-01 HISTORY — PX: INCISION AND DRAINAGE ABSCESS: SHX5864

## 2014-11-01 LAB — APTT: APTT: 29 s (ref 24–37)

## 2014-11-01 LAB — FERRITIN: Ferritin: 26 ng/mL (ref 10–291)

## 2014-11-01 LAB — IRON AND TIBC
Iron: <10 ug/dL — ABNORMAL LOW (ref 42–135)
UIBC: 371 ug/dL (ref 125–400)

## 2014-11-01 LAB — COMPREHENSIVE METABOLIC PANEL
ALK PHOS: 49 U/L (ref 39–117)
ALT: 10 U/L (ref 0–35)
ANION GAP: 12 (ref 5–15)
AST: 14 U/L (ref 0–37)
Albumin: 3.2 g/dL — ABNORMAL LOW (ref 3.5–5.2)
BILIRUBIN TOTAL: 0.4 mg/dL (ref 0.3–1.2)
BUN: 7 mg/dL (ref 6–23)
CHLORIDE: 100 meq/L (ref 96–112)
CO2: 23 mEq/L (ref 19–32)
Calcium: 9 mg/dL (ref 8.4–10.5)
Creatinine, Ser: 0.7 mg/dL (ref 0.50–1.10)
GLUCOSE: 104 mg/dL — AB (ref 70–99)
POTASSIUM: 3.8 meq/L (ref 3.7–5.3)
Sodium: 135 mEq/L — ABNORMAL LOW (ref 137–147)
Total Protein: 8.1 g/dL (ref 6.0–8.3)

## 2014-11-01 LAB — CBC
HCT: 26.4 % — ABNORMAL LOW (ref 36.0–46.0)
Hemoglobin: 7.7 g/dL — ABNORMAL LOW (ref 12.0–15.0)
MCH: 17.3 pg — AB (ref 26.0–34.0)
MCHC: 29.2 g/dL — ABNORMAL LOW (ref 30.0–36.0)
MCV: 59.5 fL — ABNORMAL LOW (ref 78.0–100.0)
Platelets: 204 10*3/uL (ref 150–400)
RBC: 4.44 MIL/uL (ref 3.87–5.11)
RDW: 19.2 % — ABNORMAL HIGH (ref 11.5–15.5)
WBC: 9.9 10*3/uL (ref 4.0–10.5)

## 2014-11-01 LAB — ABO/RH: ABO/RH(D): B POS

## 2014-11-01 LAB — PROTIME-INR
INR: 1.2 (ref 0.00–1.49)
Prothrombin Time: 15.3 seconds — ABNORMAL HIGH (ref 11.6–15.2)

## 2014-11-01 LAB — TYPE AND SCREEN
ABO/RH(D): B POS
Antibody Screen: NEGATIVE

## 2014-11-01 LAB — SURGICAL PCR SCREEN
MRSA, PCR: NEGATIVE
STAPHYLOCOCCUS AUREUS: POSITIVE — AB

## 2014-11-01 LAB — RETICULOCYTES
RBC.: 4.17 MIL/uL (ref 3.87–5.11)
RETIC CT PCT: 1 % (ref 0.4–3.1)
Retic Count, Absolute: 41.7 10*3/uL (ref 19.0–186.0)

## 2014-11-01 LAB — FOLATE: Folate: 5 ng/mL

## 2014-11-01 LAB — VITAMIN B12: Vitamin B-12: 263 pg/mL (ref 211–911)

## 2014-11-01 SURGERY — INCISION AND DRAINAGE, ABSCESS
Anesthesia: General | Site: Axilla | Laterality: Right

## 2014-11-01 MED ORDER — ONDANSETRON HCL 4 MG/2ML IJ SOLN
INTRAMUSCULAR | Status: DC | PRN
  Administered 2014-11-01: 4 mg via INTRAVENOUS

## 2014-11-01 MED ORDER — MUPIROCIN 2 % EX OINT
TOPICAL_OINTMENT | Freq: Two times a day (BID) | CUTANEOUS | Status: DC
  Administered 2014-11-01: 23:00:00 via NASAL
  Administered 2014-11-01 – 2014-11-02 (×2): 1 via NASAL
  Administered 2014-11-02: 10:00:00 via NASAL
  Administered 2014-11-03: 1 via NASAL
  Filled 2014-11-01: qty 22

## 2014-11-01 MED ORDER — PIPERACILLIN-TAZOBACTAM 3.375 G IVPB
INTRAVENOUS | Status: AC
  Filled 2014-11-01: qty 50

## 2014-11-01 MED ORDER — FENTANYL CITRATE 0.05 MG/ML IJ SOLN
INTRAMUSCULAR | Status: AC
  Filled 2014-11-01: qty 2

## 2014-11-01 MED ORDER — PROMETHAZINE HCL 25 MG/ML IJ SOLN: 6.2500 mg | INTRAMUSCULAR | Status: DC | PRN

## 2014-11-01 MED ORDER — LACTATED RINGERS IV SOLN
INTRAVENOUS | Status: DC
  Administered 2014-11-01: 1000 mL via INTRAVENOUS

## 2014-11-01 MED ORDER — FENTANYL CITRATE 0.05 MG/ML IJ SOLN
INTRAMUSCULAR | Status: DC | PRN
  Administered 2014-11-01 (×4): 50 ug via INTRAVENOUS

## 2014-11-01 MED ORDER — POLYETHYLENE GLYCOL 3350 17 G PO PACK
17.0000 g | PACK | Freq: Every day | ORAL | Status: DC
  Administered 2014-11-02 – 2014-11-03 (×2): 17 g via ORAL
  Filled 2014-11-01 (×3): qty 1

## 2014-11-01 MED ORDER — HYDROMORPHONE HCL 1 MG/ML IJ SOLN
0.2500 mg | INTRAMUSCULAR | Status: DC | PRN
  Administered 2014-11-01 (×2): 0.5 mg via INTRAVENOUS

## 2014-11-01 MED ORDER — FERROUS SULFATE 325 (65 FE) MG PO TABS
325.0000 mg | ORAL_TABLET | Freq: Three times a day (TID) | ORAL | Status: DC
  Administered 2014-11-01 – 2014-11-03 (×5): 325 mg via ORAL
  Filled 2014-11-01 (×9): qty 1

## 2014-11-01 MED ORDER — PROPOFOL 10 MG/ML IV BOLUS
INTRAVENOUS | Status: DC | PRN
  Administered 2014-11-01: 200 mg via INTRAVENOUS

## 2014-11-01 MED ORDER — LIDOCAINE HCL (CARDIAC) 20 MG/ML IV SOLN
INTRAVENOUS | Status: AC
  Filled 2014-11-01: qty 5

## 2014-11-01 MED ORDER — PROPOFOL 10 MG/ML IV BOLUS
INTRAVENOUS | Status: AC
  Filled 2014-11-01: qty 20

## 2014-11-01 MED ORDER — HYDROMORPHONE HCL 1 MG/ML IJ SOLN
INTRAMUSCULAR | Status: AC
  Filled 2014-11-01: qty 1

## 2014-11-01 MED ORDER — LIDOCAINE HCL (CARDIAC) 20 MG/ML IV SOLN
INTRAVENOUS | Status: DC | PRN
  Administered 2014-11-01: 100 mg via INTRAVENOUS

## 2014-11-01 MED ORDER — ONDANSETRON HCL 4 MG/2ML IJ SOLN
INTRAMUSCULAR | Status: AC
  Filled 2014-11-01: qty 2

## 2014-11-01 MED ORDER — MIDAZOLAM HCL 2 MG/2ML IJ SOLN
INTRAMUSCULAR | Status: AC
  Filled 2014-11-01: qty 2

## 2014-11-01 SURGICAL SUPPLY — 36 items
BLADE SURG 15 STRL LF DISP TIS (BLADE) ×1 IMPLANT
BLADE SURG 15 STRL SS (BLADE) ×3
BNDG GAUZE ELAST 4 BULKY (GAUZE/BANDAGES/DRESSINGS) IMPLANT
CANISTER SUCTION 2500CC (MISCELLANEOUS) ×3 IMPLANT
DECANTER SPIKE VIAL GLASS SM (MISCELLANEOUS) IMPLANT
DRAPE LAPAROSCOPIC ABDOMINAL (DRAPES) IMPLANT
DRAPE ORTHO SPLIT 77X108 STRL (DRAPES) ×6
DRAPE SURG ORHT 6 SPLT 77X108 (DRAPES) IMPLANT
DRSG PAD ABDOMINAL 8X10 ST (GAUZE/BANDAGES/DRESSINGS) IMPLANT
ELECT REM PT RETURN 9FT ADLT (ELECTROSURGICAL) ×3
ELECTRODE REM PT RTRN 9FT ADLT (ELECTROSURGICAL) ×1 IMPLANT
GAUZE PACKING IODOFORM 2 (PACKING) ×2 IMPLANT
GAUZE SPONGE 4X4 12PLY STRL (GAUZE/BANDAGES/DRESSINGS) IMPLANT
GLOVE BIO SURGEON STRL SZ 6.5 (GLOVE) ×2 IMPLANT
GLOVE BIO SURGEONS STRL SZ 6.5 (GLOVE) ×1
GLOVE BIOGEL PI IND STRL 7.0 (GLOVE) ×1 IMPLANT
GLOVE BIOGEL PI INDICATOR 7.0 (GLOVE) ×2
GOWN L4 XXLG W/PAP TWL (GOWN DISPOSABLE) ×3 IMPLANT
KIT BASIN OR (CUSTOM PROCEDURE TRAY) ×3 IMPLANT
NDL HYPO 25X1 1.5 SAFETY (NEEDLE) IMPLANT
NEEDLE HYPO 25X1 1.5 SAFETY (NEEDLE) IMPLANT
NS IRRIG 1000ML POUR BTL (IV SOLUTION) ×3 IMPLANT
PACK BASIC VI WITH GOWN DISP (CUSTOM PROCEDURE TRAY) ×3 IMPLANT
PAD ABD 8X10 STRL (GAUZE/BANDAGES/DRESSINGS) ×2 IMPLANT
PENCIL BUTTON HOLSTER BLD 10FT (ELECTRODE) ×3 IMPLANT
SPONGE LAP 18X18 X RAY DECT (DISPOSABLE) ×3 IMPLANT
SUT MNCRL AB 4-0 PS2 18 (SUTURE) IMPLANT
SUT VIC AB 3-0 SH 27 (SUTURE)
SUT VIC AB 3-0 SH 27XBRD (SUTURE) IMPLANT
SWAB COLLECTION DEVICE MRSA (MISCELLANEOUS) IMPLANT
SYR CONTROL 10ML LL (SYRINGE) IMPLANT
TAPE CLOTH SURG 4X10 WHT LF (GAUZE/BANDAGES/DRESSINGS) ×2 IMPLANT
TOWEL OR 17X26 10 PK STRL BLUE (TOWEL DISPOSABLE) ×3 IMPLANT
TUBE ANAEROBIC SPECIMEN COL (MISCELLANEOUS) IMPLANT
WATER STERILE IRR 1000ML POUR (IV SOLUTION) IMPLANT
YANKAUER SUCT BULB TIP NO VENT (SUCTIONS) ×3 IMPLANT

## 2014-11-01 NOTE — Progress Notes (Signed)
Microcytic anemia  Subjective: Did well overnight.  Still with significant r axillary pain.    Objective: Vital signs in last 24 hours: Temp:  [98.2 F (36.8 C)-99.7 F (37.6 C)] 98.5 F (36.9 C) (11/12 0600) Pulse Rate:  [98-105] 99 (11/12 0600) Resp:  [16-20] 18 (11/12 0600) BP: (121-135)/(72-80) 124/77 mmHg (11/12 0600) SpO2:  [97 %-100 %] 99 % (11/12 0600) Weight:  [201 lb (91.173 kg)] 201 lb (91.173 kg) (11/11 1818) Last BM Date: 10/30/14  Intake/Output from previous day: 11/11 0701 - 11/12 0700 In: 776.7 [I.V.:776.7] Out: 0  Intake/Output this shift:    General appearance: alert and cooperative Extremities: R axillary abscess Incision/Wound:  Lab Results:  Results for orders placed or performed during the hospital encounter of 10/31/14 (from the past 24 hour(s))  Basic metabolic panel     Status: Abnormal   Collection Time: 10/31/14 10:38 PM  Result Value Ref Range   Sodium 140 137 - 147 mEq/L   Potassium 6.4 (H) 3.7 - 5.3 mEq/L   Chloride 112 96 - 112 mEq/L   CO2 20 19 - 32 mEq/L   Glucose, Bld 83 70 - 99 mg/dL   BUN 5 (L) 6 - 23 mg/dL   Creatinine, Ser 0.53 0.50 - 1.10 mg/dL   Calcium 7.4 (L) 8.4 - 10.5 mg/dL   GFR calc non Af Amer >90 >90 mL/min   GFR calc Af Amer >90 >90 mL/min   Anion gap 8 5 - 15  CBC     Status: Abnormal   Collection Time: 10/31/14 10:38 PM  Result Value Ref Range   WBC 8.9 4.0 - 10.5 K/uL   RBC 3.46 (L) 3.87 - 5.11 MIL/uL   Hemoglobin 6.2 (LL) 12.0 - 15.0 g/dL   HCT 20.6 (L) 36.0 - 46.0 %   MCV 59.5 (L) 78.0 - 100.0 fL   MCH 17.9 (L) 26.0 - 34.0 pg   MCHC 30.1 30.0 - 36.0 g/dL   RDW 19.0 (H) 11.5 - 15.5 %   Platelets 175 150 - 400 K/uL  Surgical pcr screen     Status: Abnormal   Collection Time: 11/01/14 12:23 AM  Result Value Ref Range   MRSA, PCR NEGATIVE NEGATIVE   Staphylococcus aureus POSITIVE (A) NEGATIVE  CBC     Status: Abnormal   Collection Time: 11/01/14  7:00 AM  Result Value Ref Range   WBC 9.9 4.0 - 10.5 K/uL    RBC 4.44 3.87 - 5.11 MIL/uL   Hemoglobin 7.7 (L) 12.0 - 15.0 g/dL   HCT 26.4 (L) 36.0 - 46.0 %   MCV 59.5 (L) 78.0 - 100.0 fL   MCH 17.3 (L) 26.0 - 34.0 pg   MCHC 29.2 (L) 30.0 - 36.0 g/dL   RDW 19.2 (H) 11.5 - 15.5 %   Platelets 204 150 - 400 K/uL  Type and screen     Status: None   Collection Time: 11/01/14  7:00 AM  Result Value Ref Range   ABO/RH(D) B POS    Antibody Screen NEG    Sample Expiration 11/04/2014   ABO/Rh     Status: None   Collection Time: 11/01/14  7:00 AM  Result Value Ref Range   ABO/RH(D) B POS   Reticulocytes     Status: None   Collection Time: 11/01/14  8:15 AM  Result Value Ref Range   Retic Ct Pct 1.0 0.4 - 3.1 %   RBC. 4.17 3.87 - 5.11 MIL/uL   Retic Count, Manual 41.7  19.0 - 186.0 K/uL  APTT     Status: None   Collection Time: 11/01/14  8:15 AM  Result Value Ref Range   aPTT 29 24 - 37 seconds  Protime-INR     Status: Abnormal   Collection Time: 11/01/14  8:15 AM  Result Value Ref Range   Prothrombin Time 15.3 (H) 11.6 - 15.2 seconds   INR 1.20 0.00 - 1.49     Studies/Results Radiology     MEDS, Scheduled . ferrous sulfate  325 mg Oral TID WC  . mupirocin ointment   Nasal BID  . piperacillin-tazobactam (ZOSYN)  IV  3.375 g Intravenous 3 times per day  . polyethylene glycol  17 g Oral Daily     Assessment: Microcytic anemia R axillary abscess  Plan: OR today for I&D Will have hospitalist evaluate cause of anemia.     LOS: 1 day    Rosario Adie, [facility], [place]   11/01/2014 9:23 AM

## 2014-11-01 NOTE — Anesthesia Preprocedure Evaluation (Addendum)
Anesthesia Evaluation  Patient identified by MRN, date of birth, ID band Patient awake    Reviewed: Allergy & Precautions, H&P , NPO status , Patient's Chart, lab work & pertinent test results  Airway Mallampati: II  TM Distance: >3 FB Neck ROM: Full    Dental no notable dental hx.    Pulmonary neg pulmonary ROS,  breath sounds clear to auscultation  Pulmonary exam normal       Cardiovascular negative cardio ROS  Rhythm:Regular Rate:Normal     Neuro/Psych negative neurological ROS  negative psych ROS   GI/Hepatic negative GI ROS, Neg liver ROS,   Endo/Other  obesity  Renal/GU negative Renal ROS  negative genitourinary   Musculoskeletal negative musculoskeletal ROS (+)   Abdominal   Peds negative pediatric ROS (+)  Hematology  (+) anemia ,   Anesthesia Other Findings   Reproductive/Obstetrics negative OB ROS                            Anesthesia Physical Anesthesia Plan  ASA: III  Anesthesia Plan: General   Post-op Pain Management:    Induction: Intravenous  Airway Management Planned: LMA  Additional Equipment:   Intra-op Plan:   Post-operative Plan: Extubation in OR  Informed Consent: I have reviewed the patients History and Physical, chart, labs and discussed the procedure including the risks, benefits and alternatives for the proposed anesthesia with the patient or authorized representative who has indicated his/her understanding and acceptance.   Dental advisory given  Plan Discussed with: CRNA and Surgeon  Anesthesia Plan Comments:         Anesthesia Quick Evaluation

## 2014-11-01 NOTE — Progress Notes (Addendum)
CRITICAL VALUE ALERT  Critical value received:  Hemoglobin 6.2 Date of notification:  10/31/2014  Time of notification:  2345  Critical value read back:Yes.    Nurse who received alert:  Woodroe Chen  MD notified (1st page): [place],MD  Time of second page:  Responding MD:  Lysbeth Penner  Time MD responded:

## 2014-11-01 NOTE — Plan of Care (Signed)
Problem: Phase I Progression Outcomes Goal: Pain controlled with appropriate interventions Outcome: Progressing Goal: Tubes/drains patent Outcome: Not Applicable Date Met:  12/87/86  Problem: Phase II Progression Outcomes Goal: Progressing with IS, TCDB Outcome: Completed/Met Date Met:  11/01/14

## 2014-11-01 NOTE — Consult Note (Signed)
[facility] Medical Consultation  Beth Gibson DZH:[id number] DOB: 21-Dec-1986 DOA: 10/31/2014 PCP: No primary care provider on file.   Requesting physician: Dr. Georgette Dover Date of consultation: 11.12.2015 Reason for consultation: Anemia  Impression/Recommendations Microcytic anemia - MCV significantly low with elevated RDW, check an anemia panel. - transfuse 1 unit of packed red blood cells, this is a menstruating young female gravida 1. - We'll probably need to go home on oral iron 3 times a day. - start Mira lax she'll be on oral iron and is currently on narcotics.  Axillary abscess - Surgery status post I&D on zosyn and started on 10/31/2014.  Hyperkalemia - she is in ibuprofen at home she has no acute renal failure. I will go ahead and stop her IV fluids. Recheck a basic metabolic panel. Cultures chest EKG   I will followup again tomorrow. Please contact me if I can be of assistance in the meanwhile. Thank you for this consultation.  Chief Complaint: Anemia  HPI:  28 year old female G1 P1admitted by general surgery first obtain use abscess, status post I&D on 10/31/2014, started on empiric antibiotics (Zosyn). She relates that it started with pain and some fevers at home during I and D she had large amounts of purulent drainage. Still feels swollen and tender today.   Review of Systems:  Constitutional:  No weight loss, night sweats, Fevers, chills, fatigue.  HEENT:  No headaches, Difficulty swallowing,Tooth/dental problems,Sore throat,  No sneezing, itching, ear ache, nasal congestion, post nasal drip,  Cardio-vascular:  No chest pain, Orthopnea, PND, swelling in lower extremities, anasarca, dizziness, palpitations  GI:  No heartburn, indigestion, abdominal pain, nausea, vomiting, diarrhea, change in bowel habits, loss of appetite  Resp:  No shortness of breath with exertion or at rest. No excess mucus, no productive cough, No non-productive cough, No coughing up of  blood.No change in color of mucus.No wheezing.No chest wall deformity  Skin:  no rash or lesions.  GU:  no dysuria, change in color of urine, no urgency or frequency. No flank pain.  Musculoskeletal:  No joint pain or swelling. No decreased range of motion. No back pain.  Psych:  No change in mood or affect. No depression or anxiety. No memory loss.   Past Medical History  Diagnosis Date  . Chlamydia   . Trichomonas   . Preterm labor   . Headache(784.0)   . UTI (lower urinary tract infection)   . BV (bacterial vaginosis)   . Yeast vaginitis   . Abscess of axilla, right     Past Surgical History  Procedure Laterality Date  . Cyst removed from l buttock    . Cesarean section    . Wisdom tooth extraction    . Wisdom tooth extraction  2009  . Cystectomy  2009    left buttock  . Cesarean section  04/18/2012    Procedure: CESAREAN SECTION; Surgeon: Guss Bunde, MD; Location: [facility]; Service: Gynecology; Laterality: N/A; repeat    Family History  Problem Relation Age of Onset  . Hypertension Mother    Social History:  reports that she has never smoked. She has never used smokeless tobacco. She reports that she drinks about 0.5 oz of alcohol per week. She reports that she does not use illicit drugs.  Allergies:  Allergies  Allergen Reactions  . Vicodin [Hydrocodone-Acetaminophen] Itching    Prior to Admission medications   Medication Sig Start Date End Date Taking? Authorizing Provider  acetaminophen (TYLENOL) 500 MG tablet Take 1,000 mg  by mouth every 6 (six) hours as needed for moderate pain.    Historical Provider, MD  azithromycin (ZITHROMAX) 500 MG tablet Take 2 tablets (1,000 mg total) by mouth once. Patient not taking: Reported on 10/30/2014 08/16/14   Harden Mo, MD  clindamycin (CLEOCIN) 150 MG capsule Take 3 capsules (450 mg total) by mouth 3 (three)  times daily. 10/30/14   Hannah Muthersbaugh, PA-C  fluconazole (DIFLUCAN) 150 MG tablet Take 1 tablet (150 mg total) by mouth once. Patient not taking: Reported on 10/30/2014 06/26/14   Kristen N Ward, DO  fluconazole (DIFLUCAN) 150 MG tablet Take 1 tablet (150 mg total) by mouth once. Repeat in 1 week. Patient not taking: Reported on 10/30/2014 08/11/14   Billy Fischer, MD  HYDROcodone-acetaminophen (NORCO/VICODIN) 5-325 MG per tablet Take 1-2 tablets by mouth every 4 (four) hours as needed. Patient not taking: Reported on 10/30/2014 08/06/14   [name], MD  ibuprofen (ADVIL,MOTRIN) 800 MG tablet Take 1,800 mg by mouth every 8 (eight) hours as needed for moderate pain.    Historical Provider, MD  metroNIDAZOLE (FLAGYL) 500 MG tablet Take 1 tablet (500 mg total) by mouth 2 (two) times daily. Patient not taking: Reported on 10/30/2014 06/26/14   Kristen N Ward, DO  metroNIDAZOLE (FLAGYL) 500 MG tablet Take 1 tablet (500 mg total) by mouth 2 (two) times daily. Patient not taking: Reported on 10/30/2014 08/11/14   Billy Fischer, MD  naproxen sodium (ANAPROX) 220 MG tablet Take 220 mg by mouth 2 (two) times daily as needed (pain).    Historical Provider, MD  oxyCODONE-acetaminophen (PERCOCET) 10-325 MG per tablet Take 1 tablet by mouth every 4 (four) hours as needed for pain. 10/30/14   Hannah Muthersbaugh, PA-C  terconazole (TERAZOL 3) 80 MG vaginal suppository Place 1 suppository (80 mg total) vaginally at bedtime. Patient not taking: Reported on 10/30/2014 08/11/14   Billy Fischer, MD     Past Medical History  Diagnosis Date  . Chlamydia   . Trichomonas   . Preterm labor   . Headache(784.0)   . UTI (lower urinary tract infection)   . BV (bacterial vaginosis)   . Yeast vaginitis   . Abscess of axilla, right    Past Surgical History  Procedure Laterality Date  . Cyst removed from l buttock    . Cesarean section    . Wisdom tooth  extraction    . Wisdom tooth extraction  2009  . Cystectomy  2009    left buttock  . Cesarean section  04/18/2012    Procedure: CESAREAN SECTION;  Surgeon: Guss Bunde, MD;  Location: [facility];  Service: Gynecology;  Laterality: N/A;  repeat   Social History:  reports that she has never smoked. She has never used smokeless tobacco. She reports that she drinks about 0.5 oz of alcohol per week. She reports that she does not use illicit drugs.  Allergies  Allergen Reactions  . Vicodin [Hydrocodone-Acetaminophen] Itching   Family History  Problem Relation Age of Onset  . Hypertension Mother   . Diabetes Mellitus I Father     Prior to Admission medications   Medication Sig Start Date End Date Taking? Authorizing Provider  acetaminophen (TYLENOL) 500 MG tablet Take 1,000 mg by mouth every 6 (six) hours as needed for moderate pain (pain).    Yes Historical Provider, MD  clindamycin (CLEOCIN) 150 MG capsule Take 3 capsules (450 mg total) by mouth 3 (three) times daily. 10/30/14  Yes Jarrett Soho  Muthersbaugh, PA-C  oxyCODONE-acetaminophen (PERCOCET) 10-325 MG per tablet Take 1 tablet by mouth every 4 (four) hours as needed for pain. 10/30/14  Yes Hannah Muthersbaugh, PA-C  azithromycin (ZITHROMAX) 500 MG tablet Take 2 tablets (1,000 mg total) by mouth once. Patient not taking: Reported on 10/30/2014 08/16/14   Harden Mo, MD  fluconazole (DIFLUCAN) 150 MG tablet Take 1 tablet (150 mg total) by mouth once. Patient not taking: Reported on 10/30/2014 06/26/14   Kristen N Ward, DO  fluconazole (DIFLUCAN) 150 MG tablet Take 1 tablet (150 mg total) by mouth once. Repeat in 1 week. Patient not taking: Reported on 10/30/2014 08/11/14   Billy Fischer, MD  HYDROcodone-acetaminophen (NORCO/VICODIN) 5-325 MG per tablet Take 1-2 tablets by mouth every 4 (four) hours as needed. 08/06/14   [name], MD  ibuprofen (ADVIL,MOTRIN) 800 MG tablet Take 1,800 mg by mouth every 8 (eight) hours as needed for moderate  pain.    Historical Provider, MD  metroNIDAZOLE (FLAGYL) 500 MG tablet Take 1 tablet (500 mg total) by mouth 2 (two) times daily. Patient not taking: Reported on 10/30/2014 06/26/14   Kristen N Ward, DO  metroNIDAZOLE (FLAGYL) 500 MG tablet Take 1 tablet (500 mg total) by mouth 2 (two) times daily. Patient not taking: Reported on 10/30/2014 08/11/14   Billy Fischer, MD  naproxen sodium (ANAPROX) 220 MG tablet Take 220 mg by mouth 2 (two) times daily as needed (pain).    Historical Provider, MD  terconazole (TERAZOL 3) 80 MG vaginal suppository Place 1 suppository (80 mg total) vaginally at bedtime. Patient not taking: Reported on 10/30/2014 08/11/14   Billy Fischer, MD   Physical Exam: Blood pressure 124/77, pulse 99, temperature 98.5 F (36.9 C), temperature source Oral, resp. rate 18, height 5\' 8"  (1.727 m), weight 91.173 kg (201 lb), last menstrual period 10/10/2014, SpO2 99 %. Filed Vitals:   11/01/14 0600  BP: 124/77  Pulse: 99  Temp: 98.5 F (36.9 C)  Resp: 18     General:  Appears calm and comfortable Eyes: PERRL, normal lids, irises & conjunctiva ENT: grossly normal hearing, lips & tongue Neck: no LAD, masses or thyromegaly Cardiovascular: RRR, no m/r/g. No LE edema. Telemetry: SR, no arrhythmias  Respiratory: CTA bilaterally, no w/r/r. Normal respiratory effort. Abdomen: soft, ntnd Skin: no rash or induration seen on limited exam Musculoskeletal: grossly normal tone BUE/BLE Psychiatric: grossly normal mood and affect, speech fluent and appropriate Neurologic: grossly non-focal.     Labs on Admission:  Basic Metabolic Panel:  Recent Labs Lab 10/31/14 2238  NA 140  K 6.4*  CL 112  CO2 20  GLUCOSE 83  BUN 5*  CREATININE 0.53  CALCIUM 7.4*   Liver Function Tests: No results for input(s): AST, ALT, ALKPHOS, BILITOT, PROT, ALBUMIN in the last 168 hours. No results for input(s): LIPASE, AMYLASE in the last 168 hours. No results for input(s): AMMONIA in the last 168  hours. CBC:  Recent Labs Lab 10/31/14 2238 11/01/14 0700  WBC 8.9 9.9  HGB 6.2* 7.7*  HCT 20.6* 26.4*  MCV 59.5* 59.5*  PLT 175 204   Cardiac Enzymes: No results for input(s): CKTOTAL, CKMB, CKMBINDEX, TROPONINI in the last 168 hours. BNP: Invalid input(s): POCBNP CBG: No results for input(s): GLUCAP in the last 168 hours.  Radiological Exams on Admission: No results found.  EKG: Independently reviewed. none  Time spent: 80 minutes  Charlynne Cousins [facility] Pager [phone]  If 7PM-7AM, please contact night-coverage [email or web address]  Password [email or web address] 11/01/2014, 8:33 AM

## 2014-11-01 NOTE — Anesthesia Postprocedure Evaluation (Signed)
  Anesthesia Post-op Note  Patient: Beth Gibson  Procedure(s) Performed: Procedure(s) (LRB): INCISION AND DRAINAGE RIGHT AXILLARY ABSCESS (Right)  Patient Location: PACU  Anesthesia Type: General  Level of Consciousness: awake and alert   Airway and Oxygen Therapy: Patient Spontanous Breathing  Post-op Pain: mild  Post-op Assessment: Post-op Vital signs reviewed, Patient's Cardiovascular Status Stable, Respiratory Function Stable, Patent Airway and No signs of Nausea or vomiting  Last Vitals:  Filed Vitals:   11/01/14 1311  BP: 133/70  Pulse: 100  Temp: 37.2 C  Resp: 16    Post-op Vital Signs: stable   Complications: No apparent anesthesia complications

## 2014-11-01 NOTE — Op Note (Signed)
10/31/2014 - 11/01/2014  1:06 PM  PATIENT:  Beth Gibson  28 y.o. female  No care team member to display  PRE-OPERATIVE DIAGNOSIS:  Right Axillary Abscess  POST-OPERATIVE DIAGNOSIS:  Right Axillary Abscess  PROCEDURE:  INCISION AND DRAINAGE RIGHT AXILLARY ABSCESS  SURGEON:  Surgeon(s): Leighton Ruff, MD  ASSISTANT: none   ANESTHESIA:   general  EBL: 38ml  SPECIMEN:  Source of Specimen:  cultures for microbiology  DISPOSITION OF SPECIMEN:  PATHOLOGY  COUNTS:  YES  PLAN OF CARE: patient admitted  PATIENT DISPOSITION:  PACU - hemodynamically stable.  INDICATION: R axillary abscess  Frequency of debridement: once Area of body debrided: R axilla Presence and extent of infected tissue: yes Presence and extent of non viable tissue: no  OR FINDINGS: Large abscess cavity   DESCRIPTION: The patient was identified in the pre op holding area and taken to the OR, where they were laid supine on the OR table.  General anesthesia was induced.  The operative area was prepped and draped in the usual sterile fashion.  A surgical time out was performed, indicating the correct patient, procedure, positioning and pre-operative antibiotics.   After this was completed, the wound was measured to be ~6x8cm.  The total area of devitalized tissue was minimal.  A suction instrument was used to debride the wound.  Debridement was carried down to the level of muscle.  Skin was removed.  There was purulent, infected material in the wound that would inhibit healing and or promote adjacent tissue breakdown.  At the end of the procedure the debrided area measured 8x8x6cm.  The wound was then packed with iodoform gauze and covered with a sterile dressing.  The patient was awakened from anesthesia and sent to the PACU in stable condition.  All counts were correct per OR staff.

## 2014-11-01 NOTE — Transfer of Care (Signed)
Immediate Anesthesia Transfer of Care Note  Patient: Beth Gibson  Procedure(s) Performed: Procedure(s): INCISION AND DRAINAGE RIGHT AXILLARY ABSCESS (Right)  Patient Location: PACU  Anesthesia Type:General  Level of Consciousness: awake  Airway & Oxygen Therapy: Patient Spontanous Breathing and Patient connected to face mask oxygen  Post-op Assessment: Report given to PACU RN and Post -op Vital signs reviewed and stable  Post vital signs: Reviewed and stable  Complications: No apparent anesthesia complications

## 2014-11-01 NOTE — Progress Notes (Signed)
Clarification of dressing change orders received via phone form [place], [place]. PA ordered to start dressing changes to right axillae in am. Supplies gathered as specified and made available.

## 2014-11-02 ENCOUNTER — Encounter (HOSPITAL_COMMUNITY): Payer: Self-pay | Admitting: General Surgery

## 2014-11-02 LAB — CBC
HCT: 25.9 % — ABNORMAL LOW (ref 36.0–46.0)
Hemoglobin: 7.6 g/dL — ABNORMAL LOW (ref 12.0–15.0)
MCH: 17.6 pg — AB (ref 26.0–34.0)
MCHC: 29.3 g/dL — ABNORMAL LOW (ref 30.0–36.0)
MCV: 60 fL — AB (ref 78.0–100.0)
Platelets: 255 10*3/uL (ref 150–400)
RBC: 4.32 MIL/uL (ref 3.87–5.11)
RDW: 19.3 % — AB (ref 11.5–15.5)
WBC: 7 10*3/uL (ref 4.0–10.5)

## 2014-11-02 MED ORDER — OXYCODONE HCL 5 MG PO TABS
5.0000 mg | ORAL_TABLET | ORAL | Status: DC | PRN
  Administered 2014-11-02: 5 mg via ORAL
  Administered 2014-11-03 (×2): 10 mg via ORAL
  Filled 2014-11-02: qty 1
  Filled 2014-11-02 (×2): qty 2

## 2014-11-02 MED ORDER — DIPHENHYDRAMINE HCL 25 MG PO CAPS
25.0000 mg | ORAL_CAPSULE | Freq: Four times a day (QID) | ORAL | Status: DC | PRN
  Administered 2014-11-02 (×2): 25 mg via ORAL
  Filled 2014-11-02 (×2): qty 1

## 2014-11-02 MED ORDER — ACETAMINOPHEN 325 MG PO TABS: 650.0000 mg | ORAL_TABLET | Freq: Four times a day (QID) | ORAL | Status: DC | PRN

## 2014-11-02 MED ORDER — OXYCODONE-ACETAMINOPHEN 5-325 MG PO TABS
1.0000 | ORAL_TABLET | ORAL | Status: DC | PRN
  Administered 2014-11-02: 2 via ORAL
  Filled 2014-11-02: qty 2

## 2014-11-02 NOTE — Plan of Care (Signed)
Problem: Phase I Progression Outcomes Goal: Pain controlled with appropriate interventions Outcome: Completed/Met Date Met:  11/02/14     

## 2014-11-02 NOTE — Progress Notes (Signed)
1 Day Post-Op  Subjective: Sore and tired.  We took out her packing and replaced it.  She is very tender, we gave her some extra dilaudid for the packing removal and repacking.     Objective: Vital signs in last 24 hours: Temp:  [97.5 F (36.4 C)-100.1 F (37.8 C)] 98.9 F (37.2 C) (11/13 0630) Pulse Rate:  [89-105] 92 (11/13 0630) Resp:  [13-18] 18 (11/13 0630) BP: (110-134)/(64-75) 121/68 mmHg (11/13 0630) SpO2:  [96 %-100 %] 99 % (11/13 0630) Last BM Date: 10/30/14 Diet:  regular Afebrile No labs   Intake/Output from previous day: 11/12 0701 - 11/13 0700 In: 2100 [P.O.:1200; I.V.:900] Out: 225 [Urine:200; Blood:25] Intake/Output this shift: Total I/O In: 240 [P.O.:240] Out: -   General appearance: alert, cooperative and no distress Resp: clear to auscultation bilaterally Skin:  We pulled the packing out, it was bloody, but she does not have allot of inflammation around the site it is just deep and painful.  We repacked with just a portion of what came out.  Lab Results:   Recent Labs  10/31/14 2238 11/01/14 0700  WBC 8.9 9.9  HGB 6.2* 7.7*  HCT 20.6* 26.4*  PLT 175 204    BMET  Recent Labs  10/31/14 2238 11/01/14 0815  NA 140 135*  K 6.4* 3.8  CL 112 100  CO2 20 23  GLUCOSE 83 104*  BUN 5* 7  CREATININE 0.53 0.70  CALCIUM 7.4* 9.0   PT/INR  Recent Labs  11/01/14 0815  LABPROT 15.3*  INR 1.20     Recent Labs Lab 11/01/14 0815  AST 14  ALT 10  ALKPHOS 49  BILITOT 0.4  PROT 8.1  ALBUMIN 3.2*     Lipase     Component Value Date/Time   LIPASE 22 10/05/2008 0935     Studies/Results: No results found.  Medications: . ferrous sulfate  325 mg Oral TID WC  . mupirocin ointment   Nasal BID  . piperacillin-tazobactam (ZOSYN)  IV  3.375 g Intravenous 3 times per day  . polyethylene glycol  17 g Oral Daily    Assessment/Plan Right Axillary Abscess INCISION AND DRAINAGE RIGHT AXILLARY ABSCESS, 13/24/4010,  Leighton Ruff,  MD. Microcytic anemia  Plan:  We will redress bid, i ask her to get her mother to come in so we could start training her to do this at home.  We have added iron, and will check on IV iron before discharge.  She works at [employer] and has children at home.  She does not have anyone except her children with her normally.      LOS: 2 days    Zeanna Sunde 11/02/2014

## 2014-11-02 NOTE — Plan of Care (Signed)
Problem: Phase I Progression Outcomes Goal: OOB as tolerated unless otherwise ordered Outcome: Completed/Met Date Met:  11/02/14

## 2014-11-02 NOTE — Plan of Care (Signed)
Problem: Phase I Progression Outcomes Goal: Voiding-avoid urinary catheter unless indicated Outcome: Completed/Met Date Met:  11/02/14     

## 2014-11-02 NOTE — Plan of Care (Signed)
Problem: Phase I Progression Outcomes Goal: Vital signs/hemodynamically stable Outcome: Completed/Met Date Met:  11/02/14

## 2014-11-02 NOTE — Progress Notes (Signed)
Patient ID: Beth Gibson, female   DOB: 02-Nov-1986, 28 y.o.   MRN: [id number]  [facility] PROGRESS NOTE  Elyshia Kumagai Truett UUV:[id number] DOB: 04/28/1986 DOA: 10/31/2014 PCP: No primary care provider on file.  Brief narrative: 28 year old female G1 P1, admitted by general surgery for management of axillary abscess, status post I&D on 10/31/2014, started on empiric antibiotics (Zosyn). Noted to be severely anemia and [facility] asked to consult.   Assessment and Plan:    Impression/Recommendations Microcytic anemia - MCV significantly low with elevated RDW, anemia panel c/w with severe Iron deficiency anemia - transfused 1 unit of packed red blood cells 11/12 with appropriate increase in post transfusion Hg - CBC pending this afternoon - Will need to go home on oral iron 3 times a day.  Axillary abscess - Surgery status post I&D on zosyn and started on 10/31/2014. - management per primary team - currently on zosyn, can change to PO ABX on discharge  Hyperkalemia - repeat BMP indicating normal K 11/12 - will repeat BMP in AM  DVT prophylaxis  SCD's  Code Status: Full Family Communication: Pt at bedside Disposition Plan: Home when medically stable  IV Access:   Peripheral IV Medical Consultants:   [facility] Other Consultants:   None  Anti-Infectives:   Anne Ng, MD  [place] Pager [phone]  If 7PM-7AM, please contact night-coverage [email or web address] Password [facility] 11/02/2014, 2:37 PM   LOS: 2 days   HPI/Subjective: No events overnight.   Objective: Filed Vitals:   11/01/14 2150 11/02/14 0155 11/02/14 0630 11/02/14 1331  BP: 125/68 110/68 121/68 105/58  Pulse: 92 93 92 66  Temp: 99.8 F (37.7 C) 98.5 F (36.9 C) 98.9 F (37.2 C) 98.1 F (36.7 C)  TempSrc: Oral Oral Oral Oral  Resp: 16 16 18 15   Height:      Weight:      SpO2: 99% 98% 99% 98%    Intake/Output Summary (Last 24 hours) at 11/02/14 1437 Last data filed at 11/02/14 1400  Gross per 24  hour  Intake   1740 ml  Output    200 ml  Net   1540 ml    Exam:   General:  Pt is alert, follows commands appropriately, not in acute distress  Cardiovascular: Regular rate and rhythm, S1/S2, no murmurs, no rubs, no gallops  Respiratory: Clear to auscultation bilaterally, no wheezing, no crackles, no rhonchi  Abdomen: Soft, non tender, non distended, bowel sounds present, no guarding  Data Reviewed: Basic Metabolic Panel:  Recent Labs Lab 10/31/14 2238 11/01/14 0815  NA 140 135*  K 6.4* 3.8  CL 112 100  CO2 20 23  GLUCOSE 83 104*  BUN 5* 7  CREATININE 0.53 0.70  CALCIUM 7.4* 9.0   Liver Function Tests:  Recent Labs Lab 11/01/14 0815  AST 14  ALT 10  ALKPHOS 49  BILITOT 0.4  PROT 8.1  ALBUMIN 3.2*   CBC:  Recent Labs Lab 10/31/14 2238 11/01/14 0700  WBC 8.9 9.9  HGB 6.2* 7.7*  HCT 20.6* 26.4*  MCV 59.5* 59.5*  PLT 175 204   Recent Results (from the past 240 hour(s))  Surgical pcr screen     Status: Abnormal   Collection Time: 11/01/14 12:23 AM  Result Value Ref Range Status   MRSA, PCR NEGATIVE NEGATIVE Final   Staphylococcus aureus POSITIVE (A) NEGATIVE Final    Comment:        The Xpert SA Assay (FDA approved for NASAL specimens in patients  over 38 years of age), is one component of a comprehensive surveillance program.  Test performance has been validated by [employer] for patients greater than or equal to 94 year old. It is not intended to diagnose infection nor to guide or monitor treatment.   Anaerobic culture     Status: None (Preliminary result)   Collection Time: 11/01/14 12:54 PM  Result Value Ref Range Status   Specimen Description ABSCESS RIGHT AXILLA  Final   Special Requests NONE  Final   Gram Stain   Final    ABUNDANT WBC PRESENT, PREDOMINANTLY PMN NO SQUAMOUS EPITHELIAL CELLS SEEN FEW GRAM POSITIVE COCCI IN PAIRS Performed at [facility]    Culture   Final    NO ANAEROBES ISOLATED; CULTURE IN PROGRESS  FOR 5 DAYS Performed at [facility]    Report Status PENDING  Incomplete  Culture, routine-abscess     Status: None (Preliminary result)   Collection Time: 11/01/14 12:54 PM  Result Value Ref Range Status   Specimen Description ABSCESS Axilla, Righ  Final   Special Requests NONE  Final   Gram Stain   Final    ABUNDANT WBC PRESENT, PREDOMINANTLY PMN NO SQUAMOUS EPITHELIAL CELLS SEEN FEW GRAM POSITIVE COCCI IN PAIRS Performed at [facility]    Culture   Final    Culture reincubated for better growth Performed at [facility]    Report Status PENDING  Incomplete     Scheduled Meds: . ferrous sulfate  325 mg Oral TID WC  . mupirocin ointment   Nasal BID  . ZOSYN  IV  3.375 g Intravenous 3 times per day  . polyethylene glycol  17 g Oral Daily   Continuous Infusions:

## 2014-11-02 NOTE — Care Management Note (Signed)
    Page 1 of 1   11/03/2014     2:57:21 PM CARE MANAGEMENT NOTE 11/03/2014  Patient:  KRISI, AZUA   Account Number:  [id number]  Date Initiated:  11/02/2014  Documentation initiated by:  Sunday Spillers  Subjective/Objective Assessment:   28 yo female admitted s/p I&D of axillary abscess. PTA lived at home with mother.     Action/Plan:   Home when stable   Anticipated DC Date:  11/03/2014   Anticipated DC Plan:  [place]  CM consult      [facility] Choice  HOME HEALTH   Choice offered to / List presented to:  C-1 Patient        [place] arranged  HH-1 RN  [facility].   Status of service:  Completed, signed off Medicare Important Message given?   (If response is "NO", the following Medicare IM given date fields will be blank) Date Medicare IM given:   Medicare IM given by:   Date Additional Medicare IM given:   Additional Medicare IM given by:    Discharge Disposition:  [place]  Per UR Regulation:  Reviewed for med. necessity/level of care/duration of stay  If discussed at [place] of Stay Meetings, dates discussed:    Comments:  11/03/14 Tiny Rietz RN,BSN NCM [email or web address].TC [facility] AWARE OF ORDER, & D/C.  11-02-14 Sunday Spillers RN CM [address] [facility], will follow for d/c needs.

## 2014-11-03 ENCOUNTER — Encounter (INDEPENDENT_AMBULATORY_CARE_PROVIDER_SITE_OTHER): Payer: Self-pay | Admitting: General Surgery

## 2014-11-03 LAB — BASIC METABOLIC PANEL
Anion gap: 9 (ref 5–15)
BUN: 6 mg/dL (ref 6–23)
CO2: 28 mEq/L (ref 19–32)
Calcium: 9.1 mg/dL (ref 8.4–10.5)
Chloride: 100 mEq/L (ref 96–112)
Creatinine, Ser: 0.74 mg/dL (ref 0.50–1.10)
GFR calc non Af Amer: >90 mL/min (ref >90)
Glucose, Bld: 120 mg/dL — ABNORMAL HIGH (ref 70–99)
POTASSIUM: 4.1 meq/L (ref 3.7–5.3)
SODIUM: 137 meq/L (ref 137–147)

## 2014-11-03 LAB — CBC
HCT: 23.2 % — ABNORMAL LOW (ref 36.0–46.0)
HEMOGLOBIN: 6.7 g/dL — AB (ref 12.0–15.0)
MCH: 17.4 pg — ABNORMAL LOW (ref 26.0–34.0)
MCHC: 28.9 g/dL — ABNORMAL LOW (ref 30.0–36.0)
MCV: 60.4 fL — ABNORMAL LOW (ref 78.0–100.0)
Platelets: 318 10*3/uL (ref 150–400)
RBC: 3.84 MIL/uL — ABNORMAL LOW (ref 3.87–5.11)
RDW: 19.1 % — AB (ref 11.5–15.5)
WBC: 5.5 10*3/uL (ref 4.0–10.5)

## 2014-11-03 MED ORDER — OXYCODONE-ACETAMINOPHEN 10-325 MG PO TABS: 1.0000 | ORAL_TABLET | Freq: Four times a day (QID) | ORAL | Status: DC | PRN

## 2014-11-03 MED ORDER — FERROUS SULFATE 325 (65 FE) MG PO TABS: 325.0000 mg | ORAL_TABLET | Freq: Three times a day (TID) | ORAL | Status: DC

## 2014-11-03 NOTE — Progress Notes (Signed)
2 Days Post-Op  Subjective: Wants to go home  Objective: Vital signs in last 24 hours: Temp:  [98.1 F (36.7 C)-98.6 F (37 C)] 98.6 F (37 C) (11/14 0602) Pulse Rate:  [66-71] 71 (11/14 0602) Resp:  [15-16] 16 (11/14 0602) BP: (105-115)/(56-58) 115/56 mmHg (11/14 0602) SpO2:  [98 %-100 %] 100 % (11/14 0602) Last BM Date: 10/30/14  Intake/Output from previous day: 11/13 0701 - 11/14 0700 In: 1020 [P.O.:720; IV Piggyback:300] Out: -  Intake/Output this shift:    Axilla with dressing in place, no surrounding erythema   Lab Results:   Recent Labs  11/02/14 1515 11/03/14 0528  WBC 7.0 5.5  HGB 7.6* 6.7*  HCT 25.9* 23.2*  PLT 255 318   BMET  Recent Labs  11/01/14 0815 11/03/14 0528  NA 135* 137  K 3.8 4.1  CL 100 100  CO2 23 28  GLUCOSE 104* 120*  BUN 7 6  CREATININE 0.70 0.74  CALCIUM 9.0 9.1   PT/INR  Recent Labs  11/01/14 0815  LABPROT 15.3*  INR 1.20   ABG No results for input(s): PHART, HCO3 in the last 72 hours.  Invalid input(s): PCO2, PO2  Studies/Results: No results found.  Anti-infectives: Anti-infectives    Start     Dose/Rate Route Frequency Ordered Stop   10/31/14 2000  piperacillin-tazobactam (ZOSYN) IVPB 3.375 g     3.375 g12.5 mL/hr over 240 Minutes Intravenous 3 times per day 10/31/14 1816        Assessment/Plan: POD 2 I/D axillary abscess  Dc home today  F/u office Anemia longstanding, will send home tid iron   Beth Gibson 11/03/2014

## 2014-11-03 NOTE — Discharge Instructions (Signed)
You should follow up with primary care provider soon for anemia  Dressing Change A dressing is a material placed over wounds. It keeps the wound clean, dry, and protected from further injury. This provides an environment that favors wound healing.  BEFORE YOU BEGIN  Get your supplies together. Things you may need include:  Saline solution.  Flexible gauze dressing.  Medicated cream.  Tape.  Gloves.  Abdominal dressing pads.  Gauze squares.  Plastic bags.  Take pain medicine 30 minutes before the dressing change if you need it.  Take a shower before you do the first dressing change of the day. Use plastic wrap or a plastic bag to prevent the dressing from getting wet. REMOVING YOUR OLD DRESSING   Wash your hands with soap and water. Dry your hands with a clean towel.  Put on your gloves.  Remove any tape.  Carefully remove the old dressing. If the dressing sticks, you may dampen it with warm water to loosen it, or follow your caregiver's specific directions.  Remove any gauze or packing tape that is in your wound.  Take off your gloves.  Put the gloves, tape, gauze, or any packing tape into a plastic bag. CHANGING YOUR DRESSING  Open the supplies.  Take the cap off the saline solution.  Open the gauze package so that the gauze remains on the inside of the package.  Put on your gloves.  Clean your wound as told by your caregiver.  If you have been told to keep your wound dry, follow those instructions.  Your caregiver may tell you to do one or more of the following:  Pick up the gauze. Pour the saline solution over the gauze. Squeeze out the extra saline solution.  Put medicated cream or other medicine on your wound if you have been told to do so.  Put the solution soaked gauze only in your wound, not on the skin around it.  Pack your wound loosely or as told by your caregiver.  Put dry gauze on your wound.  Put abdominal dressing pads over the dry  gauze if your wet gauze soaks through.  Tape the abdominal dressing pads in place so they will not fall off. Do not wrap the tape completely around the affected part (arm, leg, abdomen).  Wrap the dressing pads with a flexible gauze dressing to secure it in place.  Take off your gloves. Put them in the plastic bag with the old dressing. Tie the bag shut and throw it away.  Keep the dressing clean and dry until your next dressing change.  Wash your hands. SEEK MEDICAL CARE IF:  Your skin around the wound looks red.  Your wound feels more tender or sore.  You see pus in the wound.  Your wound smells bad.  You have a fever.  Your skin around the wound has a rash that itches and burns.  You see black or yellow skin in your wound that was not there before.  You feel nauseous, throw up, and feel very tired. Document Released: 01/14/2005 Document Revised: 02/29/2012 Document Reviewed: 10/19/2011 [facility] Patient Information 2015 [place], [place]. This information is not intended to replace advice given to you by your health care provider. Make sure you discuss any questions you have with your health care provider.     Anemia, Nonspecific Anemia is a condition in which the concentration of red blood cells or hemoglobin in the blood is below normal. Hemoglobin is a substance in red blood cells  that carries oxygen to the tissues of the body. Anemia results in not enough oxygen reaching these tissues.  CAUSES  Common causes of anemia include:   Excessive bleeding. Bleeding may be internal or external. This includes excessive bleeding from periods (in women) or from the intestine.   Poor nutrition.   Chronic kidney, thyroid, and liver disease.  Bone marrow disorders that decrease red blood cell production.  Cancer and treatments for cancer.  HIV, AIDS, and their treatments.  Spleen problems that increase red blood cell destruction.  Blood disorders.  Excess destruction of  red blood cells due to infection, medicines, and autoimmune disorders. SIGNS AND SYMPTOMS   Minor weakness.   Dizziness.   Headache.  Palpitations.   Shortness of breath, especially with exercise.   Paleness.  Cold sensitivity.  Indigestion.  Nausea.  Difficulty sleeping.  Difficulty concentrating. Symptoms may occur suddenly or they may develop slowly.  DIAGNOSIS  Additional blood tests are often needed. These help your health care provider determine the best treatment. Your health care provider will check your stool for blood and look for other causes of blood loss.  TREATMENT  Treatment varies depending on the cause of the anemia. Treatment can include:   Supplements of iron, vitamin K99, or folic acid.   Hormone medicines.   A blood transfusion. This may be needed if blood loss is severe.   Hospitalization. This may be needed if there is significant continual blood loss.   Dietary changes.  Spleen removal. HOME CARE INSTRUCTIONS Keep all follow-up appointments. It often takes many weeks to correct anemia, and having your health care provider check on your condition and your response to treatment is very important. SEEK IMMEDIATE MEDICAL CARE IF:   You develop extreme weakness, shortness of breath, or chest pain.   You become dizzy or have trouble concentrating.  You develop heavy vaginal bleeding.   You develop a rash.   You have bloody or black, tarry stools.   You faint.   You vomit up blood.   You vomit repeatedly.   You have abdominal pain.  You have a fever or persistent symptoms for more than 2-3 days.   You have a fever and your symptoms suddenly get worse.   You are dehydrated.  MAKE SURE YOU:  Understand these instructions.  Will watch your condition.  Will get help right away if you are not doing well or get worse. Document Released: 01/14/2005 Document Revised: 08/09/2013 Document Reviewed:  06/02/2013 [facility] Patient Information 2015 [place], [place]. This information is not intended to replace advice given to you by your health care provider. Make sure you discuss any questions you have with your health care provider.

## 2014-11-03 NOTE — Plan of Care (Signed)
Problem: Phase II Progression Outcomes Goal: Pain controlled Outcome: Completed/Met Date Met:  11/03/14 Goal: Progress activity as tolerated unless otherwise ordered Outcome: Completed/Met Date Met:  11/03/14 Goal: Vital signs stable Outcome: Completed/Met Date Met:  11/03/14 Goal: Surgical site without signs of infection Outcome: Completed/Met Date Met:  11/03/14 Goal: Sutures/staples intact Outcome: Not Applicable Date Met:  44/97/53

## 2014-11-03 NOTE — Progress Notes (Signed)
Pt stable for d/c home from IM standpoint. Pt denies shortness of breath, chest pain. Understands she needs Iron supplementation upon discharge.   Faye Ramsay, MD  [facility] Pager [phone]  If 7PM-7AM, please contact night-coverage [email or web address] Password [email or web address]

## 2014-11-03 NOTE — Progress Notes (Signed)
CRITICAL VALUE ALERT  Critical value received: Hemoglobin 6.7     Date of notification: 11/03/2014 Time of notification:  0600 Critical value read back:Yes.    Nurse who received alert:  Delano Metz  MD notified (1st page):  [place],MD  Time of first page:  0600  MD notified (2nd page):  Time of second page:  Responding MD:  Lysbeth Penner  Time MD responded:  0600

## 2014-11-03 NOTE — Progress Notes (Signed)
Assessment unchanged. Pt and mother verbalized understanding of dc instructions through teach back. Mother verbalized and demonstrated understanding of dressing change to be done twice daily. [facility] to follow pt at home. Script x 1 given as provided by MD as well as dressing change supplies. Discharged via wc to front entrance to meet awaiting vehicle to carry home. Accompanied by mother and NT.

## 2014-11-05 LAB — CULTURE, ROUTINE-ABSCESS

## 2014-11-06 LAB — ANAEROBIC CULTURE

## 2014-11-09 NOTE — Discharge Summary (Signed)
Physician Discharge Summary  Patient ID: Beth Gibson MRN: [id number] DOB/AGE: 1986/09/19 28 y.o.  Admit date: 10/31/2014 Discharge date: 11/03/2014  Admission Diagnoses:  Right Axillary Abscess Microcytic anemia  Discharge Diagnoses:  Right Axillary Abscess Microcytic anemia  Principal Problem:   Axillary abscess Active Problems:   Microcytic anemia   Obesity (BMI 30-39.9)   PROCEDURES: INCISION AND DRAINAGE RIGHT AXILLARY ABSCESS, 42/70/6237, Leighton Ruff, MD.   Hospital Course:  The patient is a 28 year old female who presents with a subcutaneous abscess. This is a 28 yo female who presents with several days of worsening pain and swelling under her right axilla. She presented to the ED yesterday for evaluation where a bedside ultrasound revealed a large fluid collection. A bedside incision and drainage was attempted but very little drainage was encountered. The patient did not tolerate the bedside procedure very well. She states that it feels more swollen and tender today. She presents to Urgent Office for evaluation. She states that she has been NPO except for ice chips since 10 AM.  She was admitted directly to the hospital from the office.  She was placed on antibiotics and taken to the OR the next day.  Her packing was removed the first post op morning with IV narcotics for pain control.  She really did well.  We had her family come in and learn how to do the dressing change.  We continued the IV antibiotics and pain control.  She was ready for discharge the following day She was found to have significant anemia and she was seen by Medicine.  She was started on Iron. I spoke to her about following up with her PCP.    Condition on D/C:    CBC Latest Ref Rng 11/03/2014 11/02/2014 11/01/2014  WBC 4.0 - 10.5 K/uL 5.5 7.0 9.9  Hemoglobin 12.0 - 15.0 g/dL 6.7(LL) 7.6(L) 7.7(L)  Hematocrit 36.0 - 46.0 % 23.2(L) 25.9(L) 26.4(L)  Platelets 150 - 400 K/uL 318 255 204   CMP  Latest Ref Rng 11/03/2014 11/01/2014 10/31/2014  Glucose 70 - 99 mg/dL 120(H) 104(H) 83  BUN 6 - 23 mg/dL 6 7 5(L)  Creatinine 0.50 - 1.10 mg/dL 0.74 0.70 0.53  Sodium 137 - 147 mEq/L 137 135(L) 140  Potassium 3.7 - 5.3 mEq/L 4.1 3.8 6.4(H)  Chloride 96 - 112 mEq/L 100 100 112  CO2 19 - 32 mEq/L 28 23 20   Calcium 8.4 - 10.5 mg/dL 9.1 9.0 7.4(L)  Total Protein 6.0 - 8.3 g/dL - 8.1 -  Total Bilirubin 0.3 - 1.2 mg/dL - 0.4 -  Alkaline Phos 39 - 117 U/L - 49 -  AST 0 - 37 U/L - 14 -  ALT 0 - 35 U/L - 10 -   Disposition: 01-Home or Self Care     Medication List    STOP taking these medications        azithromycin 500 MG tablet  Commonly known as:  ZITHROMAX     clindamycin 150 MG capsule  Commonly known as:  CLEOCIN     fluconazole 150 MG tablet  Commonly known as:  DIFLUCAN     HYDROcodone-acetaminophen 5-325 MG per tablet  Commonly known as:  NORCO/VICODIN     metroNIDAZOLE 500 MG tablet  Commonly known as:  FLAGYL     naproxen sodium 220 MG tablet  Commonly known as:  ANAPROX      TAKE these medications        acetaminophen 500 MG tablet  Commonly known as:  TYLENOL  Take 1,000 mg by mouth every 6 (six) hours as needed for moderate pain (pain).     ferrous sulfate 325 (65 FE) MG tablet  Take 1 tablet (325 mg total) by mouth 3 (three) times daily with meals.     ibuprofen 800 MG tablet  Commonly known as:  ADVIL,MOTRIN  Take 1,800 mg by mouth every 8 (eight) hours as needed for moderate pain.     oxyCODONE-acetaminophen 10-325 MG per tablet  Commonly known as:  PERCOCET  Take 1 tablet by mouth every 6 (six) hours as needed for pain.     terconazole 80 MG vaginal suppository  Commonly known as:  TERAZOL 3  Place 1 suppository (80 mg total) vaginally at bedtime.       Follow-up Information    Follow up with Rosario Adie., MD In 1 week.   Specialty:  General Surgery   Contact information:   [address] [place]  [address] [phone]       Signed: Earnstine Regal 11/09/2014, 2:00 PM

## 2014-11-26 ENCOUNTER — Emergency Department (HOSPITAL_COMMUNITY)
Admission: EM | Admit: 2014-11-26 | Discharge: 2014-11-26 | Disposition: A | Discharge status: Home / Self Care | Payer: No Typology Code available for payment source | Attending: Emergency Medicine | Admitting: Emergency Medicine

## 2014-11-26 ENCOUNTER — Encounter (HOSPITAL_COMMUNITY): Payer: Self-pay | Admitting: Family Medicine

## 2014-11-26 DIAGNOSIS — Z79899 Other long term (current) drug therapy: Secondary | ICD-10-CM | POA: Insufficient documentation

## 2014-11-26 DIAGNOSIS — S41101A Unspecified open wound of right upper arm, initial encounter: Secondary | ICD-10-CM

## 2014-11-26 DIAGNOSIS — M79621 Pain in right upper arm: Secondary | ICD-10-CM

## 2014-11-26 DIAGNOSIS — T814XXA Infection following a procedure, initial encounter: Secondary | ICD-10-CM | POA: Insufficient documentation

## 2014-11-26 DIAGNOSIS — Y838 Other surgical procedures as the cause of abnormal reaction of the patient, or of later complication, without mention of misadventure at the time of the procedure: Secondary | ICD-10-CM | POA: Insufficient documentation

## 2014-11-26 DIAGNOSIS — Z8742 Personal history of other diseases of the female genital tract: Secondary | ICD-10-CM | POA: Insufficient documentation

## 2014-11-26 DIAGNOSIS — X58XXXA Exposure to other specified factors, initial encounter: Secondary | ICD-10-CM | POA: Insufficient documentation

## 2014-11-26 DIAGNOSIS — Z8619 Personal history of other infectious and parasitic diseases: Secondary | ICD-10-CM | POA: Insufficient documentation

## 2014-11-26 DIAGNOSIS — Z87440 Personal history of urinary (tract) infections: Secondary | ICD-10-CM | POA: Insufficient documentation

## 2014-11-26 DIAGNOSIS — Z872 Personal history of diseases of the skin and subcutaneous tissue: Secondary | ICD-10-CM | POA: Insufficient documentation

## 2014-11-26 MED ORDER — OXYCODONE-ACETAMINOPHEN 5-325 MG PO TABS: 1.0000 | ORAL_TABLET | Freq: Four times a day (QID) | ORAL | Status: DC | PRN

## 2014-11-26 NOTE — ED Provider Notes (Signed)
CSN: [id number]     Arrival date & time 11/26/14  1219 History   First MD Initiated Contact with Patient 11/26/14 1514     Chief Complaint  Patient presents with  . Arm Pain     (Consider location/radiation/quality/duration/timing/severity/associated sxs/prior Treatment) HPI Comments: Beth Gibson is a 28 y.o. female with a PMHx of R axilla abscess and cyst s/p cyst removal 1 week ago, who presents to the ED with complaints of R axilla pain that worsened last night. She states that after her surgery, she has had some mild pain, but that since returning to work she had more pain last night. Pain is 10/10 pressure-like, constant, nonradiating, located at the area of her incision, worse with arm movement, and improved with heat. She tried ibuprofen and tylenol last night without relief. She was previously on Percocet after her surgery but she ran out. She has a wound nurse that had been coming out and helping her with dressing changes, and she has been continuing these dressing changes daily. She noted some minimal amount of bleeding this morning, which had occurred over night, and stopped before she awoke. She states the dressing was not saturated, but it had bleed into the dressing. Since then she has not had any bleeding. She has not noted any worsening redness, warmth, or drainage. Denies increased swelling. She denies fevers, chills, CP, SOB, abd pain, N/V/D/C, hematuria, dysuria, myalgias, arthralgias, weakness, paresthesias, or rashes. Surgeon's office is [facility]. She has left a message on their nursing line for an appt.    Patient is a 28 y.o. female presenting with wound check. The history is provided by the patient. No language interpreter was used.  Wound Check This is a new problem. The current episode started yesterday. The problem occurs constantly. The problem has been unchanged. Pertinent negatives include no abdominal pain, arthralgias, chest pain, chills, fever, headaches, joint swelling,  myalgias, nausea, neck pain, numbness, rash, swollen glands, vomiting or weakness. Exacerbated by: movement. She has tried heat, acetaminophen and NSAIDs for the symptoms. The treatment provided mild relief.    Past Medical History  Diagnosis Date  . Chlamydia   . Trichomonas   . Preterm labor   . Headache(784.0)   . UTI (lower urinary tract infection)   . BV (bacterial vaginosis)   . Yeast vaginitis   . Abscess of axilla, right    Past Surgical History  Procedure Laterality Date  . Cyst removed from l buttock    . Cesarean section    . Wisdom tooth extraction    . Wisdom tooth extraction  2009  . Cystectomy  2009    left buttock  . Cesarean section  04/18/2012    Procedure: CESAREAN SECTION;  Surgeon: Guss Bunde, MD;  Location: [facility];  Service: Gynecology;  Laterality: N/A;  repeat  . Incision and drainage abscess Right 11/01/2014    Procedure: INCISION AND DRAINAGE RIGHT AXILLARY ABSCESS;  Surgeon: Leighton Ruff, MD;  Location: [facility];  Service: General;  Laterality: Right;   Family History  Problem Relation Age of Onset  . Hypertension Mother   . Diabetes Mellitus I Father    History  Substance Use Topics  . Smoking status: Never Smoker   . Smokeless tobacco: Never Used  . Alcohol Use: 0.5 oz/week    1 Not specified per week     Comment: "special occasions"   OB History    Gravida Para Term Preterm AB TAB SAB Ectopic Multiple Living  3 2 0 2 1 0 1 0 0 2      Review of Systems  Constitutional: Negative for fever and chills.  Respiratory: Negative for shortness of breath.   Cardiovascular: Negative for chest pain.  Gastrointestinal: Negative for nausea, vomiting and abdominal pain.  Musculoskeletal: Negative for myalgias, joint swelling, arthralgias and neck pain.  Skin: Positive for wound (surgery). Negative for rash.  Neurological: Negative for weakness, numbness and headaches.  Hematological: Negative for adenopathy.   10 Systems reviewed and are  negative for acute change except as noted in the HPI.    Allergies  Vicodin  Home Medications   Prior to Admission medications   Medication Sig Start Date End Date Taking? Authorizing Provider  acetaminophen (TYLENOL) 500 MG tablet Take 1,000 mg by mouth every 6 (six) hours as needed for moderate pain (pain).     Historical Provider, MD  ferrous sulfate 325 (65 FE) MG tablet Take 1 tablet (325 mg total) by mouth 3 (three) times daily with meals. 11/03/14   Rolm Bookbinder, MD  ibuprofen (ADVIL,MOTRIN) 800 MG tablet Take 1,800 mg by mouth every 8 (eight) hours as needed for moderate pain.    Historical Provider, MD  oxyCODONE-acetaminophen (PERCOCET) 10-325 MG per tablet Take 1 tablet by mouth every 6 (six) hours as needed for pain. 11/03/14 11/03/15  Rolm Bookbinder, MD  terconazole (TERAZOL 3) 80 MG vaginal suppository Place 1 suppository (80 mg total) vaginally at bedtime. Patient not taking: Reported on 10/30/2014 08/11/14   Billy Fischer, MD   BP 132/78 mmHg  Pulse 89  Temp(Src) 97.5 F (36.4 C) (Oral)  Resp 16  Ht 5\' 8"  (1.727 m)  Wt 208 lb 1 oz (94.377 kg)  BMI 31.64 kg/m2  SpO2 100%  LMP 10/10/2014 Physical Exam  Constitutional: She is oriented to person, place, and time. Vital signs are normal. She appears well-developed and well-nourished.  Non-toxic appearance. No distress.  Afebrile, nontoxic, NAD  HENT:  Head: Normocephalic and atraumatic.  Mouth/Throat: Oropharynx is clear and moist and mucous membranes are normal.  Eyes: Conjunctivae and EOM are normal. Right eye exhibits no discharge. Left eye exhibits no discharge.  Neck: Normal range of motion. Neck supple.  Cardiovascular: Normal rate and intact distal pulses.   Distal pulses intact  Pulmonary/Chest: Effort normal. No respiratory distress. Right breast exhibits no mass and no tenderness. Left breast exhibits no mass and no tenderness.  No breast masses or TTP  Abdominal: Normal appearance. She exhibits no  distension.  Musculoskeletal: Normal range of motion.  ROM of R shoulder slightly limited due to pain, only with overhead movements. Distal pulses intact, all joints nonTTP without crepitus or deformities. Strength 5/5 in all extremities, sensation grossly intact in all extremities.   Lymphadenopathy:    She has no axillary adenopathy.  No axillary LAD  Neurological: She is alert and oriented to person, place, and time. She has normal strength. No sensory deficit.  Skin: Skin is warm and dry. No rash noted. No erythema.  2cm surgical incision, well healing, under R axilla, with mild induration surrounding the area and mild TTP. No warmth or erythema, no drainage, no bleeding. No fluctuance or surrounding cellulitis.   Psychiatric: She has a normal mood and affect.  Nursing note and vitals reviewed.   ED Course  Procedures (including critical care time) Labs Review Labs Reviewed - No data to display  Imaging Review No results found.   EKG Interpretation None      MDM  Final diagnoses:  Wound, open axilla, right, initial encounter  Axillary pain, right    28y/o female with recent cyst removal under R axilla, now having slightly worsened pain. Recently went back to work and believes she overexerted the area. No erythema or warmth, no drainage, does not appear to have cellulitis or abscess. Has f/up with her surgeon. Pt currently on abx. Discussed percocet for pain, none given here due to pt driving. Doubt need for emergent surgical consult. Will have her see her surgeon tomorrow or ASAP. Discussed return precautions. I explained the diagnosis and have given explicit precautions to return to the ER including for any other new or worsening symptoms. The patient understands and accepts the medical plan as it's been dictated and I have answered their questions. Discharge instructions concerning home care and prescriptions have been given. The patient is STABLE and is discharged to home in  good condition.  BP 121/79 mmHg  Pulse 84  Temp(Src) 97.5 F (36.4 C) (Oral)  Resp 13  Ht 5\' 8"  (1.727 m)  Wt 208 lb 1 oz (94.377 kg)  BMI 31.64 kg/m2  SpO2 100%  LMP 10/10/2014  Meds ordered this encounter  Medications  . oxyCODONE-acetaminophen (PERCOCET) 5-325 MG per tablet    Sig: Take 1-2 tablets by mouth every 6 (six) hours as needed for severe pain.    Dispense:  10 tablet    Refill:  0    Order Specific Question:  Supervising Provider    Answer:  Johnna Acosta [address] [place], PA-C 60/63/01 [id number]  Delora Fuel, MD 93/23/55 [id number]

## 2014-11-26 NOTE — Discharge Instructions (Signed)
Keep wound and clean with mild soap and water. Keep area covered as directed by your surgeon, keep bandage dry. Use ice or heat for additional pain relief and swelling. Alternate between Ibuprofen and Tylenol for additional pain relief. Use percocet for breakthrough pain but avoid taking more than 3,000mg  of tylenol daily (percocet tablet contains 325mg  each). Do not drive while taking percocet. Follow up with your surgeon today or tomorrow for wound recheck. Monitor area for signs of infection to include, but not limited to: increasing pain, redness, drainage/pus, or swelling. Return to emergency department for emergent changing or worsening symptoms.    Wound Care Wound care helps prevent pain and infection.  You may need a tetanus shot if:  You cannot remember when you had your last tetanus shot.  You have never had a tetanus shot.  The injury broke your skin. If you need a tetanus shot and you choose not to have one, you may get tetanus. Sickness from tetanus can be serious. HOME CARE   Only take medicine as told by your doctor.  Clean the wound daily with mild soap and water.  Change any bandages (dressings) as told by your doctor.  Put medicated cream and a bandage on the wound as told by your doctor.  Change the bandage if it gets wet, dirty, or starts to smell.  Take showers. Do not take baths, swim, or do anything that puts your wound under water.  Rest and raise (elevate) the wound until the pain and puffiness (swelling) are better.  Keep all doctor visits as told. GET HELP RIGHT AWAY IF:   Yellowish-white fluid (pus) comes from the wound.  Medicine does not lessen your pain.  There is a red streak going away from the wound.  You have a fever. MAKE SURE YOU:   Understand these instructions.  Will watch your condition.  Will get help right away if you are not doing well or get worse. Document Released: 09/15/2008 Document Revised: 02/29/2012 Document Reviewed:  04/12/2011 [facility] Patient Information 2015 [place], [place]. This information is not intended to replace advice given to you by your health care provider. Make sure you discuss any questions you have with your health care provider.

## 2014-11-26 NOTE — ED Notes (Signed)
Per pt sts right arm pain and neck pain. sts recently returned to work after surgery. sts started last night. sts some bloody drainage under arm. sts she had a cyst I&D.

## 2014-12-14 LAB — AFB CULTURE WITH SMEAR (NOT AT ARMC): ACID FAST SMEAR: NONE SEEN

## 2015-01-04 ENCOUNTER — Emergency Department (HOSPITAL_COMMUNITY)
Admission: EM | Admit: 2015-01-04 | Discharge: 2015-01-04 | Disposition: A | Discharge status: Home / Self Care | Payer: No Typology Code available for payment source | Attending: Emergency Medicine | Admitting: Emergency Medicine

## 2015-01-04 ENCOUNTER — Encounter (HOSPITAL_COMMUNITY): Payer: Self-pay | Admitting: Emergency Medicine

## 2015-01-04 DIAGNOSIS — K002 Abnormalities of size and form of teeth: Secondary | ICD-10-CM | POA: Insufficient documentation

## 2015-01-04 DIAGNOSIS — K029 Dental caries, unspecified: Secondary | ICD-10-CM | POA: Insufficient documentation

## 2015-01-04 DIAGNOSIS — K0381 Cracked tooth: Secondary | ICD-10-CM | POA: Insufficient documentation

## 2015-01-04 DIAGNOSIS — K0889 Other specified disorders of teeth and supporting structures: Secondary | ICD-10-CM

## 2015-01-04 DIAGNOSIS — Z87440 Personal history of urinary (tract) infections: Secondary | ICD-10-CM | POA: Insufficient documentation

## 2015-01-04 DIAGNOSIS — Z8742 Personal history of other diseases of the female genital tract: Secondary | ICD-10-CM | POA: Insufficient documentation

## 2015-01-04 DIAGNOSIS — Z872 Personal history of diseases of the skin and subcutaneous tissue: Secondary | ICD-10-CM | POA: Insufficient documentation

## 2015-01-04 DIAGNOSIS — Z79899 Other long term (current) drug therapy: Secondary | ICD-10-CM | POA: Insufficient documentation

## 2015-01-04 DIAGNOSIS — M542 Cervicalgia: Secondary | ICD-10-CM | POA: Insufficient documentation

## 2015-01-04 DIAGNOSIS — Z8619 Personal history of other infectious and parasitic diseases: Secondary | ICD-10-CM | POA: Insufficient documentation

## 2015-01-04 MED ORDER — OXYCODONE-ACETAMINOPHEN 5-325 MG PO TABS: 1.0000 | ORAL_TABLET | Freq: Four times a day (QID) | ORAL | Status: DC | PRN

## 2015-01-04 MED ORDER — NAPROXEN 500 MG PO TABS: 500.0000 mg | ORAL_TABLET | Freq: Two times a day (BID) | ORAL | Status: DC | PRN

## 2015-01-04 MED ORDER — DOXYCYCLINE HYCLATE 100 MG PO CAPS: 100.0000 mg | ORAL_CAPSULE | Freq: Two times a day (BID) | ORAL | Status: DC

## 2015-01-04 MED ORDER — OXYCODONE-ACETAMINOPHEN 5-325 MG PO TABS
1.0000 | ORAL_TABLET | Freq: Once | ORAL | Status: AC
  Administered 2015-01-04: 1 via ORAL
  Filled 2015-01-04: qty 1

## 2015-01-04 NOTE — ED Notes (Signed)
the patient has been having pain in her bottom left back tooth.  She say she tried to make an appointment but they told her she would not be able to be seen until the end of this month.  She says she has a hole in it and it is throbbing.  She rates her pain 10/10.

## 2015-01-04 NOTE — ED Provider Notes (Signed)
CSN: [id number]     Arrival date & time 01/04/15  1324 History   First MD Initiated Contact with Patient 01/04/15 [phone]     Chief Complaint  Patient presents with  . Dental Pain    the patient has been having pain in her bottom left back tooth.  She say she tried to make an appointment but they told her she would not be able to be seen until the end of this month.     (Consider location/radiation/quality/duration/timing/severity/associated sxs/prior Treatment) HPI Comments: Beth Gibson is a 29 y.o. female with a PMHx of headaches, who presents to the ED with complaints of left lower dental pain in tooth # 18 1.5 weeks after a broken tooth/filling falling out. She  Reports the pain as 10/10 "shocking" intermittent and radiates into her neck, worse with pressure to the tooth and talking, unrelieved by Tylenol, heat, Orajel, BC powders, and ibuprofen. She states that she called her dentist, and cannot get into see him until the end of January. She denies any facial swelling, trismus, drooling,  gum swelling, gum bleeding or drainage, fevers, chills, ear drainage or pain, URI symptoms, chest pain, shortness of breath, abdominal pain, nausea, or vomiting.   Patient is a 29 y.o. female presenting with tooth pain. The history is provided by the patient. No language interpreter was used.  Dental Pain Location:  Lower Lower teeth location:  18/LL 2nd molar Quality:  Pulsating ("shocking") Severity:  Severe (10/10) Onset quality:  Gradual Duration:  2 weeks Timing:  Intermittent Progression:  Unchanged Chronicity:  Recurrent Context: dental fracture and filling fell out   Relieved by:  Nothing Worsened by:  Pressure Ineffective treatments:  Acetaminophen, NSAIDs, heat and topical anesthetic gel Associated symptoms: neck pain   Associated symptoms: no congestion, no difficulty swallowing, no drooling, no facial pain, no facial swelling, no fever, no gum swelling, no headaches, no neck swelling,  no oral bleeding, no oral lesions and no trismus   Risk factors: lack of dental care and smoking     Past Medical History  Diagnosis Date  . Chlamydia   . Trichomonas   . Preterm labor   . Headache(784.0)   . UTI (lower urinary tract infection)   . BV (bacterial vaginosis)   . Yeast vaginitis   . Abscess of axilla, right    Past Surgical History  Procedure Laterality Date  . Cyst removed from l buttock    . Cesarean section    . Wisdom tooth extraction    . Wisdom tooth extraction  2009  . Cystectomy  2009    left buttock  . Cesarean section  04/18/2012    Procedure: CESAREAN SECTION;  Surgeon: Guss Bunde, MD;  Location: [facility];  Service: Gynecology;  Laterality: N/A;  repeat  . Incision and drainage abscess Right 11/01/2014    Procedure: INCISION AND DRAINAGE RIGHT AXILLARY ABSCESS;  Surgeon: Leighton Ruff, MD;  Location: [facility];  Service: General;  Laterality: Right;   Family History  Problem Relation Age of Onset  . Hypertension Mother   . Diabetes Mellitus I Father    History  Substance Use Topics  . Smoking status: Never Smoker   . Smokeless tobacco: Never Used  . Alcohol Use: 0.5 oz/week    1 Not specified per week     Comment: "special occasions"   OB History    Gravida Para Term Preterm AB TAB SAB Ectopic Multiple Living   3 2 0 2 1 0  1 0 0 2     Review of Systems  Constitutional: Negative for fever and chills.  HENT: Positive for dental problem. Negative for congestion, drooling, ear discharge, ear pain, facial swelling, mouth sores, sinus pressure and trouble swallowing.   Respiratory: Negative for shortness of breath.   Cardiovascular: Negative for chest pain.  Gastrointestinal: Negative for nausea, vomiting and abdominal pain.  Musculoskeletal: Positive for neck pain. Negative for myalgias, arthralgias and neck stiffness.  Skin: Negative for color change.  Neurological: Negative for weakness, numbness and headaches.   10 Systems reviewed and are  negative for acute change except as noted in the HPI.    Allergies  Vicodin  Home Medications   Prior to Admission medications   Medication Sig Start Date End Date Taking? Authorizing Provider  acetaminophen (TYLENOL) 500 MG tablet Take 1,000 mg by mouth every 6 (six) hours as needed for moderate pain (pain).    Yes Historical Provider, MD  ferrous sulfate 325 (65 FE) MG tablet Take 1 tablet (325 mg total) by mouth 3 (three) times daily with meals. 11/03/14  Yes Rolm Bookbinder, MD  oxyCODONE-acetaminophen (PERCOCET) 10-325 MG per tablet Take 1 tablet by mouth every 6 (six) hours as needed for pain. Patient not taking: Reported on 01/04/2015 11/03/14 11/03/15  Rolm Bookbinder, MD  oxyCODONE-acetaminophen (PERCOCET) 5-325 MG per tablet Take 1-2 tablets by mouth every 6 (six) hours as needed for severe pain. Patient not taking: Reported on 01/04/2015 11/26/14   Patty Sermons Camprubi-Soms, PA-C  terconazole (TERAZOL 3) 80 MG vaginal suppository Place 1 suppository (80 mg total) vaginally at bedtime. Patient not taking: Reported on 10/30/2014 08/11/14   Billy Fischer, MD   BP 126/77 mmHg  Pulse 70  Temp(Src) 97.9 F (36.6 C) (Oral)  Resp 18  Ht 5\' 8"  (1.727 m)  Wt 198 lb (89.812 kg)  BMI 30.11 kg/m2  SpO2 99%  LMP 12/29/2014 Physical Exam  Constitutional: She is oriented to person, place, and time. Vital signs are normal. She appears well-developed and well-nourished.  Non-toxic appearance. No distress.  Afebrile, nontoxic, NAD  HENT:  Head: Normocephalic and atraumatic.  Mouth/Throat: Oropharynx is clear and moist and mucous membranes are normal. No trismus in the jaw. Abnormal dentition. Dental caries present. No dental abscesses or uvula swelling.    Poor oral dentitia diffusely throughout Tooth # 18 TTP with filling partially broken and missing, defect to tooth. No obvious abscess, no gingival swelling or erythema, no trismus or drooling.  Eyes: Conjunctivae and EOM are  normal. Right eye exhibits no discharge. Left eye exhibits no discharge.  Neck: Normal range of motion. Neck supple.  Cardiovascular: Normal rate, normal heart sounds and intact distal pulses.   Pulmonary/Chest: Effort normal. No respiratory distress.  Abdominal: Normal appearance. She exhibits no distension.  Musculoskeletal: Normal range of motion.  Lymphadenopathy:       Head (left side): Submandibular adenopathy present.  Mild L sided submandibular LAD, likely reactive  Neurological: She is alert and oriented to person, place, and time. She has normal strength. No sensory deficit.  Skin: Skin is warm, dry and intact. No rash noted.  Psychiatric: She has a normal mood and affect.  Nursing note and vitals reviewed.   ED Course  Procedures (including critical care time) Labs Review Labs Reviewed - No data to display  Imaging Review No results found.   EKG Interpretation None      MDM   Final diagnoses:  Pain, dental  Dental decay  29 y.o. female with Dental pain associated with dental fracture and possible dental abscess given reactive LAD being present, with patient afebrile, non toxic appearing and swallowing secretions well. I gave patient referral to dentist and stressed the importance of dental follow up for ultimate management of dental pain.  I have also discussed reasons to return immediately to the ER.  Patient expresses understanding and agrees with plan.  I will also give doxycycline and pain control.    BP 125/71 mmHg  Pulse 74  Temp(Src) 97.9 F (36.6 C) (Oral)  Resp 16  Ht 5\' 8"  (1.727 m)  Wt 198 lb (89.812 kg)  BMI 30.11 kg/m2  SpO2 99%  LMP 12/29/2014  Meds ordered this encounter  Medications  . oxyCODONE-acetaminophen (PERCOCET/ROXICET) 5-325 MG per tablet 1 tablet    Sig:   . naproxen (NAPROSYN) 500 MG tablet    Sig: Take 1 tablet (500 mg total) by mouth 2 (two) times daily as needed for mild pain, moderate pain or headache (TAKE WITH MEALS.).     Dispense:  20 tablet    Refill:  0    Order Specific Question:  Supervising Provider    Answer:  Noemi Chapel D [[id number]]  . oxyCODONE-acetaminophen (PERCOCET) 5-325 MG per tablet    Sig: Take 1-2 tablets by mouth every 6 (six) hours as needed for severe pain.    Dispense:  10 tablet    Refill:  0    Order Specific Question:  Supervising Provider    Answer:  Noemi Chapel D [[id number]]  . doxycycline (VIBRAMYCIN) 100 MG capsule    Sig: Take 1 capsule (100 mg total) by mouth 2 (two) times daily. One po bid x 7 days    Dispense:  14 capsule    Refill:  0    Order Specific Question:  Supervising Provider    Answer:  Johnna Acosta [address] Camprubi-Soms, PA-C 01/04/15 [id number]  Everlene Balls, MD 01/04/15 [phone]

## 2015-01-04 NOTE — Discharge Instructions (Signed)
Apply warm compresses to jaw throughout the day. Take antibiotic until finished. Take naprosyn and percocet as directed, as needed for pain but do not drive or operate machinery with pain medication use. Followup with a dentist is very important for ongoing evaluation and management of recurrent dental pain. Use the list below or call the referral above to try to set up an appointment sooner than the end of the month. Return to emergency department for emergent changing or worsening symptoms.    Dental Pain A tooth ache may be caused by cavities (tooth decay). Cavities expose the nerve of the tooth to air and hot or cold temperatures. It may come from an infection or abscess (also called a boil or furuncle) around your tooth. It is also often caused by dental caries (tooth decay). This causes the pain you are having. DIAGNOSIS  Your caregiver can diagnose this problem by exam. TREATMENT   If caused by an infection, it may be treated with medications which kill germs (antibiotics) and pain medications as prescribed by your caregiver. Take medications as directed.  Only take over-the-counter or prescription medicines for pain, discomfort, or fever as directed by your caregiver.  Whether the tooth ache today is caused by infection or dental disease, you should see your dentist as soon as possible for further care. SEEK MEDICAL CARE IF: The exam and treatment you received today has been provided on an emergency basis only. This is not a substitute for complete medical or dental care. If your problem worsens or new problems (symptoms) appear, and you are unable to meet with your dentist, call or return to this location. SEEK IMMEDIATE MEDICAL CARE IF:   You have a fever.  You develop redness and swelling of your face, jaw, or neck.  You are unable to open your mouth.  You have severe pain uncontrolled by pain medicine. MAKE SURE YOU:   Understand these instructions.  Will watch your  condition.  Will get help right away if you are not doing well or get worse. Document Released: 12/07/2005 Document Revised: 02/29/2012 Document Reviewed: 07/25/2008 [facility] Patient Information 2015 [employer], [place]. This information is not intended to replace advice given to you by your health care provider. Make sure you discuss any questions you have with your health care provider. Emergency Department Resource Guide 1) Find a Doctor and Pay Out of Pocket Although you won't have to find out who is covered by your insurance plan, it is a good idea to ask around and get recommendations. You will then need to call the office and see if the doctor you have chosen will accept you as a new patient and what types of options they offer for patients who are self-pay. Some doctors offer discounts or will set up payment plans for their patients who do not have insurance, but you will need to ask so you aren't surprised when you get to your appointment.  2) Contact Your Local Health Department Not all health departments have doctors that can see patients for sick visits, but many do, so it is worth a call to see if yours does. If you don't know where your local health department is, you can check in your phone book. The CDC also has a tool to help you locate your state's health department, and many state websites also have listings of all of their local health departments.  3) Find a [facility] If your illness is not likely to be very severe or complicated, you may  want to try a walk in clinic. These are popping up all over the country in pharmacies, drugstores, and shopping centers. They're usually staffed by nurse practitioners or physician assistants that have been trained to treat common illnesses and complaints. They're usually fairly quick and inexpensive. However, if you have serious medical issues or chronic medical problems, these are probably not your best option.  No Primary Care Doctor: - Call  Health Connect at  [phone] - they can help you locate a primary care doctor that  accepts your insurance, provides certain services, etc. - Physician Referral Service- [phone]  Chronic Pain Problems: Organization         Address  Phone   Notes  [facility]  [phone] Patients need to be referred by their primary care doctor.   Medication Assistance: Organization         Address  Phone   Notes  [facility] Medication [facility] [address] [place], [place] [address] [phone] --Must be a resident of [facility] -- Must have NO insurance coverage whatsoever (no Medicaid/ Medicare, etc.) -- The pt. MUST have a primary care doctor that directs their care regularly and follows them in the community   [facility]  [phone]   [place]  [phone]     Dental Care: Organization         Address  Phone  Notes  [facility] Department of [facility] [phone] Accepts children up to age 66 who are enrolled in [place] or [place]; pregnant women with a Medicaid card; and children who have applied for Medicaid or [place] Health Choice, but were declined, whose parents can pay a reduced fee at time of service.  [facility] Department of [facility]  [address], [place] [phone] Accepts children up to age 13 who are enrolled in [place] or [place]; pregnant women with a Medicaid card; and children who have applied for Medicaid or [place] Health Choice, but were declined, whose parents can pay a reduced fee at time of service.  [facility] Adult [facility] PRO[facility]  [place] [phone] Patients are seen by appointment only. Walk-ins are not accepted. [place] will see patients 19 years of age and older. Monday - Tuesday (8am-5pm) Most Wednesdays (8:30-5pm) $30 per visit, cash only   [facility] Adult Dental Access PROGRAM  [address], [facility] [phone] Patients are seen by appointment only. Walk-ins are not accepted. [place] will see patients 76 years of age and older. One Wednesday Evening (Monthly: Volunteer Based).  $30 per visit, cash only  [place]  [phone] for adults; Children under age 59, call Graduate Pediatric Dentistry at [phone]. Children aged 62-14, please call [phone] to request a pediatric application.  Dental services are provided in all areas of dental care including fillings, crowns and bridges, complete and partial dentures, implants, gum treatment, root canals, and extractions. Preventive care is also provided. Treatment is provided to both adults and children. Patients are selected via a lottery and there is often a waiting list.   [facility] [address] , [place]  [phone] [email or web address], [place], [place] [phone], Ext. [phone] Second and Fourth Thursday of each month, opens at 6:30 AM; Clinic ends at 9  AM.  Patients are seen on a first-come first-served basis, and a limited number are seen during each clinic.   [facility]  [address]  [place], [place] [phone]   Eligibility Requirements You must have lived in [place], [place], or [place] counties for at least the last three months.   You cannot be eligible for state or federal sponsored [employer], including [employer], [place], or [employer].   You generally cannot be eligible for healthcare insurance through your employer.    How to apply: Eligibility screenings are held every Tuesday and Wednesday afternoon from 1:00 pm until 4:00 pm. You do not need an appointment for the interview!  [facility] [address] [place], [place]   [place]  [place]  [place]  [phone]

## 2015-02-14 ENCOUNTER — Emergency Department (HOSPITAL_COMMUNITY)
Admission: EM | Admit: 2015-02-14 | Discharge: 2015-02-14 | Disposition: A | Discharge status: Home / Self Care | Payer: Self-pay | Source: Home / Self Care | Attending: Family Medicine | Admitting: Family Medicine

## 2015-02-14 ENCOUNTER — Encounter (HOSPITAL_COMMUNITY): Payer: Self-pay | Admitting: *Deleted

## 2015-02-14 ENCOUNTER — Other Ambulatory Visit (HOSPITAL_COMMUNITY)
Admission: RE | Admit: 2015-02-14 | Discharge: 2015-02-14 | Disposition: A | Discharge status: Home / Self Care | Payer: Medicaid Other | Source: Ambulatory Visit | Attending: Family Medicine | Admitting: Family Medicine

## 2015-02-14 DIAGNOSIS — G8929 Other chronic pain: Secondary | ICD-10-CM

## 2015-02-14 DIAGNOSIS — K089 Disorder of teeth and supporting structures, unspecified: Secondary | ICD-10-CM

## 2015-02-14 DIAGNOSIS — A499 Bacterial infection, unspecified: Secondary | ICD-10-CM

## 2015-02-14 DIAGNOSIS — K047 Periapical abscess without sinus: Secondary | ICD-10-CM

## 2015-02-14 DIAGNOSIS — B9689 Other specified bacterial agents as the cause of diseases classified elsewhere: Secondary | ICD-10-CM

## 2015-02-14 DIAGNOSIS — Z113 Encounter for screening for infections with a predominantly sexual mode of transmission: Secondary | ICD-10-CM | POA: Diagnosis present

## 2015-02-14 DIAGNOSIS — N760 Acute vaginitis: Secondary | ICD-10-CM | POA: Diagnosis present

## 2015-02-14 DIAGNOSIS — N898 Other specified noninflammatory disorders of vagina: Secondary | ICD-10-CM

## 2015-02-14 LAB — POCT URINALYSIS DIP (DEVICE)
BILIRUBIN URINE: NEGATIVE
GLUCOSE, UA: NEGATIVE mg/dL
Hgb urine dipstick: NEGATIVE
Ketones, ur: NEGATIVE mg/dL
Leukocytes, UA: NEGATIVE
NITRITE: NEGATIVE
Protein, ur: NEGATIVE mg/dL
Specific Gravity, Urine: 1.025 (ref 1.005–1.030)
UROBILINOGEN UA: 2 mg/dL — AB (ref 0.0–1.0)
pH: 7 (ref 5.0–8.0)

## 2015-02-14 LAB — POCT PREGNANCY, URINE: Preg Test, Ur: NEGATIVE

## 2015-02-14 MED ORDER — AMOXICILLIN 500 MG PO CAPS: 1000.0000 mg | ORAL_CAPSULE | Freq: Two times a day (BID) | ORAL | Status: DC

## 2015-02-14 MED ORDER — KETOROLAC TROMETHAMINE 60 MG/2ML IM SOLN
60.0000 mg | Freq: Once | INTRAMUSCULAR | Status: AC
  Administered 2015-02-14: 60 mg via INTRAMUSCULAR

## 2015-02-14 MED ORDER — OXYCODONE-ACETAMINOPHEN 5-325 MG PO TABS: 1.0000 | ORAL_TABLET | Freq: Four times a day (QID) | ORAL | Status: DC | PRN

## 2015-02-14 MED ORDER — METRONIDAZOLE 500 MG PO TABS: 500.0000 mg | ORAL_TABLET | Freq: Two times a day (BID) | ORAL | Status: DC

## 2015-02-14 MED ORDER — KETOROLAC TROMETHAMINE 60 MG/2ML IM SOLN
INTRAMUSCULAR | Status: AC
  Filled 2015-02-14: qty 2

## 2015-02-14 NOTE — ED Notes (Addendum)
Pt  Reports     l  Side        Of  Face       Swelling  With     Toothache      X   3  Days          Pt        Sitting  Upright  On  The  Exam table  Speaking  In  Complete  sentances       Pt  Also  Reports  Symptoms  Of  A  Vaginal  Discharge

## 2015-02-14 NOTE — ED Provider Notes (Signed)
CSN: [id number]     Arrival date & time 02/14/15  1031 History   First MD Initiated Contact with Patient 02/14/15 1141     Chief Complaint  Patient presents with  . Dental Problem   (Consider location/radiation/quality/duration/timing/severity/associated sxs/prior Treatment) HPI Comments: 29 year old females complaining of a toothache for 2 days. This is followed by localized left lower facial swelling.  After assessment of the toothache and swelling the patient states that she has a vaginal discharge with itching for one week and wants to be evaluated for that.   Past Medical History  Diagnosis Date  . Chlamydia   . Trichomonas   . Preterm labor   . Headache(784.0)   . UTI (lower urinary tract infection)   . BV (bacterial vaginosis)   . Yeast vaginitis   . Abscess of axilla, right    Past Surgical History  Procedure Laterality Date  . Cyst removed from l buttock    . Cesarean section    . Wisdom tooth extraction    . Wisdom tooth extraction  2009  . Cystectomy  2009    left buttock  . Cesarean section  04/18/2012    Procedure: CESAREAN SECTION;  Surgeon: Guss Bunde, MD;  Location: [facility];  Service: Gynecology;  Laterality: N/A;  repeat  . Incision and drainage abscess Right 11/01/2014    Procedure: INCISION AND DRAINAGE RIGHT AXILLARY ABSCESS;  Surgeon: Leighton Ruff, MD;  Location: [facility];  Service: General;  Laterality: Right;   Family History  Problem Relation Age of Onset  . Hypertension Mother   . Diabetes Mellitus I Father    History  Substance Use Topics  . Smoking status: Never Smoker   . Smokeless tobacco: Never Used  . Alcohol Use: 0.5 oz/week    1 Standard drinks or equivalent per week     Comment: "special occasions"   OB History    Gravida Para Term Preterm AB TAB SAB Ectopic Multiple Living   3 2 0 2 1 0 1 0 0 2      Review of Systems  Constitutional: Negative.   HENT: Positive for dental problem.   Respiratory: Negative.   Cardiovascular:  Negative.   Gastrointestinal: Negative.   Genitourinary: Positive for vaginal discharge. Negative for dysuria and frequency.  Musculoskeletal: Negative.     Allergies  Vicodin  Home Medications   Prior to Admission medications   Medication Sig Start Date End Date Taking? Authorizing Provider  acetaminophen (TYLENOL) 500 MG tablet Take 1,000 mg by mouth every 6 (six) hours as needed for moderate pain (pain).     Historical Provider, MD  amoxicillin (AMOXIL) 500 MG capsule Take 2 capsules (1,000 mg total) by mouth 2 (two) times daily. 02/14/15   Janne Napoleon, NP  doxycycline (VIBRAMYCIN) 100 MG capsule Take 1 capsule (100 mg total) by mouth 2 (two) times daily. One po bid x 7 days 01/04/15   Patty Sermons Camprubi-Soms, PA-C  ferrous sulfate 325 (65 FE) MG tablet Take 1 tablet (325 mg total) by mouth 3 (three) times daily with meals. 11/03/14   Rolm Bookbinder, MD  metroNIDAZOLE (FLAGYL) 500 MG tablet Take 1 tablet (500 mg total) by mouth 2 (two) times daily. X 7 days 02/14/15   Janne Napoleon, NP  naproxen (NAPROSYN) 500 MG tablet Take 1 tablet (500 mg total) by mouth 2 (two) times daily as needed for mild pain, moderate pain or headache (TAKE WITH MEALS.). 01/04/15   Mercedes Strupp Camprubi-Soms, PA-C  oxyCODONE-acetaminophen (PERCOCET/ROXICET) 5-325  MG per tablet Take 1 tablet by mouth every 6 (six) hours as needed for severe pain. 02/14/15   Janne Napoleon, NP   BP 122/78 mmHg  Pulse 78  Temp(Src) 98.6 F (37 C) (Oral)  Resp 18  SpO2 100%  LMP 01/28/2015 Physical Exam  Constitutional: She is oriented to person, place, and time. She appears well-developed and well-nourished. No distress.  HENT:  Mouth/Throat: Oropharynx is clear and moist.  No visible lesions to the left lower third molars. There is an area of swelling to the gingiva adjacent to tooth #21 of the left lower molars. Palpable tenderness with facial swelling to the left lower face. No surrounding lymphadenitis. Oropharynx is  clear.  Pulmonary/Chest: Effort normal. No respiratory distress.  Genitourinary:  Normal external female genitalia. There is a thin wall whitish gray to green discharge at the introitus and coating the vaginal walls. The bulk of the discharge is in the vaginal vault and covering the cervix. Due to body habitus only a portion of the cervix can be captured. No lesions are seen. No bleeding seen. No CMT. No adnexal tenderness.  Neurological: She is alert and oriented to person, place, and time.  Skin: Skin is warm.  Psychiatric: She has a normal mood and affect.  Nursing note and vitals reviewed.   ED Course  Procedures (including critical care time) Labs Review Labs Reviewed  POCT URINALYSIS DIP (DEVICE) - Abnormal; Notable for the following:    Urobilinogen, UA 2.0 (*)    All other components within normal limits  POCT PREGNANCY, URINE  CERVICOVAGINAL ANCILLARY ONLY   Results for orders placed or performed during the hospital encounter of 02/14/15  POCT urinalysis dip (device)  Result Value Ref Range   Glucose, UA NEGATIVE NEGATIVE mg/dL   Bilirubin Urine NEGATIVE NEGATIVE   Ketones, ur NEGATIVE NEGATIVE mg/dL   Specific Gravity, Urine 1.025 1.005 - 1.030   Hgb urine dipstick NEGATIVE NEGATIVE   pH 7.0 5.0 - 8.0   Protein, ur NEGATIVE NEGATIVE mg/dL   Urobilinogen, UA 2.0 (H) 0.0 - 1.0 mg/dL   Nitrite NEGATIVE NEGATIVE   Leukocytes, UA NEGATIVE NEGATIVE  Pregnancy, urine POC  Result Value Ref Range   Preg Test, Ur NEGATIVE NEGATIVE   All Imaging Review No results found.   MDM   1. Chronic dental pain   2. Dental abscess   3. Vaginal discharge   4. BV (bacterial vaginosis)    Amoxicillin as directed Percocet 5 mg #15 Flagyl as directed Need to obtain a PCP for health maintenance an acute care when necessary. Cervical cytology pending    Janne Napoleon, NP 02/14/15 1313

## 2015-02-14 NOTE — Discharge Instructions (Signed)
Bacterial Vaginosis Bacterial vaginosis is an infection of the vagina. It happens when too many of certain germs (bacteria) grow in the vagina. HOME CARE  Take your medicine as told by your doctor.  Finish your medicine even if you start to feel better.  Do not have sex until you finish your medicine and are better.  Tell your sex partner that you have an infection. They should see their doctor for treatment.  Practice safe sex. Use condoms. Have only one sex partner. GET HELP IF:  You are not getting better after 3 days of treatment.  You have more grey fluid (discharge) coming from your vagina than before.  You have more pain than before.  You have a fever. MAKE SURE YOU:   Understand these instructions.  Will watch your condition.  Will get help right away if you are not doing well or get worse. Document Released: 09/15/2008 Document Revised: 09/27/2013 Document Reviewed: 07/19/2013 [facility] Patient Information 2015 [place], [place]. This information is not intended to replace advice given to you by your health care provider. Make sure you discuss any questions you have with your health care provider.  Dental Pain A tooth ache may be caused by cavities (tooth decay). Cavities expose the nerve of the tooth to air and hot or cold temperatures. It may come from an infection or abscess (also called a boil or furuncle) around your tooth. It is also often caused by dental caries (tooth decay). This causes the pain you are having. DIAGNOSIS  Your caregiver can diagnose this problem by exam. TREATMENT   If caused by an infection, it may be treated with medications which kill germs (antibiotics) and pain medications as prescribed by your caregiver. Take medications as directed.  Only take over-the-counter or prescription medicines for pain, discomfort, or fever as directed by your caregiver.  Whether the tooth ache today is caused by infection or dental disease, you should see your  dentist as soon as possible for further care. SEEK MEDICAL CARE IF: The exam and treatment you received today has been provided on an emergency basis only. This is not a substitute for complete medical or dental care. If your problem worsens or new problems (symptoms) appear, and you are unable to meet with your dentist, call or return to this location. SEEK IMMEDIATE MEDICAL CARE IF:   You have a fever.  You develop redness and swelling of your face, jaw, or neck.  You are unable to open your mouth.  You have severe pain uncontrolled by pain medicine. MAKE SURE YOU:   Understand these instructions.  Will watch your condition.  Will get help right away if you are not doing well or get worse. Document Released: 12/07/2005 Document Revised: 02/29/2012 Document Reviewed: 07/25/2008 [facility] Patient Information 2015 [place], [place]. This information is not intended to replace advice given to you by your health care provider. Make sure you discuss any questions you have with your health care provider.  Dental Abscess A dental abscess is a collection of infected fluid (pus) from a bacterial infection in the inner part of the tooth (pulp). It usually occurs at the end of the tooth's root.  CAUSES   Severe tooth decay.  Trauma to the tooth that allows bacteria to enter into the pulp, such as a broken or chipped tooth. SYMPTOMS   Severe pain in and around the infected tooth.  Swelling and redness around the abscessed tooth or in the mouth or face.  Tenderness.  Pus drainage.  Bad breath.  Bitter taste in the mouth.  Difficulty swallowing.  Difficulty opening the mouth.  Nausea.  Vomiting.  Chills.  Swollen neck glands. DIAGNOSIS   A medical and dental history will be taken.  An examination will be performed by tapping on the abscessed tooth.  X-rays may be taken of the tooth to identify the abscess. TREATMENT The goal of treatment is to eliminate the infection.  You may be prescribed antibiotic medicine to stop the infection from spreading. A root canal may be performed to save the tooth. If the tooth cannot be saved, it may be pulled (extracted) and the abscess may be drained.  HOME CARE INSTRUCTIONS  Only take over-the-counter or prescription medicines for pain, fever, or discomfort as directed by your caregiver.  Rinse your mouth (gargle) often with salt water ( tsp salt in 8 oz [250 ml] of warm water) to relieve pain or swelling.  Do not drive after taking pain medicine (narcotics).  Do not apply heat to the outside of your face.  Return to your dentist for further treatment as directed. SEEK MEDICAL CARE IF:  Your pain is not helped by medicine.  Your pain is getting worse instead of better. SEEK IMMEDIATE MEDICAL CARE IF:  You have a fever or persistent symptoms for more than 2-3 days.  You have a fever and your symptoms suddenly get worse.  You have chills or a very bad headache.  You have problems breathing or swallowing.  You have trouble opening your mouth.  You have swelling in the neck or around the eye. Document Released: 12/07/2005 Document Revised: 08/31/2012 Document Reviewed: 03/17/2011 [facility] Patient Information 2015 [place], [place]. This information is not intended to replace advice given to you by your health care provider. Make sure you discuss any questions you have with your health care provider.  Vaginitis Vaginitis is an inflammation of the vagina. It is most often caused by a change in the normal balance of the bacteria and yeast that live in the vagina. This change in balance causes an overgrowth of certain bacteria or yeast, which causes the inflammation. There are different types of vaginitis, but the most common types are:  Bacterial vaginosis.  Yeast infection (candidiasis).  Trichomoniasis vaginitis. This is a sexually transmitted infection (STI).  Viral vaginitis.  Atropic vaginitis.  Allergic  vaginitis. CAUSES  The cause depends on the type of vaginitis. Vaginitis can be caused by:  Bacteria (bacterial vaginosis).  Yeast (yeast infection).  A parasite (trichomoniasis vaginitis)  A virus (viral vaginitis).  Low hormone levels (atrophic vaginitis). Low hormone levels can occur during pregnancy, breastfeeding, or after menopause.  Irritants, such as bubble baths, scented tampons, and feminine sprays (allergic vaginitis). Other factors can change the normal balance of the yeast and bacteria that live in the vagina. These include:  Antibiotic medicines.  Poor hygiene.  Diaphragms, vaginal sponges, spermicides, birth control pills, and intrauterine devices (IUD).  Sexual intercourse.  Infection.  Uncontrolled diabetes.  A weakened immune system. SYMPTOMS  Symptoms can vary depending on the cause of the vaginitis. Common symptoms include:  Abnormal vaginal discharge.  The discharge is white, gray, or yellow with bacterial vaginosis.  The discharge is thick, white, and cheesy with a yeast infection.  The discharge is frothy and yellow or greenish with trichomoniasis.  A bad vaginal odor.  The odor is fishy with bacterial vaginosis.  Vaginal itching, pain, or swelling.  Painful intercourse.  Pain or burning when urinating. Sometimes, there are no symptoms.  TREATMENT  Treatment will vary depending on the type of infection.   Bacterial vaginosis and trichomoniasis are often treated with antibiotic creams or pills.  Yeast infections are often treated with antifungal medicines, such as vaginal creams or suppositories.  Viral vaginitis has no cure, but symptoms can be treated with medicines that relieve discomfort. Your sexual partner should be treated as well.  Atrophic vaginitis may be treated with an estrogen cream, pill, suppository, or vaginal ring. If vaginal dryness occurs, lubricants and moisturizing creams may help. You may be told to avoid scented  soaps, sprays, or douches.  Allergic vaginitis treatment involves quitting the use of the product that is causing the problem. Vaginal creams can be used to treat the symptoms. HOME CARE INSTRUCTIONS   Take all medicines as directed by your caregiver.  Keep your genital area clean and dry. Avoid soap and only rinse the area with water.  Avoid douching. It can remove the healthy bacteria in the vagina.  Do not use tampons or have sexual intercourse until your vaginitis has been treated. Use sanitary pads while you have vaginitis.  Wipe from front to back. This avoids the spread of bacteria from the rectum to the vagina.  Let air reach your genital area.  Wear cotton underwear to decrease moisture buildup.  Avoid wearing underwear while you sleep until your vaginitis is gone.  Avoid tight pants and underwear or nylons without a cotton panel.  Take off wet clothing (especially bathing suits) as soon as possible.  Use mild, non-scented products. Avoid using irritants, such as:  Scented feminine sprays.  Fabric softeners.  Scented detergents.  Scented tampons.  Scented soaps or bubble baths.  Practice safe sex and use condoms. Condoms may prevent the spread of trichomoniasis and viral vaginitis. SEEK MEDICAL CARE IF:   You have abdominal pain.  You have a fever or persistent symptoms for more than 2-3 days.  You have a fever and your symptoms suddenly get worse. Document Released: 10/04/2007 Document Revised: 08/31/2012 Document Reviewed: 05/19/2012 [facility] Patient Information 2015 [place], [place]. This information is not intended to replace advice given to you by your health care provider. Make sure you discuss any questions you have with your health care provider.

## 2015-02-15 LAB — CERVICOVAGINAL ANCILLARY ONLY
Chlamydia: NEGATIVE
NEISSERIA GONORRHEA: NEGATIVE
WET PREP (BD AFFIRM): POSITIVE — AB
Wet Prep (BD Affirm): NEGATIVE
Wet Prep (BD Affirm): POSITIVE — AB

## 2015-02-21 ENCOUNTER — Telehealth (HOSPITAL_COMMUNITY): Payer: Self-pay | Admitting: *Deleted

## 2015-02-21 NOTE — ED Notes (Signed)
GC/Chlamydia neg., Affirm: Candida and Gardnerella pos., Trich neg.  Pt. adequately treated with Flagyl.  2/27  Message sent to Janne Napoleon NP.  3/2 He said if still having discharge she can use OTC Monistat.   I called pt. Pt. verified x 2 and given results.  Pt. given this information and voiced understanding. 02/21/2015

## 2015-05-27 ENCOUNTER — Emergency Department (HOSPITAL_COMMUNITY)
Admission: EM | Admit: 2015-05-27 | Discharge: 2015-05-27 | Disposition: A | Discharge status: Home / Self Care | Payer: Medicaid Other

## 2015-08-09 ENCOUNTER — Encounter (HOSPITAL_COMMUNITY): Payer: Self-pay | Admitting: *Deleted

## 2015-08-09 ENCOUNTER — Emergency Department (HOSPITAL_COMMUNITY): Payer: Medicaid Other | Admitting: Anesthesiology

## 2015-08-09 ENCOUNTER — Observation Stay (HOSPITAL_COMMUNITY)
Admission: EM | Admit: 2015-08-09 | Discharge: 2015-08-10 | Disposition: A | Discharge status: Home Health | Payer: Medicaid Other | Attending: Surgery | Admitting: Surgery

## 2015-08-09 ENCOUNTER — Encounter (HOSPITAL_COMMUNITY)
Admission: EM | Disposition: A | Discharge status: Home Health | Payer: Self-pay | Source: Home / Self Care | Attending: Emergency Medicine

## 2015-08-09 DIAGNOSIS — Z833 Family history of diabetes mellitus: Secondary | ICD-10-CM | POA: Diagnosis not present

## 2015-08-09 DIAGNOSIS — Z8249 Family history of ischemic heart disease and other diseases of the circulatory system: Secondary | ICD-10-CM | POA: Insufficient documentation

## 2015-08-09 DIAGNOSIS — L02411 Cutaneous abscess of right axilla: Secondary | ICD-10-CM | POA: Diagnosis present

## 2015-08-09 DIAGNOSIS — R1110 Vomiting, unspecified: Secondary | ICD-10-CM | POA: Diagnosis not present

## 2015-08-09 DIAGNOSIS — L02419 Cutaneous abscess of limb, unspecified: Secondary | ICD-10-CM | POA: Diagnosis present

## 2015-08-09 HISTORY — PX: IRRIGATION AND DEBRIDEMENT ABSCESS: SHX5252

## 2015-08-09 LAB — BASIC METABOLIC PANEL
Anion gap: 6 (ref 5–15)
CHLORIDE: 106 mmol/L (ref 101–111)
CO2: 24 mmol/L (ref 22–32)
CREATININE: 0.71 mg/dL (ref 0.44–1.00)
Calcium: 8.9 mg/dL (ref 8.9–10.3)
GFR calc Af Amer: >60 mL/min (ref >60)
GFR calc non Af Amer: >60 mL/min (ref >60)
Glucose, Bld: 98 mg/dL (ref 65–99)
Potassium: 3.9 mmol/L (ref 3.5–5.1)
SODIUM: 136 mmol/L (ref 135–145)

## 2015-08-09 LAB — CBC WITH DIFFERENTIAL/PLATELET
BASOS PCT: 0 % (ref 0–1)
Basophils Absolute: 0 10*3/uL (ref 0.0–0.1)
Eosinophils Absolute: 0.1 10*3/uL (ref 0.0–0.7)
Eosinophils Relative: 1 % (ref 0–5)
HCT: 27.5 % — ABNORMAL LOW (ref 36.0–46.0)
HEMOGLOBIN: 8.2 g/dL — AB (ref 12.0–15.0)
LYMPHS ABS: 1.1 10*3/uL (ref 0.7–4.0)
Lymphocytes Relative: 13 % (ref 12–46)
MCH: 17.9 pg — AB (ref 26.0–34.0)
MCHC: 29.8 g/dL — AB (ref 30.0–36.0)
MCV: 59.9 fL — ABNORMAL LOW (ref 78.0–100.0)
Monocytes Absolute: 0.5 10*3/uL (ref 0.1–1.0)
Monocytes Relative: 6 % (ref 3–12)
NEUTROS ABS: 6.9 10*3/uL (ref 1.7–7.7)
Neutrophils Relative %: 80 % — ABNORMAL HIGH (ref 43–77)
Platelets: 211 10*3/uL (ref 150–400)
RBC: 4.59 MIL/uL (ref 3.87–5.11)
RDW: 20.9 % — AB (ref 11.5–15.5)
WBC: 8.6 10*3/uL (ref 4.0–10.5)

## 2015-08-09 LAB — URINE MICROSCOPIC-ADD ON

## 2015-08-09 LAB — URINALYSIS, ROUTINE W REFLEX MICROSCOPIC
Bilirubin Urine: NEGATIVE
GLUCOSE, UA: NEGATIVE mg/dL
KETONES UR: NEGATIVE mg/dL
Leukocytes, UA: NEGATIVE
Nitrite: NEGATIVE
PH: 5.5 (ref 5.0–8.0)
Protein, ur: NEGATIVE mg/dL
Specific Gravity, Urine: 1.021 (ref 1.005–1.030)
Urobilinogen, UA: 1 mg/dL (ref 0.0–1.0)

## 2015-08-09 LAB — I-STAT CG4 LACTIC ACID, ED: Lactic Acid, Venous: 0.59 mmol/L (ref 0.5–2.0)

## 2015-08-09 LAB — POC URINE PREG, ED: Preg Test, Ur: NEGATIVE

## 2015-08-09 SURGERY — IRRIGATION AND DEBRIDEMENT ABSCESS
Anesthesia: General | Laterality: Right

## 2015-08-09 MED ORDER — MIDAZOLAM HCL 5 MG/5ML IJ SOLN
INTRAMUSCULAR | Status: DC | PRN
  Administered 2015-08-09: 2 mg via INTRAVENOUS

## 2015-08-09 MED ORDER — HYDROMORPHONE HCL 1 MG/ML IJ SOLN
INTRAMUSCULAR | Status: AC
  Filled 2015-08-09: qty 1

## 2015-08-09 MED ORDER — HYDROMORPHONE HCL 1 MG/ML IJ SOLN
0.2500 mg | INTRAMUSCULAR | Status: DC | PRN
  Administered 2015-08-09: 0.25 mg via INTRAVENOUS

## 2015-08-09 MED ORDER — HYDROMORPHONE HCL 1 MG/ML IJ SOLN
0.5000 mg | INTRAMUSCULAR | Status: DC | PRN
  Filled 2015-08-09: qty 1

## 2015-08-09 MED ORDER — PROPOFOL 10 MG/ML IV BOLUS
INTRAVENOUS | Status: AC
  Filled 2015-08-09: qty 20

## 2015-08-09 MED ORDER — KCL IN DEXTROSE-NACL 20-5-0.45 MEQ/L-%-% IV SOLN
INTRAVENOUS | Status: DC
  Administered 2015-08-09: 19:00:00 via INTRAVENOUS
  Filled 2015-08-09 (×2): qty 1000

## 2015-08-09 MED ORDER — LACTATED RINGERS IV SOLN
INTRAVENOUS | Status: DC
  Administered 2015-08-09: 1000 mL via INTRAVENOUS

## 2015-08-09 MED ORDER — LIDOCAINE HCL (CARDIAC) 20 MG/ML IV SOLN
INTRAVENOUS | Status: DC | PRN
  Administered 2015-08-09: 30 mg via INTRAVENOUS

## 2015-08-09 MED ORDER — SODIUM CHLORIDE 0.9 % IV BOLUS (SEPSIS)
1000.0000 mL | Freq: Once | INTRAVENOUS | Status: AC
  Administered 2015-08-09: 1000 mL via INTRAVENOUS

## 2015-08-09 MED ORDER — ONDANSETRON 4 MG PO TBDP: 4.0000 mg | ORAL_TABLET | Freq: Four times a day (QID) | ORAL | Status: DC | PRN

## 2015-08-09 MED ORDER — BUPIVACAINE-EPINEPHRINE 0.25% -1:200000 IJ SOLN
INTRAMUSCULAR | Status: AC
  Filled 2015-08-09: qty 1

## 2015-08-09 MED ORDER — LACTATED RINGERS IV SOLN: INTRAVENOUS | Status: DC

## 2015-08-09 MED ORDER — CLINDAMYCIN PHOSPHATE 600 MG/50ML IV SOLN
600.0000 mg | Freq: Four times a day (QID) | INTRAVENOUS | Status: DC
  Administered 2015-08-09: 600 mg via INTRAVENOUS
  Filled 2015-08-09: qty 50

## 2015-08-09 MED ORDER — FENTANYL CITRATE (PF) 100 MCG/2ML IJ SOLN
INTRAMUSCULAR | Status: AC
  Filled 2015-08-09: qty 4

## 2015-08-09 MED ORDER — PROMETHAZINE HCL 25 MG/ML IJ SOLN: 6.2500 mg | INTRAMUSCULAR | Status: DC | PRN

## 2015-08-09 MED ORDER — PROPOFOL 10 MG/ML IV BOLUS
INTRAVENOUS | Status: DC | PRN
  Administered 2015-08-09: 150 mg via INTRAVENOUS

## 2015-08-09 MED ORDER — HYDROMORPHONE HCL 1 MG/ML IJ SOLN
0.5000 mg | Freq: Once | INTRAMUSCULAR | Status: AC
  Administered 2015-08-09: 0.5 mg via INTRAVENOUS
  Filled 2015-08-09: qty 1

## 2015-08-09 MED ORDER — DEXAMETHASONE SODIUM PHOSPHATE 10 MG/ML IJ SOLN
INTRAMUSCULAR | Status: AC
  Filled 2015-08-09: qty 1

## 2015-08-09 MED ORDER — ONDANSETRON HCL 4 MG/2ML IJ SOLN
INTRAMUSCULAR | Status: DC | PRN
Start: 2015-08-09 — End: 2015-08-09
  Administered 2015-08-09: 4 mg via INTRAVENOUS

## 2015-08-09 MED ORDER — FENTANYL CITRATE (PF) 100 MCG/2ML IJ SOLN
INTRAMUSCULAR | Status: DC | PRN
  Administered 2015-08-09: 25 ug via INTRAVENOUS
  Administered 2015-08-09 (×2): 50 ug via INTRAVENOUS

## 2015-08-09 MED ORDER — PIPERACILLIN-TAZOBACTAM 3.375 G IVPB
3.3750 g | Freq: Three times a day (TID) | INTRAVENOUS | Status: DC
  Administered 2015-08-09 – 2015-08-10 (×3): 3.375 g via INTRAVENOUS
  Filled 2015-08-09 (×4): qty 50

## 2015-08-09 MED ORDER — OXYCODONE-ACETAMINOPHEN 5-325 MG PO TABS
1.0000 | ORAL_TABLET | ORAL | Status: DC | PRN
  Administered 2015-08-09 – 2015-08-10 (×4): 2 via ORAL
  Filled 2015-08-09 (×4): qty 2

## 2015-08-09 MED ORDER — FENTANYL CITRATE (PF) 100 MCG/2ML IJ SOLN
INTRAMUSCULAR | Status: AC
  Filled 2015-08-09: qty 2

## 2015-08-09 MED ORDER — HYDROMORPHONE HCL 1 MG/ML IJ SOLN
0.5000 mg | INTRAMUSCULAR | Status: DC | PRN
  Administered 2015-08-10: 1 mg via INTRAVENOUS
  Filled 2015-08-09: qty 1

## 2015-08-09 MED ORDER — 0.9 % SODIUM CHLORIDE (POUR BTL) OPTIME
TOPICAL | Status: DC | PRN
  Administered 2015-08-09: 1000 mL

## 2015-08-09 MED ORDER — FENTANYL CITRATE (PF) 100 MCG/2ML IJ SOLN: 25.0000 ug | INTRAMUSCULAR | Status: DC | PRN

## 2015-08-09 MED ORDER — FENTANYL CITRATE (PF) 100 MCG/2ML IJ SOLN
25.0000 ug | INTRAMUSCULAR | Status: AC | PRN
  Administered 2015-08-09 (×4): 25 ug via INTRAVENOUS

## 2015-08-09 MED ORDER — ONDANSETRON HCL 4 MG/2ML IJ SOLN
4.0000 mg | Freq: Four times a day (QID) | INTRAMUSCULAR | Status: DC | PRN
Start: 2015-08-09 — End: 2015-08-10

## 2015-08-09 MED ORDER — MIDAZOLAM HCL 2 MG/2ML IJ SOLN
INTRAMUSCULAR | Status: AC
  Filled 2015-08-09: qty 2

## 2015-08-09 MED ORDER — DEXAMETHASONE SODIUM PHOSPHATE 10 MG/ML IJ SOLN
INTRAMUSCULAR | Status: DC | PRN
  Administered 2015-08-09: 10 mg via INTRAVENOUS

## 2015-08-09 SURGICAL SUPPLY — 34 items
APL SKNCLS STERI-STRIP NONHPOA (GAUZE/BANDAGES/DRESSINGS)
BENZOIN TINCTURE PRP APPL 2/3 (GAUZE/BANDAGES/DRESSINGS) ×1 IMPLANT
BLADE HEX COATED 2.75 (ELECTRODE) ×3 IMPLANT
BLADE SURG 15 STRL LF DISP TIS (BLADE) IMPLANT
BLADE SURG 15 STRL SS (BLADE)
BLADE SURG SZ10 CARB STEEL (BLADE) ×4 IMPLANT
BNDG GAUZE ELAST 4 BULKY (GAUZE/BANDAGES/DRESSINGS) ×2 IMPLANT
CLOSURE WOUND 1/2 X4 (GAUZE/BANDAGES/DRESSINGS)
COVER SURGICAL LIGHT HANDLE (MISCELLANEOUS) ×3 IMPLANT
DECANTER SPIKE VIAL GLASS SM (MISCELLANEOUS) ×1 IMPLANT
DRAIN PENROSE 18X1/2 LTX STRL (DRAIN) ×1 IMPLANT
DRAPE LAPAROTOMY TRNSV 102X78 (DRAPE) ×3 IMPLANT
ELECT PENCIL ROCKER SW 15FT (MISCELLANEOUS) ×3 IMPLANT
ELECT REM PT RETURN 9FT ADLT (ELECTROSURGICAL) ×3
ELECTRODE REM PT RTRN 9FT ADLT (ELECTROSURGICAL) ×1 IMPLANT
GAUZE SPONGE 4X4 12PLY STRL (GAUZE/BANDAGES/DRESSINGS) ×3 IMPLANT
GLOVE BIOGEL PI IND STRL 7.0 (GLOVE) ×1 IMPLANT
GLOVE BIOGEL PI INDICATOR 7.0 (GLOVE) ×2
GLOVE SURG SIGNA 7.5 PF LTX (GLOVE) ×3 IMPLANT
GOWN SPEC L4 XLG W/TWL (GOWN DISPOSABLE) ×3 IMPLANT
GOWN STRL REUS W/ TWL XL LVL3 (GOWN DISPOSABLE) ×3 IMPLANT
GOWN STRL REUS W/TWL LRG LVL3 (GOWN DISPOSABLE) ×3 IMPLANT
GOWN STRL REUS W/TWL XL LVL3 (GOWN DISPOSABLE) ×9
KIT BASIN OR (CUSTOM PROCEDURE TRAY) ×3 IMPLANT
NDL HYPO 25X1 1.5 SAFETY (NEEDLE) ×1 IMPLANT
NEEDLE HYPO 25X1 1.5 SAFETY (NEEDLE) ×3 IMPLANT
NS IRRIG 1000ML POUR BTL (IV SOLUTION) ×3 IMPLANT
PACK BASIC VI WITH GOWN DISP (CUSTOM PROCEDURE TRAY) ×3 IMPLANT
SPONGE LAP 4X18 X RAY DECT (DISPOSABLE) ×2 IMPLANT
STRIP CLOSURE SKIN 1/2X4 (GAUZE/BANDAGES/DRESSINGS) ×1 IMPLANT
SYR BULB IRRIGATION 50ML (SYRINGE) ×3 IMPLANT
SYR CONTROL 10ML LL (SYRINGE) ×3 IMPLANT
TOWEL OR 17X26 10 PK STRL BLUE (TOWEL DISPOSABLE) ×3 IMPLANT
YANKAUER SUCT BULB TIP 10FT TU (MISCELLANEOUS) ×3 IMPLANT

## 2015-08-09 NOTE — Transfer of Care (Signed)
Immediate Anesthesia Transfer of Care Note  Patient: Beth Gibson  Procedure(s) Performed: Procedure(s): IRRIGATION AND DEBRIDEMENT AXILLARY ABSCESS (Right)  Patient Location: PACU  Anesthesia Type:General  Level of Consciousness:  sedated, patient cooperative and responds to stimulation  Airway & Oxygen Therapy:Patient Spontanous Breathing and Patient connected to face mask oxgen  Post-op Assessment:  Report given to PACU RN and Post -op Vital signs reviewed and stable  Post vital signs:  Reviewed and stable  Last Vitals:  Filed Vitals:   08/09/15 1604  BP:   Pulse: 96  Temp:   Resp:     Complications: No apparent anesthesia complications

## 2015-08-09 NOTE — ED Provider Notes (Signed)
CSN: [id number]     Arrival date & time 08/09/15  0903 History   First MD Initiated Contact with Patient 08/09/15 [phone]     Chief Complaint  Patient presents with  . Abscess  . Generalized Body Aches  . Emesis     (Consider location/radiation/quality/duration/timing/severity/associated sxs/prior Treatment) HPI Beth Gibson is a 29 y.o. female history of multiple abscesses, presents to emergency department complaining of body aches, chills, fever yesterday, abscess to the right axilla. States symptoms started 3 days ago. She has been taking ibuprofen which has not helped her. She states the pain is all over, neck, back, arms, legs. She states last time she had an abscessshe had to be taken to surgery. Patient has not taken anything prior to coming in this morning. Denies any URI symptoms. No nausea, vomiting. No diarrhea. No urinary symptoms. Denies any cough or congestion. No chest pain or abdominal pain.  Past Medical History  Diagnosis Date  . Chlamydia   . Trichomonas   . Preterm labor   . Headache(784.0)   . UTI (lower urinary tract infection)   . BV (bacterial vaginosis)   . Yeast vaginitis   . Abscess of axilla, right    Past Surgical History  Procedure Laterality Date  . Cyst removed from l buttock    . Cesarean section    . Wisdom tooth extraction    . Wisdom tooth extraction  2009  . Cystectomy  2009    left buttock  . Cesarean section  04/18/2012    Procedure: CESAREAN SECTION;  Surgeon: Guss Bunde, MD;  Location: [facility];  Service: Gynecology;  Laterality: N/A;  repeat  . Incision and drainage abscess Right 11/01/2014    Procedure: INCISION AND DRAINAGE RIGHT AXILLARY ABSCESS;  Surgeon: Leighton Ruff, MD;  Location: [facility];  Service: General;  Laterality: Right;   Family History  Problem Relation Age of Onset  . Hypertension Mother   . Diabetes Mellitus I Father    Social History  Substance Use Topics  . Smoking status: Never Smoker   . Smokeless tobacco:  Never Used  . Alcohol Use: 0.5 oz/week    1 Standard drinks or equivalent per week     Comment: "special occasions"   OB History    Gravida Para Term Preterm AB TAB SAB Ectopic Multiple Living   3 2 0 2 1 0 1 0 0 2      Review of Systems  Constitutional: Positive for fever and chills.  Respiratory: Negative for cough, chest tightness and shortness of breath.   Cardiovascular: Negative for chest pain, palpitations and leg swelling.  Gastrointestinal: Negative for nausea, vomiting, abdominal pain and diarrhea.  Genitourinary: Negative for dysuria and flank pain.  Musculoskeletal: Positive for myalgias and arthralgias. Negative for neck pain and neck stiffness.  Skin: Positive for wound. Negative for rash.  Neurological: Negative for dizziness, weakness and headaches.  All other systems reviewed and are negative.     Allergies  Vicodin  Home Medications   Prior to Admission medications   Medication Sig Start Date End Date Taking? Authorizing Provider  ibuprofen (ADVIL,MOTRIN) 800 MG tablet Take 1,600 mg by mouth every 8 (eight) hours as needed for moderate pain.   Yes Historical Provider, MD  amoxicillin (AMOXIL) 500 MG capsule Take 2 capsules (1,000 mg total) by mouth 2 (two) times daily. Patient not taking: Reported on 08/09/2015 02/14/15   Janne Napoleon, NP  doxycycline (VIBRAMYCIN) 100 MG capsule Take 1 capsule (100 mg  total) by mouth 2 (two) times daily. One po bid x 7 days Patient not taking: Reported on 08/09/2015 01/04/15   Mercedes Camprubi-Soms, PA-C  ferrous sulfate 325 (65 FE) MG tablet Take 1 tablet (325 mg total) by mouth 3 (three) times daily with meals. Patient not taking: Reported on 08/09/2015 11/03/14   Rolm Bookbinder, MD  metroNIDAZOLE (FLAGYL) 500 MG tablet Take 1 tablet (500 mg total) by mouth 2 (two) times daily. X 7 days Patient not taking: Reported on 08/09/2015 02/14/15   Janne Napoleon, NP  naproxen (NAPROSYN) 500 MG tablet Take 1 tablet (500 mg total) by mouth 2  (two) times daily as needed for mild pain, moderate pain or headache (TAKE WITH MEALS.). Patient not taking: Reported on 08/09/2015 01/04/15   Mercedes Camprubi-Soms, PA-C  oxyCODONE-acetaminophen (PERCOCET/ROXICET) 5-325 MG per tablet Take 1 tablet by mouth every 6 (six) hours as needed for severe pain. Patient not taking: Reported on 08/09/2015 02/14/15   Janne Napoleon, NP   BP 134/87 mmHg  Pulse 106  Temp(Src) 99.2 F (37.3 C) (Oral)  Resp 17  SpO2 100%  LMP 08/06/2015 (Exact Date) Physical Exam  Constitutional: She is oriented to person, place, and time. She appears well-developed and well-nourished. No distress.  HENT:  Head: Normocephalic.  Mouth/Throat: Oropharynx is clear and moist.  Eyes: Conjunctivae are normal.  Neck: Neck supple.  Cardiovascular: Normal rate, regular rhythm and normal heart sounds.   Pulmonary/Chest: Effort normal and breath sounds normal. No respiratory distress. She has no wheezes. She has no rales.  Abdominal: Soft. Bowel sounds are normal. She exhibits no distension. There is no tenderness. There is no rebound.  Musculoskeletal: She exhibits no edema.  Neurological: She is alert and oriented to person, place, and time.  Skin: Skin is warm and dry.  Large abscess to the right axilla, indurated, erythematous, tender to palpation. Mild surrounding erythema suggestive of cellulitis.  Psychiatric: She has a normal mood and affect. Her behavior is normal.  Nursing note and vitals reviewed.   ED Course  Procedures (including critical care time) Labs Review Labs Reviewed  CBC WITH DIFFERENTIAL/PLATELET - Abnormal; Notable for the following:    Hemoglobin 8.2 (*)    HCT 27.5 (*)    MCV 59.9 (*)    MCH 17.9 (*)    MCHC 29.8 (*)    RDW 20.9 (*)    Neutrophils Relative % 80 (*)    All other components within normal limits  BASIC METABOLIC PANEL - Abnormal; Notable for the following:    BUN <5 (*)    All other components within normal limits  URINALYSIS,  ROUTINE W REFLEX MICROSCOPIC (NOT AT [facility]) - Abnormal; Notable for the following:    APPearance CLOUDY (*)    Hgb urine dipstick MODERATE (*)    All other components within normal limits  URINE MICROSCOPIC-ADD ON  I-STAT CG4 LACTIC ACID, ED  POC URINE PREG, ED    Imaging Review No results found. I have personally reviewed and evaluated these images and lab results as part of my medical decision-making.   EKG Interpretation None      MDM   Final diagnoses:  Abscess of right axilla      Pt with right axilla abscess, fever, chills. Vs normal here. No evidence of sepsis.  Pt is refusing I&D by ED staff. Requesting surgery. Pt had to have surgical I&D last time she was here.   Spoke with general surgery, will come by and see. Did not start  antibiotics in case they want to get cultures. Hemodynamically stable.   Filed Vitals:   08/09/15 1601 08/09/15 1602 08/09/15 1603 08/09/15 1604  BP:      Pulse: 95 91 91 96  Temp:      TempSrc:      Resp:      SpO2: 100% 100% 98% 97%     Jeannett Senior, PA-C 08/09/15 [place], DO 08/11/15 1427

## 2015-08-09 NOTE — Progress Notes (Signed)
pcp is [facility] [address] [place], [place] [address] [phone]

## 2015-08-09 NOTE — Anesthesia Procedure Notes (Signed)
Procedure Name: LMA Insertion Date/Time: 08/09/2015 4:26 PM Performed by: Lajuana Carry E Pre-anesthesia Checklist: Patient identified, Emergency Drugs available, Suction available and Patient being monitored Patient Re-evaluated:Patient Re-evaluated prior to inductionOxygen Delivery Method: Circle system utilized Preoxygenation: Pre-oxygenation with 100% oxygen Intubation Type: IV induction Ventilation: Mask ventilation without difficulty LMA: LMA inserted LMA Size: 4.0 Number of attempts: 1 Placement Confirmation: positive ETCO2 and breath sounds checked- equal and bilateral Dental Injury: Teeth and Oropharynx as per pre-operative assessment

## 2015-08-09 NOTE — Op Note (Signed)
08/09/2015  4:54 PM  PATIENT:  Beth Gibson, 29 y.o., female, MRN: [id number]  PREOP DIAGNOSIS:  right axillary abcess  POSTOP DIAGNOSIS:   Right axillary abscess, approx 5 x 6 cm.  PROCEDURE:   Procedure(s): IRRIGATION AND DEBRIDEMENT AXILLARY ABSCESS  SURGEON:   Alphonsa Overall, M.D.  ANESTHESIA:   general  Anesthesiologist: Franne Grip, MD CRNA: Verlin Grills, CRNA  General  EBL:  75  ml  DRAINS: wound packed with betadine curlex  LOCAL MEDICATIONS USED:   none  SPECIMEN:   Cultures sent for C&S  COUNTS CORRECT:  YES  INDICATIONS FOR PROCEDURE:  [name] is a 29 y.o. (DOB: [birth year]) AA  female whose primary care physician is PROVIDER NOT IN SYSTEM and comes for I&D of right axillary abscess.   She had a right axillary abscess I&Ded on 11/01/2104 by Dr. Joyice Faster.  I appears that this abscess has recurred.  She presented to [facility] today with right axillary pain.  She was in the hospital x 2 days last time.  Her last culture showed Proteus Mirabilis   The indications and risks of the surgery were explained to the patient.  The risks include, but are not limited to, infection, bleeding, and nerve injury.  PROCEDURE:  The patient was taken to room#2 and underwent a general anesthesia with LMA.  Her right axilla was prepped with choloprep.   A time out was held and the surgical checklist run.   I made an incision into the right axilla and into a 5 x 6 cm abscess.  Cultures were obtained.  I irrigated the wound with 800 cc of saline.  I packed the wound with betadine soaked curlex.  And then sterilely dressed the wound.   The patient tolerated the procedure well.  Will keep overnight for pain control and to assist with first dressing change.  Alphonsa Overall, MD, [facility] Surgery Pager: [phone] Office phone:  [phone]

## 2015-08-09 NOTE — ED Notes (Signed)
Pt reports left axilla abscess x3 days, today abscess got bigger, hx of abscess and had to have surgery the last time. Pt reports started having hot/cold chills since yesterday. Vomited 2x yesterday. Reports all over body aches 10/10. Reports back, neck, face, arms, and legs hurt. Denies dysuria. Reports abd cramping on sides and pelvic area- pt on menstrual cycle- but reports this pain is not normal pain. No relief with OTC meds or heat packs.

## 2015-08-09 NOTE — ED Notes (Signed)
Pt had questions regarding procedure. Left unsigned consent form at bedside.

## 2015-08-09 NOTE — ED Notes (Addendum)
Surgery at bedside.

## 2015-08-09 NOTE — ED Notes (Signed)
This Agricultural consultant spoke w/ OR.  They will be coming to get her within 30 mins.  Staff made aware.

## 2015-08-09 NOTE — H&P (Addendum)
Chief Complaint: right axillary abscess  HPI: Beth Gibson is a 29 year old female with a history of axillary abscesses last I&D being in November 2015 who presents with 3 days of pain and fevers of 102.  Initially started as a small "knot" and has gradually increase in size.  Associated with malaise, dizziness, chills and sweats.  She denies history of hidradenitis or a family history of hidradenitis.    She reports having drainage to the left axillae, but no pain or erythema.  Also, had an I&D for an abscess on "butt crack."   Reports her pain is severe.  Time pattern is constant.  Modifying factors include ibuprofen and warm compresses.  Aggravated by movement.  No alleviating factors.   ED work up shows a normal white count and found to have a large tender abscess by exam.  We have therefore been asked to evaluate.   NPO since 7AM  Past Medical History  Diagnosis Date  . Chlamydia   . Trichomonas   . Preterm labor   . Headache(784.0)   . UTI (lower urinary tract infection)   . BV (bacterial vaginosis)   . Yeast vaginitis   . Abscess of axilla, right     Past Surgical History  Procedure Laterality Date  . Cyst removed from l buttock    . Cesarean section    . Wisdom tooth extraction    . Wisdom tooth extraction  2009  . Cystectomy  2009    left buttock  . Cesarean section  04/18/2012    Procedure: CESAREAN SECTION;  Surgeon: Guss Bunde, MD;  Location: [facility];  Service: Gynecology;  Laterality: N/A;  repeat  . Incision and drainage abscess Right 11/01/2014    Procedure: INCISION AND DRAINAGE RIGHT AXILLARY ABSCESS;  Surgeon: Leighton Ruff, MD;  Location: [facility];  Service: General;  Laterality: Right;    Family History  Problem Relation Age of Onset  . Hypertension Mother   . Diabetes Mellitus I Father    Social History:  reports that she has never smoked. She has never used smokeless tobacco. She reports that she drinks about 0.5 oz of alcohol per week. She reports  that she does not use illicit drugs.  Allergies:  Allergies  Allergen Reactions  . Vicodin [Hydrocodone-Acetaminophen] Itching   Medication: Does not take any meds  (Not in a hospital admission)  Results for orders placed or performed during the hospital encounter of 08/09/15 (from the past 48 hour(s))  Urinalysis, Routine w reflex microscopic (not at [facility])     Status: Abnormal   Collection Time: 08/09/15  9:52 AM  Result Value Ref Range   Color, Urine YELLOW YELLOW   APPearance CLOUDY (A) CLEAR   Specific Gravity, Urine 1.021 1.005 - 1.030   pH 5.5 5.0 - 8.0   Glucose, UA NEGATIVE NEGATIVE mg/dL   Hgb urine dipstick MODERATE (A) NEGATIVE   Bilirubin Urine NEGATIVE NEGATIVE   Ketones, ur NEGATIVE NEGATIVE mg/dL   Protein, ur NEGATIVE NEGATIVE mg/dL   Urobilinogen, UA 1.0 0.0 - 1.0 mg/dL   Nitrite NEGATIVE NEGATIVE   Leukocytes, UA NEGATIVE NEGATIVE  Urine microscopic-add on     Status: None   Collection Time: 08/09/15  9:52 AM  Result Value Ref Range   Squamous Epithelial / LPF RARE RARE   RBC / HPF 7-10 <3 RBC/hpf   Bacteria, UA RARE RARE   Urine-Other MUCOUS PRESENT   POC Urine Pregnancy, ED (do NOT order at [facility])  Status: None   Collection Time: 08/09/15  9:59 AM  Result Value Ref Range   Preg Test, Ur NEGATIVE NEGATIVE    Comment:        THE SENSITIVITY OF THIS METHODOLOGY IS >24 mIU/mL   CBC with Differential     Status: Abnormal   Collection Time: 08/09/15 10:07 AM  Result Value Ref Range   WBC 8.6 4.0 - 10.5 K/uL   RBC 4.59 3.87 - 5.11 MIL/uL   Hemoglobin 8.2 (L) 12.0 - 15.0 g/dL   HCT 27.5 (L) 36.0 - 46.0 %   MCV 59.9 (L) 78.0 - 100.0 fL   MCH 17.9 (L) 26.0 - 34.0 pg   MCHC 29.8 (L) 30.0 - 36.0 g/dL   RDW 20.9 (H) 11.5 - 15.5 %   Platelets 211 150 - 400 K/uL   Neutrophils Relative % 80 (H) 43 - 77 %   Lymphocytes Relative 13 12 - 46 %   Monocytes Relative 6 3 - 12 %   Eosinophils Relative 1 0 - 5 %   Basophils Relative 0 0 - 1 %   Neutro Abs 6.9  1.7 - 7.7 K/uL   Lymphs Abs 1.1 0.7 - 4.0 K/uL   Monocytes Absolute 0.5 0.1 - 1.0 K/uL   Eosinophils Absolute 0.1 0.0 - 0.7 K/uL   Basophils Absolute 0.0 0.0 - 0.1 K/uL   RBC Morphology POLYCHROMASIA PRESENT     Comment: TEARDROP CELLS  Basic metabolic panel     Status: Abnormal   Collection Time: 08/09/15 10:07 AM  Result Value Ref Range   Sodium 136 135 - 145 mmol/L   Potassium 3.9 3.5 - 5.1 mmol/L   Chloride 106 101 - 111 mmol/L   CO2 24 22 - 32 mmol/L   Glucose, Bld 98 65 - 99 mg/dL   BUN <5 (L) 6 - 20 mg/dL   Creatinine, Ser 0.71 0.44 - 1.00 mg/dL   Calcium 8.9 8.9 - 10.3 mg/dL   GFR calc non Af Amer >60 >60 mL/min   GFR calc Af Amer >60 >60 mL/min    Comment: (NOTE) The eGFR has been calculated using the CKD EPI equation. This calculation has not been validated in all clinical situations. eGFR's persistently <60 mL/min signify possible Chronic Kidney Disease.    Anion gap 6 5 - 15  I-Stat CG4 Lactic Acid, ED     Status: None   Collection Time: 08/09/15 10:16 AM  Result Value Ref Range   Lactic Acid, Venous 0.59 0.5 - 2.0 mmol/L   No results found.  Review of Systems  All other systems reviewed and are negative.   Blood pressure 118/72, pulse 93, temperature 99.2 F (37.3 C), temperature source Oral, resp. rate 16, last menstrual period 08/06/2015, SpO2 98 %. Physical Exam  Constitutional: She is oriented to person, place, and time. She appears well-developed and well-nourished. No distress.  Cardiovascular: Normal rate, regular rhythm, normal heart sounds and intact distal pulses.  Exam reveals no gallop and no friction rub.   No murmur heard. Respiratory: Effort normal and breath sounds normal. No respiratory distress. She has no wheezes. She has no rales.  GI: Soft. Bowel sounds are normal. She exhibits no distension and no mass. There is no tenderness. There is no rebound and no guarding.  Neurological: She is alert and oriented to person, place, and time.   Skin: She is not diaphoretic.  5x10cm area of induration central fluctuance and tenderness to the right axillae.  No significant erythema.  Left axillae-small sinus tract with yellow drainage, but no abscesses.  Psychiatric: She has a normal mood and affect. Her behavior is normal. Judgment and thought content normal.     Assessment/Plan Right axillary abscess-to OR for drainage later today.    Obtain consent, clindamycin and keep NPO.   She appears to have hidradenitis.  Discussed supportive therapy and encouraged her to stop smoking. VTE prophylaxis-SCD, post op lovenox  FEN-IVF, pain control Dispo-to OR  RIEBOCK, EMINA ANP-BC 08/09/2015, 12:06 PM  Agree with above. I have also stressed quitting smoking.  She had the right axillary abscess I&Ded on 11/01/2104 by Dr. Joyice Faster.  It appears that this abscess has recurred.  She was in the hospital x 2 days last time.  Her last culture showed Proteus Mirabilis.  To I&D in OR later today.  Indications and risks of surgery explained to the patient.  Has two children - 3 and 28 yo. Will probably get her mother to help her post op. Will get [facility] to help with wound care.  Alphonsa Overall, MD, [facility] Pager: [phone] Office phone:  [phone]

## 2015-08-09 NOTE — ED Notes (Signed)
Case management at bedside.

## 2015-08-09 NOTE — ED Notes (Signed)
MD at bedside. 

## 2015-08-09 NOTE — Anesthesia Postprocedure Evaluation (Signed)
  Anesthesia Post-op Note  Patient: Beth Gibson  Procedure(s) Performed: Procedure(s) (LRB): IRRIGATION AND DEBRIDEMENT AXILLARY ABSCESS (Right)  Patient Location: PACU  Anesthesia Type: General  Level of Consciousness: awake and alert   Airway and Oxygen Therapy: Patient Spontanous Breathing  Post-op Pain: mild  Post-op Assessment: Post-op Vital signs reviewed, Patient's Cardiovascular Status Stable, Respiratory Function Stable, Patent Airway and No signs of Nausea or vomiting  Last Vitals:  Filed Vitals:   08/09/15 1800  BP: 146/81  Pulse: 95  Temp: 37.3 C  Resp: 14    Post-op Vital Signs: stable   Complications: No apparent anesthesia complications

## 2015-08-09 NOTE — Progress Notes (Signed)
29 yr old female medicaid pt orders for home health RN for wound care. ED CM spoke with [place] [facility] to provide home health referral after speaking with pt to offer her a list of home health agencies for choice Pt states she had a procedure in 2015 and needed assistance so she used [facility] and would to use the same [facility] if possible When CM was leaving pt room the OR staff was coming to take her for I&D Pt given [facility] contact information

## 2015-08-09 NOTE — Anesthesia Preprocedure Evaluation (Addendum)
Anesthesia Evaluation  Patient identified by MRN, date of birth, ID band Patient awake    Reviewed: Allergy & Precautions, NPO status , Patient's Chart, lab work & pertinent test results  History of Anesthesia Complications Negative for: history of anesthetic complications  Airway Mallampati: III  TM Distance: >3 FB Neck ROM: Full    Dental  (+) Teeth Intact   Pulmonary neg pulmonary ROS,  breath sounds clear to auscultation  Pulmonary exam normal       Cardiovascular Exercise Tolerance: Good - anginanegative cardio ROS Normal cardiovascular examRhythm:Regular Rate:Normal     Neuro/Psych  Headaches, negative psych ROS   GI/Hepatic negative GI ROS, Neg liver ROS,   Endo/Other  obesity  Renal/GU negative Renal ROS     Musculoskeletal negative musculoskeletal ROS (+)   Abdominal   Peds  Hematology  (+) Blood dyscrasia, anemia ,   Anesthesia Other Findings Day of surgery medications reviewed with the patient.  Reproductive/Obstetrics                            Anesthesia Physical Anesthesia Plan  ASA: II  Anesthesia Plan: General   Post-op Pain Management:    Induction: Intravenous  Airway Management Planned: Oral ETT and LMA  Additional Equipment:   Intra-op Plan:   Post-operative Plan: Extubation in OR  Informed Consent: I have reviewed the patients History and Physical, chart, labs and discussed the procedure including the risks, benefits and alternatives for the proposed anesthesia with the patient or authorized representative who has indicated his/her understanding and acceptance.   Dental advisory given  Plan Discussed with: CRNA  Anesthesia Plan Comments: (Risks/benefits of general anesthesia discussed with patient including risk of damage to teeth, lips, gum, and tongue, nausea/vomiting, allergic reactions to medications, and the possibility of heart attack, stroke and  death.  All patient questions answered.  Patient wishes to proceed.)        Anesthesia Quick Evaluation

## 2015-08-10 MED ORDER — DOXYCYCLINE HYCLATE 100 MG PO TABS: 100.0000 mg | ORAL_TABLET | Freq: Two times a day (BID) | ORAL | Status: DC

## 2015-08-10 MED ORDER — OXYCODONE HCL 5 MG PO TABS: 5.0000 mg | ORAL_TABLET | ORAL | Status: DC | PRN

## 2015-08-10 MED ORDER — DIPHENHYDRAMINE HCL 25 MG PO CAPS
25.0000 mg | ORAL_CAPSULE | ORAL | Status: DC | PRN
Start: 2015-08-10 — End: 2015-08-10
  Administered 2015-08-10: 25 mg via ORAL
  Filled 2015-08-10: qty 1

## 2015-08-10 NOTE — Discharge Instructions (Addendum)
Home health nurses with come out Monday and assist with dressing changes.  Call our office if you have heard from those nurses by Monday morning.  Call for heavy bleeding.  Take medication as directed.  Take an over the counter probiotic as long as you are taking the antibiotic.  Avoid used deodorants or anti-perspirants under your arms.  Appointment with your surgeon, Dr. Lucia Gaskins, in 2 weeks.  Call [phone] to make appt.  You may return to work on 08/19/15

## 2015-08-10 NOTE — Progress Notes (Signed)
1 Day Post-Op  Subjective: Right axillary soreness.  Objective: Vital signs in last 24 hours: Temp:  [97.9 F (36.6 C)-99.2 F (37.3 C)] 97.9 F (36.6 C) (08/20 0550) Pulse Rate:  [71-114] 114 (08/20 0550) Resp:  [13-21] 16 (08/20 0550) BP: (108-146)/(57-81) 121/57 mmHg (08/20 0550) SpO2:  [94 %-100 %] 98 % (08/20 0550) Weight:  [94.348 kg (208 lb)] 94.348 kg (208 lb) (08/19 1926) Last BM Date: 08/07/15  Intake/Output from previous day: 08/19 0701 - 08/20 0700 In: 1738.8 [I.V.:1738.8] Out: 400 [Urine:400] Intake/Output this shift:    PE: General- In NAD Right axillary wound clean, redressed  Lab Results:   Recent Labs  08/09/15 1007  WBC 8.6  HGB 8.2*  HCT 27.5*  PLT 211   BMET  Recent Labs  08/09/15 1007  NA 136  K 3.9  CL 106  CO2 24  GLUCOSE 98  BUN <5*  CREATININE 0.71  CALCIUM 8.9   PT/INR No results for input(s): LABPROT, INR in the last 72 hours. Comprehensive Metabolic Panel:    Component Value Date/Time   NA 136 08/09/2015 1007   NA 137 11/03/2014 0528   NA 135* 09/05/2014 2343   K 3.9 08/09/2015 1007   K 4.1 11/03/2014 0528   K 3.7 09/05/2014 2343   CL 106 08/09/2015 1007   CL 100 11/03/2014 0528   CL 103 09/05/2014 2343   CO2 24 08/09/2015 1007   CO2 28 11/03/2014 0528   CO2 26 09/05/2014 2343   BUN <5* 08/09/2015 1007   BUN 6 11/03/2014 0528   BUN 8 09/05/2014 2343   CREATININE 0.71 08/09/2015 1007   CREATININE 0.74 11/03/2014 0528   CREATININE 0.64 09/05/2014 2343   GLUCOSE 98 08/09/2015 1007   GLUCOSE 120* 11/03/2014 0528   GLUCOSE 100* 09/05/2014 2343   CALCIUM 8.9 08/09/2015 1007   CALCIUM 9.1 11/03/2014 0528   CALCIUM 8.5 09/05/2014 2343   AST 14 11/01/2014 0815   AST 13* 09/05/2014 2343   AST 18 11/22/2010 2229   ALT 10 11/01/2014 0815   ALT 12* 09/05/2014 2343   ALT 10 11/22/2010 2229   ALKPHOS 49 11/01/2014 0815   ALKPHOS 43* 09/05/2014 2343   ALKPHOS 52 11/22/2010 2229   BILITOT 0.4 11/01/2014 0815   BILITOT 0.4 09/05/2014 2343   BILITOT 0.3 11/22/2010 2229   PROT 8.1 11/01/2014 0815   PROT 8.4* 09/05/2014 2343   PROT 7.1 11/22/2010 2229   ALBUMIN 3.2* 11/01/2014 0815   ALBUMIN 3.7 09/05/2014 2343   ALBUMIN 3.3* 11/22/2010 2229     Studies/Results: No results found.  Anti-infectives: Anti-infectives    Start     Dose/Rate Route Frequency Ordered Stop   08/09/15 1815  piperacillin-tazobactam (ZOSYN) IVPB 3.375 g     3.375 g 12.5 mL/hr over 240 Minutes Intravenous Every 8 hours 08/09/15 1806     08/09/15 1215  clindamycin (CLEOCIN) IVPB 600 mg  Status:  Discontinued     600 mg 100 mL/hr over 30 Minutes Intravenous 4 times per day 08/09/15 1205 08/09/15 1806      Assessment Active Problems:    Right axillary abscess s/p I & D (Dr. Lucia Gaskins) on 8/19-doing well      Plan: Discharge.  Start [facility] for dressing changes on 8/20.  Counseling for possible hidradenitis suppurative given to her.  Follow up with Dr.  Lucia Gaskins in 2 weeks.   Sajid Ruppert Lenna Sciara 08/10/2015

## 2015-08-10 NOTE — Progress Notes (Signed)
Patient given discharge, medications, and f/u instructions, verbalized understanding, prescription given to pt, IV removed, pt is transporting herself home.

## 2015-08-10 NOTE — Discharge Summary (Signed)
Physician Discharge Summary  Patient ID: Beth Gibson MRN: [id number] DOB/AGE: [birth year] 29 y.o.  Admit date: 08/09/2015 Discharge date: 08/10/2015  Admission Diagnoses:  Right axillary abscess  Discharge Diagnoses:  Active Problems:   Axillary abscess s/p incision and drainage 08/09/15   Discharged Condition: good  Hospital Course: She was admitted, started on IV abxs and underwent the above procedure.  On POD #1 she tolerated the dressing change, her wound looked good and she was able to be discharge.  Discharge instructions were given to her.  HHRN will start dressing changes on 8/20.     Discharge Exam: Blood pressure 121/57, pulse 114, temperature 97.9 F (36.6 C), temperature source Oral, resp. rate 16, height 5\' 7"  (1.702 m), weight 94.348 kg (208 lb), last menstrual period 08/06/2015, SpO2 98 %.   Disposition: 01-Home or Self Care  Discharge Instructions    Ambulatory referral to Home Health    Complete by:  As directed   Please evaluate Lam Bjorklund Jayne for admission to [facility].  Disciplines requested: Nursing  Services to provide: [facility] Care  Physician to follow patient's care (the person listed here will be responsible for signing ongoing orders): Referring Provider  Requested Start of Care Date: Within 2 days.  I certify that this patient is under my care and that I, or a Nurse Practitioner or Physician's Assistant working with me, had a face-to-face encounter that meets the physician face-to-face requirements with patient on 08/10/15. The encounter with the patient was in whole, or in part for the following medical condition(s) which is the primary reason for home health care (List medical condition). Open right axillary wound.  Needs daily normal saline damp to dry dressing change.  Special Instructions:  Dressing change as above.  Does the patient have Medicare or Medicaid?:  Yes  The encounter with the patient was in whole, or in part, for the following  medical condition, which is the primary reason for home health care:  right axillary abscess  Reason for Medically Necessary Home Health Services:  Skilled Nursing- Post-Surgical Wound Assessment and Care  My clinical findings support the need for the above services:  OTHER SEE COMMENTS  I certify that, based on my findings, the following services are medically necessary home health services:  Nursing  Further, I certify that my clinical findings support that this patient is homebound due to:  Unable to leave home safely without assistance            Medication List    STOP taking these medications        amoxicillin 500 MG capsule  Commonly known as:  AMOXIL     doxycycline 100 MG capsule  Commonly known as:  VIBRAMYCIN  Replaced by:  doxycycline 100 MG tablet     ferrous sulfate 325 (65 FE) MG tablet     metroNIDAZOLE 500 MG tablet  Commonly known as:  FLAGYL     naproxen 500 MG tablet  Commonly known as:  NAPROSYN     oxyCODONE-acetaminophen 5-325 MG per tablet  Commonly known as:  PERCOCET/ROXICET      TAKE these medications        doxycycline 100 MG tablet  Commonly known as:  VIBRA-TABS  Take 1 tablet (100 mg total) by mouth 2 (two) times daily.     ibuprofen 800 MG tablet  Commonly known as:  ADVIL,MOTRIN  Take 1,600 mg by mouth every 8 (eight) hours as needed for moderate pain.  oxyCODONE 5 MG immediate release tablet  Commonly known as:  Oxy IR/ROXICODONE  Take 1-2 tablets (5-10 mg total) by mouth every 4 (four) hours as needed for moderate pain, severe pain or breakthrough pain.           Follow-up Information    Follow up with Public health Dept. Schedule an appointment as soon as possible for a visit on 08/09/2015.   Why:  This is your [facility] access assigned doctor If you prefer another doctor contact DSS [phone]   Contact information:   pcp is [facility] [address] [place], [place] [phone] [phone]         Follow up with [facility] On 08/09/2015.   Why:  A referral for home health nurse wound care has been made for you. You should be contacted by [facility] but may call them if no response in 24 hours    Contact information:   [address] [place] [address] [phone]       Signed: Odis Hollingshead 08/10/2015, 9:25 AM

## 2015-08-10 NOTE — Care Management Note (Signed)
Case Management Note  Patient Details  Name: Beth Gibson MRN: [id number] Date of Birth: [birth year]  Subjective/Objective:           right axilla abscess        Action/Plan: Home Health RN  Expected Discharge Date:  08/10/2015               Expected Discharge Plan:  [place]  In-House Referral:     Discharge planning Services  CM Consult  Post Acute Care Choice:  Home Health Choice offered to:  Patient  DME Arranged:    DME Agency:     HH Arranged:  RN [place] Agency:  [facility]  Status of Service:  Completed, signed off  Medicare Important Message Given:    Date Medicare IM Given:    Medicare IM give by:    Date Additional Medicare IM Given:    Additional Medicare Important Message give by:     If discussed at [place] of Stay Meetings, dates discussed:    Additional Comments: Notified [facility] of scheduled dc home today with [facility] RN. Please see previous CM notes.  Erenest Rasher, RN 08/10/2015, 10:17 AM

## 2015-08-12 ENCOUNTER — Encounter (HOSPITAL_COMMUNITY): Payer: Self-pay | Admitting: Surgery

## 2015-08-14 LAB — ANAEROBIC CULTURE: GRAM STAIN: NONE SEEN

## 2015-08-14 LAB — CULTURE, ROUTINE-ABSCESS: Culture: NO GROWTH

## 2015-08-15 LAB — CULTURE, BLOOD (SINGLE): CULTURE: NO GROWTH

## 2015-10-12 ENCOUNTER — Encounter (HOSPITAL_COMMUNITY): Payer: Self-pay | Admitting: Emergency Medicine

## 2015-10-12 ENCOUNTER — Emergency Department (HOSPITAL_COMMUNITY)
Admission: EM | Admit: 2015-10-12 | Discharge: 2015-10-12 | Disposition: A | Discharge status: Home / Self Care | Payer: Medicaid Other | Source: Home / Self Care | Attending: Family Medicine | Admitting: Family Medicine

## 2015-10-12 DIAGNOSIS — H109 Unspecified conjunctivitis: Secondary | ICD-10-CM

## 2015-10-12 MED ORDER — POLYMYXIN B-TRIMETHOPRIM 10000-0.1 UNIT/ML-% OP SOLN: 1.0000 [drp] | OPHTHALMIC | Status: DC

## 2015-10-12 NOTE — ED Notes (Signed)
C/o left pink eye onset 3 days Sx include redness, discharge, and watery A&O x4... No acute distress.

## 2015-10-12 NOTE — ED Provider Notes (Signed)
CSN: [id number]     Arrival date & time 10/12/15  1550 History   First MD Initiated Contact with Patient 10/12/15 1713     Chief Complaint  Patient presents with  . Conjunctivitis   (Consider location/radiation/quality/duration/timing/severity/associated sxs/prior Treatment) HPI Comments: 29 year old female is complaining of left eye irritation this started approximately 3 days ago. She is now complaining of itchiness, redness, accommodation of watery and thick white discharge. It is worse in the morning. Denies any known injury or foreign body sensation.   Past Medical History  Diagnosis Date  . Chlamydia   . Trichomonas   . Preterm labor   . Headache(784.0)   . UTI (lower urinary tract infection)   . BV (bacterial vaginosis)   . Yeast vaginitis   . Abscess of axilla, right    Past Surgical History  Procedure Laterality Date  . Cyst removed from l buttock    . Cesarean section    . Wisdom tooth extraction    . Wisdom tooth extraction  2009  . Cystectomy  2009    left buttock  . Cesarean section  04/18/2012    Procedure: CESAREAN SECTION;  Surgeon: Guss Bunde, MD;  Location: [facility];  Service: Gynecology;  Laterality: N/A;  repeat  . Incision and drainage abscess Right 11/01/2014    Procedure: INCISION AND DRAINAGE RIGHT AXILLARY ABSCESS;  Surgeon: Leighton Ruff, MD;  Location: [facility];  Service: General;  Laterality: Right;  . Irrigation and debridement abscess Right 08/09/2015    Procedure: IRRIGATION AND DEBRIDEMENT AXILLARY ABSCESS;  Surgeon: Alphonsa Overall, MD;  Location: [facility];  Service: General;  Laterality: Right;   Family History  Problem Relation Age of Onset  . Hypertension Mother   . Diabetes Mellitus I Father    Social History  Substance Use Topics  . Smoking status: Never Smoker   . Smokeless tobacco: Never Used  . Alcohol Use: 0.5 oz/week    1 Standard drinks or equivalent per week     Comment: "special occasions"   OB History    Gravida Para Term  Preterm AB TAB SAB Ectopic Multiple Living   3 2 0 2 1 0 1 0 0 2      Review of Systems  Constitutional: Negative.   HENT: Negative.   Eyes: Positive for discharge, redness and itching. Negative for visual disturbance.  Respiratory: Negative.     Allergies  Vicodin  Home Medications   Prior to Admission medications   Medication Sig Start Date End Date Taking? Authorizing Provider  doxycycline (VIBRA-TABS) 100 MG tablet Take 1 tablet (100 mg total) by mouth 2 (two) times daily. 08/10/15   Jackolyn Confer, MD  ibuprofen (ADVIL,MOTRIN) 800 MG tablet Take 1,600 mg by mouth every 8 (eight) hours as needed for moderate pain.    Historical Provider, MD  oxyCODONE (OXY IR/ROXICODONE) 5 MG immediate release tablet Take 1-2 tablets (5-10 mg total) by mouth every 4 (four) hours as needed for moderate pain, severe pain or breakthrough pain. 08/10/15   Jackolyn Confer, MD  trimethoprim-polymyxin b (POLYTRIM) ophthalmic solution Place 1 drop into the left eye every 4 (four) hours. 10/12/15   Janne Napoleon, NP   Meds Ordered and Administered this Visit  Medications - No data to display  BP 126/73 mmHg  Pulse 88  Temp(Src) 98 F (36.7 C) (Oral)  Resp 18  SpO2 100%  LMP 10/10/2015 No data found.   Physical Exam  Constitutional: She appears well-developed. No distress.  Eyes: EOM are  normal. Pupils are equal, round, and reactive to light.  Conjunctiva mildly erythematous. Sclera mildly injected. No drainage is seen during the exam. Anterior chamber is clear. Pupil response is normal. No foreign body seen.  Neck: Normal range of motion. Neck supple.  Cardiovascular: Normal rate.   Pulmonary/Chest: Effort normal. No respiratory distress.  Neurological: She is alert. She exhibits normal muscle tone.  Skin: Skin is warm and dry.  Psychiatric: She has a normal mood and affect.  Nursing note and vitals reviewed.   ED Course  Procedures (including critical care time)  Labs Review Labs Reviewed  - No data to display  Imaging Review No results found.   Visual Acuity Review  Right Eye Distance:   Left Eye Distance:   Bilateral Distance:    Right Eye Near:   Left Eye Near:    Bilateral Near:         MDM   1. Conjunctivitis of left eye    Polytrim eyedrops as directed Pickup Zaditor at [employer] and use as directed Warm compresses frequently.    Janne Napoleon, NP 10/12/15 1743

## 2015-10-12 NOTE — Discharge Instructions (Signed)
How to Use Eye Drops and Eye Ointments  HOW TO APPLY EYE DROPS  Follow these steps when applying eye drops:  1. Wash your hands.  2. Tilt your head back.  3. Put a finger under your eye and use it to gently pull your lower lid downward. Keep that finger in place.  4. Using your other hand, hold the dropper between your thumb and index finger.  5. Position the dropper just over the edge of the lower lid. Hold it as close to your eye as you can without touching the dropper to your eye.  6. Steady your hand. One way to do this is to lean your index finger against your brow.  7. Look up.  8. Slowly and gently squeeze one drop of medicine into your eye.  9. Close your eye.  10. Place a finger between your lower eyelid and your nose. Press gently for 2 minutes. This increases the amount of time that the medicine is exposed to the eye. It also reduces side effects that can develop if the drop gets into the bloodstream through the nose.  HOW TO APPLY EYE OINTMENTS  Follow these steps when applying eye ointments:  1. Wash your hands.  2. Put a finger under your eye and use it to gently pull your lower lid downward. Keep that finger in place.  3. Using your other hand, place the tip of the tube between your thumb and index finger with the remaining fingers braced against your cheek or nose.  4. Hold the tube just over the edge of your lower lid without touching the tube to your lid or eyeball.  5. Look up.  6. Line the inner part of your lower lid with ointment.  7. Gently pull up on your upper lid and look down. This will force the ointment to spread over the surface of the eye.  8. Release the upper lid.  9. If you can, close your eyes for 1-2 minutes.  Do not rub your eyes. If you applied the ointment correctly, your vision will be blurry for a few minutes. This is normal.  ADDITIONAL INFORMATION   Make sure to use the eye drops or ointment as told by your health care provider.   If you have been told to use both eye  drops and an eye ointment, apply the eye drops first, then wait 3-4 minutes before you apply the ointment.   Try not to touch the tip of the dropper or tube to your eye. A dropper or tube that has touched the eye can become contaminated.     This information is not intended to replace advice given to you by your health care provider. Make sure you discuss any questions you have with your health care provider.     Document Released: 03/15/2001 Document Revised: 04/23/2015 Document Reviewed: 12/03/2014  Elsevier Interactive Patient Education 2016 Elsevier Inc.

## 2015-10-15 ENCOUNTER — Encounter (HOSPITAL_COMMUNITY): Payer: Self-pay | Admitting: *Deleted

## 2015-10-15 ENCOUNTER — Emergency Department (HOSPITAL_COMMUNITY)
Admission: EM | Admit: 2015-10-15 | Discharge: 2015-10-15 | Disposition: A | Discharge status: Home / Self Care | Payer: Medicaid Other | Attending: Emergency Medicine | Admitting: Emergency Medicine

## 2015-10-15 DIAGNOSIS — S40861A Insect bite (nonvenomous) of right upper arm, initial encounter: Secondary | ICD-10-CM | POA: Diagnosis not present

## 2015-10-15 DIAGNOSIS — Y999 Unspecified external cause status: Secondary | ICD-10-CM | POA: Insufficient documentation

## 2015-10-15 DIAGNOSIS — Z8619 Personal history of other infectious and parasitic diseases: Secondary | ICD-10-CM | POA: Insufficient documentation

## 2015-10-15 DIAGNOSIS — Z792 Long term (current) use of antibiotics: Secondary | ICD-10-CM | POA: Diagnosis not present

## 2015-10-15 DIAGNOSIS — Y929 Unspecified place or not applicable: Secondary | ICD-10-CM | POA: Diagnosis not present

## 2015-10-15 DIAGNOSIS — S40862A Insect bite (nonvenomous) of left upper arm, initial encounter: Secondary | ICD-10-CM | POA: Diagnosis not present

## 2015-10-15 DIAGNOSIS — S70361A Insect bite (nonvenomous), right thigh, initial encounter: Secondary | ICD-10-CM | POA: Diagnosis not present

## 2015-10-15 DIAGNOSIS — W57XXXA Bitten or stung by nonvenomous insect and other nonvenomous arthropods, initial encounter: Secondary | ICD-10-CM | POA: Diagnosis not present

## 2015-10-15 DIAGNOSIS — Z87440 Personal history of urinary (tract) infections: Secondary | ICD-10-CM | POA: Diagnosis not present

## 2015-10-15 DIAGNOSIS — Z8742 Personal history of other diseases of the female genital tract: Secondary | ICD-10-CM | POA: Insufficient documentation

## 2015-10-15 DIAGNOSIS — S30860A Insect bite (nonvenomous) of lower back and pelvis, initial encounter: Secondary | ICD-10-CM | POA: Diagnosis not present

## 2015-10-15 DIAGNOSIS — Z872 Personal history of diseases of the skin and subcutaneous tissue: Secondary | ICD-10-CM | POA: Diagnosis not present

## 2015-10-15 DIAGNOSIS — Z720 Tobacco use: Secondary | ICD-10-CM | POA: Insufficient documentation

## 2015-10-15 DIAGNOSIS — Y939 Activity, unspecified: Secondary | ICD-10-CM | POA: Insufficient documentation

## 2015-10-15 DIAGNOSIS — R21 Rash and other nonspecific skin eruption: Secondary | ICD-10-CM | POA: Diagnosis present

## 2015-10-15 MED ORDER — HYDROXYZINE HCL 10 MG PO TABS: 10.0000 mg | ORAL_TABLET | Freq: Four times a day (QID) | ORAL | Status: DC | PRN

## 2015-10-15 MED ORDER — HYDROXYZINE HCL 10 MG PO TABS
10.0000 mg | ORAL_TABLET | Freq: Once | ORAL | Status: AC
  Administered 2015-10-15: 10 mg via ORAL
  Filled 2015-10-15: qty 1

## 2015-10-15 NOTE — ED Notes (Signed)
Pt reports rash and itching to extremities x3 days, pt has been using hydrocortisone cream at home w/o relief.

## 2015-10-15 NOTE — ED Provider Notes (Signed)
CSN: [id number]     Arrival date & time 10/15/15  0155 History  By signing my name below, I, Evelene Croon, attest that this documentation has been prepared under the direction and in the presence of Julianne Rice, MD . Electronically Signed: Evelene Croon, Scribe. 10/15/2015. 4:02 AM.   Chief Complaint  Patient presents with  . Rash    The history is provided by the patient. No language interpreter was used.     HPI Comments:  Beth Gibson is a 29 y.o. female who presents to the Emergency Department complaining of a pruritic rash to her BUE,  RLE,  and low back which she first noticed ~ 3 days ago. She has applied hydrocortisone cream and taken benadryl without relief. Pt notes her symptom is worse at night and has some concern for bed bugs . Pt has no other complaints or symptoms at this time. No other family members with similar symptoms   Past Medical History  Diagnosis Date  . Chlamydia   . Trichomonas   . Preterm labor   . Headache(784.0)   . UTI (lower urinary tract infection)   . BV (bacterial vaginosis)   . Yeast vaginitis   . Abscess of axilla, right    Past Surgical History  Procedure Laterality Date  . Cyst removed from l buttock    . Cesarean section    . Wisdom tooth extraction    . Wisdom tooth extraction  2009  . Cystectomy  2009    left buttock  . Cesarean section  04/18/2012    Procedure: CESAREAN SECTION;  Surgeon: Guss Bunde, MD;  Location: [facility];  Service: Gynecology;  Laterality: N/A;  repeat  . Incision and drainage abscess Right 11/01/2014    Procedure: INCISION AND DRAINAGE RIGHT AXILLARY ABSCESS;  Surgeon: Leighton Ruff, MD;  Location: [facility];  Service: General;  Laterality: Right;  . Irrigation and debridement abscess Right 08/09/2015    Procedure: IRRIGATION AND DEBRIDEMENT AXILLARY ABSCESS;  Surgeon: Alphonsa Overall, MD;  Location: [facility];  Service: General;  Laterality: Right;   Family History  Problem Relation Age of Onset  .  Hypertension Mother   . Diabetes Mellitus I Father    Social History  Substance Use Topics  . Smoking status: Current Some Day Smoker  . Smokeless tobacco: Never Used  . Alcohol Use: 0.5 oz/week    1 Standard drinks or equivalent per week     Comment: "special occasions"   OB History    Gravida Para Term Preterm AB TAB SAB Ectopic Multiple Living   3 2 0 2 1 0 1 0 0 2      Review of Systems  Constitutional: Negative for fever and chills.  HENT: Negative for facial swelling.   Respiratory: Negative for shortness of breath.   Gastrointestinal: Negative for nausea, vomiting and abdominal pain.  Musculoskeletal: Negative for myalgias and arthralgias.  Skin: Positive for rash.  Neurological: Negative for dizziness, weakness, light-headedness, numbness and headaches.  All other systems reviewed and are negative.     Allergies  Vicodin  Home Medications   Prior to Admission medications   Medication Sig Start Date End Date Taking? Authorizing Provider  doxycycline (VIBRA-TABS) 100 MG tablet Take 1 tablet (100 mg total) by mouth 2 (two) times daily. 08/10/15   Jackolyn Confer, MD  hydrOXYzine (ATARAX/VISTARIL) 10 MG tablet Take 1 tablet (10 mg total) by mouth every 6 (six) hours as needed for itching. 10/15/15   Julianne Rice, MD  ibuprofen (ADVIL,MOTRIN) 800 MG tablet Take 1,600 mg by mouth every 8 (eight) hours as needed for moderate pain.    Historical Provider, MD  oxyCODONE (OXY IR/ROXICODONE) 5 MG immediate release tablet Take 1-2 tablets (5-10 mg total) by mouth every 4 (four) hours as needed for moderate pain, severe pain or breakthrough pain. 08/10/15   Jackolyn Confer, MD  trimethoprim-polymyxin b (POLYTRIM) ophthalmic solution Place 1 drop into the left eye every 4 (four) hours. 10/12/15   Janne Napoleon, NP   BP 126/79 mmHg  Pulse 69  Temp(Src) 97.7 F (36.5 C) (Oral)  Resp 18  Ht 5\' 8"  (1.727 m)  Wt 211 lb 6 oz (95.879 kg)  BMI 32.15 kg/m2  SpO2 99%  LMP 09/30/2015  (Approximate) Physical Exam  Constitutional: She is oriented to person, place, and time. She appears well-developed and well-nourished. No distress.  HENT:  Head: Normocephalic and atraumatic.  Mouth/Throat: Oropharynx is clear and moist.  Eyes: EOM are normal. Pupils are equal, round, and reactive to light.  Neck: Normal range of motion. Neck supple.  Cardiovascular: Normal rate and regular rhythm.   Pulmonary/Chest: Effort normal and breath sounds normal. No respiratory distress. She has no wheezes. She has no rales. She exhibits no tenderness.  Abdominal: Soft. Bowel sounds are normal. She exhibits no distension and no mass. There is no tenderness. There is no rebound and no guarding.  Musculoskeletal: Normal range of motion. She exhibits no edema or tenderness.  Neurological: She is alert and oriented to person, place, and time.  Skin: Skin is warm and dry. Rash noted. No erythema.  patient has multiple indurated lesions in a linear fashion down the lateral surface of the right thigh with central punctate lesion. This is consistent with some type of bite or sting. Patient also has multiple excoriated lesions to the bilateral upper extremities and lower back. There is no evidence of any infection. No interdigital lesions.  Psychiatric: She has a normal mood and affect. Her behavior is normal.  Nursing note and vitals reviewed.   ED Course  Procedures   DIAGNOSTIC STUDIES:  Oxygen Saturation is 98% on RA, normal by my interpretation.    COORDINATION OF CARE:  3:58 AM Discussed treatment plan with pt at bedside and pt agreed to plan.  Labs Review Labs Reviewed - No data to display  Imaging Review No results found.    EKG Interpretation None      MDM   Final diagnoses:  Bed bug bite    I personally performed the services described in this documentation, which was scribed in my presence. The recorded information has been reviewed and is accurate.   Patient with  multiple bite marks. Believe likely bedbugs.    Julianne Rice, MD 10/15/15 [phone]

## 2015-10-15 NOTE — Discharge Instructions (Signed)
Bedbugs Bedbugs are tiny bugs that live in and around beds. They stay hidden during the day, and they come out at night and bite. Bedbugs need blood to live and grow. WHERE ARE BEDBUGS FOUND? Bedbugs can be found anywhere, whether a place is clean or dirty. They are most often found in places where many people come and go, such as hotels, shelters, dorms, and health care settings. It is also common for them to be found in homes where there are many birds or bats nearby. WHAT ARE BEDBUG BITES LIKE? A bedbug bite leaves a small red bump with a darker red dot in the middle. The bump may appear soon after a person is bitten or a day or more later. Bedbug bites usually do not hurt, but they may itch. Most people do not need treatment for bedbug bites. The bumps usually go away on their own in a few days. HOW DO I CHECK FOR BEDBUGS? Bedbugs are reddish-brown, oval, and flat. They range in size from 1 mm to 7 mm and they cannot fly. Look for bedbugs in these places:  On mattresses, bed frames, headboards, and box springs.  On drapes and curtains in bedrooms.  Under carpeting in bedrooms.  Behind electrical outlets.  Behind any wallpaper that is peeling.  Inside luggage. Also look for black or red spots or stains on or near the bed. Stains can come from bedbugs that have been crushed or from bedbug waste. WHAT SHOULD I DO IF I FIND BEDBUGS? When Traveling If you find bedbugs while traveling, check all of your possessions carefully before you bring them into your home. Consider throwing away anything that has bedbugs on it. At Home If you find bedbugs at home, your bedroom may need to be treated by a pest control expert. You may also need to throw away mattresses or luggage. To help keep bedbugs from coming back, consider taking these actions:  Put a plastic cover over your mattress.  Wash your clothes and bedding in water that is hotter than 120F (48.9C) and dry them on a hot setting. Bedbugs  are killed by high temperatures.  Vacuum often around the bed and in all of the cracks and crevices where the bugs might hide.  Carefully check all used furniture, bedding, or clothes that you bring into your home.  Eliminate bird nests and bat roosts that are near your home. In Your Bed If you find bedbugs in your bed, consider wearing pajamas that have long sleeves and pant legs. Bedbugs usually bite areas of the skin that are not covered.   This information is not intended to replace advice given to you by your health care provider. Make sure you discuss any questions you have with your health care provider.   Document Released: 01/09/2011 Document Revised: 04/23/2015 Document Reviewed: 12/03/2014 Elsevier Interactive Patient Education 2016 Elsevier Inc.  

## 2016-01-26 ENCOUNTER — Emergency Department (HOSPITAL_COMMUNITY)
Admission: EM | Admit: 2016-01-26 | Discharge: 2016-01-26 | Disposition: A | Discharge status: Home / Self Care | Payer: Medicaid Other | Attending: Emergency Medicine | Admitting: Emergency Medicine

## 2016-01-26 ENCOUNTER — Encounter (HOSPITAL_COMMUNITY): Payer: Self-pay | Admitting: Physical Medicine and Rehabilitation

## 2016-01-26 DIAGNOSIS — N61 Mastitis without abscess: Secondary | ICD-10-CM | POA: Insufficient documentation

## 2016-01-26 DIAGNOSIS — F17200 Nicotine dependence, unspecified, uncomplicated: Secondary | ICD-10-CM | POA: Diagnosis not present

## 2016-01-26 DIAGNOSIS — Z87440 Personal history of urinary (tract) infections: Secondary | ICD-10-CM | POA: Insufficient documentation

## 2016-01-26 DIAGNOSIS — Z8619 Personal history of other infectious and parasitic diseases: Secondary | ICD-10-CM | POA: Insufficient documentation

## 2016-01-26 DIAGNOSIS — Z792 Long term (current) use of antibiotics: Secondary | ICD-10-CM | POA: Diagnosis not present

## 2016-01-26 DIAGNOSIS — N611 Abscess of the breast and nipple: Secondary | ICD-10-CM

## 2016-01-26 MED ORDER — LIDOCAINE HCL (PF) 1 % IJ SOLN
30.0000 mL | Freq: Once | INTRAMUSCULAR | Status: AC
  Administered 2016-01-26: 30 mL
  Filled 2016-01-26: qty 30

## 2016-01-26 MED ORDER — OXYCODONE-ACETAMINOPHEN 5-325 MG PO TABS
2.0000 | ORAL_TABLET | Freq: Once | ORAL | Status: AC
  Administered 2016-01-26: 2 via ORAL
  Filled 2016-01-26: qty 2

## 2016-01-26 MED ORDER — SULFAMETHOXAZOLE-TRIMETHOPRIM 400-80 MG PO TABS: 1.0000 | ORAL_TABLET | Freq: Two times a day (BID) | ORAL | Status: DC

## 2016-01-26 MED ORDER — OXYCODONE-ACETAMINOPHEN 5-325 MG PO TABS: 1.0000 | ORAL_TABLET | ORAL | Status: DC | PRN

## 2016-01-26 NOTE — Discharge Instructions (Signed)
Read the information below.  Use the prescribed medication as directed.  Please discuss all new medications with your pharmacist.  Do not take additional tylenol while taking the prescribed pain medication to avoid overdose.  You may return to the Emergency Department at any time for worsening condition or any new symptoms that concern you.    If you develop increased redness, swelling, uncontrolled pain, or fevers greater than 100.4, call the general surgery clinic listed above and/or return to the ER immediately for a recheck.    There should be an antibiotic prescription waiting for your at your pharmacy.  Please take the pills as prescribed until they are gone.

## 2016-01-26 NOTE — ED Notes (Signed)
Pt reports painful raised area under R breast, no drainage noted. No relief with warm compresses at home.

## 2016-01-26 NOTE — Procedures (Signed)
Preoperative diagnosis: right breast abscess  Postoperative diagnosis: right breast abscess   Procedure: incision and drainage of right breast abscess  Surgeon: Gurney Maxin, M.D.  Asst: none  Anesthesia: local  Indications for procedure: Beth Gibson is a 30 y.o. year old female with symptoms of swelling, pain, fever at right breast.  Description of procedure: The patient was placed supine. 8cc 0.25% marcaine infused into skin directly over area. Next 18ga needle used to aspirate 4cc pus. Next a stab incision was made directly over the area and the abscess cavity was packed with 1/4" iodoform. Dressing put in place.   Findings: abscess  Specimen: culture of abscess  Implant: 1/4 ' iodoform  Blood loss: 10cc  Local anesthesia: 8 ml 0.25% marcaine with epinephrine  Complications: none  Gurney Maxin, M.D. General, Bariatric, & Minimally Invasive Surgery [facility]

## 2016-01-26 NOTE — Consult Note (Signed)
Reason for Consult:breast abscess Referring Physician: Mes[place]  Beth Gibson is an 30 y.o. female.  HPI: 29 yo woman with obesity has had swelling and pain in right medial lower breast for past 5 days. She has a long history of hidradenitis and has undergone previous excision. She states she had a fever overnight. No nausea or vomiting. No previous breast abscesses.  Past Medical History  Diagnosis Date  . Chlamydia   . Trichomonas   . Preterm labor   . Headache(784.0)   . UTI (lower urinary tract infection)   . BV (bacterial vaginosis)   . Yeast vaginitis   . Abscess of axilla, right     Past Surgical History  Procedure Laterality Date  . Cyst removed from l buttock    . Cesarean section    . Wisdom tooth extraction    . Wisdom tooth extraction  2009  . Cystectomy  2009    left buttock  . Cesarean section  04/18/2012    Procedure: CESAREAN SECTION;  Surgeon: Guss Bunde, MD;  Location: [facility];  Service: Gynecology;  Laterality: N/A;  repeat  . Incision and drainage abscess Right 11/01/2014    Procedure: INCISION AND DRAINAGE RIGHT AXILLARY ABSCESS;  Surgeon: Leighton Ruff, MD;  Location: [facility];  Service: General;  Laterality: Right;  . Irrigation and debridement abscess Right 08/09/2015    Procedure: IRRIGATION AND DEBRIDEMENT AXILLARY ABSCESS;  Surgeon: Alphonsa Overall, MD;  Location: [facility];  Service: General;  Laterality: Right;    Family History  Problem Relation Age of Onset  . Hypertension Mother   . Diabetes Mellitus I Father     Social History:  reports that she has been smoking.  She has never used smokeless tobacco. She reports that she drinks about 0.5 oz of alcohol per week. She reports that she does not use illicit drugs.  Allergies:  Allergies  Allergen Reactions  . Vicodin [Hydrocodone-Acetaminophen] Itching    Medications: I have reviewed the patient's current medications.  No results found for this or any previous visit (from the past 48  hour(s)).  No results found.  Review of Systems  Constitutional: Negative for fever and chills.  HENT: Negative for hearing loss.   Eyes: Negative for blurred vision and double vision.  Respiratory: Negative for cough and hemoptysis.   Cardiovascular: Negative for chest pain and palpitations.  Gastrointestinal: Negative for nausea, vomiting and abdominal pain.  Genitourinary: Negative for dysuria and urgency.  Musculoskeletal: Negative for myalgias and neck pain.  Skin: Positive for rash. Negative for itching.  Neurological: Negative for dizziness, tingling and headaches.  Endo/Heme/Allergies: Does not bruise/bleed easily.  Psychiatric/Behavioral: Negative for depression and suicidal ideas.   Blood pressure 135/90, pulse 89, temperature 99 F (37.2 C), temperature source Oral, resp. rate 18, height 5\' 8"  (1.727 m), weight 91.173 kg (201 lb), SpO2 99 %. Physical Exam  Vitals reviewed. Constitutional: She is oriented to person, place, and time. She appears well-developed and well-nourished.  HENT:  Head: Normocephalic and atraumatic.  Eyes: Conjunctivae and EOM are normal. Pupils are equal, round, and reactive to light.  Neck: Normal range of motion. Neck supple.  Cardiovascular: Normal rate and regular rhythm.   Respiratory: Effort normal and breath sounds normal.  GI: Soft. Bowel sounds are normal. She exhibits no distension. There is no tenderness.  Musculoskeletal: Normal range of motion.  Neurological: She is alert and oriented to person, place, and time.  Skin: Skin is warm and dry.  Psychiatric: She has a normal mood and affect. Her behavior is normal.    Assessment/Plan: Right breast abscess -I+D at bed side -2 weeks abx -f/u in 1 week with [facility] urgent office  [name] 01/26/2016, 3:02 PM

## 2016-01-26 NOTE — ED Provider Notes (Signed)
CSN: [id number]     Arrival date & time 01/26/16  1007 History  By signing my name below, I, Julien Nordmann, attest that this documentation has been prepared under the direction and in the presence of Grae Leathers, PA-C. Electronically Signed: Julien Nordmann, ED Scribe. 01/26/2016. 1:23 PM.     Chief Complaint  Patient presents with  . Abscess     The history is provided by the patient. No language interpreter was used.   HPI Comments: Beth Gibson is a 30 y.o. female who has a hx of right axilla abscess presents to the Emergency Department complaining of constant, moderate, gradual worsening erythematous and swollen abscess underneath her right breast with associated body aches and chills onset 3 days ago. Pt has a hx of abscesses that have needed surgery. Pt notes she has she has taken ibuprofen and tylenol to alleviate the pain with no relief. Pt has been applying warm compresses to the area. Denies nausea, vomiting, and nipple discharge.  Past Medical History  Diagnosis Date  . Chlamydia   . Trichomonas   . Preterm labor   . Headache(784.0)   . UTI (lower urinary tract infection)   . BV (bacterial vaginosis)   . Yeast vaginitis   . Abscess of axilla, right    Past Surgical History  Procedure Laterality Date  . Cyst removed from l buttock    . Cesarean section    . Wisdom tooth extraction    . Wisdom tooth extraction  2009  . Cystectomy  2009    left buttock  . Cesarean section  04/18/2012    Procedure: CESAREAN SECTION;  Surgeon: Guss Bunde, MD;  Location: [facility];  Service: Gynecology;  Laterality: N/A;  repeat  . Incision and drainage abscess Right 11/01/2014    Procedure: INCISION AND DRAINAGE RIGHT AXILLARY ABSCESS;  Surgeon: Leighton Ruff, MD;  Location: [facility];  Service: General;  Laterality: Right;  . Irrigation and debridement abscess Right 08/09/2015    Procedure: IRRIGATION AND DEBRIDEMENT AXILLARY ABSCESS;  Surgeon: Alphonsa Overall, MD;  Location: [facility];  Service:  General;  Laterality: Right;   Family History  Problem Relation Age of Onset  . Hypertension Mother   . Diabetes Mellitus I Father    Social History  Substance Use Topics  . Smoking status: Current Some Day Smoker  . Smokeless tobacco: Never Used  . Alcohol Use: 0.5 oz/week    1 Standard drinks or equivalent per week     Comment: "special occasions"   OB History    Gravida Para Term Preterm AB TAB SAB Ectopic Multiple Living   3 2 0 2 1 0 1 0 0 2      Review of Systems  Constitutional: Positive for fever and chills.  Gastrointestinal: Negative for nausea and vomiting.  Musculoskeletal: Positive for myalgias.  Skin: Positive for color change.  Allergic/Immunologic: Negative for immunocompromised state.  Hematological: Does not bruise/bleed easily.  Psychiatric/Behavioral: Negative for self-injury.      Allergies  Vicodin  Home Medications   Prior to Admission medications   Medication Sig Start Date End Date Taking? Authorizing Provider  doxycycline (VIBRA-TABS) 100 MG tablet Take 1 tablet (100 mg total) by mouth 2 (two) times daily. 08/10/15   Jackolyn Confer, MD  hydrOXYzine (ATARAX/VISTARIL) 10 MG tablet Take 1 tablet (10 mg total) by mouth every 6 (six) hours as needed for itching. 10/15/15   Julianne Rice, MD  ibuprofen (ADVIL,MOTRIN) 800 MG tablet Take 1,600 mg by mouth  every 8 (eight) hours as needed for moderate pain.    Historical Provider, MD  oxyCODONE (OXY IR/ROXICODONE) 5 MG immediate release tablet Take 1-2 tablets (5-10 mg total) by mouth every 4 (four) hours as needed for moderate pain, severe pain or breakthrough pain. 08/10/15   Jackolyn Confer, MD  trimethoprim-polymyxin b (POLYTRIM) ophthalmic solution Place 1 drop into the left eye every 4 (four) hours. 10/12/15   Janne Napoleon, NP   Triage vitals: BP 135/90 mmHg  Pulse 89  Temp(Src) 99 F (37.2 C) (Oral)  Resp 18  Ht 5\' 8"  (1.727 m)  Wt 201 lb (91.173 kg)  BMI 30.57 kg/m2  SpO2 99% Physical Exam   Constitutional: She appears well-developed and well-nourished. No distress.  HENT:  Head: Normocephalic and atraumatic.  Neck: Neck supple.  Cardiovascular: Normal rate and regular rhythm.   Pulmonary/Chest: Effort normal and breath sounds normal.  Musculoskeletal: Normal range of motion.  Neurological: She is alert. She exhibits normal muscle tone.  Skin: Skin is warm. She is not diaphoretic. There is erythema.  Right inferior breast with erythema, areas of induration, fluctuance, tenderness and warmth  Psychiatric: She has a normal mood and affect. Her behavior is normal.  Nursing note and vitals reviewed.   ED Course  Procedures  DIAGNOSTIC STUDIES: Oxygen Saturation is 99% on RA, normal by my interpretation.  COORDINATION OF CARE:  1:19 PM Discussed treatment plan which includes referral to breast center, antibiotics, nausea medication, and pain medication with pt at bedside and pt agreed to plan.  Labs Review Labs Reviewed - No data to display  Imaging Review No results found. I have personally reviewed and evaluated these images and lab results as part of my medical decision-making.   EKG Interpretation None       1:38 PM I spoke with Dr Kieth Brightly (general surgery) who will come to see the patient.    MDM   Final diagnoses:  Abscess of right breast  Cellulitis of right breast    Afebrile, nontoxic patient with cellulitis and abscess of right inferior breast.  I&D by Dr Kieth Brightly in the ED.  He prescribed bactrim, sent to pharmacy.  D/C home with percocet, general surgery follow up.  Discussed result, findings, treatment, and follow up  with patient.  Pt given return precautions.  Pt verbalizes understanding and agrees with plan.        I personally performed the services described in this documentation, which was scribed in my presence. The recorded information has been reviewed and is accurate.   Clayton Bibles, PA-C 01/26/16 1726  Merrily Pew, MD 01/29/16  [phone]

## 2016-01-26 NOTE — ED Notes (Signed)
Declined W/C at D/C and was escorted to lobby by RN. 

## 2016-01-30 LAB — CULTURE, ROUTINE-ABSCESS: Special Requests: NORMAL

## 2016-03-15 ENCOUNTER — Encounter (HOSPITAL_COMMUNITY): Payer: Self-pay

## 2016-03-15 ENCOUNTER — Emergency Department (HOSPITAL_COMMUNITY)
Admission: EM | Admit: 2016-03-15 | Discharge: 2016-03-16 | Disposition: A | Discharge status: Other Acute Inpatient Hospital | Payer: Medicaid Other | Attending: Emergency Medicine | Admitting: Emergency Medicine

## 2016-03-15 DIAGNOSIS — Z8742 Personal history of other diseases of the female genital tract: Secondary | ICD-10-CM | POA: Insufficient documentation

## 2016-03-15 DIAGNOSIS — Z8619 Personal history of other infectious and parasitic diseases: Secondary | ICD-10-CM | POA: Insufficient documentation

## 2016-03-15 DIAGNOSIS — F32A Depression, unspecified: Secondary | ICD-10-CM

## 2016-03-15 DIAGNOSIS — Z87440 Personal history of urinary (tract) infections: Secondary | ICD-10-CM | POA: Insufficient documentation

## 2016-03-15 DIAGNOSIS — Z872 Personal history of diseases of the skin and subcutaneous tissue: Secondary | ICD-10-CM | POA: Insufficient documentation

## 2016-03-15 DIAGNOSIS — F1210 Cannabis abuse, uncomplicated: Secondary | ICD-10-CM | POA: Insufficient documentation

## 2016-03-15 DIAGNOSIS — F1910 Other psychoactive substance abuse, uncomplicated: Secondary | ICD-10-CM | POA: Insufficient documentation

## 2016-03-15 DIAGNOSIS — F329 Major depressive disorder, single episode, unspecified: Secondary | ICD-10-CM | POA: Insufficient documentation

## 2016-03-15 DIAGNOSIS — F17210 Nicotine dependence, cigarettes, uncomplicated: Secondary | ICD-10-CM | POA: Insufficient documentation

## 2016-03-15 LAB — COMPREHENSIVE METABOLIC PANEL
ALT: 10 U/L — AB (ref 14–54)
AST: 23 U/L (ref 15–41)
Albumin: 4.2 g/dL (ref 3.5–5.0)
Alkaline Phosphatase: 42 U/L (ref 38–126)
Anion gap: 10 (ref 5–15)
BUN: 5 mg/dL — AB (ref 6–20)
CHLORIDE: 106 mmol/L (ref 101–111)
CO2: 23 mmol/L (ref 22–32)
CREATININE: 0.71 mg/dL (ref 0.44–1.00)
Calcium: 9.7 mg/dL (ref 8.9–10.3)
GFR calc Af Amer: >60 mL/min (ref >60)
Glucose, Bld: 102 mg/dL — ABNORMAL HIGH (ref 65–99)
Potassium: 3.9 mmol/L (ref 3.5–5.1)
SODIUM: 139 mmol/L (ref 135–145)
Total Bilirubin: 0.6 mg/dL (ref 0.3–1.2)
Total Protein: 8.4 g/dL — ABNORMAL HIGH (ref 6.5–8.1)

## 2016-03-15 LAB — RAPID URINE DRUG SCREEN, HOSP PERFORMED
AMPHETAMINES: NOT DETECTED
BENZODIAZEPINES: NOT DETECTED
Barbiturates: NOT DETECTED
Cocaine: NOT DETECTED
OPIATES: NOT DETECTED
Tetrahydrocannabinol: POSITIVE — AB

## 2016-03-15 LAB — ACETAMINOPHEN LEVEL: Acetaminophen (Tylenol), Serum: <10 ug/mL — ABNORMAL LOW (ref 10–30)

## 2016-03-15 LAB — CBC
HCT: 31.4 % — ABNORMAL LOW (ref 36.0–46.0)
HEMOGLOBIN: 9.1 g/dL — AB (ref 12.0–15.0)
MCH: 17.7 pg — ABNORMAL LOW (ref 26.0–34.0)
MCHC: 29 g/dL — ABNORMAL LOW (ref 30.0–36.0)
MCV: 61.1 fL — ABNORMAL LOW (ref 78.0–100.0)
PLATELETS: 214 10*3/uL (ref 150–400)
RBC: 5.14 MIL/uL — AB (ref 3.87–5.11)
RDW: 19.6 % — AB (ref 11.5–15.5)
WBC: 5.8 10*3/uL (ref 4.0–10.5)

## 2016-03-15 LAB — SALICYLATE LEVEL: Salicylate Lvl: <4 mg/dL (ref 2.8–30.0)

## 2016-03-15 LAB — ETHANOL: Alcohol, Ethyl (B): <5 mg/dL (ref <5)

## 2016-03-15 MED ORDER — NICOTINE 21 MG/24HR TD PT24: 21.0000 mg | MEDICATED_PATCH | Freq: Every day | TRANSDERMAL | Status: DC

## 2016-03-15 MED ORDER — ZOLPIDEM TARTRATE 5 MG PO TABS: 5.0000 mg | ORAL_TABLET | Freq: Every evening | ORAL | Status: DC | PRN

## 2016-03-15 MED ORDER — IBUPROFEN 400 MG PO TABS: 600.0000 mg | ORAL_TABLET | Freq: Three times a day (TID) | ORAL | Status: DC | PRN

## 2016-03-15 MED ORDER — ONDANSETRON HCL 4 MG PO TABS: 4.0000 mg | ORAL_TABLET | Freq: Three times a day (TID) | ORAL | Status: DC | PRN

## 2016-03-15 NOTE — ED Notes (Signed)
Pt given Sprite to drink - pt declined food at this time.

## 2016-03-15 NOTE — ED Notes (Signed)
Pt here with mother, pt tearful expressing that if she isn't away from everyone she will hurt somebody, no one in particular and not herself.  Pt has been using Molly, ectasy x 1 year, 2-3 times a week.

## 2016-03-15 NOTE — BH Assessment (Addendum)
Tele Assessment Note   Beth Gibson is an 30 y.o. female. Pt h/o depression and increased alcohol and drug use (Ecstasy, molly) as coping responses. PT endorses SI with plan to OD on illicit drugs. Pt reports use of drug and alcohol every other weekend when her children gone for the weekend.  Pt reports that she will use all weekend and then come back to "reality" throughout the week. Pt reports increased urges to use today while children where home and states "I couldn't snap back into reality". Pt states she began breaking items, crying, rocking back and forth and "I felt like I was losing my mind". Pt reports that she contacted her mother, disclosed her SA and requested that she pick up the children and transport pt to hospital.   Pt reports no h/o of suicide attempts or inpatient admissions. Pt reports no hallucinations or self-injurious behaviors. Pt denies any h/o abuse.  Pt reports "built up stress" and identified being a single mother and family conflict as current stressors. Pt reports family h/o substance and alcohol abuse.   Pt reports thoughts of HI and harm to others with no plan, intent or identified victims. Pt states "I felt like I had the devil in me, I felt like I could kill somebody if I wanted to. I just wanted to be away from everybody". Pt reports no h/o violence or aggression.   Pt reports that she typically receives 4 hours of sleep nightly however, has not been to sleep since 3.26.17.  Pt reports vision difficulties and that she is supposed to wear prescription eyeglasses. Pt reports no difficulties performing ADLs.   Diagnosis: MDD, Polysubstance  Past Medical History:  Past Medical History  Diagnosis Date  . Chlamydia   . Trichomonas   . Preterm labor   . Headache(784.0)   . UTI (lower urinary tract infection)   . BV (bacterial vaginosis)   . Yeast vaginitis   . Abscess of axilla, right     Past Surgical History  Procedure Laterality Date  . Cyst removed from  l buttock    . Cesarean section    . Wisdom tooth extraction    . Wisdom tooth extraction  2009  . Cystectomy  2009    left buttock  . Cesarean section  04/18/2012    Procedure: CESAREAN SECTION;  Surgeon: Guss Bunde, MD;  Location: [facility];  Service: Gynecology;  Laterality: N/A;  repeat  . Incision and drainage abscess Right 11/01/2014    Procedure: INCISION AND DRAINAGE RIGHT AXILLARY ABSCESS;  Surgeon: Leighton Ruff, MD;  Location: [facility];  Service: General;  Laterality: Right;  . Irrigation and debridement abscess Right 08/09/2015    Procedure: IRRIGATION AND DEBRIDEMENT AXILLARY ABSCESS;  Surgeon: Alphonsa Overall, MD;  Location: [facility];  Service: General;  Laterality: Right;    Family History:  Family History  Problem Relation Age of Onset  . Hypertension Mother   . Diabetes Mellitus I Father     Social History:  reports that she has been smoking Cigars.  She has never used smokeless tobacco. She reports that she drinks about 0.5 oz of alcohol per week. She reports that she uses illicit drugs about 3 times per week.  Additional Social History:  Alcohol / Drug Use Pain Medications: None Reported Prescriptions: None Reported Over the Counter: None Reported History of alcohol / drug use?: Yes Longest period of sobriety (when/how long): Not Reported Negative Consequences of Use:  (None Endorsed) Withdrawal Symptoms: Irritability  Substance #1 Name of Substance 1: MDMA (Molly, Ectasy) 1 - Age of First Use: 10/2015 1 - Amount (size/oz): 3/4 Ectasy Pills, 2-3 grams of Molly 1 - Frequency: Ectasy Every Other Weekend, Molly Weekly 1 - Duration: Ongoing 1 - Last Use / Amount: 3.26.17 Substance #2 Name of Substance 2: Cannabis 2 - Age of First Use: 01/2016 2 - Amount (size/oz): Not Reported 2 - Frequency: 1-2x/wk 2 - Duration: Ongoing to assist with sleep 2 - Last Use / Amount: Not Reported  CIWA: CIWA-Ar BP: 123/93 mmHg Pulse Rate: 93 COWS:    PATIENT STRENGTHS: (choose at  least two) Average or above average intelligence Communication skills Work skills  Allergies:  Allergies  Allergen Reactions  . Vicodin [Hydrocodone-Acetaminophen] Itching    Home Medications:  (Not in a hospital admission)  OB/GYN Status:  Patient's last menstrual period was 03/01/2016.  General Assessment Data Location of Assessment: [facility] ED TTS Assessment: In system Is this a Tele or Face-to-Face Assessment?: Tele Assessment Is this an Initial Assessment or a Re-assessment for this encounter?: Initial Assessment Marital status: Single Is patient pregnant?: No Pregnancy Status: No Living Arrangements: Children Can pt return to current living arrangement?: Yes Admission Status: Voluntary (States has not eaten in 3 days) Is patient capable of signing voluntary admission?: Yes Referral Source: Self/Family/Friend Insurance type: Medicaid     Crisis Care Plan Living Arrangements: Children Name of Psychiatrist: None Name of Therapist: None  Education Status Is patient currently in school?: Yes Current Grade: None Highest grade of school patient has completed: 12th grade Name of school: None Contact person: None  Risk to self with the past 6 months Suicidal Ideation: Yes-Currently Present Has patient been a risk to self within the past 6 months prior to admission? : No Suicidal Intent: No Has patient had any suicidal intent within the past 6 months prior to admission? : No Is patient at risk for suicide?: Yes Suicidal Plan?: Yes-Currently Present Has patient had any suicidal plan within the past 6 months prior to admission? : No Specify Current Suicidal Plan: OD on illicit drugs Access to Means: Yes Specify Access to Suicidal Means: Access to illict drugs What has been your use of drugs/alcohol within the last 12 months?: Pt reports alcohol, Cannabis, Extasy and Molly Use Previous Attempts/Gestures: No How many times?: 0 Other Self Harm Risks: Depression,  SA Intentional Self Injurious Behavior: None Family Suicide History: No Recent stressful life event(s): Other (Comment) (Family stressors, single parent) Depression: Yes Depression Symptoms: Feeling angry/irritable, Fatigue, Tearfulness, Insomnia, Loss of interest in usual pleasures, Feeling worthless/self pity (feeling hopeless) Substance abuse history and/or treatment for substance abuse?: Yes (states uses Molly and [employer] weekly - states also drinks ETOH weekly) Suicide prevention information given to non-admitted patients: Not applicable  Risk to Others within the past 6 months Homicidal Ideation: Yes-Currently Present Does patient have any lifetime risk of violence toward others beyond the six months prior to admission? : No Thoughts of Harm to Others: Yes-Currently Present Comment - Thoughts of Harm to Others: "I felt like I had the devil in me, I felt like I could kill somebody if I wanted to. I just wanted to be away from everybody" Current Homicidal Intent: No Current Homicidal Plan: No Identified Victim: None History of harm to others?: No Assessment of Violence: None Noted Does patient have access to weapons?: No Criminal Charges Pending?: No Does patient have a court date: No Is patient on probation?: No  Psychosis Hallucinations: None noted Delusions:  None noted  Mental Status Report Motor Activity: Freedom of movement, Unremarkable  Cognitive Functioning Weight Loss: 5 (Since January) Weight Gain: 0 Sleep: Decreased Total Hours of Sleep: 4 Vegetative Symptoms: None  ADLScreening [facility] Services) Patient's cognitive ability adequate to safely complete daily activities?: Yes Patient able to express need for assistance with ADLs?: Yes Independently performs ADLs?: Yes (appropriate for developmental age)  Prior Inpatient Therapy Prior Inpatient Therapy: No  Prior Outpatient Therapy Prior Outpatient Therapy: Yes Prior Therapy Dates: > 5 yrs ago Prior  Therapy Facilty/Provider(s): Not Repoted Reason for Treatment: Depression Does patient have an ACCT team?: No Does patient have Intensive In-House Services?  : No Does patient have [facility] services? : No Does patient have P4CC services?: No  ADL Screening (condition at time of admission) Patient's cognitive ability adequate to safely complete daily activities?: Yes Is the patient deaf or have difficulty hearing?: No Does the patient have difficulty seeing, even when wearing glasses/contacts?: Yes (Pt reports being in need of eyeglasses) Does the patient have difficulty concentrating, remembering, or making decisions?: Yes Patient able to express need for assistance with ADLs?: Yes Does the patient have difficulty dressing or bathing?: No Independently performs ADLs?: Yes (appropriate for developmental age) Does the patient have difficulty walking or climbing stairs?: No Weakness of Legs: None Weakness of Arms/Hands: None  Home Assistive Devices/Equipment Home Assistive Devices/Equipment: None  Therapy Consults (therapy consults require a physician order) PT Evaluation Needed: No OT Evalulation Needed: No SLP Evaluation Needed: No Abuse/Neglect Assessment (Assessment to be complete while patient is alone) Physical Abuse: Denies Verbal Abuse: Denies Sexual Abuse: Denies Exploitation of patient/patient's resources: Denies Self-Neglect: Denies Values / Beliefs Cultural Requests During Hospitalization: None Spiritual Requests During Hospitalization: None Consults Spiritual Care Consult Needed: No Social Work Consult Needed: No Regulatory affairs officer (For Healthcare) Does patient have an advance directive?: No Would patient like information on creating an advanced directive?: No - patient declined information          Disposition:  Disposition Initial Assessment Completed for this Encounter: Yes Disposition of Patient: Other dispositions (Pending psychiatric  recommendation)  Janiqua Friscia J Martinique 03/15/2016 11:00 PM

## 2016-03-15 NOTE — ED Notes (Signed)
States smokes "black and milds" and does not feel like she will need a Nicotine Patch.

## 2016-03-15 NOTE — ED Notes (Signed)
TTS being performed.  

## 2016-03-15 NOTE — ED Notes (Signed)
TTS completed. 

## 2016-03-15 NOTE — ED Provider Notes (Signed)
CSN: [id number]     Arrival date & time 03/15/16  1737 History   First MD Initiated Contact with Patient 03/15/16 2059     Chief Complaint  Patient presents with  . Homicidal  . Drug Problem     (Consider location/radiation/quality/duration/timing/severity/associated sxs/prior Treatment) HPI Comments: This normally healthy 30 year old female presents tonight stating that she doesn't trust her cell pain with people go.  She doesn't know what she will do.  She states her thoughts are racing.  She does use drugs a regular basis including THC, Molly C as well as drink alcohol during her drug binges.  Patient is a 30 y.o. female presenting with drug problem.  Drug Problem Pertinent negatives include no chills, fever or headaches.    Past Medical History  Diagnosis Date  . Chlamydia   . Trichomonas   . Preterm labor   . Headache(784.0)   . UTI (lower urinary tract infection)   . BV (bacterial vaginosis)   . Yeast vaginitis   . Abscess of axilla, right    Past Surgical History  Procedure Laterality Date  . Cyst removed from l buttock    . Cesarean section    . Wisdom tooth extraction    . Wisdom tooth extraction  2009  . Cystectomy  2009    left buttock  . Cesarean section  04/18/2012    Procedure: CESAREAN SECTION;  Surgeon: Guss Bunde, MD;  Location: [facility];  Service: Gynecology;  Laterality: N/A;  repeat  . Incision and drainage abscess Right 11/01/2014    Procedure: INCISION AND DRAINAGE RIGHT AXILLARY ABSCESS;  Surgeon: Leighton Ruff, MD;  Location: [facility];  Service: General;  Laterality: Right;  . Irrigation and debridement abscess Right 08/09/2015    Procedure: IRRIGATION AND DEBRIDEMENT AXILLARY ABSCESS;  Surgeon: Alphonsa Overall, MD;  Location: [facility];  Service: General;  Laterality: Right;   Family History  Problem Relation Age of Onset  . Hypertension Mother   . Diabetes Mellitus I Father    Social History  Substance Use Topics  . Smoking status: Current Some  Day Smoker    Types: Cigars  . Smokeless tobacco: Never Used  . Alcohol Use: 0.5 oz/week    1 Standard drinks or equivalent per week     Comment: "special occasions"   OB History    Gravida Para Term Preterm AB TAB SAB Ectopic Multiple Living   3 2 0 2 1 0 1 0 0 2      Review of Systems  Constitutional: Negative for fever and chills.  Respiratory: Negative for shortness of breath.   Neurological: Negative for headaches.  Psychiatric/Behavioral: Positive for behavioral problems. Negative for suicidal ideas and sleep disturbance.  All other systems reviewed and are negative.     Allergies  Vicodin  Home Medications   Prior to Admission medications   Medication Sig Start Date End Date Taking? Authorizing Provider  oxyCODONE-acetaminophen (PERCOCET/ROXICET) 5-325 MG tablet Take 1-2 tablets by mouth every 4 (four) hours as needed for moderate pain or severe pain. Patient not taking: Reported on 03/15/2016 01/26/16   Clayton Bibles, PA-C  sulfamethoxazole-trimethoprim (BACTRIM) 400-80 MG tablet Take 1 tablet by mouth 2 (two) times daily. Patient not taking: Reported on 03/15/2016 01/26/16   Arta Bruce Kinsinger, MD   BP 126/98 mmHg  Pulse 85  Temp(Src) 98.2 F (36.8 C) (Oral)  Resp 18  Ht 5\' 8"  (1.727 m)  Wt 89.858 kg  BMI 30.13 kg/m2  SpO2 100%  LMP 03/01/2016  Physical Exam  Constitutional: She is oriented to person, place, and time. She appears well-developed and well-nourished.  HENT:  Head: Normocephalic.  Eyes: Pupils are equal, round, and reactive to light.  Neck: Normal range of motion.  Cardiovascular: Normal rate and regular rhythm.   Pulmonary/Chest: Effort normal and breath sounds normal.  Musculoskeletal: Normal range of motion.  Neurological: She is alert and oriented to person, place, and time.  Skin: Skin is warm.  Psychiatric: Her speech is normal and behavior is normal. Judgment normal. Cognition and memory are normal. She exhibits a depressed mood. She  expresses homicidal ideation. She expresses no homicidal plans.  Nursing note and vitals reviewed.   ED Course  Procedures (including critical care time) Labs Review Labs Reviewed  COMPREHENSIVE METABOLIC PANEL - Abnormal; Notable for the following:    Glucose, Bld 102 (*)    BUN 5 (*)    Total Protein 8.4 (*)    ALT 10 (*)    All other components within normal limits  ACETAMINOPHEN LEVEL - Abnormal; Notable for the following:    Acetaminophen (Tylenol), Serum <10 (*)    All other components within normal limits  CBC - Abnormal; Notable for the following:    RBC 5.14 (*)    Hemoglobin 9.1 (*)    HCT 31.4 (*)    MCV 61.1 (*)    MCH 17.7 (*)    MCHC 29.0 (*)    RDW 19.6 (*)    All other components within normal limits  URINE RAPID DRUG SCREEN, HOSP PERFORMED - Abnormal; Notable for the following:    Tetrahydrocannabinol POSITIVE (*)    All other components within normal limits  ETHANOL  SALICYLATE LEVEL  I-STAT BETA HCG BLOOD, ED ([facility], [facility], [facility] ONLY)    Imaging Review No results found. I have personally reviewed and evaluated these images and lab results as part of my medical decision-making.   EKG Interpretation None    Patient is medically cleared for TTS evaluation Will be sen in AM by Psy.  MDM   Final diagnoses:  Depression  Polysubstance abuse         Junius Creamer, NP 03/16/16 [id number]  Carmin Muskrat, MD 03/17/16 [phone]

## 2016-03-15 NOTE — ED Notes (Signed)
Pt states that her mother took her cell phone home

## 2016-03-16 ENCOUNTER — Inpatient Hospital Stay (HOSPITAL_COMMUNITY)
Admission: AD | Admit: 2016-03-16 | Discharge: 2016-03-18 | DRG: 885 | Disposition: A | Discharge status: Home / Self Care | Payer: Medicaid Other | Source: Intra-hospital | Attending: Psychiatry | Admitting: Psychiatry

## 2016-03-16 ENCOUNTER — Encounter (HOSPITAL_COMMUNITY): Payer: Self-pay

## 2016-03-16 DIAGNOSIS — R45851 Suicidal ideations: Secondary | ICD-10-CM | POA: Diagnosis present

## 2016-03-16 DIAGNOSIS — Z833 Family history of diabetes mellitus: Secondary | ICD-10-CM

## 2016-03-16 DIAGNOSIS — D509 Iron deficiency anemia, unspecified: Secondary | ICD-10-CM | POA: Diagnosis present

## 2016-03-16 DIAGNOSIS — G4700 Insomnia, unspecified: Secondary | ICD-10-CM | POA: Diagnosis present

## 2016-03-16 DIAGNOSIS — F333 Major depressive disorder, recurrent, severe with psychotic symptoms: Principal | ICD-10-CM | POA: Insufficient documentation

## 2016-03-16 DIAGNOSIS — F410 Panic disorder [episodic paroxysmal anxiety] without agoraphobia: Secondary | ICD-10-CM | POA: Diagnosis present

## 2016-03-16 DIAGNOSIS — F17290 Nicotine dependence, other tobacco product, uncomplicated: Secondary | ICD-10-CM | POA: Diagnosis present

## 2016-03-16 DIAGNOSIS — F329 Major depressive disorder, single episode, unspecified: Secondary | ICD-10-CM | POA: Diagnosis present

## 2016-03-16 DIAGNOSIS — Z8249 Family history of ischemic heart disease and other diseases of the circulatory system: Secondary | ICD-10-CM | POA: Diagnosis not present

## 2016-03-16 LAB — PREGNANCY, URINE: Preg Test, Ur: NEGATIVE

## 2016-03-16 MED ORDER — TRAZODONE HCL 50 MG PO TABS
50.0000 mg | ORAL_TABLET | Freq: Every evening | ORAL | Status: DC | PRN
  Administered 2016-03-16 – 2016-03-17 (×2): 50 mg via ORAL
  Filled 2016-03-16 (×2): qty 1
  Filled 2016-03-16: qty 7

## 2016-03-16 MED ORDER — FERROUS SULFATE 325 (65 FE) MG PO TABS
325.0000 mg | ORAL_TABLET | Freq: Every day | ORAL | Status: DC
  Administered 2016-03-16 – 2016-03-18 (×3): 325 mg via ORAL
  Filled 2016-03-16 (×5): qty 1

## 2016-03-16 MED ORDER — FLUOXETINE HCL 20 MG PO CAPS
20.0000 mg | ORAL_CAPSULE | Freq: Every day | ORAL | Status: DC
  Administered 2016-03-16 – 2016-03-18 (×3): 20 mg via ORAL
  Filled 2016-03-16 (×5): qty 1

## 2016-03-16 MED ORDER — ALUM & MAG HYDROXIDE-SIMETH 200-200-20 MG/5ML PO SUSP: 30.0000 mL | ORAL | Status: DC | PRN

## 2016-03-16 MED ORDER — HYDROXYZINE HCL 25 MG PO TABS
25.0000 mg | ORAL_TABLET | Freq: Four times a day (QID) | ORAL | Status: DC | PRN
  Filled 2016-03-16: qty 10

## 2016-03-16 MED ORDER — MAGNESIUM HYDROXIDE 400 MG/5ML PO SUSP: 30.0000 mL | Freq: Every day | ORAL | Status: DC | PRN

## 2016-03-16 NOTE — Progress Notes (Signed)
Patient ID: AMIREE SEE, female   DOB: 12/21/1986, 30 y.o.   MRN: [id number]  Writer received report from [place] and has assumed care of this patient. No distress noted during introduction with this patient. Q15 minute safety checks are maintained.

## 2016-03-16 NOTE — ED Provider Notes (Signed)
Patient was accepted to psychiatric facility by Dr Parke Poisson. Pt is medically stable, no acute distress on exam and able to be transferred safely to facility capable of providing appropriate care.   Beth Grosser, MD 03/16/16 1030

## 2016-03-16 NOTE — ED Notes (Signed)
Pt aaox4 , Pt   Spoke to her  mom on phone. Telling her she was going to [facility]. [facility] here to transport pt.  Her clothes were found on pod E and given to pelham. Pt ambulatory to thru ER to [facility]

## 2016-03-16 NOTE — Progress Notes (Signed)
Adult Psychoeducational Group Note  Date:  03/16/2016 Time:  8:20 PM  Group Topic/Focus:  Wrap-Up Group:   The focus of this group is to help patients review their daily goal of treatment and discuss progress on daily workbooks.  Participation Level:  Active  Participation Quality:  Appropriate  Affect:  Appropriate  Cognitive:  Appropriate  Insight: Appropriate  Engagement in Group:  Engaged  Modes of Intervention:  Discussion  Additional Comments:  Pt was pleasant. Pt rated her overall day a 3 out of 10 because it is her first day here. Pt did not have a goal for the day, but looks forward to setting and achieving one tomorrow.   Lincoln Brigham 03/16/2016, 9:12 PM

## 2016-03-16 NOTE — Progress Notes (Signed)
Pt accepted to [facility] bed 403-2, attending Dr Parke Poisson. Report can be called at [phone]. Pt's bed will be ready at 11am per [facility].  Sharren Bridge, MSW, LCSW Clinical Social Work, Disposition  03/16/2016 [phone]

## 2016-03-16 NOTE — H&P (Addendum)
Psychiatric Admission Assessment Adult  Patient Identification: Beth Gibson MRN:  [id number] Date of Evaluation:  03/16/2016 Chief Complaint: " I had been on drugs over the weekend, and I just started feeling depressed and hearing things " Principal Diagnosis:  Diagnosis:   Patient Active Problem List   Diagnosis Date Noted  . MDD (major depressive disorder) ([place]) [F32.9] 03/16/2016  . Microcytic anemia [D50.9] 11/01/2014  . Hyperkalemia [E87.5] 11/01/2014  . Axillary abscess [L02.419] 10/31/2014  . Status post repeat low transverse cesarean section [Z98.891] 04/21/2012  . Obesity (BMI 30-39.9) [E66.9] 12/10/2011  . Previous cesarean delivery, antepartum condition or complication [H88.502] 77/41/2878   History of Present Illness: 30 year old female, who states she has a history of substance abuse, mainly "Molly" ( MDMA) , Cannabis, Alcohol. States she uses mostly in binges, over weekends. States she was using heavily since Friday, and stopped Sunday morning . States " that day I just broke down", states " I felt so sad, crying, and then I started hearing voices telling me to break stuff, and I felt angry". States " I started feeling like somebody else was in my body"  Reports vague , passive suicidal ideations, without any specific plan or intention.  States she then called her mother , who brought her to the hospital. States that she has been struggled with depression for  " a long time ", and states " I have just had to pretend that I was doing well, that I was strong ".  Associated Signs/Symptoms: Depression Symptoms:  depressed mood, anhedonia, insomnia, recurrent thoughts of death, loss of energy/fatigue, decreased appetite, (Hypo) Manic Symptoms:   Denies  Anxiety Symptoms:   States she feels she worries excessively about stressors such as her children, finances .  Psychotic Symptoms:  Recent hallucinations as above, states " it just started Sunday, I had never before heard any  voices ". No hallucinations today .  PTSD Symptoms: Denies  Total Time spent with patient: 45 minutes  Past Psychiatric History: this is her first psychiatric admission , no history of suicidal attempts, no history of self cutting, no prior history of psychosis, denies any history of mania [place].  Reports worrying excessively and some panic attacks, no clear agoraphobia.   Is the patient at risk to self? Yes.    Has the patient been a risk to self in the past 6 months? No.  Has the patient been a risk to self within the distant past? No.  Is the patient a risk to others? No.  Has the patient been a risk to others in the past 6 months? No.  Has the patient been a risk to others within the distant past? No.   Prior Inpatient Therapy:  denies  Prior Outpatient Therapy:  denies   Alcohol Screening: 1. How often do you have a drink containing alcohol?: 2 to 4 times a month 2. How many drinks containing alcohol do you have on a typical day when you are drinking?: 1 or 2 3. How often do you have six or more drinks on one occasion?: Less than monthly Preliminary Score: 1 9. Have you or someone else been injured as a result of your drinking?: No 10. Has a relative or friend or a doctor or another health worker been concerned about your drinking or suggested you cut down?: No Alcohol Use Disorder Identification Test Final Score (AUDIT): 3 Brief Intervention: AUDIT score less than 7 or less-screening does not suggest unhealthy drinking-brief intervention not indicated  Substance Abuse History in the last 12 months:  Reports substance abuse x 1 year, mostly in binges / on weekends.  Denies any prior history of alcohol dependence, abuses alcohol over weekends. Identifies alcohol, MDMA, Cannabis. Denies IVDA. Denies any opiate abuse .  Consequences of Substance Abuse: Denies - denies any history of black outs or of seizures  Previous Psychotropic Medications: Remembers being on antidepressant 10  years ago, but does not remember name of it or how long she took it  Psychological Evaluations:  No  Past Medical History:  History of iron deficiency anemia  Past Medical History  Diagnosis Date  . Chlamydia   . Trichomonas   . Preterm labor   . Headache(784.0)   . UTI (lower urinary tract infection)   . BV (bacterial vaginosis)   . Yeast vaginitis   . Abscess of axilla, right     Past Surgical History  Procedure Laterality Date  . Cyst removed from l buttock    . Cesarean section    . Wisdom tooth extraction    . Wisdom tooth extraction  2009  . Cystectomy  2009    left buttock  . Cesarean section  04/18/2012    Procedure: CESAREAN SECTION;  Surgeon: Guss Bunde, MD;  Location: [facility];  Service: Gynecology;  Laterality: N/A;  repeat  . Incision and drainage abscess Right 11/01/2014    Procedure: INCISION AND DRAINAGE RIGHT AXILLARY ABSCESS;  Surgeon: Leighton Ruff, MD;  Location: [facility];  Service: General;  Laterality: Right;  . Irrigation and debridement abscess Right 08/09/2015    Procedure: IRRIGATION AND DEBRIDEMENT AXILLARY ABSCESS;  Surgeon: Alphonsa Overall, MD;  Location: [facility];  Service: General;  Laterality: Right;   Family History:  Parents alive, separated , minimal contact with father, very close to mother , . Has two brothers, one sister .  Family History  Problem Relation Age of Onset  . Hypertension Mother   . Diabetes Mellitus I Father    Family Psychiatric  History: brother has ADHD, brother - cannabis abuse, sister- MDMA abuse, denies suicides in family  Tobacco Screening:  Smokes half a pack per day  Social History:  Single, lives with her two children ( 4,5) , currently in the custody of her mother. Works at a [employer], no legal issues . No SO .  History  Alcohol Use  . 0.5 oz/week  . 1 Standard drinks or equivalent per week    Comment: "special occasions"     History  Drug Use  . 3.00 per week    Comment: Molly, Ectasy      Additional Social History:      Pain Medications: None Reported Prescriptions: None Reported Over the Counter: None Reported History of alcohol / drug use?: Yes Longest period of sobriety (when/how long): Not Reported Negative Consequences of Use: Personal relationships Withdrawal Symptoms: Irritability Name of Substance 1: MDMA (Molly, Ectasy) 1 - Age of First Use: 10/2015 1 - Amount (size/oz): 3/4 Ectasy Pills, 2-3 grams of Molly 1 - Frequency: Ectasy Every Other Weekend, Molly Weekly 1 - Duration: Ongoing 1 - Last Use / Amount: 3.26.17 Name of Substance 2: Cannabis 2 - Age of First Use: unknown 2 - Amount (size/oz): Not Reported 2 - Frequency: 1-2x/wk 2 - Duration: Ongoing to assist with sleep 2 - Last Use / Amount: Last weekend Name of Substance 3: Alcohol ([place]) 3 - Age of First Use: Pt reports drinking every other weekend for approx. 1  yr 3 - Amount (size/oz): 1-2 pints 3 - Frequency: Every other weekend 3 - Duration: Ongoing 3 - Last Use / Amount: 3.26.17/ 1 pint              Allergies:   Allergies  Allergen Reactions  . Vicodin [Hydrocodone-Acetaminophen] Itching   Lab Results:  Results for orders placed or performed during the hospital encounter of 03/15/16 (from the past 48 hour(s))  Comprehensive metabolic panel     Status: Abnormal   Collection Time: 03/15/16  6:07 PM  Result Value Ref Range   Sodium 139 135 - 145 mmol/L   Potassium 3.9 3.5 - 5.1 mmol/L   Chloride 106 101 - 111 mmol/L   CO2 23 22 - 32 mmol/L   Glucose, Bld 102 (H) 65 - 99 mg/dL   BUN 5 (L) 6 - 20 mg/dL   Creatinine, Ser 0.71 0.44 - 1.00 mg/dL   Calcium 9.7 8.9 - 10.3 mg/dL   Total Protein 8.4 (H) 6.5 - 8.1 g/dL   Albumin 4.2 3.5 - 5.0 g/dL   AST 23 15 - 41 U/L   ALT 10 (L) 14 - 54 U/L   Alkaline Phosphatase 42 38 - 126 U/L   Total Bilirubin 0.6 0.3 - 1.2 mg/dL   GFR calc non Af Amer >60 >60 mL/min   GFR calc Af Amer >60 >60 mL/min    Comment: (NOTE) The eGFR has been  calculated using the CKD EPI equation. This calculation has not been validated in all clinical situations. eGFR's persistently <60 mL/min signify possible Chronic Kidney Disease.    Anion gap 10 5 - 15  Ethanol (ETOH)     Status: None   Collection Time: 03/15/16  6:07 PM  Result Value Ref Range   Alcohol, Ethyl (B) <5 <5 mg/dL    Comment:        LOWEST DETECTABLE LIMIT FOR SERUM ALCOHOL IS 5 mg/dL FOR MEDICAL PURPOSES ONLY   Salicylate level     Status: None   Collection Time: 03/15/16  6:07 PM  Result Value Ref Range   Salicylate Lvl <7.4 2.8 - 30.0 mg/dL  Acetaminophen level     Status: Abnormal   Collection Time: 03/15/16  6:07 PM  Result Value Ref Range   Acetaminophen (Tylenol), Serum <10 (L) 10 - 30 ug/mL    Comment:        THERAPEUTIC CONCENTRATIONS VARY SIGNIFICANTLY. A RANGE OF 10-30 ug/mL MAY BE AN EFFECTIVE CONCENTRATION FOR MANY PATIENTS. HOWEVER, SOME ARE BEST TREATED AT CONCENTRATIONS OUTSIDE THIS RANGE. ACETAMINOPHEN CONCENTRATIONS >150 ug/mL AT 4 HOURS AFTER INGESTION AND >50 ug/mL AT 12 HOURS AFTER INGESTION ARE OFTEN ASSOCIATED WITH TOXIC REACTIONS.   CBC     Status: Abnormal   Collection Time: 03/15/16  6:07 PM  Result Value Ref Range   WBC 5.8 4.0 - 10.5 K/uL   RBC 5.14 (H) 3.87 - 5.11 MIL/uL   Hemoglobin 9.1 (L) 12.0 - 15.0 g/dL   HCT 31.4 (L) 36.0 - 46.0 %   MCV 61.1 (L) 78.0 - 100.0 fL   MCH 17.7 (L) 26.0 - 34.0 pg   MCHC 29.0 (L) 30.0 - 36.0 g/dL   RDW 19.6 (H) 11.5 - 15.5 %   Platelets 214 150 - 400 K/uL    Comment: REPEATED TO VERIFY PLATELET COUNT CONFIRMED BY SMEAR   Urine rapid drug screen (hosp performed) (Not at [facility])     Status: Abnormal   Collection Time: 03/15/16  6:09 PM  Result  Value Ref Range   Opiates NONE DETECTED NONE DETECTED   Cocaine NONE DETECTED NONE DETECTED   Benzodiazepines NONE DETECTED NONE DETECTED   Amphetamines NONE DETECTED NONE DETECTED   Tetrahydrocannabinol POSITIVE (A) NONE DETECTED   Barbiturates  NONE DETECTED NONE DETECTED    Comment:        DRUG SCREEN FOR MEDICAL PURPOSES ONLY.  IF CONFIRMATION IS NEEDED FOR ANY PURPOSE, NOTIFY LAB WITHIN 5 DAYS.        LOWEST DETECTABLE LIMITS FOR URINE DRUG SCREEN Drug Class       Cutoff (ng/mL) Amphetamine      1000 Barbiturate      200 Benzodiazepine   096 Tricyclics       045 Opiates          300 Cocaine          300 THC              50     Blood Alcohol level:  Lab Results  Component Value Date   ETH <5 40/98/1191    Metabolic Disorder Labs:  No results found for: HGBA1C, MPG No results found for: PROLACTIN No results found for: CHOL, TRIG, HDL, CHOLHDL, VLDL, LDLCALC  Current Medications: Current Facility-Administered Medications  Medication Dose Route Frequency Provider Last Rate Last Dose  . alum & mag hydroxide-simeth (MAALOX/MYLANTA) 200-200-20 MG/5ML suspension 30 mL  30 mL Oral Q4H PRN Niel Hummer, NP      . hydrOXYzine (ATARAX/VISTARIL) tablet 25 mg  25 mg Oral Q6H PRN Niel Hummer, NP      . magnesium hydroxide (MILK OF MAGNESIA) suspension 30 mL  30 mL Oral Daily PRN Niel Hummer, NP      . traZODone (DESYREL) tablet 50 mg  50 mg Oral QHS PRN Niel Hummer, NP       PTA Medications: No prescriptions prior to admission    Musculoskeletal: Strength & Muscle Tone: within normal limits Gait & Station: normal Patient leans: N/A  Psychiatric Specialty Exam: Physical Exam  Review of Systems  Constitutional: Negative.   HENT: Negative.   Eyes: Negative.   Respiratory: Negative.   Cardiovascular: Negative.   Gastrointestinal: Negative.   Genitourinary: Negative.   Musculoskeletal: Negative.   Skin: Negative.   Neurological: Negative for seizures.  Endo/Heme/Allergies: Negative.   Psychiatric/Behavioral: Positive for depression, hallucinations and substance abuse.  All other systems reviewed and are negative.   Blood pressure 120/75, pulse 101, temperature 98.6 F (37 C), temperature source Oral,  resp. rate 18, height '5\' 8"'  (1.727 m), weight 195 lb (88.451 kg), last menstrual period 03/01/2016, SpO2 100 %.Body mass index is 29.66 kg/(m^2).  General Appearance: Fairly Groomed  Engineer, water::  Fair  Speech:  Normal Rate  Volume:  Normal  Mood:  depressed   Affect:  constricted but reactive   Thought Process:  Linear  Orientation:  Other:  fully alert and attentive   Thought Content:  no hallucinations at this time , no delusions expressed, not internally preoccupied   Suicidal Thoughts:  No denies any suicidal ideations, denies any self injurious ideations at this time   Homicidal Thoughts:  No  Memory:  recent and remote grossly intact   Judgement:  Other:  improving   Insight:  improving   Psychomotor Activity:  Normal  Concentration:  Good  Recall:  Good  Fund of Knowledge:Good  Language: Good  Akathisia:  Negative  Handed:  Right  AIMS (if indicated):  Assets:  Desire for Improvement Resilience  ADL's:  Intact  Cognition: WNL  Sleep:        Treatment Plan Summary: Daily contact with patient to assess and evaluate symptoms and progress in treatment, Medication management, Plan inpatient admission and medications as below  Observation Level/Precautions:  15 minute checks  Laboratory:  as needed   Psychotherapy:  Milieu, groups   Medications: agrees to antidepressant trial- we discussed options, start PROZAC 20 mgrs QAM.  TRAZODONE PRN for insomnia   History of Iron deficiency anemia, was taking iron supplementation in the past but had stopped , will restart   Consultations:  As needed   Discharge Concerns:  -   Estimated LOS: 5 days   Other:     I certify that inpatient services furnished can reasonably be expected to improve the patient's condition.    Neita Garnet, MD 3/27/20173:00 PM

## 2016-03-16 NOTE — Tx Team (Signed)
Initial Interdisciplinary Treatment Plan   PATIENT STRESSORS: Marital or family conflict Substance abuse   PATIENT STRENGTHS: Motivation for treatment/growth Physical Health Work skills   PROBLEM LIST: Problem List/Patient Goals Date to be addressed Date deferred Reason deferred Estimated date of resolution  Depression 03/16/16     Anxiety  03/16/16     Substance abuse 03/16/16     "To be myself again" 03/16/16     "To be happy about being here" 03/16/16                              DISCHARGE CRITERIA:  Motivation to continue treatment in a less acute level of care Verbal commitment to aftercare and medication compliance  PRELIMINARY DISCHARGE PLAN: Outpatient therapy Medication management  PATIENT/FAMIILY INVOLVEMENT: This treatment plan has been presented to and reviewed with the patient, Beth Gibson.  The patient and family have been given the opportunity to ask questions and make suggestions.  [name] 03/16/2016, 12:33 PM

## 2016-03-16 NOTE — BHH Suicide Risk Assessment (Signed)
[facility] Admission Suicide Risk Assessment   Nursing information obtained from:   patient and chart  Demographic factors:   30 year old single female, employed, two children  Current Mental Status:   see below  Loss Factors:   single mother, limited social support  Historical Factors:   history of substance abuse, history of depression  Risk Reduction Factors:   responsible for children, family   Total Time spent with patient: 45 minutes Principal Problem:  Depression  Diagnosis:   Patient Active Problem List   Diagnosis Date Noted  . MDD (major depressive disorder) ([place]) [F32.9] 03/16/2016  . Microcytic anemia [D50.9] 11/01/2014  . Hyperkalemia [E87.5] 11/01/2014  . Axillary abscess [L02.419] 10/31/2014  . Status post repeat low transverse cesarean section [Z98.891] 04/21/2012  . Obesity (BMI 30-39.9) [E66.9] 12/10/2011  . Previous cesarean delivery, antepartum condition or complication [id number]     Continued Clinical Symptoms:  Alcohol Use Disorder Identification Test Final Score (AUDIT): 3 The "Alcohol Use Disorders Identification Test", Guidelines for Use in Primary Care, Second Edition.  World Pharmacologist [facility]). Score between 0-7:  no or low risk or alcohol related problems. Score between 8-15:  moderate risk of alcohol related problems. Score between 16-19:  high risk of alcohol related problems. Score 20 or above:  warrants further diagnostic evaluation for alcohol dependence and treatment.   CLINICAL FACTORS:  30 year old female, reports long history of previously untreated depression. History of abusing drugs, mostly on weekend binges. Substances of choice are cannabis, MDMA, and sometimes alcohol. Last used this weekend. After she stopped, she felt severely depressed, developed vague SI, and also developed hallucinations, which she had never had before .      Psychiatric Specialty Exam: ROS  Blood pressure 120/75, pulse 101, temperature 98.6 F (37  C), temperature source Oral, resp. rate 18, height 5\' 8"  (1.727 m), weight 195 lb (88.451 kg), last menstrual period 03/01/2016, SpO2 100 %.Body mass index is 29.66 kg/(m^2).   see admit note MSE                                                       COGNITIVE FEATURES THAT CONTRIBUTE TO RISK:  Closed-mindedness and Loss of executive function    SUICIDE RISK:   Moderate:  Frequent suicidal ideation with limited intensity, and duration, some specificity in terms of plans, no associated intent, good self-control, limited dysphoria/symptomatology, some risk factors present, and identifiable protective factors, including available and accessible social support.  PLAN OF CARE: Patient will be admitted to inpatient psychiatric unit for stabilization and safety. Will provide and encourage milieu participation. Provide medication management and maked adjustments as needed.  Will follow daily.    I certify that inpatient services furnished can reasonably be expected to improve the patient's condition.   Neita Garnet, MD 03/16/2016, 4:02 PM

## 2016-03-16 NOTE — Progress Notes (Signed)
Beth Gibson is a 30 year old female being admitted voluntarily from [facility] for depression and increased alcohol and drug use.  She states that she has a plan to OD on illicit drugs.  She reports using on the weekends that she does not have her children but last weekend she had a hard time snapping back into reality.  She started breaking things, crying, rocking back and forth and she felt like she was losing her mind.  She admits that she started hearing voices yesterday to destroy things in her house.  She denies any auditory hallucinations today.  She reports thoughts of wanting to harm other with no plan or intent.  Admission paperwork completed and signed.  Belongings searched and secured in locker # 27 (hat and white shoes strings).  Skin assessment completed and noted scar on right underarm from cyst removal, scar on right breast for cyst removal, tattoos: chest, lower back, both legs, right foot and both arms.  Q 15 minute checks initiated for safety.  We will monitor the progress towards her goals.

## 2016-03-17 DIAGNOSIS — F333 Major depressive disorder, recurrent, severe with psychotic symptoms: Secondary | ICD-10-CM | POA: Insufficient documentation

## 2016-03-17 LAB — TSH: TSH: 1.015 u[IU]/mL (ref 0.350–4.500)

## 2016-03-17 NOTE — Progress Notes (Signed)
Pt states this is her first day on the unit.  She states she feels safe being here.  She denies SI/HI/AVH with Probation officer.  Pt has been observed most of the evening in the dayroom talking with peers.  She had a visit from her mother tonight which encouraged her.  Pt denies any withdrawal symptoms at this time.  Writer informed pt that she did not have any scheduled medications for the evening, but also discussed the prn medications that were available to her.  Pt requested something to help her sleep which she was given at bedtime.  Pt has been pleasant and cooperative with staff.  Pt was encouraged to make her needs known to staff.  Pt voices no needs or concerns.  Support and encouragement offered.  Discharge plans are in process.  Safety maintained with q15 minute checks.

## 2016-03-17 NOTE — Progress Notes (Signed)
Recreation Therapy Notes  Animal-Assisted Activity (AAA) Program Checklist/Progress Notes Patient Eligibility Criteria Checklist & Daily Group note for Rec Tx Intervention  Date: 03.28.2017 Time: 2:45pm Location: [address]   AAA/T Program Assumption of Risk Form signed by Teacher, music or Parent Legal Guardian Yes.    Patient is free of allergies or sever asthma Yes.    Patient reports no fear of animals Yes.    Patient reports no history of cruelty to animals Yes.    Patient understands his/her participation is voluntary Yes.    Behavioral Response: Did not attend.    Laureen Ochs Arnel Wymer, LRT/CTRS        Lane Hacker 03/17/2016 3:23 PM

## 2016-03-17 NOTE — Progress Notes (Signed)
Pt attended spiritual care group on grief and loss facilitated by chaplain Travonne Schowalter   Group opened with brief discussion and psycho-social ed around grief and loss in relationships and in relation to self - identifying life patterns, circumstances, changes that cause losses. Established group norm of speaking from own life experience. Group goal of establishing open and affirming space for members to share loss and experience with grief, normalize grief experience and provide psycho social education and grief support.     

## 2016-03-17 NOTE — BHH Group Notes (Signed)
[place] LCSW Group Therapy  03/17/2016   1:15 PM   Type of Therapy:  Group Therapy  Participation Level:  Minimal  Participation Quality:  Attentive, Sharing and Supportive  Affect:  Depressed and Flat  Cognitive:  Alert and Oriented  Insight:  Developing/Improving and Engaged  Engagement in Therapy:  Developing/Improving and Engaged  Modes of Intervention:  Clarification, Confrontation, Discussion, Education, Exploration, Limit-setting, Orientation, Problem-solving, Rapport Building, Art therapist, Socialization and Support  Summary of Progress/Problems: The topic for group therapy was feelings about diagnosis.  Pt actively participated in group discussion on their past and current diagnosis and how they feel towards this.  Pt also identified how society and family members judge them, based on their diagnosis as well as stereotypes and stigmas.  Patient discussed the shift from the "old" [place] and discussed turning to substances to self-medicate. She kept things hidden from those around her for fear of judgement. She states that her goal is to "find the old me again". She participated minimally in discussion but shared letters that she wrote to her both her illness and her future to the group. CSW and other group members provided patient with emotional support and encouragement.  Tilden Fossa, MSW, [place] Clinical Social Worker [facility] [phone]

## 2016-03-17 NOTE — Progress Notes (Signed)
[facility] MD Progress Note  03/17/2016 4:26 PM Beth Gibson  MRN:  [id number] Subjective:  Patient states she is feeling better than prior to admission. At this time denies medication  side effects. Objective : I have discussed case with treatment team and have met with patient . Patient presents with partially improved mood and range of affect. She reports less depression and is focusing on returning home soon to see her children, currently being taken care of by her mother, and to return to work. Denies any ongoing psychotic symptoms and does not appear internally preoccupied, no delusions expressed . No disruptive behaviors on unit, visible in milieu, pleasant on approach. We discussed issues regarding substance abuse and reviewed negative impact substance abuse has likely had on her mood and dangers associated with abusing illicit drugs- expresses motivation in maintaining abstinence. Denies cravings at this time . Principal Problem: MDD (major depressive disorder) ([place]) Diagnosis:   Patient Active Problem List   Diagnosis Date Noted  . MDD (major depressive disorder) ([place]) [F32.9] 03/16/2016  . Microcytic anemia [D50.9] 11/01/2014  . Hyperkalemia [E87.5] 11/01/2014  . Axillary abscess [L02.419] 10/31/2014  . Status post repeat low transverse cesarean section [Z98.891] 04/21/2012  . Obesity (BMI 30-39.9) [E66.9] 12/10/2011  . Previous cesarean delivery, antepartum condition or complication [B20.100] 71/21/9758   Total Time spent with patient: 25 minutes     Past Medical History:  Past Medical History  Diagnosis Date  . Chlamydia   . Trichomonas   . Preterm labor   . Headache(784.0)   . UTI (lower urinary tract infection)   . BV (bacterial vaginosis)   . Yeast vaginitis   . Abscess of axilla, right     Past Surgical History  Procedure Laterality Date  . Cyst removed from l buttock    . Cesarean section    . Wisdom tooth extraction    . Wisdom tooth extraction  2009  .  Cystectomy  2009    left buttock  . Cesarean section  04/18/2012    Procedure: CESAREAN SECTION;  Surgeon: Guss Bunde, MD;  Location: [facility];  Service: Gynecology;  Laterality: N/A;  repeat  . Incision and drainage abscess Right 11/01/2014    Procedure: INCISION AND DRAINAGE RIGHT AXILLARY ABSCESS;  Surgeon: Leighton Ruff, MD;  Location: [facility];  Service: General;  Laterality: Right;  . Irrigation and debridement abscess Right 08/09/2015    Procedure: IRRIGATION AND DEBRIDEMENT AXILLARY ABSCESS;  Surgeon: Alphonsa Overall, MD;  Location: [facility];  Service: General;  Laterality: Right;   Family History:  Family History  Problem Relation Age of Onset  . Hypertension Mother   . Diabetes Mellitus I Father     Social History:  History  Alcohol Use  . 0.5 oz/week  . 1 Standard drinks or equivalent per week    Comment: "special occasions"     History  Drug Use  . 3.00 per week    Comment: [place]     Social History   Social History  . Marital Status: Single    Spouse Name: N/A  . Number of Children: N/A  . Years of Education: N/A   Social History Main Topics  . Smoking status: Current Some Day Smoker    Types: Cigars  . Smokeless tobacco: Never Used  . Alcohol Use: 0.5 oz/week    1 Standard drinks or equivalent per week     Comment: "special occasions"  . Drug Use: 3.00 per week  Comment: Molly, Ectasy   . Sexual Activity: Yes    Birth Control/ Protection: None     Comment: Taking pill "somtimes", last taken 2 weeks ago   Other Topics Concern  . None   Social History Narrative   Additional Social History:    Pain Medications: None Reported Prescriptions: None Reported Over the Counter: None Reported History of alcohol / drug use?: Yes Longest period of sobriety (when/how long): Not Reported Negative Consequences of Use: Personal relationships Withdrawal Symptoms: Irritability Name of Substance 1: MDMA (Molly, Ectasy) 1 - Age of First Use: 10/2015 1 -  Amount (size/oz): 3/4 Ectasy Pills, 2-3 grams of Molly 1 - Frequency: Ectasy Every Other Weekend, Molly Weekly 1 - Duration: Ongoing 1 - Last Use / Amount: 3.26.17 Name of Substance 2: Cannabis 2 - Age of First Use: unknown 2 - Amount (size/oz): Not Reported 2 - Frequency: 1-2x/wk 2 - Duration: Ongoing to assist with sleep 2 - Last Use / Amount: Last weekend Name of Substance 3: Alcohol ([place]) 3 - Age of First Use: Pt reports drinking every other weekend for approx. 1 yr 3 - Amount (size/oz): 1-2 pints 3 - Frequency: Every other weekend 3 - Duration: Ongoing 3 - Last Use / Amount: 3.26.17/ 1 pint  Sleep: Good  Appetite:  Good  Current Medications: Current Facility-Administered Medications  Medication Dose Route Frequency Provider Last Rate Last Dose  . alum & mag hydroxide-simeth (MAALOX/MYLANTA) 200-200-20 MG/5ML suspension 30 mL  30 mL Oral Q4H PRN Niel Hummer, NP      . ferrous sulfate tablet 325 mg  325 mg Oral Q breakfast Jenne Campus, MD   325 mg at 03/17/16 0813  . FLUoxetine (PROZAC) capsule 20 mg  20 mg Oral Daily Jenne Campus, MD   20 mg at 03/17/16 0813  . hydrOXYzine (ATARAX/VISTARIL) tablet 25 mg  25 mg Oral Q6H PRN Niel Hummer, NP      . magnesium hydroxide (MILK OF MAGNESIA) suspension 30 mL  30 mL Oral Daily PRN Niel Hummer, NP      . traZODone (DESYREL) tablet 50 mg  50 mg Oral QHS PRN Niel Hummer, NP   50 mg at 03/16/16 2104    Lab Results:  Results for orders placed or performed during the hospital encounter of 03/16/16 (from the past 48 hour(s))  Pregnancy, urine     Status: None   Collection Time: 03/16/16  4:57 PM  Result Value Ref Range   Preg Test, Ur NEGATIVE NEGATIVE    Comment:        THE SENSITIVITY OF THIS METHODOLOGY IS >20 mIU/mL. Performed at [facility]   TSH     Status: None   Collection Time: 03/17/16  7:00 AM  Result Value Ref Range   TSH 1.015 0.350 - 4.500 uIU/mL    Comment: Performed at [facility]    Blood Alcohol level:  Lab Results  Component Value Date   [facility] <5 03/15/2016    Physical Findings: AIMS: Facial and Oral Movements Muscles of Facial Expression: None, normal Lips and Perioral Area: None, normal Tongue: None, normal,Extremity Movements Upper (arms, wrists, hands, fingers): None, normal Lower (legs, knees, ankles, toes): None, normal, Trunk Movements Neck, shoulders, hips: None, normal, Overall Severity Severity of abnormal movements (highest score from questions above): None, normal Incapacitation due to abnormal movements: None, normal Patient's awareness of abnormal movements (rate only patient's report): No Awareness, Dental Status Current problems  with teeth and/or dentures?: No Does patient usually wear dentures?: No  CIWA:  CIWA-Ar Total: 1 COWS:     Musculoskeletal: Strength & Muscle Tone: within normal limits Gait & Station: normal Patient leans: N/A  Psychiatric Specialty Exam: ROS no headache, no chest pain, no dyspnea,  no vomiting , no rash   Blood pressure 111/69, pulse 106, temperature 98.1 F (36.7 C), temperature source Oral, resp. rate 16, height '5\' 8"'  (1.727 m), weight 195 lb (88.451 kg), last menstrual period 03/01/2016, SpO2 100 %.Body mass index is 29.66 kg/(m^2).  General Appearance: Well Groomed  Engineer, water::  Good  Speech:  Normal Rate  Volume:  Normal  Mood:  improved, less depressed   Affect:  Appropriate and more reactive, smiles at times appropriately   Thought Process:  Linear  Orientation:  Full (Time, Place, and Person)  Thought Content:  denies hallucinations, no delusions expressed, not internally preoccupied   Suicidal Thoughts:  No- at this time denies any suicidal ideations, and denies any self injurious ideations   Homicidal Thoughts:  No  Memory:  recent and remote grossly intact   Judgement:  Other:  improving   Insight:  improving   Psychomotor Activity:  Normal  Concentration:  Good   Recall:  Good  Fund of Knowledge:Good  Language: Good  Akathisia:  Negative  Handed:  Right  AIMS (if indicated):     Assets:  Communication Skills Desire for Improvement Physical Health Resilience  ADL's:  Intact  Cognition: WNL  Sleep:  Number of Hours: 6.5  Assessment - at this time patient presents partially improved compared to admission, with improved mood, improved range of affect, and no current psychotic symptoms, behavior calm, no agitation . Tolerating Prozac trial well at this time. Gaining insight into substance abuse and negative impact it has had , states she is motivated in maintaining sobriety. Treatment Plan Summary: Daily contact with patient to assess and evaluate symptoms and progress in treatment, Medication management, Plan inpatient treatment  and medications as below  Encourage ongoing group participation to work on coping skills and symptom reduction Continue Prozac 20 mgrs QDAY for depression and anxiety Continue Trazodone 50 mgrs QHS PRN for insomnia as needed  Continue Hydroxyzine 25 mgrs Q 6 hours PRN for agitation or anxiety as needed  Continue to encourage efforts to work on relapse prevention and sobriety Treatment team working on disposition planning  Neita Garnet, MD 03/17/2016, 4:26 PM

## 2016-03-17 NOTE — BHH Suicide Risk Assessment (Signed)
[place] INPATIENT:  Family/Significant Other Suicide Prevention Education  Suicide Prevention Education:  Patient Refusal for Family/Significant Other Suicide Prevention Education: The patient Beth Gibson has refused to provide written consent for family/significant other to be provided Family/Significant Other Suicide Prevention Education during admission and/or prior to discharge.  Physician notified. SPE reviewed with patient and brochure provided. Patient encouraged to return to hospital if having suicidal thoughts, patient verbalized his/her understanding and has no further questions at this time.   Bo Mcclintock 03/17/2016, 11:24 AM

## 2016-03-17 NOTE — Progress Notes (Signed)
Adult Psychoeducational Group Note  Date:  03/17/2016 Time:  8:25 PM  Group Topic/Focus:  Wrap-Up Group:   The focus of this group is to help patients review their daily goal of treatment and discuss progress on daily workbooks.  Participation Level:  Active  Participation Quality:  Appropriate  Affect:  Appropriate  Cognitive:  Appropriate  Insight: Appropriate  Engagement in Group:  Engaged  Modes of Intervention:  Discussion  Additional Comments:  Pt was pleasant. Pt rated her overall day an 8 out of 10 and stated that she had a good day because she woke up feeling a little better today. Pt reported that she achieved her goal for the day, but did not state exactly what her goal was.   Lincoln Brigham 03/17/2016, 9:16 PM
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
## 2016-03-17 NOTE — Tx Team (Signed)
Interdisciplinary Treatment Plan Update (Adult) Date: 03/17/2016   Date: 03/17/2016 11:17 AM  Progress in Treatment:  Attending groups: Yes  Participating in groups: Yes  Taking medication as prescribed: Yes  Tolerating medication: Yes  Family/Significant othe contact made: No, Pt declines Patient understands diagnosis: Yes AEB seeking help with depression and substance use Discussing patient identified problems/goals with staff: Yes  Medical problems stabilized or resolved: Yes  Denies suicidal/homicidal ideation: Yes Patient has not harmed self or Others: Yes   New problem(s) identified: None identified at this time.   Discharge Plan or Barriers: Pt will return home and follow-up with outpatient providers  Additional comments:  Patient and CSW reviewed pt's identified goals and treatment plan. Patient verbalized understanding and agreed to treatment plan. CSW reviewed [facility] "Discharge Process and Patient Involvement" Form. Pt verbalized understanding of information provided and signed form.   Reason for Continuation of Hospitalization:  Depression Medication stabilization Suicidal ideation  Estimated length of stay: 2-3 days  Review of initial/current patient goals per problem list:   1.  Goal(s): Patient will participate in aftercare plan  Met:  Yes  Target date: 3-5 days from date of admission   As evidenced by: Patient will participate within aftercare plan AEB aftercare provider and housing plan at discharge being identified.   03/17/16: Pt will return home and follow-up with outpatient resources  2.  Goal (s): Patient will exhibit decreased depressive symptoms and suicidal ideations.  Met:  Progressing  Target date: 3-5 days from date of admission   As evidenced by: Patient will utilize self rating of depression at 3 or below and demonstrate decreased signs of depression or be deemed stable for discharge by MD.  03/17/16: Pt depressive symptoms improving; denies  SI  Attendees:  Patient:    Family:    Physician: Dr. Parke Poisson, MD  03/17/2016 11:17 AM  Nursing: Lars Pinks, RN Case manager  03/17/2016 11:17 AM  Clinical Social Worker Peri Maris, [place] 03/17/2016 11:17 AM  Other: Tilden Fossa, LCSWA 03/17/2016 11:17 AM  Clinical:  Mayra Neer, RN; Marcella Dubs, RN 03/17/2016 11:17 AM  Other: , RN Charge Nurse 03/17/2016 11:17 AM  Other: Hilda Lias, [place], [place] Work [phone]

## 2016-03-17 NOTE — Progress Notes (Addendum)
Patient ID: Beth Gibson, female   DOB: July 17, 1986, 30 y.o.   MRN: [id number]  Pt currently presents with a blunted affect and coperative behavior. Per self inventory, pt rates depression at a 7/8, hopelessness 7 and anxiety 3. Pt's daily goal is to "just being happy and finding myself again" and they intend to do so by "participate in activities with others." Pt reports fair sleep, a fair appetite, low energy and good concentration. Pt denies cravings. Pt reports increased drainage in a recently treated abscess in her R axilla.  Pt provided with medications per providers orders. Pt's labs and vitals were monitored throughout the day. Pt supported emotionally and encouraged to express concerns and questions. Pt educated on medications. Pt signed a 72 hour 03/17/2016 at 1429. Reported drainage to provider, pt instructed to notify staff if increased pain, drainage or swelling occurs.   Pt's safety ensured with 15 minute and environmental checks. Pt currently denies SI/HI and A/V hallucinations. Pt verbally agrees to seek staff if SI/HI or A/VH occurs and to consult with staff before acting on these thoughts. Will continue POC.

## 2016-03-17 NOTE — Progress Notes (Signed)
Patient ID: SONDREA BREN, female   DOB: April 14, 1986, 30 y.o.   MRN: [id number]  Adult Psychoeducational Group Note  Date:  03/17/2016 Time:  09:00am  Group Topic/Focus:  Recovery Goals:   The focus of this group is to identify appropriate goals for recovery and establish a plan to achieve them.  Participation Level:  Active  Participation Quality:  Appropriate  Affect:  Appropriate  Cognitive:  Appropriate  Insight: Appropriate  Engagement in Group:  Engaged  Modes of Intervention:  Activity, Discussion, Education and Support  Additional Comments:  Pt able to identify two goals for recovery.   Elenore Rota 03/17/2016, 11:02 AM

## 2016-03-18 MED ORDER — FLUOXETINE HCL 20 MG PO CAPS: 20.0000 mg | ORAL_CAPSULE | Freq: Every day | ORAL | Status: DC

## 2016-03-18 MED ORDER — HYDROXYZINE HCL 25 MG PO TABS: 25.0000 mg | ORAL_TABLET | Freq: Four times a day (QID) | ORAL | Status: DC | PRN

## 2016-03-18 MED ORDER — TRAZODONE HCL 50 MG PO TABS: 50.0000 mg | ORAL_TABLET | Freq: Every evening | ORAL | Status: DC | PRN

## 2016-03-18 NOTE — BHH Suicide Risk Assessment (Addendum)
[facility] Discharge Suicide Risk Assessment   Principal Problem: MDD (major depressive disorder) [facility]) Discharge Diagnoses:  Patient Active Problem List   Diagnosis Date Noted  . Severe episode of recurrent major depressive disorder, with psychotic features ([place]) [F33.3]   . MDD (major depressive disorder) ([place]) [F32.9] 03/16/2016  . Microcytic anemia [D50.9] 11/01/2014  . Hyperkalemia [E87.5] 11/01/2014  . Axillary abscess [L02.419] 10/31/2014  . Status post repeat low transverse cesarean section [Z98.891] 04/21/2012  . Obesity (BMI 30-39.9) [E66.9] 12/10/2011  . Previous cesarean delivery, antepartum condition or complication [id number]    Total Time spent with patient: 30 minutes  Musculoskeletal: Strength & Muscle Tone: within normal limits Gait & Station: normal Patient leans: N/A  Psychiatric Specialty Exam: ROS  Blood pressure 108/80, pulse 96, temperature 98.8 F (37.1 C), temperature source Oral, resp. rate 16, height 5\' 8"  (1.727 m), weight 195 lb (88.451 kg), last menstrual period 03/01/2016, SpO2 100 %.Body mass index is 29.66 kg/(m^2).  General Appearance: Well Groomed  Eye Contact::  Good  Speech:  Normal [id number]  Volume:  Normal  Mood:  states " I am happy because I am going home ". Minimizes depression at this time   Affect:  Appropriate, more reactive   Thought Process:  Linear  Orientation:  Full (Time, Place, and Person)  Thought Content:  denies hallucinations, no delusions   Suicidal Thoughts:  No denies any suicidal ideations, denies any self injurious ideations or  Any homicidal ideations   Homicidal Thoughts:  No  Memory:  recent and remote grossly intact   Judgement:  Other:  improved   Insight:  Good  Psychomotor Activity:  Normal  Concentration:  Good  Recall:  Good  Fund of Knowledge:Good  Language: Good  Akathisia:  Negative  Handed:  Right  AIMS (if indicated):     Assets:  Desire for Improvement Resilience  Sleep:  Number of  Hours: 6.25  Cognition: WNL  ADL's:  Intact   Mental Status Per Nursing Assessment::   On Admission:     Demographic Factors:  30 year old single female, two children, employed   Loss Factors:  Single mother, financial difficulties, limited support   Historical Factors: History of depression, history of substance abuse   Risk Reduction Factors:   Responsible for children under 70 years of age, Sense of responsibility to family, Employed and Positive coping skills or problem solving skills  Continued Clinical Symptoms:  At this time patient is alert, attentive,well related, mood improved, affect improved, more reactive, no thought disorder, no SI or HI, no psychotic symptoms, future oriented, looking forward to seeing her children and planning on returning to work as of next Monday. Denies any medication side effects. We have discussed importance of sobriety, abstinence from illicit drugs as main aspect of treatment goals- she expresses motivation and increased insight regarding substance abuse issues   Cognitive Features That Contribute To Risk:  No gross cognitive deficits noted upon discharge. Is alert , attentive, and oriented x 3   Suicide Risk:  Mild:  Suicidal ideation of limited frequency, intensity, duration, and specificity.  There are no identifiable plans, no associated intent, mild dysphoria and related symptoms, good self-control (both objective and subjective assessment), few other risk factors, and identifiable protective factors, including available and accessible social support.    Plan Of Care/Follow-up recommendations:  Activity:  as tolerated  Diet:  regular Tests:  NA Other:  see below   Patient is requesting discharge and there are no current  grounds for involuntary commitment  Patient is leaving in good spirits  Plans to return home Outpatient follow up  For medical issues follows up at [place], MD 03/18/2016, 10:55 AM

## 2016-03-18 NOTE — Progress Notes (Signed)
Pt reports she had a good day.  She denies SI/HI/AVH.  She also denies having any withdrawal symptoms or cravings. She states she was able to go to all the groups today and found them helpful.  She feels the medications are helping her mood.  Forde Dandy has been minimal, but pt makes her needs known to staff.  She has been pleasant and cooperative.  Support and encouragement offered.  Discharge plans are in process.  Safety maintained with q15 minute checks.

## 2016-03-18 NOTE — [facility] Group Notes (Addendum)
[facility] LCSW Aftercare Discharge Planning Group Note  03/18/2016 8:45 AM  Participation Quality: Alert, Appropriate and Oriented  Mood/Affect: Flat  Depression Rating: 7-8  Anxiety Rating: 0  Thoughts of Suicide: Pt denies SI/HI  Will you contract for safety? Yes  Current AVH: Pt denies  Plan for Discharge/Comments: Pt attended discharge planning group and actively participated in group. CSW discussed suicide prevention education with the group and encouraged them to discuss discharge planning and any relevant barriers. Pt reports improvement in mood despite high ratings for depression. She is agreeable to outpatient referrals.  Transportation Means: Pt reports access to transportation  Supports: No supports mentioned at this time  Peri Maris, [place] 03/18/2016 9:24 AM

## 2016-03-18 NOTE — Progress Notes (Signed)
Patient ID: Beth Gibson, female   DOB: [birth year], 30 y.o.   MRN: [id number]  Pt. Denies SI/HI and A/V hallucinations. Belongings returned to patient at time of discharge. Patient denies any new onset of pain or discomfort. Discharge instructions and medications were reviewed with patient. Patient verbalized understanding of both medications and discharge instructions. Patient discharged to lobby where her ride was waiting for her. Q15 minute safety checks maintained until discharge. No distress upon discharge.

## 2016-03-18 NOTE — Progress Notes (Addendum)
  [facility] Adult Case Management Discharge Plan :  Will you be returning to the same living situation after discharge:  Yes,  Pt returning home At discharge, do you have transportation home?: Yes,  Pt family to pick up Do you have the ability to pay for your medications: Yes,  Pt provided with prescriptions  Release of information consent forms completed and in the chart;  Patient's signature needed at discharge.  Patient to Follow up at: Follow-up Information    Follow up with CSW will call you with your appointments.      Next level of care provider has access to [place] and Suicide Prevention discussed: Yes,  with Pt; declines family contact  Have you used any form of tobacco in the last 30 days? (Cigarettes, Smokeless Tobacco, Cigars, and/or Pipes): Yes  Has patient been referred to the Quitline?: Patient refused referral  Patient has been referred for addiction treatment: Pt. refused referral  Bo Mcclintock 03/18/2016, 12:37 PM  Follow-up Information    Follow up with [facility] On 03/23/2016.   Why:  at 3:00pm for your initial assessment.   Contact information:   [address] [place] [phone]    Addendum to progress note - see above for time/date of appointment for aftercare.   Edwyna Shell, LCSW Lead Clinical Social Worker Phone:  [phone]

## 2016-03-18 NOTE — Tx Team (Signed)
Interdisciplinary Treatment Plan Update (Adult) Date: 03/18/2016   Date: 03/18/2016 12:34 PM  Progress in Treatment:  Attending groups: Yes  Participating in groups: Yes  Taking medication as prescribed: Yes  Tolerating medication: Yes  Family/Significant othe contact made: No, Beth Gibson declines Patient understands diagnosis: Yes AEB seeking help with depression and substance use Discussing patient identified problems/goals with staff: Yes  Medical problems stabilized or resolved: Yes  Denies suicidal/homicidal ideation: Yes Patient has not harmed self or Others: Yes   New problem(s) identified: None identified at this time.   Discharge Plan or Barriers: Beth Gibson will return home and follow-up with outpatient providers  Additional comments:  Patient and CSW reviewed Beth Gibson's identified goals and treatment plan. Patient verbalized understanding and agreed to treatment plan. CSW reviewed [facility] "Discharge Process and Patient Involvement" Form. Beth Gibson verbalized understanding of information provided and signed form.   Reason for Continuation of Hospitalization:  Depression Medication stabilization Suicidal ideation  Estimated length of stay: 0 days  Review of initial/current patient goals per problem list:   1.  Goal(s): Patient will participate in aftercare plan  Met:  Yes  Target date: 3-5 days from date of admission   As evidenced by: Patient will participate within aftercare plan AEB aftercare provider and housing plan at discharge being identified.   03/17/16: Beth Gibson will return home and follow-up with outpatient resources  2.  Goal (s): Patient will exhibit decreased depressive symptoms and suicidal ideations.  Met:  Adequate for DC  Target date: 3-5 days from date of admission   As evidenced by: Patient will utilize self rating of depression at 3 or below and demonstrate decreased signs of depression or be deemed stable for discharge by MD.  03/17/16: Beth Gibson depressive symptoms improving; denies  SI  03/18/16: MD feels that Beth Gibson's symptoms have decreased to the point that they can be managed in an outpatient setting.   Attendees:  Patient:    Family:    Physician: Dr. Parke Poisson, MD  03/18/2016 12:34 PM  Nursing: Lars Pinks, RN Case manager  03/18/2016 12:34 PM  Clinical Social Worker Peri Maris, [place] 03/18/2016 12:34 PM  Other: Tilden Fossa, LCSWA 03/18/2016 12:34 PM  Clinical:  Gaylan Gerold, RN; Hester Mates, RN 03/18/2016 12:34 PM  Other: , RN Charge Nurse 03/18/2016 12:34 PM  Other: Hilda Lias, [place], [place] Social Work [phone]

## 2016-03-18 NOTE — Progress Notes (Signed)
Recreation Therapy Notes  Date: 03.29.2017 Time: 9:30am Location: [address] Group Room   Group Topic: Stress Management  Goal Area(s) Addresses:  Patient will actively participate in stress management techniques presented during session.   Behavioral Response: Did not attend.  Laureen Ochs Rasaan [place], LRT/CTRS        Maryah Marinaro L 03/18/2016 9:57 AM

## 2016-03-18 NOTE — Discharge Summary (Signed)
Physician Discharge Summary Note  Patient:  Beth Gibson is an 30 y.o., female MRN:  [id number] DOB:  1986/11/23 Patient phone:  [phone] (home)  Patient address:   [address] [place] [address],  Total Time spent with patient: Greater than 30 minutes  Date of Admission:  03/16/2016 Date of Discharge: 03-18-16  Reason for Admission: Worsening symptoms of depression with psychosis.  Principal Problem: MDD (major depressive disorder) [facility])  Discharge Diagnoses: Patient Active Problem List   Diagnosis Date Noted  . Severe episode of recurrent major depressive disorder, with psychotic features (Beth Gibson) [F33.3]   . MDD (major depressive disorder) (Beth Gibson) [F32.9] 03/16/2016  . Microcytic anemia [D50.9] 11/01/2014  . Hyperkalemia [E87.5] 11/01/2014  . Axillary abscess [L02.419] 10/31/2014  . Status post repeat low transverse cesarean section [Z98.891] 04/21/2012  . Obesity (BMI 30-39.9) [E66.9] 12/10/2011  . Previous cesarean delivery, antepartum condition or complication [id number]   Past Psychiatric History: Major depressive disorder  Past Medical History:  Past Medical History  Diagnosis Date  . Chlamydia   . Trichomonas   . Preterm labor   . Headache(784.0)   . UTI (lower urinary tract infection)   . BV (bacterial vaginosis)   . Yeast vaginitis   . Abscess of axilla, right     Past Surgical History  Procedure Laterality Date  . Cyst removed from l buttock    . Cesarean section    . Wisdom tooth extraction    . Wisdom tooth extraction  2009  . Cystectomy  2009    left buttock  . Cesarean section  04/18/2012    Procedure: CESAREAN SECTION;  Surgeon: Guss Bunde, MD;  Location: [facility];  Service: Gynecology;  Laterality: N/A;  repeat  . Incision and drainage abscess Right 11/01/2014    Procedure: INCISION AND DRAINAGE RIGHT AXILLARY ABSCESS;  Surgeon: Leighton Ruff, MD;  Location: [facility];  Service: General;  Laterality: Right;  . Irrigation and  debridement abscess Right 08/09/2015    Procedure: IRRIGATION AND DEBRIDEMENT AXILLARY ABSCESS;  Surgeon: Alphonsa Overall, MD;  Location: [facility];  Service: General;  Laterality: Right;   Family History:  Family History  Problem Relation Age of Onset  . Hypertension Mother   . Diabetes Mellitus I Father    Family Psychiatric  History: See H&P  Social History:  History  Alcohol Use  . 0.5 oz/week  . 1 Standard drinks or equivalent per week    Comment: "special occasions"     History  Drug Use  . 3.00 per week    Comment: [address]     Social History   Social History  . Marital Status: Single    Spouse Name: N/A  . Number of Children: N/A  . Years of Education: N/A   Social History Main Topics  . Smoking status: Current Some Day Smoker    Types: Cigars  . Smokeless tobacco: Never Used  . Alcohol Use: 0.5 oz/week    1 Standard drinks or equivalent per week     Comment: "special occasions"  . Drug Use: 3.00 per week     Comment: Molly, Ectasy   . Sexual Activity: Yes    Birth Control/ Protection: None     Comment: Taking pill "somtimes", last taken 2 weeks ago   Other Topics Concern  . None   Social History Narrative   Hospital Course: 30 year old female, who states she has a history of substance abuse issues, mainly "Public relations account executive" (MDMA), Cannabis &  Alcohol. States she uses mostly in binges, over weekends. States she was using heavily since Friday, and stopped Sunday morning . States " that day I just broke down", states " I felt so sad, crying, and then I started hearing voices telling me to break stuff, and I felt angry". States " I started feeling like somebody else was in my body" Reports vague, passive suicidal ideations, without any specific plan or intention. States she then called her mother , who brought her to the hospital. States that she has been struggling with depression for " a long time ", and states " I have just had to pretend that I was doing well, that I  was strong ".   Beth Gibson was admitted to the hospital with her UDS test reports showing positive THC. She did report having been abusing Molly & alcohol in binges as well. However, her reason for admission was worsening symptoms of depression with psychotic features requiring mood stabilization treatment. After evaluation of her symptoms, Beth Gibson was started on medication regimen for her presenting symptoms. Her medication regimen included; Prozac 20 mg for depression, Hydroxyzine 25 mg prn for anxiety & Trazodone 50 mg for sleep. She was also enrolled & participated in the group counseling sessions being offered and held on this unit, she learned coping skills that should help her cope better & maintain mood stability after discharge. She presented no other significant pre-existing health issues that required treatment and or monitoring. Beth Gibson tolerated her treatment regimen without any significant adverse effects and or reactions reported.  During the course of her mental health treatment, Beth Gibson's symptoms were evaluated on daily basis by a clinical provider to ascertain her symptoms are responding to her treatment regimen. This is evidenced by her reports of decreasing symptoms, improved mood & presentation of good affect. She is currently being discharged to continue psychiatric treatment & medication management as noted below. She is provided with all the pertinent information required to make this appointment without problems.   On this day of her hospital discharge, Beth Gibson is in much improved condition than upon admission. She contracted for her safety and felt more in control of her mood. Her symptoms were reported as significantly decreased or resolved completely. She denied SIHI and voiced no AVH. She is instructed & motivated to continue taking medications with a goal of continued improvement in mental health.  She was picked up by her family. She left [facility] in no apparent distress with all  belongings.  Associated Signs/Symptoms:  Physical Findings: AIMS: Facial and Oral Movements Muscles of Facial Expression: None, normal Lips and Perioral Area: None, normal Tongue: None, normal,Extremity Movements Upper (arms, wrists, hands, fingers): None, normal Lower (legs, knees, ankles, toes): None, normal, Trunk Movements Neck, shoulders, hips: None, normal, Overall Severity Severity of abnormal movements (highest score from questions above): None, normal Incapacitation due to abnormal movements: None, normal Patient's awareness of abnormal movements (rate only patient's report): No Awareness, Dental Status Current problems with teeth and/or dentures?: No Does patient usually wear dentures?: No  CIWA:  CIWA-Ar Total: 1 COWS:     Musculoskeletal: Strength & Muscle Tone: within normal limits Gait & Station: normal Patient leans: N/A  Psychiatric Specialty Exam: Review of Systems  Constitutional: Negative.   HENT: Negative.   Eyes: Negative.   Respiratory: Negative.   Cardiovascular: Negative.   Gastrointestinal: Negative.   Genitourinary: Negative.   Musculoskeletal: Negative.   Skin: Negative.   Neurological: Negative.   Endo/Heme/Allergies: Negative.   Psychiatric/Behavioral: Positive  for depression (Stable) and hallucinations (Hx. Psychosis). Negative for suicidal ideas, memory loss and substance abuse. The patient has insomnia (Stable). The patient is not nervous/anxious.     Blood pressure 108/80, pulse 96, temperature 98.8 F (37.1 C), temperature source Oral, resp. rate 16, height 5\' 8"  (1.727 m), weight 88.451 kg (195 lb), last menstrual period 03/01/2016, SpO2 100 %.Body mass index is 29.66 kg/(m^2).  See md's SRA   Have you used any form of tobacco in the last 30 days? (Cigarettes, Smokeless Tobacco, Cigars, and/or Pipes): Yes  Has this patient used any form of tobacco in the last 30 days? (Cigarettes, Smokeless Tobacco, Cigars, and/or Pipes):  No  Blood  Alcohol level:  Lab Results  Component Value Date   ETH <5 [id number]   Metabolic Disorder Labs:  No results found for: HGBA1C, MPG No results found for: PROLACTIN No results found for: CHOL, TRIG, HDL, CHOLHDL, VLDL, LDLCALC  See Psychiatric Specialty Exam and Suicide Risk Assessment completed by Attending Physician prior to discharge.  Discharge destination:  Home  Is patient on multiple antipsychotic therapies at discharge:  No   Has Patient had three or more failed trials of antipsychotic monotherapy by history:  No  Recommended Plan for Multiple Antipsychotic Therapies: NA    Medication List    TAKE these medications      Indication   FLUoxetine 20 MG capsule  Commonly known as:  PROZAC  Take 1 capsule (20 mg total) by mouth daily. For depression   Indication:  Major Depressive Disorder     hydrOXYzine 25 MG tablet  Commonly known as:  ATARAX/VISTARIL  Take 1 tablet (25 mg total) by mouth every 6 (six) hours as needed for anxiety.   Indication:  Anxiety     traZODone 50 MG tablet  Commonly known as:  DESYREL  Take 1 tablet (50 mg total) by mouth at bedtime as needed for sleep.   Indication:  Trouble Sleeping       Follow-up recommendations: Activity:  As tolerated Diet: As recommended by your primary care doctor. Keep all scheduled follow-up appointments as recommended.   Comments: Take all your medications as prescribed by your mental healthcare provider. Report any adverse effects and or reactions from your medicines to your outpatient provider promptly. Patient is instructed and cautioned to not engage in alcohol and or illegal drug use while on prescription medicines. In the event of worsening symptoms, patient is instructed to call the crisis hotline, 911 and or go to the nearest ED for appropriate evaluation and treatment of symptoms. Follow-up with your primary care provider for your other medical issues, concerns and or health care needs.    Signed: Encarnacion Slates, NP, PMHNP-BC 03/18/2016, 11:19 AM   Patient seen, Suicide Assessment Completed.  Disposition Plan Reviewed

## 2016-05-21 ENCOUNTER — Encounter: Payer: Self-pay | Admitting: Emergency Medicine

## 2016-05-21 DIAGNOSIS — Z79899 Other long term (current) drug therapy: Secondary | ICD-10-CM | POA: Diagnosis not present

## 2016-05-21 DIAGNOSIS — K8020 Calculus of gallbladder without cholecystitis without obstruction: Secondary | ICD-10-CM | POA: Diagnosis not present

## 2016-05-21 DIAGNOSIS — F17210 Nicotine dependence, cigarettes, uncomplicated: Secondary | ICD-10-CM | POA: Diagnosis not present

## 2016-05-21 DIAGNOSIS — F333 Major depressive disorder, recurrent, severe with psychotic symptoms: Secondary | ICD-10-CM | POA: Insufficient documentation

## 2016-05-21 DIAGNOSIS — R1031 Right lower quadrant pain: Secondary | ICD-10-CM | POA: Diagnosis present

## 2016-05-21 LAB — COMPREHENSIVE METABOLIC PANEL
ALK PHOS: 42 U/L (ref 38–126)
ALT: 8 U/L — ABNORMAL LOW (ref 14–54)
ANION GAP: 6 (ref 5–15)
AST: 17 U/L (ref 15–41)
Albumin: 4.3 g/dL (ref 3.5–5.0)
BUN: 7 mg/dL (ref 6–20)
CALCIUM: 9.3 mg/dL (ref 8.9–10.3)
CHLORIDE: 105 mmol/L (ref 101–111)
CO2: 24 mmol/L (ref 22–32)
Creatinine, Ser: 0.58 mg/dL (ref 0.44–1.00)
GFR calc non Af Amer: >60 mL/min (ref >60)
Glucose, Bld: 97 mg/dL (ref 65–99)
POTASSIUM: 3.4 mmol/L — AB (ref 3.5–5.1)
SODIUM: 135 mmol/L (ref 135–145)
Total Bilirubin: 0.2 mg/dL — ABNORMAL LOW (ref 0.3–1.2)
Total Protein: 8.4 g/dL — ABNORMAL HIGH (ref 6.5–8.1)

## 2016-05-21 LAB — URINALYSIS COMPLETE WITH MICROSCOPIC (ARMC ONLY)
BACTERIA UA: NONE SEEN
Bilirubin Urine: NEGATIVE
Glucose, UA: NEGATIVE mg/dL
KETONES UR: NEGATIVE mg/dL
Leukocytes, UA: NEGATIVE
Nitrite: NEGATIVE
PH: 6 (ref 5.0–8.0)
PROTEIN: NEGATIVE mg/dL
SPECIFIC GRAVITY, URINE: 1.016 (ref 1.005–1.030)

## 2016-05-21 LAB — POCT PREGNANCY, URINE: PREG TEST UR: NEGATIVE

## 2016-05-21 LAB — LIPASE, BLOOD: LIPASE: 37 U/L (ref 11–51)

## 2016-05-21 NOTE — ED Notes (Addendum)
Pt presents to ED with c/o mid to lower abdominal and perineal pain with nausea and vomiting, onset about 5 days. Pt reports heavy period and dysuria starting last night. Pt reports last period was in 04/04/2016.

## 2016-05-22 ENCOUNTER — Emergency Department: Payer: Medicaid Other

## 2016-05-22 ENCOUNTER — Emergency Department
Admission: EM | Admit: 2016-05-22 | Discharge: 2016-05-22 | Disposition: A | Discharge status: Home / Self Care | Payer: Medicaid Other | Attending: Emergency Medicine | Admitting: Emergency Medicine

## 2016-05-22 DIAGNOSIS — K8020 Calculus of gallbladder without cholecystitis without obstruction: Secondary | ICD-10-CM

## 2016-05-22 LAB — WET PREP, GENITAL
Clue Cells Wet Prep HPF POC: NONE SEEN
SPERM: NONE SEEN
TRICH WET PREP: NONE SEEN
WBC WET PREP: NONE SEEN
YEAST WET PREP: NONE SEEN

## 2016-05-22 LAB — CBC
HEMATOCRIT: 27.9 % — AB (ref 35.0–47.0)
HEMOGLOBIN: 8.6 g/dL — AB (ref 12.0–16.0)
MCH: 18 pg — AB (ref 26.0–34.0)
MCHC: 30.8 g/dL — ABNORMAL LOW (ref 32.0–36.0)
MCV: 58.5 fL — ABNORMAL LOW (ref 80.0–100.0)
Platelets: 185 10*3/uL (ref 150–440)
RBC: 4.77 MIL/uL (ref 3.80–5.20)
RDW: 20.2 % — ABNORMAL HIGH (ref 11.5–14.5)
WBC: 5.1 10*3/uL (ref 3.6–11.0)

## 2016-05-22 LAB — CHLAMYDIA/NGC RT PCR (ARMC ONLY)
Chlamydia Tr: NOT DETECTED
N gonorrhoeae: NOT DETECTED

## 2016-05-22 MED ORDER — MORPHINE SULFATE (PF) 2 MG/ML IV SOLN
INTRAVENOUS | Status: AC
  Administered 2016-05-22: 2 mg via INTRAVENOUS
  Filled 2016-05-22: qty 1

## 2016-05-22 MED ORDER — MORPHINE SULFATE (PF) 2 MG/ML IV SOLN
2.0000 mg | Freq: Once | INTRAVENOUS | Status: AC
  Administered 2016-05-22: 2 mg via INTRAVENOUS

## 2016-05-22 MED ORDER — MORPHINE SULFATE (PF) 4 MG/ML IV SOLN
INTRAVENOUS | Status: AC
  Administered 2016-05-22: 4 mg via INTRAVENOUS
  Filled 2016-05-22: qty 1

## 2016-05-22 MED ORDER — ONDANSETRON HCL 4 MG/2ML IJ SOLN
INTRAMUSCULAR | Status: AC
  Administered 2016-05-22: 4 mg via INTRAVENOUS
  Filled 2016-05-22: qty 2

## 2016-05-22 MED ORDER — IOPAMIDOL (ISOVUE-300) INJECTION 61%
100.0000 mL | Freq: Once | INTRAVENOUS | Status: AC | PRN
  Administered 2016-05-22: 100 mL via INTRAVENOUS

## 2016-05-22 MED ORDER — ONDANSETRON 4 MG PO TBDP: 4.0000 mg | ORAL_TABLET | Freq: Three times a day (TID) | ORAL | Status: DC | PRN

## 2016-05-22 MED ORDER — MORPHINE SULFATE (PF) 4 MG/ML IV SOLN
4.0000 mg | Freq: Once | INTRAVENOUS | Status: AC
  Administered 2016-05-22: 4 mg via INTRAVENOUS

## 2016-05-22 MED ORDER — ONDANSETRON HCL 4 MG/2ML IJ SOLN
4.0000 mg | Freq: Once | INTRAMUSCULAR | Status: AC
  Administered 2016-05-22: 4 mg via INTRAVENOUS

## 2016-05-22 MED ORDER — OXYCODONE-ACETAMINOPHEN 5-325 MG PO TABS: 1.0000 | ORAL_TABLET | ORAL | Status: DC | PRN

## 2016-05-22 MED ORDER — DIATRIZOATE MEGLUMINE & SODIUM 66-10 % PO SOLN
15.0000 mL | Freq: Once | ORAL | Status: AC
  Administered 2016-05-22: 15 mL via ORAL

## 2016-05-22 NOTE — ED Provider Notes (Signed)
[facility] Emergency Department Provider Note  ____________________________________________  Time seen: 12:30 AM  I have reviewed the triage vital signs and the nursing notes.   HISTORY  Chief Complaint Abdominal Pain     HPI Beth Gibson is a 30 y.o. female presents with mid to lower abdominal discomfort accompanied by nausea and vomiting times approximately 5 days. Patient denies any diarrhea no fever.     Past Medical History  Diagnosis Date  . Chlamydia   . Trichomonas   . Preterm labor   . Headache(784.0)   . UTI (lower urinary tract infection)   . BV (bacterial vaginosis)   . Yeast vaginitis   . Abscess of axilla, right     Patient Active Problem List   Diagnosis Date Noted  . Severe episode of recurrent major depressive disorder, with psychotic features ([place])   . MDD (major depressive disorder) ([place]) 03/16/2016  . Microcytic anemia 11/01/2014  . Hyperkalemia 11/01/2014  . Axillary abscess 10/31/2014  . Status post repeat low transverse cesarean section 04/21/2012  . Obesity (BMI 30-39.9) 12/10/2011  . Previous cesarean delivery, antepartum condition or complication [id number]    Past Surgical History  Procedure Laterality Date  . Cyst removed from l buttock    . Cesarean section    . Wisdom tooth extraction    . Wisdom tooth extraction  2009  . Cystectomy  2009    left buttock  . Cesarean section  04/18/2012    Procedure: CESAREAN SECTION;  Surgeon: Guss Bunde, MD;  Location: [facility];  Service: Gynecology;  Laterality: N/A;  repeat  . Incision and drainage abscess Right 11/01/2014    Procedure: INCISION AND DRAINAGE RIGHT AXILLARY ABSCESS;  Surgeon: Leighton Ruff, MD;  Location: [facility];  Service: General;  Laterality: Right;  . Irrigation and debridement abscess Right 08/09/2015    Procedure: IRRIGATION AND DEBRIDEMENT AXILLARY ABSCESS;  Surgeon: Alphonsa Overall, MD;  Location: [facility];  Service: General;  Laterality: Right;     Current Outpatient Rx  Name  Route  Sig  Dispense  Refill  . FLUoxetine (PROZAC) 20 MG capsule   Oral   Take 1 capsule (20 mg total) by mouth daily. For depression   30 capsule   0   . hydrOXYzine (ATARAX/VISTARIL) 25 MG tablet   Oral   Take 1 tablet (25 mg total) by mouth every 6 (six) hours as needed for anxiety.   45 tablet   0   . ondansetron (ZOFRAN ODT) 4 MG disintegrating tablet   Oral   Take 1 tablet (4 mg total) by mouth every 8 (eight) hours as needed for nausea or vomiting.   20 tablet   0   . oxyCODONE-acetaminophen (ROXICET) 5-325 MG tablet   Oral   Take 1 tablet by mouth every 4 (four) hours as needed for severe pain.   20 tablet   0   . traZODone (DESYREL) 50 MG tablet   Oral   Take 1 tablet (50 mg total) by mouth at bedtime as needed for sleep.   30 tablet   0     Allergies Vicodin  Family History  Problem Relation Age of Onset  . Hypertension Mother   . Diabetes Mellitus I Father     Social History Social History  Substance Use Topics  . Smoking status: Current Some Day Smoker    Types: Cigars  . Smokeless tobacco: Never Used  . Alcohol Use: 0.5 oz/week    1 Standard  drinks or equivalent per week     Comment: "special occasions"    Review of Systems  Constitutional: Negative for fever. Eyes: Negative for visual changes. ENT: Negative for sore throat. Cardiovascular: Negative for chest pain. Respiratory: Negative for shortness of breath. Gastrointestinal: Positive for abdominal pain and vomiting Genitourinary: Negative for dysuria. Musculoskeletal: Negative for back pain. Skin: Negative for rash. Neurological: Negative for headaches, focal weakness or numbness.   10-point ROS otherwise negative.  ____________________________________________   PHYSICAL EXAM:  VITAL SIGNS: ED Triage Vitals  Enc Vitals Group     BP 05/21/16 2228 149/83 mmHg     Pulse Rate 05/21/16 2228 107     Resp 05/21/16 2228 18     Temp 05/21/16  2228 98.6 F (37 C)     Temp Source 05/21/16 2228 Oral     SpO2 05/21/16 2228 99 %     Weight 05/21/16 2228 198 lb (89.812 kg)     Height 05/21/16 2228 5\' 8"  (1.727 m)     Head Cir --      Peak Flow --      Pain Score 05/21/16 2234 10     Pain Loc --      Pain Edu? --      Excl. in [place]? --     Constitutional: Alert and oriented. Well appearing and in no distress. Eyes: Conjunctivae are normal. PERRL. Normal extraocular movements. ENT   Head: Normocephalic and atraumatic.   Nose: No congestion/rhinnorhea.   Mouth/Throat: Mucous membranes are moist.   Neck: No stridor. Hematological/Lymphatic/Immunilogical: No cervical lymphadenopathy. Cardiovascular: Normal rate, regular rhythm. Normal and symmetric distal pulses are present in all extremities. No murmurs, rubs, or gallops. Respiratory: Normal respiratory effort without tachypnea nor retractions. Breath sounds are clear and equal bilaterally. No wheezes/rales/rhonchi. Gastrointestinal: Right upper quadrant abdominal pain with palpation. No distention. There is no CVA tenderness. Genitourinary: Abrasion noted right labia majora, patient currently menstruating Musculoskeletal: Nontender with normal range of motion in all extremities. No joint effusions.  No lower extremity tenderness nor edema. Neurologic:  Normal speech and language. No gross focal neurologic deficits are appreciated. Speech is normal.  Skin:  Skin is warm, dry and intact. No rash noted. Psychiatric: Mood and affect are normal. Speech and behavior are normal. Patient exhibits appropriate insight and judgment.  ____________________________________________    LABS (pertinent positives/negatives)  Labs Reviewed  COMPREHENSIVE METABOLIC PANEL - Abnormal; Notable for the following:    Potassium 3.4 (*)    Total Protein 8.4 (*)    ALT 8 (*)    Total Bilirubin 0.2 (*)    All other components within normal limits  CBC - Abnormal; Notable for the following:     Hemoglobin 8.6 (*)    HCT 27.9 (*)    MCV 58.5 (*)    MCH 18.0 (*)    MCHC 30.8 (*)    RDW 20.2 (*)    All other components within normal limits  URINALYSIS COMPLETEWITH MICROSCOPIC ([facility] ONLY) - Abnormal; Notable for the following:    Color, Urine YELLOW (*)    APPearance CLEAR (*)    Hgb urine dipstick 3+ (*)    Squamous Epithelial / LPF 0-5 (*)    All other components within normal limits  WET PREP, GENITAL  CHLAMYDIA/NGC RT PCR ([facility] ONLY)  LIPASE, BLOOD  POCT PREGNANCY, URINE  POC URINE PREG, ED       RADIOLOGY   CT Abdomen Pelvis W Contrast (Final result) Result time: 05/22/16 01:45:34  Final result by Rad Results In Interface (05/22/16 01:45:34)   Narrative:   CLINICAL DATA: Right lower quadrant pain and nausea.  EXAM: CT ABDOMEN AND PELVIS WITH CONTRAST  TECHNIQUE: Multidetector CT imaging of the abdomen and pelvis was performed using the standard protocol following bolus administration of intravenous contrast.  CONTRAST: 198mL ISOVUE-300 IOPAMIDOL (ISOVUE-300) INJECTION 61%  COMPARISON: None.  FINDINGS: Lower chest: The included lung bases are clear.  Liver: Scattered subcentimeter hypodensities in the liver, too small to accurately characterize.  Hepatobiliary: Gallbladder decompressed, calcified gallstones are seen. No wall thickening or pericholecystic inflammation. No biliary dilatation.  Pancreas: No ductal dilatation or inflammation.  Spleen: Upper normal in size measuring 13 cm  Adrenal glands: No nodule.  Kidneys: Symmetric renal enhancement. No hydronephrosis.  Stomach/Bowel: Stomach physiologically distended. There are no dilated or thickened small bowel loops. Pelvic bowel loops are suboptimally assessed due to lack of enteric contrast. Moderate volume of stool throughout the colon without colonic wall thickening. Descending colon is tortuous. The appendix is normal.  Vascular/Lymphatic: No retroperitoneal  adenopathy. Abdominal aorta is normal in caliber.  Reproductive: Uterus is grossly normal. Ovaries not confidently identified by CT due to adjacent fluid-filled small bowel.  Bladder: Decompressed and not well evaluated.  Other: No free air, free fluid, or intra-abdominal fluid collection.  Musculoskeletal: There are no acute or suspicious osseous abnormalities.  IMPRESSION: 1. No acute abnormality. Normal appendix. 2. Cholelithiasis. No gallbladder inflammation.   Electronically Signed By: Jeb Levering M.D. On: 05/22/2016 01:45          INITIAL IMPRESSION / ASSESSMENT AND PLAN / ED COURSE  Pertinent labs & imaging results that were available during my care of the patient were reviewed by me and considered in my medical decision making (see chart for details).  Patient informed of all clinical findings and advised to follow-up with Dr. Kellie Simmering general surgeon regarding cholelithiasis.  ____________________________________________   FINAL CLINICAL IMPRESSION(S) / ED DIAGNOSES  Final diagnoses:  Calculus of gallbladder without cholecystitis without obstruction      Gregor Hams, MD 05/22/16 [phone]

## 2016-05-22 NOTE — ED Notes (Signed)
Pt refusing to drink contrast, EDP made aware and CT called to go ahead with scan

## 2016-05-22 NOTE — ED Notes (Signed)
Pt reports pain in her abd that "radiates into my vagina" reports burning and itching as well. States she noticed a bump "down there" as well

## 2016-05-22 NOTE — Discharge Instructions (Signed)

## 2016-05-22 NOTE — ED Notes (Signed)
Patient transported to CT 

## 2016-07-24 ENCOUNTER — Encounter: Payer: Self-pay | Admitting: Emergency Medicine

## 2016-07-24 ENCOUNTER — Emergency Department
Admission: EM | Admit: 2016-07-24 | Discharge: 2016-07-24 | Disposition: A | Discharge status: Home / Self Care | Payer: Medicaid Other | Attending: Student in an Organized Health Care Education/Training Program | Admitting: Student in an Organized Health Care Education/Training Program

## 2016-07-24 DIAGNOSIS — M542 Cervicalgia: Secondary | ICD-10-CM | POA: Diagnosis present

## 2016-07-24 DIAGNOSIS — Y999 Unspecified external cause status: Secondary | ICD-10-CM | POA: Insufficient documentation

## 2016-07-24 DIAGNOSIS — S134XXA Sprain of ligaments of cervical spine, initial encounter: Secondary | ICD-10-CM | POA: Diagnosis not present

## 2016-07-24 DIAGNOSIS — Y92410 Unspecified street and highway as the place of occurrence of the external cause: Secondary | ICD-10-CM | POA: Insufficient documentation

## 2016-07-24 DIAGNOSIS — Y939 Activity, unspecified: Secondary | ICD-10-CM | POA: Insufficient documentation

## 2016-07-24 DIAGNOSIS — F17210 Nicotine dependence, cigarettes, uncomplicated: Secondary | ICD-10-CM | POA: Insufficient documentation

## 2016-07-24 MED ORDER — METHOCARBAMOL 500 MG PO TABS: 500.0000 mg | ORAL_TABLET | Freq: Four times a day (QID) | ORAL | 0 refills | Status: DC | PRN

## 2016-07-24 MED ORDER — IBUPROFEN 800 MG PO TABS: 800.0000 mg | ORAL_TABLET | Freq: Three times a day (TID) | ORAL | 0 refills | Status: DC | PRN
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
## 2016-08-09 ENCOUNTER — Encounter (HOSPITAL_COMMUNITY): Payer: Self-pay

## 2016-08-09 ENCOUNTER — Emergency Department (HOSPITAL_COMMUNITY)
Admission: EM | Admit: 2016-08-09 | Discharge: 2016-08-10 | Disposition: A | Discharge status: Home / Self Care | Payer: Medicaid Other | Attending: Emergency Medicine | Admitting: Emergency Medicine

## 2016-08-09 ENCOUNTER — Emergency Department (HOSPITAL_COMMUNITY): Payer: Medicaid Other

## 2016-08-09 DIAGNOSIS — D649 Anemia, unspecified: Secondary | ICD-10-CM | POA: Insufficient documentation

## 2016-08-09 DIAGNOSIS — R1031 Right lower quadrant pain: Secondary | ICD-10-CM | POA: Insufficient documentation

## 2016-08-09 DIAGNOSIS — D251 Intramural leiomyoma of uterus: Secondary | ICD-10-CM | POA: Insufficient documentation

## 2016-08-09 DIAGNOSIS — F17210 Nicotine dependence, cigarettes, uncomplicated: Secondary | ICD-10-CM | POA: Diagnosis not present

## 2016-08-09 DIAGNOSIS — R109 Unspecified abdominal pain: Secondary | ICD-10-CM

## 2016-08-09 DIAGNOSIS — R1030 Lower abdominal pain, unspecified: Secondary | ICD-10-CM | POA: Diagnosis present

## 2016-08-09 DIAGNOSIS — N922 Excessive menstruation at puberty: Secondary | ICD-10-CM

## 2016-08-09 LAB — URINE MICROSCOPIC-ADD ON

## 2016-08-09 LAB — CBC WITH DIFFERENTIAL/PLATELET
BASOS PCT: 1 %
Basophils Absolute: 0 10*3/uL (ref 0.0–0.1)
EOS PCT: 3 %
Eosinophils Absolute: 0.1 10*3/uL (ref 0.0–0.7)
HCT: 28.7 % — ABNORMAL LOW (ref 36.0–46.0)
Hemoglobin: 8.3 g/dL — ABNORMAL LOW (ref 12.0–15.0)
LYMPHS ABS: 1.2 10*3/uL (ref 0.7–4.0)
Lymphocytes Relative: 32 %
MCH: 18.1 pg — AB (ref 26.0–34.0)
MCHC: 28.9 g/dL — AB (ref 30.0–36.0)
MCV: 62.5 fL — AB (ref 78.0–100.0)
MONO ABS: 0.2 10*3/uL (ref 0.1–1.0)
Monocytes Relative: 6 %
NEUTROS ABS: 2.3 10*3/uL (ref 1.7–7.7)
Neutrophils Relative %: 58 %
PLATELETS: 220 10*3/uL (ref 150–400)
RBC: 4.59 MIL/uL (ref 3.87–5.11)
RDW: 20.7 % — AB (ref 11.5–15.5)
WBC: 3.8 10*3/uL — ABNORMAL LOW (ref 4.0–10.5)

## 2016-08-09 LAB — BASIC METABOLIC PANEL
Anion gap: 5 (ref 5–15)
BUN: 6 mg/dL (ref 6–20)
CO2: 25 mmol/L (ref 22–32)
CREATININE: 0.66 mg/dL (ref 0.44–1.00)
Calcium: 8.8 mg/dL — ABNORMAL LOW (ref 8.9–10.3)
Chloride: 104 mmol/L (ref 101–111)
GFR calc Af Amer: >60 mL/min (ref >60)
GLUCOSE: 99 mg/dL (ref 65–99)
POTASSIUM: 4.1 mmol/L (ref 3.5–5.1)
Sodium: 134 mmol/L — ABNORMAL LOW (ref 135–145)

## 2016-08-09 LAB — URINALYSIS, ROUTINE W REFLEX MICROSCOPIC
GLUCOSE, UA: NEGATIVE mg/dL
KETONES UR: 15 mg/dL — AB
Nitrite: POSITIVE — AB
PH: 6 (ref 5.0–8.0)
Protein, ur: 100 mg/dL — AB
Specific Gravity, Urine: 1.023 (ref 1.005–1.030)

## 2016-08-09 LAB — WET PREP, GENITAL
Clue Cells Wet Prep HPF POC: NONE SEEN
Sperm: NONE SEEN
Trich, Wet Prep: NONE SEEN
Yeast Wet Prep HPF POC: NONE SEEN

## 2016-08-09 LAB — POC URINE PREG, ED: Preg Test, Ur: NEGATIVE

## 2016-08-09 MED ORDER — NAPROXEN 500 MG PO TABS: 500.0000 mg | ORAL_TABLET | Freq: Two times a day (BID) | ORAL | 0 refills | Status: DC

## 2016-08-09 MED ORDER — LIDOCAINE HCL (PF) 1 % IJ SOLN
INTRAMUSCULAR | Status: AC
  Administered 2016-08-09: 0.9 mL
  Filled 2016-08-09: qty 5

## 2016-08-09 MED ORDER — CEFTRIAXONE SODIUM 250 MG IJ SOLR
250.0000 mg | Freq: Once | INTRAMUSCULAR | Status: AC
  Administered 2016-08-09: 250 mg via INTRAMUSCULAR
  Filled 2016-08-09: qty 250

## 2016-08-09 MED ORDER — AZITHROMYCIN 250 MG PO TABS
1000.0000 mg | ORAL_TABLET | Freq: Once | ORAL | Status: AC
  Administered 2016-08-09: 1000 mg via ORAL
  Filled 2016-08-09: qty 4

## 2016-08-09 MED ORDER — AZITHROMYCIN 1 G PO PACK
1.0000 g | PACK | Freq: Once | ORAL | Status: DC
  Filled 2016-08-09: qty 1

## 2016-08-09 MED ORDER — KETOROLAC TROMETHAMINE 60 MG/2ML IM SOLN
60.0000 mg | Freq: Once | INTRAMUSCULAR | Status: AC
  Administered 2016-08-09: 60 mg via INTRAMUSCULAR
  Filled 2016-08-09: qty 2

## 2016-08-09 MED ORDER — TRAMADOL HCL 50 MG PO TABS: 50.0000 mg | ORAL_TABLET | Freq: Four times a day (QID) | ORAL | 0 refills | Status: DC | PRN

## 2016-08-09 NOTE — ED Notes (Addendum)
Pt ambulated to restroom from room, tolerated well. Pt back in room placed in gown and on monitor.

## 2016-08-09 NOTE — ED Notes (Signed)
Pt states she understands instructions and is discharged home stable  with steady gait.

## 2016-08-09 NOTE — ED Provider Notes (Signed)
[facility] DEPT Provider Note   CSN: [id number] Arrival date & time: 08/09/16  0932     History   Chief Complaint Chief Complaint  Patient presents with  . Abdominal Pain    HPI Beth Gibson is a 30 y.o. female.  HPI Beth Gibson is a 30 y.o. female with a history of sexually transmitted infection, UTIs, presents to emergency department complaining of lower abdominal pain and cramping. Patient states her pain started yesterday when she started her menstrual cycle. States pain is in the lower abdomen and radiates to the back bilaterally. She denies any nausea or vomiting. No fever or chills. No urinary symptoms. She states she has some vaginal discharge prior to onset of bleeding. She states that this. Is a few days early. She states it is much heavier than her normal period, states she goes through a pad every hour. She has taken ibuprofen with no relief of her pain. Patient denies pregnancy. Denies flank pain. No other complaints.   Past Medical History:  Diagnosis Date  . Abscess of axilla, right   . BV (bacterial vaginosis)   . Chlamydia   . Headache(784.0)   . Preterm labor   . Trichomonas   . UTI (lower urinary tract infection)   . Yeast vaginitis     Patient Active Problem List   Diagnosis Date Noted  . Severe episode of recurrent major depressive disorder, with psychotic features ([place])   . MDD (major depressive disorder) ([place]) 03/16/2016  . Microcytic anemia 11/01/2014  . Hyperkalemia 11/01/2014  . Axillary abscess 10/31/2014  . Status post repeat low transverse cesarean section 04/21/2012  . Obesity (BMI 30-39.9) 12/10/2011  . Previous cesarean delivery, antepartum condition or complication [id number]    Past Surgical History:  Procedure Laterality Date  . CESAREAN SECTION    . CESAREAN SECTION  04/18/2012   Procedure: CESAREAN SECTION;  Surgeon: Guss Bunde, MD;  Location: [facility];  Service: Gynecology;  Laterality: N/A;  repeat  . cyst removed from  L buttock    . CYSTECTOMY  2009   left buttock  . INCISION AND DRAINAGE ABSCESS Right 11/01/2014   Procedure: INCISION AND DRAINAGE RIGHT AXILLARY ABSCESS;  Surgeon: Leighton Ruff, MD;  Location: [facility];  Service: General;  Laterality: Right;  . IRRIGATION AND DEBRIDEMENT ABSCESS Right 08/09/2015   Procedure: IRRIGATION AND DEBRIDEMENT AXILLARY ABSCESS;  Surgeon: Alphonsa Overall, MD;  Location: [facility];  Service: General;  Laterality: Right;  . WISDOM TOOTH EXTRACTION    . WISDOM TOOTH EXTRACTION  2009    OB History    Gravida Para Term Preterm AB Living   3 2 0 2 1 2    SAB TAB Ectopic Multiple Live Births   1 0 0 0 2       Home Medications    Prior to Admission medications   Medication Sig Start Date End Date Taking? Authorizing Provider  FLUoxetine (PROZAC) 20 MG capsule Take 1 capsule (20 mg total) by mouth daily. For depression 03/18/16   Encarnacion Slates, NP  hydrOXYzine (ATARAX/VISTARIL) 25 MG tablet Take 1 tablet (25 mg total) by mouth every 6 (six) hours as needed for anxiety. 03/18/16   Encarnacion Slates, NP  ibuprofen (ADVIL,MOTRIN) 800 MG tablet Take 1 tablet (800 mg total) by mouth every 8 (eight) hours as needed. 07/24/16   Pierce Crane Beers, PA-C  methocarbamol (ROBAXIN) 500 MG tablet Take 1 tablet (500 mg total) by mouth every 6 (six) hours as needed  for muscle spasms. 07/24/16   Pierce Crane Beers, PA-C  ondansetron (ZOFRAN ODT) 4 MG disintegrating tablet Take 1 tablet (4 mg total) by mouth every 8 (eight) hours as needed for nausea or vomiting. 05/22/16   Gregor Hams, MD  oxyCODONE-acetaminophen (ROXICET) 5-325 MG tablet Take 1 tablet by mouth every 4 (four) hours as needed for severe pain. 05/22/16   Gregor Hams, MD  traZODone (DESYREL) 50 MG tablet Take 1 tablet (50 mg total) by mouth at bedtime as needed for sleep. 03/18/16   Encarnacion Slates, NP    Family History Family History  Problem Relation Age of Onset  . Hypertension Mother   . Diabetes Mellitus I Father     Social  History Social History  Substance Use Topics  . Smoking status: Current Some Day Smoker    Types: Cigars  . Smokeless tobacco: Never Used  . Alcohol use 0.5 oz/week    1 Standard drinks or equivalent per week     Comment: "special occasions"     Allergies   Vicodin [hydrocodone-acetaminophen]   Review of Systems Review of Systems  Constitutional: Negative for chills and fever.  Respiratory: Negative for cough, chest tightness and shortness of breath.   Cardiovascular: Negative for chest pain, palpitations and leg swelling.  Gastrointestinal: Positive for abdominal pain. Negative for constipation, diarrhea, nausea and vomiting.  Genitourinary: Positive for pelvic pain, vaginal bleeding and vaginal discharge. Negative for dysuria, flank pain and vaginal pain.  Musculoskeletal: Negative for arthralgias, myalgias, neck pain and neck stiffness.  Skin: Negative for rash.  Neurological: Negative for dizziness, weakness and headaches.  All other systems reviewed and are negative.    Physical Exam Updated Vital Signs BP 134/89 (BP Location: Right Arm)   Pulse 81   Temp 98.1 F (36.7 C) (Oral)   Resp 16   Ht 5\' 8"  (1.727 m)   Wt 85.7 kg   LMP 06/23/2016   SpO2 100%   BMI 28.74 kg/m   Physical Exam  Constitutional: She appears well-developed and well-nourished. No distress.  HENT:  Head: Normocephalic.  Eyes: Conjunctivae are normal.  Neck: Neck supple.  Cardiovascular: Normal rate, regular rhythm and normal heart sounds.   Pulmonary/Chest: Effort normal and breath sounds normal. No respiratory distress. She has no wheezes. She has no rales.  Abdominal: Soft. Bowel sounds are normal. She exhibits no distension. There is tenderness. There is no rebound.  RLQ, LLQ, suprapubic tenderness  Genitourinary:  Genitourinary Comments: Normal external genitalia. Normal vaginal canal. Moderate bleeding in vaginal canal. Cervix is normal, closed. No CMT. uterin tenderness. No adnexal  tenderness. No masses palpated.    Musculoskeletal: She exhibits no edema.  Neurological: She is alert.  Skin: Skin is warm and dry.  Psychiatric: She has a normal mood and affect. Her behavior is normal.  Nursing note and vitals reviewed.    ED Treatments / Results  Labs (all labs ordered are listed, but only abnormal results are displayed) Labs Reviewed  WET PREP, GENITAL - Abnormal; Notable for the following:       Result Value   WBC, Wet Prep HPF POC MANY (*)    All other components within normal limits  URINALYSIS, ROUTINE W REFLEX MICROSCOPIC (NOT AT [facility]) - Abnormal; Notable for the following:    Color, Urine RED (*)    APPearance TURBID (*)    Hgb urine dipstick LARGE (*)    Bilirubin Urine MODERATE (*)    Ketones, ur 15 (*)  Protein, ur 100 (*)    Nitrite POSITIVE (*)    Leukocytes, UA MODERATE (*)    All other components within normal limits  CBC WITH DIFFERENTIAL/PLATELET - Abnormal; Notable for the following:    WBC 3.8 (*)    Hemoglobin 8.3 (*)    HCT 28.7 (*)    MCV 62.5 (*)    MCH 18.1 (*)    MCHC 28.9 (*)    RDW 20.7 (*)    All other components within normal limits  BASIC METABOLIC PANEL - Abnormal; Notable for the following:    Sodium 134 (*)    Calcium 8.8 (*)    All other components within normal limits  URINE MICROSCOPIC-ADD ON - Abnormal; Notable for the following:    Squamous Epithelial / LPF 6-30 (*)    Bacteria, UA FEW (*)    All other components within normal limits  POC URINE PREG, ED  GC/CHLAMYDIA PROBE AMP ([place]) NOT AT [facility]    EKG  EKG Interpretation None       Radiology US Transvaginal Non-ob  Result Date: 08/09/2016 CLINICAL DATA:  Three-day history of pelvic pain EXAM: TRANSABDOMINAL AND TRANSVAGINAL ULTRASOUND OF PELVIS TECHNIQUE: Study was performed transabdominally to optimize pelvic field of view evaluation and transvaginally to optimize internal visceral architecture evaluation. COMPARISON:  CT abdomen and pelvis  May 22, 2016 FINDINGS: Uterus Measurements: 9.3 x 4.7 x 5.1 cm. The echotexture of the uterus is somewhat inhomogeneous without well-defined mass. Endometrium Thickness: 15 mm. Contour is smooth. No focal abnormality visualized. Right ovary Measurements: 3.4 x 2.0 x 3.4 cm. Normal appearance/no adnexal mass. Left ovary Measurements: 0.1 x 2.6 x 2.9 cm. Normal appearance/no adnexal mass. Other findings Trace free pelvic fluid. IMPRESSION: The uterus has an inhomogeneous echotexture without well-defined mass. This inhomogeneous echotexture may indicate leiomyomatous change without dominant mass. No endometrial lesions are evident. The ovaries appear normal in size and contour without extrauterine pelvic or adnexal mass. Minimal free pelvic fluid may be physiologic. Electronically Signed   By: Lowella Grip III M.D.   On: 08/09/2016 13:53   US Pelvis Complete  Result Date: 08/09/2016 CLINICAL DATA:  Three-day history of pelvic pain EXAM: TRANSABDOMINAL AND TRANSVAGINAL ULTRASOUND OF PELVIS TECHNIQUE: Study was performed transabdominally to optimize pelvic field of view evaluation and transvaginally to optimize internal visceral architecture evaluation. COMPARISON:  CT abdomen and pelvis May 22, 2016 FINDINGS: Uterus Measurements: 9.3 x 4.7 x 5.1 cm. The echotexture of the uterus is somewhat inhomogeneous without well-defined mass. Endometrium Thickness: 15 mm. Contour is smooth. No focal abnormality visualized. Right ovary Measurements: 3.4 x 2.0 x 3.4 cm. Normal appearance/no adnexal mass. Left ovary Measurements: 0.1 x 2.6 x 2.9 cm. Normal appearance/no adnexal mass. Other findings Trace free pelvic fluid. IMPRESSION: The uterus has an inhomogeneous echotexture without well-defined mass. This inhomogeneous echotexture may indicate leiomyomatous change without dominant mass. No endometrial lesions are evident. The ovaries appear normal in size and contour without extrauterine pelvic or adnexal mass. Minimal  free pelvic fluid may be physiologic. Electronically Signed   By: Lowella Grip III M.D.   On: 08/09/2016 13:53    Procedures Procedures (including critical care time)  Medications Ordered in ED Medications  azithromycin (ZITHROMAX) tablet 1,000 mg (not administered)  ketorolac (TORADOL) injection 60 mg (60 mg Intramuscular Given 08/09/16 1222)  cefTRIAXone (ROCEPHIN) injection 250 mg (250 mg Intramuscular Given 08/09/16 1423)  lidocaine (PF) (XYLOCAINE) 1 % injection (0.9 mLs  Given 08/09/16 1423)  Initial Impression / Assessment and Plan / ED Course  I have reviewed the triage vital signs and the nursing notes.  Pertinent labs & imaging results that were available during my care of the patient were reviewed by me and considered in my medical decision making (see chart for details).  Clinical Course  Patient in emergency department with painful menstrual bleeding, heavier than usual. Normal vital signs. No distress. Mild suprapubic tenderness on exam. Will perform pelvic exam, get ultrasound for further evaluation, get pregnancy test. Patient is driving, Toradol given for pain.   Patient's pelvic exam does show moderate bleeding and some uterine tenderness, otherwise unremarkable. Hemoglobin is 8.3, appears to be at baseline for her. Asymptomatic. Many white blood cells on wet prep. Will treat for STD, given history of the same. Rocephin 250 mg and Zithromax 1 g given in emergency department. Ultrasound of pelvis performed, showed inhomogenous echotexture without well-defined mass, consistent with possible leiomyomatous changes. Otherwise negative ultrasound. Discuss results with patient. Will give naproxen and tramadol for cramping. Will refer to OB/GYN for further follow-up for menorrhagia. Return precautions discussed.    Final Clinical Impressions(s) / ED Diagnoses   Final diagnoses:  Abdominal pain  Intramural leiomyoma of uterus  Excessive menstruation at puberty  Anemia,  unspecified anemia type    New Prescriptions New Prescriptions   NAPROXEN (NAPROSYN) 500 MG TABLET    Take 1 tablet (500 mg total) by mouth 2 (two) times daily.   TRAMADOL (ULTRAM) 50 MG TABLET    Take 1 tablet (50 mg total) by mouth every 6 (six) hours as needed.     Jeannett Senior, PA-C 08/09/16 [place], MD 08/13/16 1332

## 2016-08-09 NOTE — Discharge Instructions (Signed)
Take naprosyn for pain. Take tramadol for severe pain. Your US showed leiomyoma of the uterus. Please follow up with OB/GYN for further evaluation and treatment.

## 2016-08-09 NOTE — ED Notes (Signed)
Patient transported to Ultrasound 

## 2016-08-09 NOTE — ED Notes (Signed)
Pelvic cart setup at bedside. Pt undressed.

## 2016-08-09 NOTE — ED Triage Notes (Signed)
Pt arrives ambulatory with c/o lower abdominal pain radiating to back x 2 days with menses started yesterday. Pt c/o nausea without vomiting and denies diarrhea.

## 2016-08-10 LAB — GC/CHLAMYDIA PROBE AMP (~~LOC~~) NOT AT ARMC
Chlamydia: NEGATIVE
NEISSERIA GONORRHEA: NEGATIVE

## 2016-09-18 ENCOUNTER — Ambulatory Visit (INDEPENDENT_AMBULATORY_CARE_PROVIDER_SITE_OTHER): Payer: Medicaid Other | Admitting: Obstetrics and Gynecology

## 2016-09-18 ENCOUNTER — Encounter: Payer: Self-pay | Admitting: Obstetrics and Gynecology

## 2016-09-18 VITALS — BP 126/74 | HR 96 | Wt 198.0 lb

## 2016-09-18 DIAGNOSIS — R1031 Right lower quadrant pain: Secondary | ICD-10-CM

## 2016-09-18 DIAGNOSIS — N939 Abnormal uterine and vaginal bleeding, unspecified: Secondary | ICD-10-CM

## 2016-09-18 MED ORDER — NORETHIN ACE-ETH ESTRAD-FE 1-20 MG-MCG(24) PO TABS: 1.0000 | ORAL_TABLET | Freq: Every day | ORAL | 11 refills | Status: DC

## 2016-09-18 MED ORDER — KETOROLAC TROMETHAMINE 10 MG PO TABS: 10.0000 mg | ORAL_TABLET | Freq: Four times a day (QID) | ORAL | 0 refills | Status: DC | PRN

## 2016-09-18 NOTE — Progress Notes (Signed)
Obstetrics and Gynecology Visit New Patient Evaluation  Appointment Date: 09/18/2016  OBGYN Clinic: [facility]  Primary Care Provider: [facility]  Referring Provider: [facility]  Chief Complaint: follow up ER visit History of Present Illness: Beth Gibson is a 30 y.o. African-American [id number] (LMP: 9/20), seen for the above chief complaint. Her past medical history is significant for h/o c-section x 2, BMI 30, anemia, STIs, tobacco use.   Patient to [facility] ER on 8/20 for abdominal pain and was given rocephin and zithromax. She had an u/s which was negative but she was told she had fibroids. She had a negative wet prep and GC/CT, u/a.  She states that her abdominal pain started about a year ago. She states that it's RLQ and feels like cramps/she's having a baby and worsens when she's on her period. She has pain every day outside of her period in that same area that comes and goes. It doesn't radiate and gets better with pain medications. She also has a long standing history of AUB and had an appt at [facility] to go over this and her pain. Last visit there was sometime this year. Also, of note, she's had a CT scan done at [facility] a few months ago for this and it was negative except for gallstones.   No fevers, chills, nausea, vomiting, concordance with food, dysuria, hematuria, vaginal discharge or itching, diarrhea, constipation or blood in BMs.   Review of Systems: Her 12 point review of systems is negative or as noted in the History of Present Illness.  Past Medical History:  Past Medical History:  Diagnosis Date  . Abscess of axilla, right   . BV (bacterial vaginosis)   . Chlamydia   . Headache(784.0)   . Preterm labor   . Trichomonas   . UTI (lower urinary tract infection)   . Yeast vaginitis     Past Surgical History:  Past Surgical History:  Procedure Laterality Date  . CESAREAN SECTION    . CESAREAN SECTION  04/18/2012   Procedure: CESAREAN SECTION;  Surgeon:  Guss Bunde, MD;  Location: [facility];  Service: Gynecology;  Laterality: N/A;  repeat  . cyst removed from L buttock    . CYSTECTOMY  2009   left buttock  . INCISION AND DRAINAGE ABSCESS Right 11/01/2014   Procedure: INCISION AND DRAINAGE RIGHT AXILLARY ABSCESS;  Surgeon: Leighton Ruff, MD;  Location: [facility];  Service: General;  Laterality: Right;  . IRRIGATION AND DEBRIDEMENT ABSCESS Right 08/09/2015   Procedure: IRRIGATION AND DEBRIDEMENT AXILLARY ABSCESS;  Surgeon: Alphonsa Overall, MD;  Location: [facility];  Service: General;  Laterality: Right;  . WISDOM TOOTH EXTRACTION    . WISDOM TOOTH EXTRACTION  2009    Past Obstetrical History:  OB History  Gravida Para Term Preterm AB Living  3 2 0 2 1 2   SAB TAB Ectopic Multiple Live Births  1 0 0 0 2    # Outcome Date GA Lbr Len/2nd Weight Sex Delivery Anes PTL Lv  3 Preterm 04/18/12 [redacted]w[redacted]d  5 lb 6.6 oz (2.455 kg) F CS-LTranv Spinal  LIV  2 Preterm 11/30/10 [redacted]w[redacted]d  3 lb 1 oz (1.389 kg) M CS-Classical EPI  LIV  1 SAB               Past Gynecological History: As per HPI. Patient states she believes last pap at [facility] this year was normal. Periods can come q1-3wks Only BC used was depo prior to her last  kids On nothing for [facility] and last intercourse was a month ago.   Social History:  Social History   Social History  . Marital status: Single    Spouse name: N/A  . Number of children: N/A  . Years of education: N/A   Occupational History  . Not on file.   Social History Main Topics  . Smoking status: Current Some Day Smoker    Types: Cigars  . Smokeless tobacco: Never Used  . Alcohol use 0.5 oz/week    1 Standard drinks or equivalent per week     Comment: "special occasions"  . Drug use:     Frequency: 3.0 times per week     Comment: Molly, Ectasy   . Sexual activity: Yes    Birth control/ protection: None     Comment: Taking pill "somtimes", last taken 2 weeks ago   Other Topics Concern  . Not on file   Social History  Narrative  . No narrative on file    Family History:  Family History  Problem Relation Age of Onset  . Hypertension Mother   . Diabetes Mellitus I Father    She denies any female cancers, bleeding or blood clotting disorders.   Medications None  Allergies Vicodin [hydrocodone-acetaminophen]   Physical Exam:  BP 126/74   Pulse 96   Wt 198 lb (89.8 kg)   LMP 09/17/2016   BMI 30.11 kg/m  Body mass index is 30.11 kg/m. General appearance: Well nourished, well developed female in no acute distress.  Neck:  Supple, normal appearance, and no thyromegaly  Cardiovascular: normal s1 and s2.  No murmurs, rubs or gallops. Respiratory:  Clear to auscultation bilateral. Normal respiratory effort Abdomen: positive bowel sounds and no masses, hernias; diffusely non tender to palpation, non distended. Well healed low transverse skin incision Neuro/Psych:  Normal mood and affect.  Skin:  Warm and dry.  Lymphatic:  No inguinal lymphadenopathy.   Pelvic exam: is not limited by body habitus EGBUS: within normal limits, Vagina: within normal limits and with no blood in the vault, Cervix: normal appearing cervix without discharge or lesions. Uterus:  nonenlarged, and Adnexa:  normal adnexa and no mass, fullness, tenderness Rectovaginal: deferred  Laboratory: UPT negative  Radiology:   *Bedside u/s today negative (see note)  *CLINICAL DATA:  Three-day history of pelvic pain  EXAM: TRANSABDOMINAL AND TRANSVAGINAL ULTRASOUND OF PELVIS  TECHNIQUE: Study was performed transabdominally to optimize pelvic field of view evaluation and transvaginally to optimize internal visceral architecture evaluation.  COMPARISON:  CT abdomen and pelvis May 22, 2016  FINDINGS: Uterus  Measurements: 9.3 x 4.7 x 5.1 cm. The echotexture of the uterus is somewhat inhomogeneous without well-defined mass.  Endometrium  Thickness: 15 mm. Contour is smooth. No focal  abnormality visualized.  Right ovary  Measurements: 3.4 x 2.0 x 3.4 cm. Normal appearance/no adnexal mass.  Left ovary  Measurements: 0.1 x 2.6 x 2.9 cm. Normal appearance/no adnexal mass.  Other findings  Trace free pelvic fluid.  IMPRESSION: The uterus has an inhomogeneous echotexture without well-defined mass. This inhomogeneous echotexture may indicate leiomyomatous change without dominant mass. No endometrial lesions are evident.  The ovaries appear normal in size and contour without extrauterine pelvic or adnexal mass. Minimal free pelvic fluid may be physiologic.   Electronically Signed   By: Lowella Grip III M.D.   On: 08/09/2016 13:53    Assessment: chronic RLQ pain, AUB; pt stable  Plan:  D/w pt possible etiologies and recommend trying  something to help with her AUB. I told her that her u/s and CT was negative in the past and her u/s is negative today and if her pain is worse with her periods then if we can control her AUB then it may control her pain; also, if she has endo, then OCPs would be first line trial med anyways. I told her start the OCPs and wait a week before considering it effective and to give it 3-56m to see if it improves her AUB and pain; after d/w her and r/b/a, including VTE risk, pt is amenable to trial.  Will do loestrin (mid range EE dose) and see her back in a few months for f/u. Toradol IM offered but she'd like to do PO, so this was sent in.   RTC 3-45m f/u AUB and pain.   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

## 2016-09-18 NOTE — Procedures (Signed)
Transvaginal Ultrasound Note  Uterus: 8.7 x 5.5 x 4.2cm, midplane  ES: thin, homogenous 27mm  RO: 2.6 x 1.9 x 2.7cm with multiple follicles LO: 2.5 x 2.5 x 2.1cm with multiple follicles  No free fluid, fibroids, distortion of the endometrium seen.   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

## 2016-09-18 NOTE — Patient Instructions (Signed)
Start the birth control pill on Sunday and then can consider effective against pregnancy in 1 week

## 2016-10-17 ENCOUNTER — Emergency Department (HOSPITAL_COMMUNITY)
Admission: EM | Admit: 2016-10-17 | Discharge: 2016-10-17 | Disposition: A | Discharge status: Home / Self Care | Payer: Medicaid Other | Attending: Emergency Medicine | Admitting: Emergency Medicine

## 2016-10-17 ENCOUNTER — Encounter (HOSPITAL_COMMUNITY): Payer: Self-pay | Admitting: *Deleted

## 2016-10-17 DIAGNOSIS — K047 Periapical abscess without sinus: Secondary | ICD-10-CM

## 2016-10-17 DIAGNOSIS — F17290 Nicotine dependence, other tobacco product, uncomplicated: Secondary | ICD-10-CM | POA: Insufficient documentation

## 2016-10-17 DIAGNOSIS — Z79899 Other long term (current) drug therapy: Secondary | ICD-10-CM | POA: Insufficient documentation

## 2016-10-17 DIAGNOSIS — K0889 Other specified disorders of teeth and supporting structures: Secondary | ICD-10-CM | POA: Diagnosis present

## 2016-10-17 MED ORDER — PENICILLIN V POTASSIUM 500 MG PO TABS: 500.0000 mg | ORAL_TABLET | Freq: Four times a day (QID) | ORAL | 0 refills | Status: DC

## 2016-10-17 MED ORDER — IBUPROFEN 800 MG PO TABS
800.0000 mg | ORAL_TABLET | Freq: Once | ORAL | Status: AC
  Administered 2016-10-17: 800 mg via ORAL
  Filled 2016-10-17: qty 1

## 2016-10-17 NOTE — ED Triage Notes (Signed)
Pt complains of right lower tooth abscess for the past 3 days. Pain is 10/10

## 2016-10-17 NOTE — ED Provider Notes (Signed)
[facility] DEPT Provider Note   CSN: [id number] Arrival date & time: 10/17/16  1859   By signing my name below, I, Maren Reamer, attest that this documentation has been prepared under the direction and in the presence of Montine Circle, PA-C.  Electronically Signed: Maren Reamer, Scribe. 10/17/16. 8:30 PM.   History   Chief Complaint Chief Complaint  Patient presents with  . Dental Pain   The history is provided by the patient. No language interpreter was used.    HPI Comments: Beth Gibson is a 30 y.o. female who presents to the Emergency Department complaining of right lower dental pain onset 3 days ago. Rates her pain currently at a 10/10. Feels a bubble by the tooth that she noticed 3 days ago. Does not have a regular dentist and goes to walk-in clinics as needed. This has been keeping her up at night.   Past Medical History:  Diagnosis Date  . Abscess of axilla, right   . BV (bacterial vaginosis)   . Chlamydia   . Cholelithiases   . Headache(784.0)   . Preterm labor   . Trichomonas   . UTI (lower urinary tract infection)   . Yeast vaginitis     Patient Active Problem List   Diagnosis Date Noted  . Severe episode of recurrent major depressive disorder, with psychotic features ([place])   . MDD (major depressive disorder) 03/16/2016  . Microcytic anemia 11/01/2014  . Hyperkalemia 11/01/2014  . Axillary abscess 10/31/2014  . Status post repeat low transverse cesarean section 04/21/2012  . Obesity (BMI 30-39.9) 12/10/2011  . Previous cesarean delivery, antepartum condition or complication [id number]    Past Surgical History:  Procedure Laterality Date  . CESAREAN SECTION    . CESAREAN SECTION  04/18/2012   Procedure: CESAREAN SECTION;  Surgeon: Guss Bunde, MD;  Location: [facility];  Service: Gynecology;  Laterality: N/A;  repeat  . cyst removed from L buttock    . CYSTECTOMY  2009   left buttock  . INCISION AND DRAINAGE ABSCESS Right 11/01/2014   Procedure:  INCISION AND DRAINAGE RIGHT AXILLARY ABSCESS;  Surgeon: Leighton Ruff, MD;  Location: [facility];  Service: General;  Laterality: Right;  . IRRIGATION AND DEBRIDEMENT ABSCESS Right 08/09/2015   Procedure: IRRIGATION AND DEBRIDEMENT AXILLARY ABSCESS;  Surgeon: Alphonsa Overall, MD;  Location: [facility];  Service: General;  Laterality: Right;  . WISDOM TOOTH EXTRACTION    . WISDOM TOOTH EXTRACTION  2009    OB History    Gravida Para Term Preterm AB Living   3 2 0 2 1 2    SAB TAB Ectopic Multiple Live Births   1 0 0 0 2       Home Medications    Prior to Admission medications   Medication Sig Start Date End Date Taking? Authorizing Provider  ibuprofen (ADVIL,MOTRIN) 800 MG tablet Take 1 tablet (800 mg total) by mouth every 8 (eight) hours as needed. 07/24/16   Pierce Crane Beers, PA-C  ketorolac (TORADOL) 10 MG tablet Take 1 tablet (10 mg total) by mouth every 6 (six) hours as needed. 09/18/16   Aletha Halim, MD  Norethindrone Acetate-Ethinyl Estrad-FE (LOESTRIN 24 FE) 1-20 MG-MCG(24) tablet Take 1 tablet by mouth daily. 09/18/16   Aletha Halim, MD    Family History Family History  Problem Relation Age of Onset  . Hypertension Mother   . Diabetes Mellitus I Father     Social History Social History  Substance Use Topics  . Smoking status: Current  Some Day Smoker    Types: Cigars  . Smokeless tobacco: Never Used  . Alcohol use 0.5 oz/week    1 Standard drinks or equivalent per week     Comment: "special occasions"     Allergies   Vicodin [hydrocodone-acetaminophen]   Review of Systems Review of Systems  HENT:       Right lower dental pain  Psychiatric/Behavioral: Positive for sleep disturbance.     Physical Exam Updated Vital Signs BP 134/89 (BP Location: Left Arm)   Pulse 87   Temp 98.9 F (37.2 C) (Oral)   Resp 18   LMP 09/16/2016   SpO2 100%   Physical Exam Physical Exam  Constitutional: Pt appears well-developed and well-nourished.  HENT:  Head: Normocephalic.    Right Ear: Tympanic membrane, external ear and ear canal normal.  Left Ear: Tympanic membrane, external ear and ear canal normal.  Nose: Nose normal. Right sinus exhibits no maxillary sinus tenderness and no frontal sinus tenderness. Left sinus exhibits no maxillary sinus tenderness and no frontal sinus tenderness.  Mouth/Throat: Uvula is midline, oropharynx is clear and moist and mucous membranes are normal. No oral lesions. No uvula swelling or lacerations. No oropharyngeal exudate, posterior oropharyngeal edema, posterior oropharyngeal erythema or tonsillar abscesses.  Poor dentition No gingival swelling, fluctuance or induration No gross abscess  No sublingual edema, tenderness to palpation, or sign of Ludwig's angina, or deep space infection Pain at right lower premolar Eyes: Conjunctivae are normal. Pupils are equal, round, and reactive to light. Right eye exhibits no discharge. Left eye exhibits no discharge.  Neck: Normal range of motion. Neck supple.  No stridor Handling secretions without difficulty No nuchal rigidity No cervical lymphadenopathy Cardiovascular: Normal rate, regular rhythm and normal heart sounds.   Pulmonary/Chest: Effort normal. No respiratory distress.  Equal chest rise  Abdominal: Soft. Bowel sounds are normal. Pt exhibits no distension. There is no tenderness.  Lymphadenopathy: Pt has no cervical adenopathy.  Neurological: Pt is alert and oriented x 4  Skin: Skin is warm and dry.  Psychiatric: Pt has a normal mood and affect.  Nursing note and vitals reviewed.     ED Treatments / Results  DIAGNOSTIC STUDIES: Oxygen Saturation is 100% on RA, normal by my interpretation.    COORDINATION OF CARE: 8:27 PM Discussed treatment plan with pt at bedside and pt agreed to plan.  Labs (all labs ordered are listed, but only abnormal results are displayed) Labs Reviewed - No data to display  EKG  EKG Interpretation None       Radiology No results  found.  Procedures Procedures (including critical care time)  Medications Ordered in ED Medications  ibuprofen (ADVIL,MOTRIN) tablet 800 mg (not administered)     Initial Impression / Assessment and Plan / ED Course  I have reviewed the triage vital signs and the nursing notes.  Pertinent labs & imaging results that were available during my care of the patient were reviewed by me and considered in my medical decision making (see chart for details).  Clinical Course    Patient with dentalgia.  No abscess requiring immediate incision and drainage.  Exam not concerning for Ludwig's angina or pharyngeal abscess.  Will treat with penicillin. Pt instructed to follow-up with dentist.  Discussed return precautions. Pt safe for discharge.   Final Clinical Impressions(s) / ED Diagnoses   Final diagnoses:  Dental infection   I personally performed the services described in this documentation, which was scribed in my presence. The recorded  information has been reviewed and is accurate.     New Prescriptions New Prescriptions   PENICILLIN V POTASSIUM (VEETID) 500 MG TABLET    Take 1 tablet (500 mg total) by mouth 4 (four) times daily.     Montine Circle, PA-C 10/17/16 2034    Quintella Reichert, MD 10/18/16 1500

## 2016-11-22 ENCOUNTER — Encounter (HOSPITAL_COMMUNITY): Payer: Self-pay

## 2016-11-22 ENCOUNTER — Inpatient Hospital Stay (HOSPITAL_COMMUNITY)
Admission: AD | Admit: 2016-11-22 | Discharge: 2016-11-22 | Disposition: A | Discharge status: Home / Self Care | Payer: Medicaid Other | Source: Ambulatory Visit | Attending: Obstetrics & Gynecology | Admitting: Obstetrics & Gynecology

## 2016-11-22 ENCOUNTER — Inpatient Hospital Stay (HOSPITAL_COMMUNITY): Payer: Medicaid Other

## 2016-11-22 DIAGNOSIS — O9989 Other specified diseases and conditions complicating pregnancy, childbirth and the puerperium: Secondary | ICD-10-CM

## 2016-11-22 DIAGNOSIS — N760 Acute vaginitis: Secondary | ICD-10-CM | POA: Insufficient documentation

## 2016-11-22 DIAGNOSIS — B9689 Other specified bacterial agents as the cause of diseases classified elsewhere: Secondary | ICD-10-CM | POA: Diagnosis not present

## 2016-11-22 DIAGNOSIS — O26899 Other specified pregnancy related conditions, unspecified trimester: Secondary | ICD-10-CM

## 2016-11-22 DIAGNOSIS — O26891 Other specified pregnancy related conditions, first trimester: Secondary | ICD-10-CM | POA: Insufficient documentation

## 2016-11-22 DIAGNOSIS — O99011 Anemia complicating pregnancy, first trimester: Secondary | ICD-10-CM | POA: Diagnosis not present

## 2016-11-22 DIAGNOSIS — O23591 Infection of other part of genital tract in pregnancy, first trimester: Secondary | ICD-10-CM | POA: Insufficient documentation

## 2016-11-22 DIAGNOSIS — R109 Unspecified abdominal pain: Secondary | ICD-10-CM | POA: Diagnosis not present

## 2016-11-22 DIAGNOSIS — F17290 Nicotine dependence, other tobacco product, uncomplicated: Secondary | ICD-10-CM | POA: Diagnosis not present

## 2016-11-22 DIAGNOSIS — Z3A01 Less than 8 weeks gestation of pregnancy: Secondary | ICD-10-CM | POA: Insufficient documentation

## 2016-11-22 DIAGNOSIS — O99331 Smoking (tobacco) complicating pregnancy, first trimester: Secondary | ICD-10-CM | POA: Diagnosis not present

## 2016-11-22 DIAGNOSIS — D638 Anemia in other chronic diseases classified elsewhere: Secondary | ICD-10-CM | POA: Insufficient documentation

## 2016-11-22 LAB — CBC
HEMATOCRIT: 25.3 % — AB (ref 36.0–46.0)
Hemoglobin: 7.5 g/dL — ABNORMAL LOW (ref 12.0–15.0)
MCH: 17.4 pg — ABNORMAL LOW (ref 26.0–34.0)
MCHC: 29.6 g/dL — ABNORMAL LOW (ref 30.0–36.0)
MCV: 58.7 fL — ABNORMAL LOW (ref 78.0–100.0)
PLATELETS: 254 10*3/uL (ref 150–400)
RBC: 4.31 MIL/uL (ref 3.87–5.11)
RDW: 19.4 % — AB (ref 11.5–15.5)
WBC: 4.4 10*3/uL (ref 4.0–10.5)

## 2016-11-22 LAB — URINALYSIS, ROUTINE W REFLEX MICROSCOPIC
Bilirubin Urine: NEGATIVE
Glucose, UA: NEGATIVE mg/dL
Hgb urine dipstick: NEGATIVE
Ketones, ur: NEGATIVE mg/dL
Nitrite: NEGATIVE
Protein, ur: NEGATIVE mg/dL
Specific Gravity, Urine: <1.005 — ABNORMAL LOW (ref 1.005–1.030)
pH: 6 (ref 5.0–8.0)

## 2016-11-22 LAB — WET PREP, GENITAL
Sperm: NONE SEEN
TRICH WET PREP: NONE SEEN
YEAST WET PREP: NONE SEEN

## 2016-11-22 LAB — HCG, QUANTITATIVE, PREGNANCY: HCG, BETA CHAIN, QUANT, S: 10058 m[IU]/mL — AB (ref <5)

## 2016-11-22 LAB — URINE MICROSCOPIC-ADD ON: RBC / HPF: NONE SEEN RBC/hpf (ref 0–5)

## 2016-11-22 LAB — POCT PREGNANCY, URINE: Preg Test, Ur: POSITIVE — AB

## 2016-11-22 MED ORDER — CONCEPT OB 130-92.4-1 MG PO CAPS: 1.0000 | ORAL_CAPSULE | Freq: Every day | ORAL | 12 refills | Status: DC

## 2016-11-22 MED ORDER — METRONIDAZOLE 500 MG PO TABS: 500.0000 mg | ORAL_TABLET | Freq: Two times a day (BID) | ORAL | 0 refills | Status: DC

## 2016-11-22 MED ORDER — ACETAMINOPHEN 500 MG PO TABS: 1000.0000 mg | ORAL_TABLET | Freq: Four times a day (QID) | ORAL | 0 refills | Status: DC | PRN

## 2016-11-22 NOTE — MAU Provider Note (Signed)
Chief Complaint: Abdominal Pain and Back Pain   First Provider Initiated Contact with Patient 11/22/16 1436     SUBJECTIVE HPI: Beth Gibson is a 30 y.o. [id number] at [redacted]w[redacted]d by LMP who presents to Maternity Admissions reporting pos home UPT, low abd and low back pain x 5 days and one episode of vomiting.   Location: Low back wrapping around to low abd. Quality: cramping Severity: 10/10 on pain scale Duration: 5 days Course: unchanged Context: Early pregnancy Timing: constant Modifying factors: None hasn't tried anything for the pain.  Associated signs and symptoms: Pos for vomiting x 1. Neg for fever, chills, VB, vaginal discharge, urinary complaints, diarrhea or constipation.   Past Medical History:  Diagnosis Date  . Abscess of axilla, right   . BV (bacterial vaginosis)   . Chlamydia   . Cholelithiases   . Headache(784.0)   . Preterm labor   . Trichomonas   . UTI (lower urinary tract infection)   . Yeast vaginitis    OB History  Gravida Para Term Preterm AB Living  4 2 0 2 1 2   SAB TAB Ectopic Multiple Live Births  1 0 0 0 2    # Outcome Date GA Lbr Len/2nd Weight Sex Delivery Anes PTL Lv  4 Current           3 Preterm 04/18/12 [redacted]w[redacted]d  5 lb 6.6 oz (2.455 kg) F CS-LTranv Spinal  LIV  2 Preterm 11/30/10 [redacted]w[redacted]d  3 lb 1 oz (1.389 kg) M CS-Classical EPI  LIV  1 SAB              Past Surgical History:  Procedure Laterality Date  . CESAREAN SECTION    . CESAREAN SECTION  04/18/2012   Procedure: CESAREAN SECTION;  Surgeon: Guss Bunde, MD;  Location: [facility];  Service: Gynecology;  Laterality: N/A;  repeat  . cyst removed from L buttock    . CYSTECTOMY  2009   left buttock  . INCISION AND DRAINAGE ABSCESS Right 11/01/2014   Procedure: INCISION AND DRAINAGE RIGHT AXILLARY ABSCESS;  Surgeon: Leighton Ruff, MD;  Location: [facility];  Service: General;  Laterality: Right;  . IRRIGATION AND DEBRIDEMENT ABSCESS Right 08/09/2015   Procedure: IRRIGATION AND DEBRIDEMENT AXILLARY  ABSCESS;  Surgeon: Alphonsa Overall, MD;  Location: [facility];  Service: General;  Laterality: Right;  . WISDOM TOOTH EXTRACTION    . WISDOM TOOTH EXTRACTION  2009   Social History   Social History  . Marital status: Single    Spouse name: N/A  . Number of children: N/A  . Years of education: N/A   Occupational History  . Not on file.   Social History Main Topics  . Smoking status: Current Some Day Smoker    Types: Cigars  . Smokeless tobacco: Never Used  . Alcohol use No  . Drug use:     Frequency: 3.0 times per week    Types: Marijuana  . Sexual activity: Yes    Birth control/ protection: None   Other Topics Concern  . Not on file   Social History Narrative  . No narrative on file   No current facility-administered medications on file prior to encounter.    No current outpatient prescriptions on file prior to encounter.   Allergies  Allergen Reactions  . Vicodin [Hydrocodone-Acetaminophen] Itching    I have reviewed the past Medical Hx, Surgical Hx, Social Hx, Allergies and Medications.   Review of Systems  Constitutional: Negative for appetite change,  chills and fever.  Gastrointestinal: Positive for abdominal pain, nausea and vomiting. Negative for constipation and diarrhea.  Genitourinary: Negative for dyspareunia, dysuria, flank pain, frequency, hematuria, urgency, vaginal bleeding and vaginal discharge.  Musculoskeletal: Negative for back pain.    OBJECTIVE Patient Vitals for the past 24 hrs:  BP Temp Temp src Pulse Resp Height Weight  11/22/16 1353 125/71 99 F (37.2 C) Oral 89 18 - -  11/22/16 1346 - - - - - 5\' 8"  (1.727 m) 208 lb (94.3 kg)   Constitutional: Well-developed, well-nourished female in no acute distress.  Cardiovascular: normal rate Respiratory: normal rate and effort.  GI: Abd soft, non-tender. Pos BS x 4 MS: Extremities nontender, no edema, normal ROM Neurologic: Alert and oriented x 4.  GU: Neg CVAT.  SPECULUM EXAM: NEFG, physiologic  discharge, no blood noted, cervix clean  BIMANUAL: cervix closed and long; uterus top-normal size, no adnexal tenderness or masses. No CMT.  LAB RESULTS Results for orders placed or performed during the hospital encounter of 11/22/16 (from the past 24 hour(s))  Urinalysis, Routine w reflex microscopic (not at [facility])     Status: Abnormal   Collection Time: 11/22/16  1:43 PM  Result Value Ref Range   Color, Urine YELLOW YELLOW   APPearance CLEAR CLEAR   Specific Gravity, Urine <1.005 (L) 1.005 - 1.030   pH 6.0 5.0 - 8.0   Glucose, UA NEGATIVE NEGATIVE mg/dL   Hgb urine dipstick NEGATIVE NEGATIVE   Bilirubin Urine NEGATIVE NEGATIVE   Ketones, ur NEGATIVE NEGATIVE mg/dL   Protein, ur NEGATIVE NEGATIVE mg/dL   Nitrite NEGATIVE NEGATIVE   Leukocytes, UA TRACE (A) NEGATIVE  Urine microscopic-add on     Status: Abnormal   Collection Time: 11/22/16  1:43 PM  Result Value Ref Range   Squamous Epithelial / LPF 0-5 (A) NONE SEEN   WBC, UA 0-5 0 - 5 WBC/hpf   RBC / HPF NONE SEEN 0 - 5 RBC/hpf   Bacteria, UA FEW (A) NONE SEEN  Pregnancy, urine POC     Status: Abnormal   Collection Time: 11/22/16  1:59 PM  Result Value Ref Range   Preg Test, Ur POSITIVE (A) NEGATIVE  hCG, quantitative, pregnancy     Status: Abnormal   Collection Time: 11/22/16  2:29 PM  Result Value Ref Range   hCG, Beta Chain, Quant, S 10,058 (H) <5 mIU/mL  CBC     Status: Abnormal   Collection Time: 11/22/16  2:29 PM  Result Value Ref Range   WBC 4.4 4.0 - 10.5 K/uL   RBC 4.31 3.87 - 5.11 MIL/uL   Hemoglobin 7.5 (L) 12.0 - 15.0 g/dL   HCT 25.3 (L) 36.0 - 46.0 %   MCV 58.7 (L) 78.0 - 100.0 fL   MCH 17.4 (L) 26.0 - 34.0 pg   MCHC 29.6 (L) 30.0 - 36.0 g/dL   RDW 19.4 (H) 11.5 - 15.5 %   Platelets 254 150 - 400 K/uL  Wet prep, genital     Status: Abnormal   Collection Time: 11/22/16  4:16 PM  Result Value Ref Range   Yeast Wet Prep HPF POC NONE SEEN NONE SEEN   Trich, Wet Prep NONE SEEN NONE SEEN   Clue Cells Wet  Prep HPF POC PRESENT (A) NONE SEEN   WBC, Wet Prep HPF POC FEW (A) NONE SEEN   Sperm NONE SEEN     IMAGING US Ob Comp Less 14 Wks  Result Date: 11/22/2016 CLINICAL DATA:  Pregnant patient.  Patient is 6 weeks and 3 days pregnant based on her last menstrual period. Complaining of pelvic and back pain for 1 week. EXAM: OBSTETRIC <14 WK [place] AND TRANSVAGINAL OB US TECHNIQUE: Both transabdominal and transvaginal ultrasound examinations were performed for complete evaluation of the gestation as well as the maternal uterus, adnexal regions, and pelvic cul-de-sac. Transvaginal technique was performed to assess early pregnancy. COMPARISON:  None. FINDINGS: Intrauterine gestational sac: Single Yolk sac:  Yes Embryo:  Yes Cardiac Activity: Yes Heart Rate: 100  bpm MSD:  10 mm consistent with a 5 week 5 day gestational CRL: 2 mm 5 w 5 d [place] EDC: 07/20/2017. Crown-rump length is below the standardized size ranges for calculation of dates. Subchorionic hemorrhage:  None visualized. Maternal uterus/adnexae: Unremarkable IMPRESSION: 1. Single live intrauterine pregnancy. Measured gestational age, allowing for the small size of the embryo, is 5 weeks and 5 days. 2. No emergent pregnancy or maternal complication. Adnexa and ovaries are unremarkable. Pregnant patient. Patient is 6 weeks and 3 days pregnant based on her last menstrual period Please select correct "[place] Early OB" or "[place] ED OB Limited" template depending on combination of exams performed/being read (e.g. Both, Doppler, etc.) Electronically Signed   By: Lajean Manes M.D.   On: 11/22/2016 16:10   US Ob Transvaginal  Result Date: 11/22/2016 CLINICAL DATA:  Pregnant patient. Patient is 6 weeks and 3 days pregnant based on her last menstrual period. Complaining of pelvic and back pain for 1 week. EXAM: OBSTETRIC <14 WK [place] AND TRANSVAGINAL OB US TECHNIQUE: Both transabdominal and transvaginal ultrasound examinations were performed for complete evaluation of the  gestation as well as the maternal uterus, adnexal regions, and pelvic cul-de-sac. Transvaginal technique was performed to assess early pregnancy. COMPARISON:  None. FINDINGS: Intrauterine gestational sac: Single Yolk sac:  Yes Embryo:  Yes Cardiac Activity: Yes Heart Rate: 100  bpm MSD:  10 mm consistent with a 5 week 5 day gestational CRL: 2 mm 5 w 5 d [place] EDC: 07/20/2017. Crown-rump length is below the standardized size ranges for calculation of dates. Subchorionic hemorrhage:  None visualized. Maternal uterus/adnexae: Unremarkable IMPRESSION: 1. Single live intrauterine pregnancy. Measured gestational age, allowing for the small size of the embryo, is 5 weeks and 5 days. 2. No emergent pregnancy or maternal complication. Adnexa and ovaries are unremarkable. Pregnant patient. Patient is 6 weeks and 3 days pregnant based on her last menstrual period Please select correct "[place] Early OB" or "[place] ED OB Limited" template depending on combination of exams performed/being read (e.g. Both, Doppler, etc.) Electronically Signed   By: Lajean Manes M.D.   On: 11/22/2016 16:10    MAU COURSE CBC, Quant, ABO/Rh, ultrasound, wet prep and GC/chlamydia culture, UA  MDM - Abd Pain in early pregnancy with normal intrauterine pregnancy and hemodynamically stable. Possibly due to BV.   - Chronic anemia. Recommend further discussion and workup at [facility].    ASSESSMENT 1. Abdominal pain during intrauterine pregnancy   2. Chronic anemia   3. BV (bacterial vaginosis)     PLAN Discharge home in stable condition. First trimester precautions Start FeSO4 TID, Rx Flagyl, Concept OB.  Follow-up Information    [facility] Follow up.   Specialty:  Obstetrics and Gynecology Why:  Call to start prenatal care Contact information: [address], [place]       [place] Follow up.   Why:  as needed in  emergencies Contact information: [address] [id number] [place] [phone]           Medication List    STOP taking these medications   ibuprofen 800 MG tablet Commonly known as:  ADVIL,MOTRIN     TAKE these medications   acetaminophen 500 MG tablet Commonly known as:  TYLENOL Take 2 tablets (1,000 mg total) by mouth every 6 (six) hours as needed.   CONCEPT OB 130-92.4-1 MG Caps Take 1 tablet by mouth daily.   metroNIDAZOLE 500 MG tablet Commonly known as:  FLAGYL Take 1 tablet (500 mg total) by mouth 2 (two) times daily.        [place], [place] 11/22/2016  5:06 PM  4

## 2016-11-22 NOTE — Discharge Instructions (Signed)
Abdominal Pain During Pregnancy Abdominal pain is common in pregnancy. Most of the time, it does not cause harm. There are many causes of abdominal pain. Some causes are more serious than others and sometimes the cause is not known. Abdominal pain can be a sign that something is very wrong with the pregnancy or the pain may have nothing to do with the pregnancy. Always tell your health care provider if you have any abdominal pain. Follow these instructions at home:  Do not have sex or put anything in your vagina until your symptoms go away completely.  Watch your abdominal pain for any changes.  Get plenty of rest until your pain improves.  Drink enough fluid to keep your urine clear or pale yellow.  Take over-the-counter or prescription medicines only as told by your health care provider.  Keep all follow-up visits as told by your health care provider. This is important. Contact a health care provider if:  You have a fever.  Your pain gets worse or you have cramping.  Your pain continues after resting. Get help right away if:  You are bleeding, leaking fluid, or passing tissue from the vagina.  You have vomiting or diarrhea that does not go away.  You have painful or bloody urination.  You notice a decrease in your baby's movements.  You feel very weak or faint.  You have shortness of breath.  You develop a severe headache with abdominal pain.  You have abnormal vaginal discharge with abdominal pain. This information is not intended to replace advice given to you by your health care provider. Make sure you discuss any questions you have with your health care provider. Document Released: 12/07/2005 Document Revised: 09/17/2016 Document Reviewed: 07/06/2013 Elsevier Interactive Patient Education  2017 Elsevier Inc.   Bacterial Vaginosis Bacterial vaginosis is a vaginal infection that occurs when the normal balance of bacteria in the vagina is disrupted. It results from an  overgrowth of certain bacteria. This is the most common vaginal infection among women ages 19-44. Because bacterial vaginosis increases your risk for STIs (sexually transmitted infections), getting treated can help reduce your risk for chlamydia, gonorrhea, herpes, and HIV (human immunodeficiency virus). Treatment is also important for preventing complications in pregnant women, because this condition can cause an early (premature) delivery. What are the causes? This condition is caused by an increase in harmful bacteria that are normally present in small amounts in the vagina. However, the reason that the condition develops is not fully understood. What increases the risk? The following factors may make you more likely to develop this condition:  Having a new sexual partner or multiple sexual partners.  Having unprotected sex.  Douching.  Having an intrauterine device (IUD).  Smoking.  Drug and alcohol abuse.  Taking certain antibiotic medicines.  Being pregnant. You cannot get bacterial vaginosis from toilet seats, bedding, swimming pools, or contact with objects around you. What are the signs or symptoms? Symptoms of this condition include:  Grey or white vaginal discharge. The discharge can also be watery or foamy.  A fish-like odor with discharge, especially after sexual intercourse or during menstruation.  Itching in and around the vagina.  Burning or pain with urination. Some women with bacterial vaginosis have no signs or symptoms. How is this diagnosed? This condition is diagnosed based on:  Your medical history.  A physical exam of the vagina.  Testing a sample of vaginal fluid under a microscope to look for a large amount of bad bacteria or  abnormal cells. Your health care provider may use a cotton swab or a small wooden spatula to collect the sample. How is this treated? This condition is treated with antibiotics. These may be given as a pill, a vaginal cream,  or a medicine that is put into the vagina (suppository). If the condition comes back after treatment, a second round of antibiotics may be needed. Follow these instructions at home: Medicines  Take over-the-counter and prescription medicines only as told by your health care provider.  Take or use your antibiotic as told by your health care provider. Do not stop taking or using the antibiotic even if you start to feel better. General instructions  If you have a female sexual partner, tell her that you have a vaginal infection. She should see her health care provider and be treated if she has symptoms. If you have a female sexual partner, he does not need treatment.  During treatment:  Avoid sexual activity until you finish treatment.  Do not douche.  Avoid alcohol as directed by your health care provider.  Avoid breastfeeding as directed by your health care provider.  Drink enough water and fluids to keep your urine clear or pale yellow.  Keep the area around your vagina and rectum clean.  Wash the area daily with warm water.  Wipe yourself from front to back after using the toilet.  Keep all follow-up visits as told by your health care provider. This is important. How is this prevented?  Do not douche.  Wash the outside of your vagina with warm water only.  Use protection when having sex. This includes latex condoms and dental dams.  Limit how many sexual partners you have. To help prevent bacterial vaginosis, it is best to have sex with just one partner (monogamous).  Make sure you and your sexual partner are tested for STIs.  Wear cotton or cotton-lined underwear.  Avoid wearing tight pants and pantyhose, especially during summer.  Limit the amount of alcohol that you drink.  Do not use any products that contain nicotine or tobacco, such as cigarettes and e-cigarettes. If you need help quitting, ask your health care provider.  Do not use illegal drugs. Where to find  more information:  Centers for Disease Control and Prevention: AppraiserFraud.fi  American Sexual Health Association (ASHA): www.ashastd.org  U.S. Department of Health and Financial controller, Office on Women's Health: DustingSprays.pl or SecuritiesCard.it Contact a health care provider if:  Your symptoms do not improve, even after treatment.  You have more discharge or pain when urinating.  You have a fever.  You have pain in your abdomen.  You have pain during sex.  You have vaginal bleeding between periods. Summary  Bacterial vaginosis is a vaginal infection that occurs when the normal balance of bacteria in the vagina is disrupted.  Because bacterial vaginosis increases your risk for STIs (sexually transmitted infections), getting treated can help reduce your risk for chlamydia, gonorrhea, herpes, and HIV (human immunodeficiency virus). Treatment is also important for preventing complications in pregnant women, because the condition can cause an early (premature) delivery.  This condition is treated with antibiotic medicines. These may be given as a pill, a vaginal cream, or a medicine that is put into the vagina (suppository). This information is not intended to replace advice given to you by your health care provider. Make sure you discuss any questions you have with your health care provider. Document Released: 12/07/2005 Document Revised: 08/22/2016 Document Reviewed: 08/22/2016 Elsevier Interactive Patient Education  2017 [place].

## 2016-11-22 NOTE — MAU Note (Signed)
Patient had 2 positive UPTs at home, started with lower abdominal pain and back pain x 5 days, vomited 1 time last night, also c/o nasal congestion.

## 2016-11-23 LAB — GC/CHLAMYDIA PROBE AMP (~~LOC~~) NOT AT ARMC
CHLAMYDIA, DNA PROBE: NEGATIVE
Neisseria Gonorrhea: NEGATIVE

## 2016-11-24 LAB — HIV ANTIBODY (ROUTINE TESTING W REFLEX): HIV SCREEN 4TH GENERATION: NONREACTIVE

## 2016-11-25 ENCOUNTER — Encounter (HOSPITAL_COMMUNITY): Payer: Self-pay | Admitting: Advanced Practice Midwife

## 2016-11-25 ENCOUNTER — Other Ambulatory Visit: Payer: Self-pay | Admitting: Advanced Practice Midwife

## 2016-11-25 MED ORDER — FERROUS SULFATE 325 (65 FE) MG PO TABS: 325.0000 mg | ORAL_TABLET | Freq: Two times a day (BID) | ORAL | 8 refills | Status: DC

## 2016-11-27 ENCOUNTER — Telehealth: Payer: Self-pay

## 2016-11-27 NOTE — Telephone Encounter (Signed)
Patient called, advised of rx sent by provider

## 2016-11-27 NOTE — Telephone Encounter (Signed)
Attempted to contact pt, no answer, left vm to call.

## 2016-12-01 NOTE — Progress Notes (Signed)
Patient notified by [place]

## 2016-12-10 ENCOUNTER — Inpatient Hospital Stay (HOSPITAL_COMMUNITY)
Admission: AD | Admit: 2016-12-10 | Discharge: 2016-12-10 | Disposition: A | Discharge status: Home / Self Care | Payer: Medicaid Other | Source: Ambulatory Visit | Attending: Obstetrics & Gynecology | Admitting: Obstetrics & Gynecology

## 2016-12-10 ENCOUNTER — Encounter (HOSPITAL_COMMUNITY): Payer: Self-pay | Admitting: *Deleted

## 2016-12-10 DIAGNOSIS — Z885 Allergy status to narcotic agent status: Secondary | ICD-10-CM | POA: Insufficient documentation

## 2016-12-10 DIAGNOSIS — R109 Unspecified abdominal pain: Secondary | ICD-10-CM | POA: Insufficient documentation

## 2016-12-10 DIAGNOSIS — Z79899 Other long term (current) drug therapy: Secondary | ICD-10-CM | POA: Diagnosis not present

## 2016-12-10 DIAGNOSIS — Z9889 Other specified postprocedural states: Secondary | ICD-10-CM | POA: Diagnosis not present

## 2016-12-10 DIAGNOSIS — F17290 Nicotine dependence, other tobacco product, uncomplicated: Secondary | ICD-10-CM | POA: Insufficient documentation

## 2016-12-10 HISTORY — DX: Other specified postprocedural states: Z98.890

## 2016-12-10 MED ORDER — OXYCODONE-ACETAMINOPHEN 5-325 MG PO TABS: 1.0000 | ORAL_TABLET | ORAL | 0 refills | Status: DC | PRN

## 2016-12-10 MED ORDER — KETOROLAC TROMETHAMINE 30 MG/ML IJ SOLN
30.0000 mg | Freq: Once | INTRAMUSCULAR | Status: AC
  Administered 2016-12-10: 30 mg via INTRAMUSCULAR

## 2016-12-10 MED ORDER — KETOROLAC TROMETHAMINE 30 MG/ML IJ SOLN
30.0000 mg | Freq: Once | INTRAMUSCULAR | Status: DC
  Filled 2016-12-10: qty 1

## 2016-12-10 MED ORDER — OXYCODONE-ACETAMINOPHEN 5-325 MG PO TABS: 1.0000 | ORAL_TABLET | ORAL | Status: DC | PRN

## 2016-12-10 NOTE — MAU Note (Signed)
Pt reports she had an abortion on Friday. Stated she has been having lots of pain/cramping and bleeding ever since. Using 800mg  Ibuprofen without relief.

## 2016-12-10 NOTE — Discharge Instructions (Signed)
-  return to MAU if saturating more than 1 pad in an hour or if develop fever, chills or abdominal pain is not relieved with medication -resume taking iron supplements -follow up with [facility] for aftercare

## 2016-12-10 NOTE — MAU Provider Note (Signed)
History    Beth Gibson is a 30 year old [id number] status post surgical TAB on 12/04/2016 at [facility]. Patient denies fever and chills; states she is here because [facility] told her to come here for pain management as they ([facility]) could only prescribe ibuprofen.  CSN: [id number]  Arrival date and time: 12/10/16 [id number]   First Provider Initiated Contact with Patient 12/10/16 [phone]      Chief Complaint  Patient presents with  . Abdominal Pain   Abdominal Pain  This is a new problem. The current episode started yesterday. The onset quality is sudden. The problem occurs constantly. The problem has been unchanged. The pain is located in the suprapubic region. The pain is at a severity of 8/10. The pain is severe. The quality of the pain is cramping. The abdominal pain does not radiate. Pertinent negatives include no anorexia, arthralgias, belching, constipation, diarrhea, fever, flatus, frequency, headaches, hematochezia, hematuria, melena, myalgias, nausea, vomiting or weight loss. Treatments tried: ibuprofen 800. The treatment provided no relief.   OB History    Gravida Para Term Preterm AB Living   4 2 0 2 1 2    SAB TAB Ectopic Multiple Live Births   1 0 0 0 2      Past Medical History:  Diagnosis Date  . Abscess of axilla, right   . BV (bacterial vaginosis)   . Chlamydia   . Cholelithiases   . Headache(784.0)   . Preterm labor   . Trichomonas   . UTI (lower urinary tract infection)   . Yeast vaginitis     Past Surgical History:  Procedure Laterality Date  . CESAREAN SECTION    . CESAREAN SECTION  04/18/2012   Procedure: CESAREAN SECTION;  Surgeon: Guss Bunde, MD;  Location: [facility];  Service: Gynecology;  Laterality: N/A;  repeat  . cyst removed from L buttock    . CYSTECTOMY  2009   left buttock  . INCISION AND DRAINAGE ABSCESS Right 11/01/2014   Procedure: INCISION AND DRAINAGE RIGHT AXILLARY ABSCESS;  Surgeon: Leighton Ruff, MD;  Location: [facility];   Service: General;  Laterality: Right;  . IRRIGATION AND DEBRIDEMENT ABSCESS Right 08/09/2015   Procedure: IRRIGATION AND DEBRIDEMENT AXILLARY ABSCESS;  Surgeon: Alphonsa Overall, MD;  Location: [facility];  Service: General;  Laterality: Right;  . WISDOM TOOTH EXTRACTION    . WISDOM TOOTH EXTRACTION  2009    Family History  Problem Relation Age of Onset  . Hypertension Mother   . Diabetes Mellitus I Father     Social History  Substance Use Topics  . Smoking status: Current Some Day Smoker    Types: Cigars  . Smokeless tobacco: Never Used  . Alcohol use No    Allergies:  Allergies  Allergen Reactions  . Vicodin [Hydrocodone-Acetaminophen] Itching    Prescriptions Prior to Admission  Medication Sig Dispense Refill Last Dose  . ferrous sulfate (FERROUSUL) 325 (65 FE) MG tablet Take 1 tablet (325 mg total) by mouth 2 (two) times daily with a meal. 60 tablet 8 12/09/2016 at Unknown time  . acetaminophen (TYLENOL) 500 MG tablet Take 2 tablets (1,000 mg total) by mouth every 6 (six) hours as needed. (Patient taking differently: Take 1,000 mg by mouth every 6 (six) hours as needed for mild pain. ) 30 tablet 0 prn    Review of Systems  Constitutional: Negative.  Negative for fever and weight loss.  HENT: Negative.   Eyes: Negative.   Respiratory: Negative.  Cardiovascular: Negative.   Gastrointestinal: Positive for abdominal pain. Negative for anorexia, constipation, diarrhea, flatus, hematochezia, melena, nausea and vomiting.  Genitourinary: Negative for frequency and hematuria.  Musculoskeletal: Negative.  Negative for arthralgias and myalgias.  Skin: Negative.   Neurological: Negative.  Negative for headaches.  Endo/Heme/Allergies: Negative.   Psychiatric/Behavioral: Negative.    Physical Exam   Blood pressure 138/72, pulse 83, temperature 98.7 F (37.1 C), temperature source Oral, resp. rate 18, height 5\' 8"  (1.727 m), weight 211 lb 1.9 oz (95.8 kg), last menstrual period  10/08/2016.  Physical Exam  Constitutional: She is oriented to person, place, and time. She appears well-developed.  HENT:  Head: Normocephalic.  Neck: Normal range of motion.  Cardiovascular: Normal rate.   Respiratory: Effort normal. No respiratory distress. She has no wheezes.  GI: Soft. She exhibits no distension and no mass. There is tenderness. There is no rebound and no guarding.  Genitourinary: Vagina normal and uterus normal.  Genitourinary Comments: External labia normal, vaginal walls normal with no lesions. Cervix is closed with no lesions, some suprapubic tenderness on exam. Small amount of blood in the vaginal fornix, no odor. No CMT.   Musculoskeletal: Normal range of motion.  Neurological: She is alert and oriented to person, place, and time.  Skin: Skin is warm and dry.  Psychiatric: She has a normal mood and affect.    MAU Course  Procedures  MDM  -pelvic exam -toradol for pain Assessment and Plan  1. G4 P0222 status post elective abortion 2. Abdominal pain  Patient feels better after IM toradol. Patient to be discharged home with RX for percocet and directions to return if pain or bleeding worsen (saturating more than 1 pad in an hour), or if she develops fever or chills. Patient will follow up with [facility] for IUD.   Mervyn Skeeters Karlin Binion CNM 12/10/2016, 10:33 AM

## 2016-12-11 ENCOUNTER — Ambulatory Visit: Payer: Medicaid Other | Admitting: Obstetrics and Gynecology

## 2016-12-12 ENCOUNTER — Encounter: Payer: Self-pay | Admitting: Obstetrics and Gynecology

## 2016-12-12 NOTE — Progress Notes (Signed)
Patient did not keep GYN appointment for 12/12/2016.  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

## 2017-02-04 ENCOUNTER — Inpatient Hospital Stay (HOSPITAL_COMMUNITY)
Admission: AD | Admit: 2017-02-04 | Discharge: 2017-02-04 | Disposition: A | Discharge status: Home / Self Care | Payer: Medicaid Other | Source: Ambulatory Visit | Attending: Obstetrics & Gynecology | Admitting: Obstetrics & Gynecology

## 2017-02-04 ENCOUNTER — Encounter: Payer: Self-pay | Admitting: Advanced Practice Midwife

## 2017-02-04 DIAGNOSIS — N938 Other specified abnormal uterine and vaginal bleeding: Secondary | ICD-10-CM | POA: Diagnosis not present

## 2017-02-04 DIAGNOSIS — R109 Unspecified abdominal pain: Secondary | ICD-10-CM | POA: Insufficient documentation

## 2017-02-04 DIAGNOSIS — Z833 Family history of diabetes mellitus: Secondary | ICD-10-CM | POA: Diagnosis not present

## 2017-02-04 DIAGNOSIS — Z87440 Personal history of urinary (tract) infections: Secondary | ICD-10-CM | POA: Insufficient documentation

## 2017-02-04 DIAGNOSIS — D649 Anemia, unspecified: Secondary | ICD-10-CM | POA: Insufficient documentation

## 2017-02-04 DIAGNOSIS — Z8249 Family history of ischemic heart disease and other diseases of the circulatory system: Secondary | ICD-10-CM | POA: Diagnosis not present

## 2017-02-04 DIAGNOSIS — F17290 Nicotine dependence, other tobacco product, uncomplicated: Secondary | ICD-10-CM | POA: Insufficient documentation

## 2017-02-04 DIAGNOSIS — D500 Iron deficiency anemia secondary to blood loss (chronic): Secondary | ICD-10-CM

## 2017-02-04 DIAGNOSIS — Z9889 Other specified postprocedural states: Secondary | ICD-10-CM | POA: Diagnosis not present

## 2017-02-04 DIAGNOSIS — Z888 Allergy status to other drugs, medicaments and biological substances status: Secondary | ICD-10-CM | POA: Diagnosis not present

## 2017-02-04 DIAGNOSIS — Z79899 Other long term (current) drug therapy: Secondary | ICD-10-CM | POA: Insufficient documentation

## 2017-02-04 LAB — URINALYSIS, ROUTINE W REFLEX MICROSCOPIC

## 2017-02-04 LAB — CBC
HCT: 24.3 % — ABNORMAL LOW (ref 36.0–46.0)
HEMOGLOBIN: 7.3 g/dL — AB (ref 12.0–15.0)
MCH: 17.5 pg — ABNORMAL LOW (ref 26.0–34.0)
MCHC: 30 g/dL (ref 30.0–36.0)
MCV: 58.3 fL — ABNORMAL LOW (ref 78.0–100.0)
PLATELETS: 170 10*3/uL (ref 150–400)
RBC: 4.17 MIL/uL (ref 3.87–5.11)
RDW: 20.5 % — ABNORMAL HIGH (ref 11.5–15.5)
WBC: 4.1 10*3/uL (ref 4.0–10.5)

## 2017-02-04 LAB — URINALYSIS, MICROSCOPIC (REFLEX)

## 2017-02-04 LAB — POCT PREGNANCY, URINE: PREG TEST UR: NEGATIVE

## 2017-02-04 MED ORDER — OXYCODONE-ACETAMINOPHEN 5-325 MG PO TABS: 1.0000 | ORAL_TABLET | Freq: Four times a day (QID) | ORAL | 0 refills | Status: DC | PRN

## 2017-02-04 MED ORDER — KETOROLAC TROMETHAMINE 60 MG/2ML IM SOLN
60.0000 mg | Freq: Once | INTRAMUSCULAR | Status: AC
  Administered 2017-02-04: 60 mg via INTRAMUSCULAR
  Filled 2017-02-04: qty 2

## 2017-02-04 NOTE — MAU Provider Note (Signed)
Chief Complaint:  Vaginal Bleeding   First Provider Initiated Contact with Patient 02/04/17 0913       HPI: Beth Gibson is a 31 y.o. [id number] who presents to maternity admissions reporting heavy menstrual bleeding. Is worried she has pregnancy tissue from abortion. . She reports vaginal bleeding, but no vaginal itching/burning, urinary symptoms, h/a, dizziness, n/v, or fever/chills.    Vaginal Bleeding  The patient's primary symptoms include vaginal bleeding. The patient's pertinent negatives include no genital itching, genital lesions, genital odor, genital rash or missed menses. This is a new problem. The current episode started yesterday. The problem occurs constantly. The problem has been unchanged. The pain is mild. The problem affects both sides. She is not pregnant. Associated symptoms include abdominal pain. Pertinent negatives include no back pain, chills, constipation, diarrhea, fever, headaches, nausea or vomiting. The vaginal discharge was bloody. The vaginal bleeding is heavier than menses. She has been passing clots. She has not been passing tissue. Nothing aggravates the symptoms. She has tried nothing for the symptoms. She uses nothing for contraception.   RN Note: Pt had an abortion last month. Started her period 2 days ago. Bleeding ha been heaving changing pads q 20-30 min with very bad cramping.  Past Medical History: Past Medical History:  Diagnosis Date  . Abscess of axilla, right   . BV (bacterial vaginosis)   . Chlamydia   . Cholelithiases   . Headache(784.0)   . Preterm labor   . Trichomonas   . UTI (lower urinary tract infection)   . Yeast vaginitis     Past obstetric history: OB History  Gravida Para Term Preterm AB Living  4 2 0 2 2 2   SAB TAB Ectopic Multiple Live Births  1 0 0 0 2    # Outcome Date GA Lbr Len/2nd Weight Sex Delivery Anes PTL Lv  4 Preterm 04/18/12 [redacted]w[redacted]d  5 lb 6.6 oz (2.455 kg) F CS-LTranv Spinal  LIV  3 Preterm 11/30/10 [redacted]w[redacted]d  3 lb  1 oz (1.389 kg) M CS-Classical EPI  LIV  2 AB           1 SAB               Past Surgical History: Past Surgical History:  Procedure Laterality Date  . CESAREAN SECTION    . CESAREAN SECTION  04/18/2012   Procedure: CESAREAN SECTION;  Surgeon: Guss Bunde, MD;  Location: [facility];  Service: Gynecology;  Laterality: N/A;  repeat  . cyst removed from L buttock    . CYSTECTOMY  2009   left buttock  . INCISION AND DRAINAGE ABSCESS Right 11/01/2014   Procedure: INCISION AND DRAINAGE RIGHT AXILLARY ABSCESS;  Surgeon: Leighton Ruff, MD;  Location: [facility];  Service: General;  Laterality: Right;  . IRRIGATION AND DEBRIDEMENT ABSCESS Right 08/09/2015   Procedure: IRRIGATION AND DEBRIDEMENT AXILLARY ABSCESS;  Surgeon: Alphonsa Overall, MD;  Location: [facility];  Service: General;  Laterality: Right;  . WISDOM TOOTH EXTRACTION    . WISDOM TOOTH EXTRACTION  2009    Family History: Family History  Problem Relation Age of Onset  . Hypertension Mother   . Diabetes Mellitus I Father     Social History: Social History  Substance Use Topics  . Smoking status: Current Some Day Smoker    Types: Cigars  . Smokeless tobacco: Never Used  . Alcohol use No    Allergies:  Allergies  Allergen Reactions  . Vicodin [Hydrocodone-Acetaminophen] Itching  Meds:  Prescriptions Prior to Admission  Medication Sig Dispense Refill Last Dose  . acetaminophen (TYLENOL) 500 MG tablet Take 2 tablets (1,000 mg total) by mouth every 6 (six) hours as needed. (Patient taking differently: Take 1,000 mg by mouth every 6 (six) hours as needed for mild pain. ) 30 tablet 0 02/03/2017 at Unknown time  . ferrous sulfate (FERROUSUL) 325 (65 FE) MG tablet Take 1 tablet (325 mg total) by mouth 2 (two) times daily with a meal. 60 tablet 8 Past Week at Unknown time  . oxyCODONE-acetaminophen (PERCOCET/ROXICET) 5-325 MG tablet Take 1 tablet by mouth every 4 (four) hours as needed for severe pain. (Patient not taking: Reported on  02/04/2017) 10 tablet 0 Completed Course at Unknown time    I have reviewed patient's Past Medical Hx, Surgical Hx, Family Hx, Social Hx, medications and allergies.  ROS:  Review of Systems  Constitutional: Negative for chills and fever.  Gastrointestinal: Positive for abdominal pain. Negative for constipation, diarrhea, nausea and vomiting.  Genitourinary: Positive for vaginal bleeding. Negative for missed menses.  Musculoskeletal: Negative for back pain.  Neurological: Negative for headaches.   Other systems negative     Physical Exam  Patient Vitals for the past 24 hrs:  BP Temp Temp src Pulse Resp Height Weight  02/04/17 0839 131/91 98.2 F (36.8 C) Oral 89 18 5\' 8"  (1.727 m) 212 lb (96.2 kg)   Constitutional: Well-developed, well-nourished female in no acute distress.  Cardiovascular: normal rate and rhythm, no ectopy audible, S1 & S2 heard, no murmur Respiratory: normal effort, no distress. Lungs CTAB with no wheezes or crackles GI: Abd soft, non-tender.  Nondistended.  No rebound, No guarding.  Bowel Sounds audible  MS: Extremities nontender, no edema, normal ROM Neurologic: Alert and oriented x 4.   Grossly nonfocal. GU: Neg CVAT. Skin:  Warm and Dry Psych:  Affect appropriate.  PELVIC EXAM: Cervix pink, visually closed, without lesion, small to moderate bloody discharge (pad only 1/3 soaked in 2 hours), vaginal walls and external genitalia normal Bimanual exam: Cervix firm, anterior, neg CMT, uterus nontender, nonenlarged, adnexa without tenderness, enlargement, or mass    Labs: Results for orders placed or performed during the hospital encounter of 02/04/17 (from the past 24 hour(s))  Pregnancy, urine POC     Status: None   Collection Time: 02/04/17  8:55 AM  Result Value Ref Range   Preg Test, Ur NEGATIVE NEGATIVE       Ref. Range 02/04/2017 09:55  WBC Latest Ref Range: 4.0 - 10.5 K/uL 4.1  RBC Latest Ref Range: 3.87 - 5.11 MIL/uL 4.17  Hemoglobin Latest Ref  Range: 12.0 - 15.0 g/dL 7.3 (L)  HCT Latest Ref Range: 36.0 - 46.0 % 24.3 (L)  MCV Latest Ref Range: 78.0 - 100.0 fL 58.3 (L)  MCH Latest Ref Range: 26.0 - 34.0 pg 17.5 (L)  MCHC Latest Ref Range: 30.0 - 36.0 g/dL 30.0  RDW Latest Ref Range: 11.5 - 15.5 % 20.5 (H)  Platelets Latest Ref Range: 150 - 400 K/uL 170   Hemoglobin 2 months ago =   7.5    Imaging:  No results found.  MAU Course/MDM: I have ordered labs as follows:  CBC which showed stable Hemoglobin Imaging ordered: none Results reviewed.  Treatments in MAU included Reassurance that bleeding has subsided.  No active hemorrhage Stable Hgb.  Plan followup in clinic with Dr Ilda Basset.  Continiue Iron. .   Pt stable at time of discharge.  Assessment: Dysfunctional  Uterine Bleeding Anemia  Plan: Discharge home Recommend followup in clinic. Continue Iron supps Bleeding precautions Encouraged to return here or to other Urgent Care/ED if she develops worsening of symptoms, increase in pain, fever, or other concerning symptoms.   Hansel Feinstein CNM, MSN Certified Nurse-Midwife 02/04/2017 9:38 AM

## 2017-02-04 NOTE — MAU Note (Signed)
HPI  Ms. Atkison is a 31 year old [id number] who presents to the MAU for heavy vaginal bleeding and cramps. The patient states that she has gone through a 24 pack of pads in one day and has bleed through her clothes multiple times in the past day. She has tried applying heat, ibuprofen 800 mg, and extra strength tylenol, which have no helped. Her pain radiates to her back. She admits to weakness, headache, nausea and lightheadedness. She rates her pain a 10/10.

## 2017-02-04 NOTE — Discharge Instructions (Signed)
Iron Deficiency Anemia, Adult Iron-deficiency anemia is when you have a low amount of red blood cells or hemoglobin. This happens because you have too little iron in your body. Hemoglobin carries oxygen to parts of the body. Anemia can cause your body to not get enough oxygen. It may or may not cause symptoms. Follow these instructions at home: Medicines  Take over-the-counter and prescription medicines only as told by your doctor. This includes iron pills (supplements) and vitamins.  If you cannot handle taking iron pills by mouth, ask your doctor about getting iron through:  A vein (intravenously).  A shot (injection) into a muscle.  Take iron pills when your stomach is empty. If you cannot handle this, take them with food.  Do not drink milk or take antacids at the same time as your iron pills.  To prevent trouble pooping (constipation), eat fiber or take medicine (stool softener) as told by your doctor. Eating and drinking  Talk with your doctor before changing the foods you eat. He or she may tell you to eat foods that have a lot of iron, such as:  Liver.  Lowfat (lean) beef.  Breads and cereals that have iron added to them (fortified breads and cereals).  Eggs.  Dried fruit.  Dark green, leafy vegetables.  Drink enough fluid to keep your pee (urine) clear or pale yellow.  Eat fresh fruits and vegetables that are high in vitamin C. They help your body to use iron. Foods with a lot of vitamin C include:  Oranges.  Peppers.  Tomatoes.  Mangoes. General instructions  Return to your normal activities as told by your doctor. Ask your doctor what activities are safe for you.  Keep yourself clean, and keep things clean around you (your surroundings). Anemia can make you get sick more easily.  Keep all follow-up visits as told by your doctor. This is important. Contact a doctor if:  You feel sick to your stomach (nauseous).  You throw up (vomit).  You feel  weak.  You are sweating for no clear reason.  You have trouble pooping, such as:  Pooping (having a bowel movement) less than 3 times a week.  Straining to poop.  Having poop that is hard, dry, or larger than normal.  Feeling full or bloated.  Pain in the lower belly.  Not feeling better after pooping. Get help right away if:  You pass out (faint). If this happens, do not drive yourself to the hospital. Call your local emergency services (911 in the [place]  You have chest pain.  You have shortness of breath that:  Is very bad.  Gets worse with physical activity.  You have a fast heartbeat.  You get light-headed when getting up from sitting or lying down. This information is not intended to replace advice given to you by your health care provider. Make sure you discuss any questions you have with your health care provider. Document Released: 01/09/2011 Document Revised: 08/26/2016 Document Reviewed: 08/26/2016 Elsevier Interactive Patient Education  2017 [employer] Inc[employer]   Menorrhagia Menorrhagia is when your menstrual periods are heavy or last longer than usual. Follow these instructions at home:  Only take medicine as told by your doctor.  Take any iron pills as told by your doctor. Heavy bleeding may cause low levels of iron in your body.  Do not take aspirin 1 week before or during your period. Aspirin can make the bleeding worse.  Lie down for a while if you change your tampon  or pad more than once in 2 hours. This may help lessen the bleeding.  Eat a healthy diet and foods with iron. These foods include leafy green vegetables, meat, liver, eggs, and whole grain breads and cereals.  Do not try to lose weight. Wait until the heavy bleeding has stopped and your iron level is normal. Contact a doctor if:  You soak through a pad or tampon every 1 or 2 hours, and this happens every time you have a period.  You need to use pads and tampons at the same time because  you are bleeding so much.  You need to change your pad or tampon during the night.  You have a period that lasts for more than 8 days.  You pass clots bigger than 1 inch (2.5 cm) wide.  You have irregular periods that happen more or less often than once a month.  You feel dizzy or pass out (faint).  You feel very weak or tired.  You feel short of breath or feel your heart is beating too fast when you exercise.  You feel sick to your stomach (nausea) and you throw up (vomit) while you are taking your medicine.  You have watery poop (diarrhea) while you are taking your medicine.  You have any problems that may be related to the medicine you are taking. Get help right away if:  You soak through 4 or more pads or tampons in 2 hours.  You have any bleeding while you are pregnant. This information is not intended to replace advice given to you by your health care provider. Make sure you discuss any questions you have with your health care provider. Document Released: 09/15/2008 Document Revised: 05/14/2016 Document Reviewed: 06/08/2013 Elsevier Interactive Patient Education  2017 [employer].

## 2017-02-04 NOTE — MAU Note (Signed)
Pt had an abortion last month. Started her period 2 days ago. Bleeding ha been heaving changing pads q 20-30 min with very bad cramping.

## 2017-02-10 ENCOUNTER — Inpatient Hospital Stay (HOSPITAL_COMMUNITY)
Admission: AD | Admit: 2017-02-10 | Discharge: 2017-02-10 | Disposition: A | Discharge status: Home / Self Care | Payer: Medicaid Other | Source: Ambulatory Visit | Attending: Obstetrics & Gynecology | Admitting: Obstetrics & Gynecology

## 2017-02-10 ENCOUNTER — Encounter (HOSPITAL_COMMUNITY): Payer: Self-pay

## 2017-02-10 DIAGNOSIS — N939 Abnormal uterine and vaginal bleeding, unspecified: Secondary | ICD-10-CM

## 2017-02-10 DIAGNOSIS — Z885 Allergy status to narcotic agent status: Secondary | ICD-10-CM | POA: Diagnosis not present

## 2017-02-10 DIAGNOSIS — N938 Other specified abnormal uterine and vaginal bleeding: Secondary | ICD-10-CM | POA: Diagnosis present

## 2017-02-10 DIAGNOSIS — F17290 Nicotine dependence, other tobacco product, uncomplicated: Secondary | ICD-10-CM | POA: Diagnosis not present

## 2017-02-10 LAB — CBC
HEMATOCRIT: 26 % — AB (ref 36.0–46.0)
Hemoglobin: 7.8 g/dL — ABNORMAL LOW (ref 12.0–15.0)
MCH: 17.4 pg — AB (ref 26.0–34.0)
MCHC: 30 g/dL (ref 30.0–36.0)
MCV: 58 fL — AB (ref 78.0–100.0)
Platelets: 209 10*3/uL (ref 150–400)
RBC: 4.48 MIL/uL (ref 3.87–5.11)
RDW: 20.7 % — AB (ref 11.5–15.5)
WBC: 4.5 10*3/uL (ref 4.0–10.5)

## 2017-02-10 LAB — URINALYSIS, ROUTINE W REFLEX MICROSCOPIC
Bilirubin Urine: NEGATIVE
GLUCOSE, UA: NEGATIVE mg/dL
Ketones, ur: NEGATIVE mg/dL
NITRITE: NEGATIVE
PH: 7 (ref 5.0–8.0)
Protein, ur: NEGATIVE mg/dL
RBC / HPF: NONE SEEN RBC/hpf (ref 0–5)
SPECIFIC GRAVITY, URINE: 1.012 (ref 1.005–1.030)

## 2017-02-10 LAB — POCT PREGNANCY, URINE: Preg Test, Ur: NEGATIVE

## 2017-02-10 MED ORDER — KETOROLAC TROMETHAMINE 60 MG/2ML IM SOLN
60.0000 mg | Freq: Once | INTRAMUSCULAR | Status: AC
  Administered 2017-02-10: 60 mg via INTRAMUSCULAR
  Filled 2017-02-10: qty 2

## 2017-02-10 MED ORDER — FERROUS SULFATE 325 (65 FE) MG PO TABS: 325.0000 mg | ORAL_TABLET | Freq: Two times a day (BID) | ORAL | 8 refills | Status: DC

## 2017-02-10 MED ORDER — OXYCODONE-ACETAMINOPHEN 5-325 MG PO TABS: 1.0000 | ORAL_TABLET | Freq: Four times a day (QID) | ORAL | 0 refills | Status: DC | PRN

## 2017-02-10 MED ORDER — IBUPROFEN 600 MG PO TABS: 600.0000 mg | ORAL_TABLET | Freq: Four times a day (QID) | ORAL | 0 refills | Status: DC | PRN

## 2017-02-10 NOTE — Discharge Instructions (Signed)
Menorrhagia Menorrhagia is when your menstrual periods are heavy or last longer than usual. Follow these instructions at home:  Only take medicine as told by your doctor.  Take any iron pills as told by your doctor. Heavy bleeding may cause low levels of iron in your body.  Do not take aspirin 1 week before or during your period. Aspirin can make the bleeding worse.  Lie down for a while if you change your tampon or pad more than once in 2 hours. This may help lessen the bleeding.  Eat a healthy diet and foods with iron. These foods include leafy green vegetables, meat, liver, eggs, and whole grain breads and cereals.  Do not try to lose weight. Wait until the heavy bleeding has stopped and your iron level is normal. Contact a doctor if:  You soak through a pad or tampon every 1 or 2 hours, and this happens every time you have a period.  You need to use pads and tampons at the same time because you are bleeding so much.  You need to change your pad or tampon during the night.  You have a period that lasts for more than 8 days.  You pass clots bigger than 1 inch (2.5 cm) wide.  You have irregular periods that happen more or less often than once a month.  You feel dizzy or pass out (faint).  You feel very weak or tired.  You feel short of breath or feel your heart is beating too fast when you exercise.  You feel sick to your stomach (nausea) and you throw up (vomit) while you are taking your medicine.  You have watery poop (diarrhea) while you are taking your medicine.  You have any problems that may be related to the medicine you are taking. Get help right away if:  You soak through 4 or more pads or tampons in 2 hours.  You have any bleeding while you are pregnant. This information is not intended to replace advice given to you by your health care provider. Make sure you discuss any questions you have with your health care provider. Document Released: 09/15/2008 Document  Revised: 05/14/2016 Document Reviewed: 06/08/2013 Elsevier Interactive Patient Education  2017 Elsevier Inc.  

## 2017-02-10 NOTE — MAU Provider Note (Signed)
History     CSN: [id number]  Arrival date and time: 02/10/17 [id number]   First Provider Initiated Contact with Patient 02/10/17 [phone]      Chief Complaint  Patient presents with  . Vaginal Bleeding  . Abdominal Pain   HPI   Ms.Beth Gibson is a 31 y.o. non pregnant female here in MAU with complaints of vaginal bleeding. She was seen last week in MAU for the same complaint. She was sent home with [facility] pills in which she doubled twice per day for 3 days and the bleeding stopped. It shortly resumed. The patient feels tired and weak. She denies dizziness. She is still taking the BC pills once per day and Iron once per day. She did not follow up anywhere following her last MAU visit. She has never been fully worked up for abnormal bleeding.  She does not have a GYN.   OB History    Gravida Para Term Preterm AB Living   4 2 0 2 2 2    SAB TAB Ectopic Multiple Live Births   1 0 0 0 2      Past Medical History:  Diagnosis Date  . Abscess of axilla, right   . BV (bacterial vaginosis)   . Chlamydia   . Cholelithiases   . Headache(784.0)   . Preterm labor   . Trichomonas   . UTI (lower urinary tract infection)   . Yeast vaginitis     Past Surgical History:  Procedure Laterality Date  . CESAREAN SECTION    . CESAREAN SECTION  04/18/2012   Procedure: CESAREAN SECTION;  Surgeon: Guss Bunde, MD;  Location: [facility] [facility];  Service: Gynecology;  Laterality: N/A;  repeat  . cyst removed from L buttock    . CYSTECTOMY  2009   left buttock  . INCISION AND DRAINAGE ABSCESS Right 11/01/2014   Procedure: INCISION AND DRAINAGE RIGHT AXILLARY ABSCESS;  Surgeon: Leighton Ruff, MD;  Location: [facility];  Service: General;  Laterality: Right;  . IRRIGATION AND DEBRIDEMENT ABSCESS Right 08/09/2015   Procedure: IRRIGATION AND DEBRIDEMENT AXILLARY ABSCESS;  Surgeon: Alphonsa Overall, MD;  Location: [facility];  Service: General;  Laterality: Right;  . WISDOM TOOTH EXTRACTION    . WISDOM TOOTH EXTRACTION  2009     Family History  Problem Relation Age of Onset  . Hypertension Mother   . Diabetes Mellitus I Father     Social History  Substance Use Topics  . Smoking status: Current Some Day Smoker    Types: Cigars  . Smokeless tobacco: Never Used  . Alcohol use No    Allergies:  Allergies  Allergen Reactions  . Vicodin [Hydrocodone-Acetaminophen] Itching    Prescriptions Prior to Admission  Medication Sig Dispense Refill Last Dose  . acetaminophen (TYLENOL) 500 MG tablet Take 2 tablets (1,000 mg total) by mouth every 6 (six) hours as needed. (Patient taking differently: Take 1,000 mg by mouth every 6 (six) hours as needed for mild pain. ) 30 tablet 0 02/10/2017 at Unknown time  . ferrous sulfate (FERROUSUL) 325 (65 FE) MG tablet Take 1 tablet (325 mg total) by mouth 2 (two) times daily with a meal. 60 tablet 8 02/09/2017 at Unknown time  . oxyCODONE-acetaminophen (PERCOCET/ROXICET) 5-325 MG tablet Take 1-2 tablets by mouth every 6 (six) hours as needed. 15 tablet 0 02/05/2017   Results for orders placed or performed during the hospital encounter of 02/10/17 (from the past 48 hour(s))  Urinalysis, Routine w reflex microscopic  Status: Abnormal   Collection Time: 02/10/17  9:00 AM  Result Value Ref Range   Color, Urine YELLOW YELLOW   APPearance CLEAR CLEAR   Specific Gravity, Urine 1.012 1.005 - 1.030   pH 7.0 5.0 - 8.0   Glucose, UA NEGATIVE NEGATIVE mg/dL   Hgb urine dipstick MODERATE (A) NEGATIVE   Bilirubin Urine NEGATIVE NEGATIVE   Ketones, ur NEGATIVE NEGATIVE mg/dL   Protein, ur NEGATIVE NEGATIVE mg/dL   Nitrite NEGATIVE NEGATIVE   Leukocytes, UA TRACE (A) NEGATIVE   RBC / HPF NONE SEEN 0 - 5 RBC/hpf   WBC, UA 0-5 0 - 5 WBC/hpf   Bacteria, UA RARE (A) NONE SEEN   Squamous Epithelial / LPF 0-5 (A) NONE SEEN   Mucous PRESENT   Pregnancy, urine POC     Status: None   Collection Time: 02/10/17  9:23 AM  Result Value Ref Range   Preg Test, Ur NEGATIVE NEGATIVE    Comment:         THE SENSITIVITY OF THIS METHODOLOGY IS >24 mIU/mL   CBC     Status: Abnormal   Collection Time: 02/10/17  9:58 AM  Result Value Ref Range   WBC 4.5 4.0 - 10.5 K/uL   RBC 4.48 3.87 - 5.11 MIL/uL   Hemoglobin 7.8 (L) 12.0 - 15.0 g/dL   HCT 26.0 (L) 36.0 - 46.0 %   MCV 58.0 (L) 78.0 - 100.0 fL   MCH 17.4 (L) 26.0 - 34.0 pg   MCHC 30.0 30.0 - 36.0 g/dL   RDW 20.7 (H) 11.5 - 15.5 %   Platelets 209 150 - 400 K/uL   Review of Systems  Gastrointestinal: Positive for abdominal pain.  Genitourinary: Positive for pelvic pain and vaginal bleeding.  Neurological: Negative for dizziness and weakness.   Physical Exam   Height 5\' 8"  (1.727 m), weight 206 lb (93.4 kg), last menstrual period 01/31/2017, unknown if currently breastfeeding.  Physical Exam  Constitutional: She is oriented to person, place, and time. She appears well-developed and well-nourished. No distress.  HENT:  Head: Normocephalic.  Eyes: Pupils are equal, round, and reactive to light.  Respiratory: Effort normal.  GI: Soft. She exhibits no distension. There is no tenderness. There is no rebound.  Genitourinary:  Genitourinary Comments: Vagina - Small amount of dark red blood in the vagina.  Cervix - No contact bleeding, no active bleeding  Bimanual exam: Cervix closed Uterus non tender, normal size Chaperone present for exam.   Musculoskeletal: Normal range of motion.  Neurological: She is alert and oriented to person, place, and time.  Skin: Skin is warm. She is not diaphoretic.  Psychiatric: Her behavior is normal.    MAU Course  Procedures  None  MDM  Urine pregnancy test: negative  CBC.  Hgb 2/15: 7.3 Hgb 2/21: 7.8 Toradol for pain here in MAU.  Pain down from a 8/10 to 5/10. Patient feels well about going home.   Assessment and Plan   A:  1. Abnormal vaginal bleeding     P:  Discharge home in stable condition Pelvic US ordered outpatient.  Message sent to the [facility] for follow  up Discussed IUD placement, patient is agreeable. Will discuss at [facility] visit Rx: percocet for severe pain, ibuprofen.  Increase PO iron to BID Ok to continue [facility] pills for now. hgb stable and improving at this time.    Lezlie Lye, NP 02/10/2017 12:15 PM

## 2017-02-10 NOTE — MAU Note (Signed)
Having right sided pain and has been bleeding for 10 days. 8/10. Has been taking tylenol and ibuprofen for pain. No relief last night. No N/V/D.

## 2017-02-10 NOTE — ED Provider Notes (Signed)
History     CSN: [id number]  Arrival date and time: 02/10/17 [id number]   First Provider Initiated Contact with Patient 02/10/17 [phone]      Chief Complaint  Patient presents with  . Vaginal Bleeding  . Abdominal Pain   Beth Gibson is a 31 y.o. Who presents to MAU reporting continuously heavy vaginal bleeding. She was seen in MAU on 02/04/17 for similar concerns. She states she has been unable to follow-up as the [facility] next available appointment isn't for a few more weeks. She states she was at work and became really tired and weak. She reports being weak and tired since 02/02/17 and this has unchanged. Reports supervisor told her she looked pale and she was brought for evaluation.  She states over the past week the bleeding has been heavy some days and light some days. The bleeding has been light (changing pad every 3-4 hours since 5pm) over the past 12 hours. She also c/o of right lower pelvic pain. The pain has been ongoing since the bleeding started on 01/31/17. She states the pain feels like a constant ache and stabbing at times. She reports pain 8/10. Reports nothing aggravates bleeding or pain. She has been taking iron supplements for anemia r/t bleeding, ibuprofen 800mg  for pain and cramping, and a birth control supplement for birth control and bleeding. Reports none of those interventions are seeming to help. She also have been doing warm compresses to the abdomen at night with some relief. Denies any fever, chills, urinary symptoms, back pain, headaches, nausea or vomiting.    The history is provided by the patient.  Vaginal Bleeding  This is a recurrent problem. The current episode started 1 to 4 weeks ago. The problem occurs constantly. The problem has been waxing and waning. Associated symptoms include abdominal pain (lower right ), fatigue and weakness. Nothing aggravates the symptoms. She has tried acetaminophen, heat, lying down, NSAIDs, rest and relaxation for the symptoms. The  treatment provided mild relief.    OB History    Gravida Para Term Preterm AB Living   4 2 0 2 2 2    SAB TAB Ectopic Multiple Live Births   1 0 0 0 2      Past Medical History:  Diagnosis Date  . Abscess of axilla, right   . BV (bacterial vaginosis)   . Chlamydia   . Cholelithiases   . Headache(784.0)   . Preterm labor   . Trichomonas   . UTI (lower urinary tract infection)   . Yeast vaginitis     Past Surgical History:  Procedure Laterality Date  . CESAREAN SECTION    . CESAREAN SECTION  04/18/2012   Procedure: CESAREAN SECTION;  Surgeon: Guss Bunde, MD;  Location: [facility];  Service: Gynecology;  Laterality: N/A;  repeat  . cyst removed from L buttock    . CYSTECTOMY  2009   left buttock  . INCISION AND DRAINAGE ABSCESS Right 11/01/2014   Procedure: INCISION AND DRAINAGE RIGHT AXILLARY ABSCESS;  Surgeon: Leighton Ruff, MD;  Location: [facility];  Service: General;  Laterality: Right;  . IRRIGATION AND DEBRIDEMENT ABSCESS Right 08/09/2015   Procedure: IRRIGATION AND DEBRIDEMENT AXILLARY ABSCESS;  Surgeon: Alphonsa Overall, MD;  Location: [facility];  Service: General;  Laterality: Right;  . WISDOM TOOTH EXTRACTION    . WISDOM TOOTH EXTRACTION  2009    Family History  Problem Relation Age of Onset  . Hypertension Mother   . Diabetes Mellitus I Father  Social History  Substance Use Topics  . Smoking status: Current Some Day Smoker    Types: Cigars  . Smokeless tobacco: Never Used  . Alcohol use No    Allergies:  Allergies  Allergen Reactions  . Vicodin [Hydrocodone-Acetaminophen] Itching    Prescriptions Prior to Admission  Medication Sig Dispense Refill Last Dose  . acetaminophen (TYLENOL) 500 MG tablet Take 2 tablets (1,000 mg total) by mouth every 6 (six) hours as needed. (Patient taking differently: Take 1,000 mg by mouth every 6 (six) hours as needed for mild pain. ) 30 tablet 0 02/10/2017 at Unknown time  . ferrous sulfate (FERROUSUL) 325 (65 FE) MG tablet  Take 1 tablet (325 mg total) by mouth 2 (two) times daily with a meal. 60 tablet 8 02/09/2017 at Unknown time  . oxyCODONE-acetaminophen (PERCOCET/ROXICET) 5-325 MG tablet Take 1-2 tablets by mouth every 6 (six) hours as needed. 15 tablet 0 02/05/2017    Review of Systems  Constitutional: Positive for fatigue.  HENT: Negative.   Eyes: Negative.   Respiratory: Negative.   Cardiovascular: Negative.   Gastrointestinal: Positive for abdominal pain (lower right ).  Endocrine: Negative.   Genitourinary: Positive for vaginal bleeding.  Musculoskeletal: Negative.   Skin: Negative.   Allergic/Immunologic: Negative.   Neurological: Positive for weakness.  Psychiatric/Behavioral: Negative.    Physical Exam   Height 5\' 8"  (1.727 m), weight 206 lb (93.4 kg), last menstrual period 01/31/2017, unknown if currently breastfeeding.  Physical Exam  Constitutional: She is oriented to person, place, and time. She appears well-developed and well-nourished.  HENT:  Head: Normocephalic and atraumatic.  Eyes: Conjunctivae and EOM are normal.  Neck: Normal range of motion. Neck supple.  Cardiovascular: Normal rate, regular rhythm and normal heart sounds.   Respiratory: Effort normal and breath sounds normal.  GI: Soft. Bowel sounds are normal. There is tenderness (lower right quadrant/pelvic area ).  Musculoskeletal: Normal range of motion.  Neurological: She is alert and oriented to person, place, and time. She has normal reflexes.  Skin: Skin is warm and dry.  Psychiatric: She has a normal mood and affect. Her behavior is normal. Judgment and thought content normal.   Pelvic Exam: Cervix was pink, visually closed, moderate amount of blood noted, cleaned with fox swab, no active bleeding observed,  no lesions noted. Vaginal walls normal. Bimanual exam: cervix firm, uterus nonenlarged, nontender, adnexa without tenderness or mass, no CMT.  MAU Course  Procedures 1) CBC- Hgb stable ( 7.8 compared to  02/04/17 7.3) 2) Urine Preg-Reviewed negative 3) IM Toradol -administer by RN (reported pain relief)   MDM Pt informed that there is no active hemorrhaging and hemoglovin is stable. She will continue with iron, ibuprofen, and birth control. She will be scheduled for an appointment at the clinic and an outpatient [place].   Assessment and Plan  Dysfunctional Uterine Bleeding  Anemia   Patient is being discharged home, She has been informed that she will be scheduled to have an appointment in the clinic with Dr. Ilda Basset and also will have an ultrasound. Bleeding precautions discussed. She will continue with ibuprofen 600 mg for pain and cramping, She will increase her iron to BID, and will continue with current birth control. Advised to double birth control for the next 3 days to facilitate decrease or stopping bleed. Educated on the use of other birth control measures such as IUD to facilitate decrease in bleeding and birth control effectiveness. She verbalized understanding and will research and make a decision to  discuss at her appointment with Dr. Ilda Basset. She is aware to return to MAU or another ED if she develops worsening symptoms or other concerns.  Beth Gibson 02/10/2017, 10:08 AM

## 2017-02-11 ENCOUNTER — Ambulatory Visit (HOSPITAL_COMMUNITY): Payer: Medicaid Other | Attending: Obstetrics and Gynecology

## 2017-02-23 ENCOUNTER — Encounter (HOSPITAL_COMMUNITY): Payer: Self-pay | Admitting: *Deleted

## 2017-02-23 ENCOUNTER — Ambulatory Visit (HOSPITAL_COMMUNITY)
Admission: RE | Admit: 2017-02-23 | Discharge: 2017-02-23 | Disposition: A | Discharge status: Home / Self Care | Payer: Medicaid Other | Source: Ambulatory Visit | Attending: Obstetrics and Gynecology | Admitting: Obstetrics and Gynecology

## 2017-02-23 ENCOUNTER — Encounter: Payer: Self-pay | Admitting: Obstetrics & Gynecology

## 2017-02-23 ENCOUNTER — Ambulatory Visit (HOSPITAL_COMMUNITY): Admission: RE | Admit: 2017-02-23 | Payer: Medicaid Other | Source: Ambulatory Visit

## 2017-02-23 DIAGNOSIS — N939 Abnormal uterine and vaginal bleeding, unspecified: Secondary | ICD-10-CM | POA: Diagnosis present

## 2017-02-23 NOTE — Progress Notes (Signed)
[facility] Attending Note   Called to evaluate patient with new diagnosis of possible retained products of conception s/p TAB in 11/2016.  Had continual bleeding and pain since then and had 3 MAU visits since then for same complaint.   She was scheduled for outpatient ultrasound today; still reports moderate bleeding and pain.  US Transvaginal Non-ob  Result Date: 02/23/2017 CLINICAL DATA:  Abnormal uterine bleeding since abortion in December EXAM: ULTRASOUND PELVIS TRANSVAGINAL TECHNIQUE: Transvaginal ultrasound examination of the pelvis was performed including evaluation of the uterus, ovaries, adnexal regions, and pelvic cul-de-sac. COMPARISON:  None. FINDINGS: Uterus Measurements: 8.4 x 4.8 x 5.9 cm. No fibroids or other mass visualized. Endometrium Thickness: Endometrium is thickened and heterogeneous measuring 18.4 mm. Endometrium as hypervascular. Right ovary Measurements: 3.4 x 2.5 x 2.8 cm. Normal appearance/no adnexal mass. Left ovary Measurements: 3.7 x 2.4 x 2.4 cm. Normal appearance/no adnexal mass. Other findings:  No abnormal free fluid IMPRESSION: Hypervascular, thickened, heterogeneous endometrium concerning for retained products of conception. These results will be called to the ordering clinician or representative by the Radiology Department at the imaging location. Electronically Signed   By: Rolm Baptise M.D.   On: 02/23/2017 12:02   On encounter in Ultrasound Suite, she is hemodynamically stable and reported having a full breakfast recently.  Patient was offered repeat D&E; scheduled on 02/24/17 at 10 am with Dr. Ihor Dow.  Routine preoperative instructions of having nothing to eat or drink after midnight on the day prior to surgery and also coming to the hospital 1.5 hours prior to her time of surgery were also emphasized.  Short Stay notified of patient.    Verita Schneiders, MD, [facility] Attending [place], [facility]

## 2017-02-24 ENCOUNTER — Ambulatory Visit (HOSPITAL_COMMUNITY): Payer: Medicaid Other | Admitting: Anesthesiology

## 2017-02-24 ENCOUNTER — Encounter (HOSPITAL_COMMUNITY)
Admission: AD | Disposition: A | Discharge status: Home / Self Care | Payer: Self-pay | Source: Ambulatory Visit | Attending: Obstetrics & Gynecology

## 2017-02-24 ENCOUNTER — Ambulatory Visit (HOSPITAL_COMMUNITY)
Admission: AD | Admit: 2017-02-24 | Discharge: 2017-02-24 | Disposition: A | Discharge status: Home / Self Care | Payer: Medicaid Other | Source: Ambulatory Visit | Attending: Obstetrics & Gynecology | Admitting: Obstetrics & Gynecology

## 2017-02-24 ENCOUNTER — Encounter (HOSPITAL_COMMUNITY): Payer: Self-pay

## 2017-02-24 DIAGNOSIS — D509 Iron deficiency anemia, unspecified: Secondary | ICD-10-CM | POA: Diagnosis not present

## 2017-02-24 DIAGNOSIS — J45909 Unspecified asthma, uncomplicated: Secondary | ICD-10-CM | POA: Diagnosis not present

## 2017-02-24 DIAGNOSIS — E669 Obesity, unspecified: Secondary | ICD-10-CM | POA: Insufficient documentation

## 2017-02-24 DIAGNOSIS — F17290 Nicotine dependence, other tobacco product, uncomplicated: Secondary | ICD-10-CM | POA: Insufficient documentation

## 2017-02-24 DIAGNOSIS — E875 Hyperkalemia: Secondary | ICD-10-CM | POA: Diagnosis not present

## 2017-02-24 DIAGNOSIS — Z6832 Body mass index (BMI) 32.0-32.9, adult: Secondary | ICD-10-CM | POA: Diagnosis not present

## 2017-02-24 DIAGNOSIS — Z885 Allergy status to narcotic agent status: Secondary | ICD-10-CM | POA: Diagnosis not present

## 2017-02-24 DIAGNOSIS — O034 Incomplete spontaneous abortion without complication: Secondary | ICD-10-CM

## 2017-02-24 DIAGNOSIS — Z9889 Other specified postprocedural states: Secondary | ICD-10-CM

## 2017-02-24 DIAGNOSIS — F333 Major depressive disorder, recurrent, severe with psychotic symptoms: Secondary | ICD-10-CM | POA: Diagnosis not present

## 2017-02-24 HISTORY — PX: DILATION AND EVACUATION: SHX1459

## 2017-02-24 HISTORY — DX: Anemia, unspecified: D64.9

## 2017-02-24 HISTORY — DX: Unspecified asthma, uncomplicated: J45.909

## 2017-02-24 LAB — CBC
HEMATOCRIT: 27.1 % — AB (ref 36.0–46.0)
Hemoglobin: 8 g/dL — ABNORMAL LOW (ref 12.0–15.0)
MCH: 17.5 pg — AB (ref 26.0–34.0)
MCHC: 29.5 g/dL — AB (ref 30.0–36.0)
MCV: 59.2 fL — AB (ref 78.0–100.0)
PLATELETS: 210 10*3/uL (ref 150–400)
RBC: 4.58 MIL/uL (ref 3.87–5.11)
RDW: 20.3 % — AB (ref 11.5–15.5)
WBC: 4.3 10*3/uL (ref 4.0–10.5)

## 2017-02-24 LAB — TYPE AND SCREEN
ABO/RH(D): B POS
Antibody Screen: NEGATIVE

## 2017-02-24 SURGERY — DILATION AND EVACUATION, UTERUS
Anesthesia: Monitor Anesthesia Care | Site: Uterus

## 2017-02-24 MED ORDER — DIPHENHYDRAMINE HCL 50 MG/ML IJ SOLN
12.5000 mg | Freq: Once | INTRAMUSCULAR | Status: AC
  Administered 2017-02-24: 12.5 mg via INTRAVENOUS

## 2017-02-24 MED ORDER — SCOPOLAMINE 1 MG/3DAYS TD PT72
MEDICATED_PATCH | TRANSDERMAL | Status: AC
  Filled 2017-02-24: qty 1

## 2017-02-24 MED ORDER — DOXYCYCLINE HYCLATE 100 MG PO CAPS: 100.0000 mg | ORAL_CAPSULE | Freq: Two times a day (BID) | ORAL | 0 refills | Status: DC

## 2017-02-24 MED ORDER — LIDOCAINE HCL (PF) 1 % IJ SOLN
INTRAMUSCULAR | Status: AC
  Filled 2017-02-24: qty 5

## 2017-02-24 MED ORDER — PROPOFOL 10 MG/ML IV BOLUS
INTRAVENOUS | Status: AC
  Filled 2017-02-24: qty 40

## 2017-02-24 MED ORDER — MIDAZOLAM HCL 2 MG/2ML IJ SOLN
INTRAMUSCULAR | Status: DC | PRN
  Administered 2017-02-24: 2 mg via INTRAVENOUS

## 2017-02-24 MED ORDER — ACETAMINOPHEN 160 MG/5ML PO SOLN
ORAL | Status: AC
  Filled 2017-02-24: qty 20.3

## 2017-02-24 MED ORDER — DOXYCYCLINE HYCLATE 100 MG IV SOLR
200.0000 mg | INTRAVENOUS | Status: AC
  Administered 2017-02-24: 200 mg via INTRAVENOUS
  Filled 2017-02-24: qty 200

## 2017-02-24 MED ORDER — LACTATED RINGERS IV SOLN
INTRAVENOUS | Status: DC
  Administered 2017-02-24 (×2): via INTRAVENOUS

## 2017-02-24 MED ORDER — DIPHENHYDRAMINE HCL 50 MG/ML IJ SOLN
INTRAMUSCULAR | Status: AC
  Filled 2017-02-24: qty 1

## 2017-02-24 MED ORDER — BUPIVACAINE HCL (PF) 0.5 % IJ SOLN
INTRAMUSCULAR | Status: DC | PRN
  Administered 2017-02-24: 20 mL

## 2017-02-24 MED ORDER — FENTANYL CITRATE (PF) 100 MCG/2ML IJ SOLN
25.0000 ug | INTRAMUSCULAR | Status: DC | PRN
  Administered 2017-02-24: 50 ug via INTRAVENOUS

## 2017-02-24 MED ORDER — PROPOFOL 10 MG/ML IV BOLUS
INTRAVENOUS | Status: AC
  Filled 2017-02-24: qty 20

## 2017-02-24 MED ORDER — FENTANYL CITRATE (PF) 100 MCG/2ML IJ SOLN
INTRAMUSCULAR | Status: AC
  Filled 2017-02-24: qty 2

## 2017-02-24 MED ORDER — GLYCOPYRROLATE 0.2 MG/ML IJ SOLN
INTRAMUSCULAR | Status: DC | PRN
  Administered 2017-02-24: 0.2 mg via INTRAVENOUS

## 2017-02-24 MED ORDER — LIDOCAINE HCL (CARDIAC) 20 MG/ML IV SOLN
INTRAVENOUS | Status: DC | PRN
  Administered 2017-02-24: 50 mg via INTRAVENOUS

## 2017-02-24 MED ORDER — PROPOFOL 500 MG/50ML IV EMUL
INTRAVENOUS | Status: DC | PRN
  Administered 2017-02-24: 150 ug/kg/min via INTRAVENOUS

## 2017-02-24 MED ORDER — PROMETHAZINE HCL 25 MG/ML IJ SOLN: 6.2500 mg | INTRAMUSCULAR | Status: DC | PRN

## 2017-02-24 MED ORDER — MIDAZOLAM HCL 2 MG/2ML IJ SOLN
INTRAMUSCULAR | Status: AC
  Filled 2017-02-24: qty 2

## 2017-02-24 MED ORDER — FENTANYL CITRATE (PF) 100 MCG/2ML IJ SOLN
INTRAMUSCULAR | Status: DC | PRN
  Administered 2017-02-24 (×2): 50 ug via INTRAVENOUS

## 2017-02-24 MED ORDER — LACTATED RINGERS IV SOLN
INTRAVENOUS | Status: DC
  Administered 2017-02-24: 125 mL/h via INTRAVENOUS

## 2017-02-24 MED ORDER — ACETAMINOPHEN 160 MG/5ML PO SOLN
650.0000 mg | Freq: Once | ORAL | Status: AC
  Administered 2017-02-24: 650 mg via ORAL

## 2017-02-24 MED ORDER — KETOROLAC TROMETHAMINE 30 MG/ML IJ SOLN
INTRAMUSCULAR | Status: AC
  Filled 2017-02-24: qty 1

## 2017-02-24 MED ORDER — SCOPOLAMINE 1 MG/3DAYS TD PT72
1.0000 | MEDICATED_PATCH | Freq: Once | TRANSDERMAL | Status: DC
  Administered 2017-02-24: 1.5 mg via TRANSDERMAL

## 2017-02-24 MED ORDER — KETOROLAC TROMETHAMINE 30 MG/ML IJ SOLN
INTRAMUSCULAR | Status: DC | PRN
  Administered 2017-02-24: 30 mg via INTRAVENOUS

## 2017-02-24 MED ORDER — GLYCOPYRROLATE 0.2 MG/ML IJ SOLN
INTRAMUSCULAR | Status: AC
  Filled 2017-02-24: qty 1

## 2017-02-24 MED ORDER — ONDANSETRON HCL 4 MG/2ML IJ SOLN
INTRAMUSCULAR | Status: AC
  Filled 2017-02-24: qty 2

## 2017-02-24 MED ORDER — BUPIVACAINE HCL (PF) 0.5 % IJ SOLN
INTRAMUSCULAR | Status: AC
  Filled 2017-02-24: qty 30

## 2017-02-24 MED ORDER — DEXAMETHASONE SODIUM PHOSPHATE 10 MG/ML IJ SOLN
INTRAMUSCULAR | Status: DC | PRN
  Administered 2017-02-24: 4 mg via INTRAVENOUS

## 2017-02-24 MED ORDER — ONDANSETRON HCL 4 MG/2ML IJ SOLN
INTRAMUSCULAR | Status: DC | PRN
  Administered 2017-02-24: 4 mg via INTRAVENOUS

## 2017-02-24 SURGICAL SUPPLY — 19 items
CATH ROBINSON RED A/P 16FR (CATHETERS) ×2 IMPLANT
CLOTH BEACON ORANGE TIMEOUT ST (SAFETY) ×2 IMPLANT
DECANTER SPIKE VIAL GLASS SM (MISCELLANEOUS) ×2 IMPLANT
GLOVE BIOGEL PI IND STRL 7.0 (GLOVE) ×2 IMPLANT
GLOVE BIOGEL PI INDICATOR 7.0 (GLOVE) ×2
GLOVE ECLIPSE 7.0 STRL STRAW (GLOVE) ×4 IMPLANT
GOWN STRL REUS W/TWL LRG LVL3 (GOWN DISPOSABLE) ×4 IMPLANT
GOWN STRL REUS W/TWL XL LVL3 (GOWN DISPOSABLE) ×2 IMPLANT
KIT BERKELEY 1ST TRIMESTER 3/8 (MISCELLANEOUS) ×2 IMPLANT
NS IRRIG 1000ML POUR BTL (IV SOLUTION) ×2 IMPLANT
PACK VAGINAL MINOR WOMEN LF (CUSTOM PROCEDURE TRAY) ×2 IMPLANT
PAD OB MATERNITY 4.3X12.25 (PERSONAL CARE ITEMS) ×2 IMPLANT
PAD PREP 24X48 CUFFED NSTRL (MISCELLANEOUS) ×2 IMPLANT
SET BERKELEY SUCTION TUBING (SUCTIONS) ×2 IMPLANT
TOWEL OR 17X24 6PK STRL BLUE (TOWEL DISPOSABLE) ×4 IMPLANT
VACURETTE 10 RIGID CVD (CANNULA) IMPLANT
VACURETTE 7MM CVD STRL WRAP (CANNULA) ×1 IMPLANT
VACURETTE 8 RIGID CVD (CANNULA) IMPLANT
VACURETTE 9 RIGID CVD (CANNULA) IMPLANT

## 2017-02-24 NOTE — Op Note (Signed)
02/24/2017  10:32 AM  PATIENT:  Beth Gibson  31 y.o. female  PRE-OPERATIVE DIAGNOSIS:  retained products   POST-OPERATIVE DIAGNOSIS:  RETAINED PRODUCTS  PROCEDURE:  Procedure(s): DILATATION AND EVACUATION (N/A)  SURGEON:  Surgeon(s) and Role:    * Lavonia Drafts, MD - Primary  ANESTHESIA:   paracervical block and MAC  EBL:  Total I/O In: 500 [I.V.:500] Out: 70 [Urine:45; Blood:25]  BLOOD ADMINISTERED:none  DRAINS: none   LOCAL MEDICATIONS USED:  MARCAINE     SPECIMEN:  Source of Specimen:  Products of conception  DISPOSITION OF SPECIMEN:  PATHOLOGY  COUNTS:  YES  TOURNIQUET:  * No tourniquets in log *  DICTATION: .Note written in EPIC  PLAN OF CARE: Discharge to home after PACU  PATIENT DISPOSITION:  PACU - hemodynamically stable.   Delay start of Pharmacological VTE agent (>24hrs) due to surgical blood loss or risk of bleeding: not applicable  INDICATIONS: 31 y.o. [id number] with retained POC ~3 months after elective aboriton.   Risks of surgery were discussed with the patient including but not limited to: bleeding which may require transfusion; infection which may require antibiotics; injury to uterus or surrounding organs; need for additional procedures including laparotomy or laparoscopy; possibility of intrauterine scarring which may impair future fertility; and other postoperative/anesthesia complications. Written informed consent was obtained.    FINDINGS:  A 6 week size uterus, small amounts of products of conception, specimen sent to pathology.  PROCEDURE DETAILS:  The patient was taken to the operating room where monitored intravenous sedation was administered and was found to be adequate.  After an adequate timeout was performed, she was placed in the dorsal lithotomy position and examined; then prepped and draped in the sterile manner.   Her bladder was catheterized for an unmeasured amount of clear, yellow urine. A vaginal speculum was then placed in  the patient's vagina and a single tooth tenaculum was applied to the anterior lip of the cervix.  A paracervical block using 20 ml of 0.5% Marcaine was administered. The cervix was gently dilated to accommodate a 7 mm curved suction curette that was gently advanced to the uterine fundus.  The suction device was then activated to a suction of 50-26mHg and curette slowly rotated to clear the uterus of products of conception.  A sharp curettage was then performed to confirm complete emptying of the uterus. There was minimal bleeding noted and the tenaculum removed with good hemostasis noted.   All instruments were removed from the patient's vagina. The patient tolerated the procedure well and was taken to the recovery area awake, and in stable condition. There were no immediate complications.  The patient will be discharged to home as per PACU criteria.  Routine postoperative instructions given.  She was prescribed Ibuprofen and Doxycycline.  She will follow up in the clinic in 2 weeks for postoperative evaluation.  Rosemae Mcquown L. Harraway-Smith, M.D., [facility]

## 2017-02-24 NOTE — Anesthesia Postprocedure Evaluation (Signed)
Anesthesia Post Note  Patient: Beth Gibson  Procedure(s) Performed: Procedure(s) (LRB): DILATATION AND EVACUATION (N/A)  Patient location during evaluation: PACU Anesthesia Type: MAC Level of consciousness: awake and alert Pain management: pain level controlled Vital Signs Assessment: post-procedure vital signs reviewed and stable Respiratory status: spontaneous breathing, nonlabored ventilation, respiratory function stable and patient connected to nasal cannula oxygen Cardiovascular status: stable and blood pressure returned to baseline Anesthetic complications: no        Last Vitals:  Vitals:   02/24/17 1130 02/24/17 1155  BP: (!) 128/107 131/78  Pulse: (!) 59 61  Resp: 15 20  Temp: 36.6 C     Last Pain:  Vitals:   02/24/17 1155  TempSrc:   PainSc: 3    Pain Goal: Patients Stated Pain Goal: 5 (02/24/17 9163)               [name]

## 2017-02-24 NOTE — Discharge Instructions (Addendum)
Dilation and Curettage or Vacuum Curettage, Care After This sheet gives you information about how to care for yourself after your procedure. Your health care provider may also give you more specific instructions. If you have problems or questions, contact your health care provider. What can I expect after the procedure? After your procedure, it is common to have:  Mild pain or cramping.  Some vaginal bleeding or spotting. These may last for up to 2 weeks after your procedure. Follow these instructions at home: Activity    Do not drive or use heavy machinery while taking prescription pain medicine.  Avoid driving for the first 24 hours after your procedure.  Take frequent, short walks, followed by rest periods, throughout the day. Ask your health care provider what activities are safe for you. After 1-2 days, you may be able to return to your normal activities.  Do not lift anything heavier than 10 lb (4.5 kg) until your health care provider approves.  For at least 2 weeks, or as long as told by your health care provider, do not:  Douche.  Use tampons.  Have sexual intercourse. General instructions    Take over-the-counter and prescription medicines only as told by your health care provider. This is especially important if you take blood thinning medicine.  Do not take baths, swim, or use a hot tub until your health care provider approves. Take showers instead of baths.  Wear compression stockings as told by your health care provider. These stockings help to prevent blood clots and reduce swelling in your legs.  It is your responsibility to get the results of your procedure. Ask your health care provider, or the department performing the procedure, when your results will be ready.  Keep all follow-up visits as told by your health care provider. This is important. Contact a health care provider if:  You have severe cramps that get worse or that do not get better with  medicine.  You have severe abdominal pain.  You cannot drink fluids without vomiting.  You develop pain in a different area of your pelvis.  You have bad-smelling vaginal discharge.  You have a rash. Get help right away if:  You have vaginal bleeding that soaks more than one sanitary pad in 1 hour, for 2 hours in a row.  You pass large blood clots from your vagina.  You have a fever that is above 100.11F (38.0C).  Your abdomen feels very tender or hard.  You have chest pain.  You have shortness of breath.  You cough up blood.  You feel dizzy or light-headed.  You faint.  You have pain in your neck or shoulder area. This information is not intended to replace advice given to you by your health care provider. Make sure you discuss any questions you have with your health care provider. Document Released: 12/04/2000 Document Revised: 08/05/2016 Document Reviewed: 07/09/2016 Elsevier Interactive Patient Education  2017 [facility] Instructions  Activity: Get plenty of rest for the remainder of the day. A responsible adult should stay with you for 24 hours following the procedure.  For the next 24 hours, DO NOT: -Drive a car -Paediatric nurse -Drink alcoholic beverages -Take any medication unless instructed by your physician -Make any legal decisions or sign important papers.  Meals: Start with liquid foods such as gelatin or soup. Progress to regular foods as tolerated. Avoid greasy, spicy, heavy foods. If nausea and/or vomiting occur, drink only clear liquids  until the nausea and/or vomiting subsides. Call your physician if vomiting continues.  Special Instructions/Symptoms: Your throat may feel dry or sore from the anesthesia or the breathing tube placed in your throat during surgery. If this causes discomfort, gargle with warm salt water. The discomfort should disappear within 24 hours.  If you had a scopolamine patch placed  behind your ear for the management of post- operative nausea and/or vomiting:  1. The medication in the patch is effective for 72 hours, after which it should be removed.  Wrap patch in a tissue and discard in the trash. Wash hands thoroughly with soap and water. 2. You may remove the patch earlier than 72 hours if you experience unpleasant side effects which may include dry mouth, dizziness or visual disturbances. 3. Avoid touching the patch. Wash your hands with soap and water after contact with the patch.

## 2017-02-24 NOTE — Anesthesia Preprocedure Evaluation (Signed)
Anesthesia Evaluation  Patient identified by MRN, date of birth, ID band Patient awake    Reviewed: Allergy & Precautions, NPO status , Patient's Chart, lab work & pertinent test results  Airway Mallampati: II  TM Distance: >3 FB Neck ROM: Full    Dental  (+) Dental Advisory Given   Pulmonary asthma , Current Smoker,    breath sounds clear to auscultation       Cardiovascular negative cardio ROS   Rhythm:Regular Rate:Normal     Neuro/Psych Depression negative neurological ROS     GI/Hepatic negative GI ROS, Neg liver ROS,   Endo/Other  negative endocrine ROS  Renal/GU negative Renal ROS     Musculoskeletal   Abdominal   Peds  Hematology  (+) anemia ,   Anesthesia Other Findings   Reproductive/Obstetrics                             Lab Results  Component Value Date   WBC 4.5 02/10/2017   HGB 7.8 (L) 02/10/2017   HCT 26.0 (L) 02/10/2017   MCV 58.0 (L) 02/10/2017   PLT 209 02/10/2017    Anesthesia Physical Anesthesia Plan  ASA: II  Anesthesia Plan: MAC   Post-op Pain Management:    Induction: Intravenous  Airway Management Planned: Natural Airway and Simple Face Mask  Additional Equipment:   Intra-op Plan:   Post-operative Plan:   Informed Consent: I have reviewed the patients History and Physical, chart, labs and discussed the procedure including the risks, benefits and alternatives for the proposed anesthesia with the patient or authorized representative who has indicated his/her understanding and acceptance.     Plan Discussed with: CRNA  Anesthesia Plan Comments:         Anesthesia Quick Evaluation

## 2017-02-24 NOTE — Brief Op Note (Signed)
02/24/2017  10:32 AM  PATIENT:  Beth Gibson  31 y.o. female  PRE-OPERATIVE DIAGNOSIS:  retained products   POST-OPERATIVE DIAGNOSIS:  RETAINED PRODUCTS  PROCEDURE:  Procedure(s): DILATATION AND EVACUATION (N/A)  SURGEON:  Surgeon(s) and Role:    * Lavonia Drafts, MD - Primary  ANESTHESIA:   paracervical block and MAC  EBL:  Total I/O In: 500 [I.V.:500] Out: 70 [Urine:45; Blood:25]  BLOOD ADMINISTERED:none  DRAINS: none   LOCAL MEDICATIONS USED:  MARCAINE     SPECIMEN:  Source of Specimen:  Products of conception  DISPOSITION OF SPECIMEN:  PATHOLOGY  COUNTS:  YES  TOURNIQUET:  * No tourniquets in log *  DICTATION: .Note written in EPIC  PLAN OF CARE: Discharge to home after PACU  PATIENT DISPOSITION:  PACU - hemodynamically stable.   Delay start of Pharmacological VTE agent (>24hrs) due to surgical blood loss or risk of bleeding: not applicable  Luisdavid Hamblin L. Harraway-Smith, M.D., [facility]

## 2017-02-24 NOTE — Transfer of Care (Signed)
Immediate Anesthesia Transfer of Care Note  Patient: Beth Gibson  Procedure(s) Performed: Procedure(s): DILATATION AND EVACUATION (N/A)  Patient Location: PACU  Anesthesia Type:MAC  Level of Consciousness: awake, alert  and oriented  Airway & Oxygen Therapy: Patient Spontanous Breathing and Patient connected to face mask oxygen  Post-op Assessment: Report given to RN and Post -op Vital signs reviewed and stable  Post vital signs: Reviewed and stable  Last Vitals:  Vitals:   02/24/17 0938  BP: 126/78  Pulse: 70  Resp: 16  Temp: 36.9 C    Last Pain:  Vitals:   02/24/17 0938  TempSrc: Oral  PainSc: 8       Patients Stated Pain Goal: 5 (64/31/42 7670)  Complications: No apparent anesthesia complications

## 2017-02-24 NOTE — H&P (Signed)
Preoperative History and Physical  Beth Gibson is a 31 y.o. [phone] here for surgical management of retained POC after EAB in 11/2016.   Proposed surgery: dilatation and curretage  Past Medical History:  Diagnosis Date  . Abscess of axilla, right   . Anemia   . Asthma    NO MEDS  . BV (bacterial vaginosis)   . Chlamydia   . Cholelithiases   . Headache(784.0)    MIGRAINES  . Preterm labor   . Trichomonas   . UTI (lower urinary tract infection)   . Yeast vaginitis    Past Surgical History:  Procedure Laterality Date  . CESAREAN SECTION    . CESAREAN SECTION  04/18/2012   Procedure: CESAREAN SECTION;  Surgeon: Guss Bunde, MD;  Location: [facility];  Service: Gynecology;  Laterality: N/A;  repeat  . cyst removed from L buttock    . CYSTECTOMY  2009   left buttock  . INCISION AND DRAINAGE ABSCESS Right 11/01/2014   Procedure: INCISION AND DRAINAGE RIGHT AXILLARY ABSCESS;  Surgeon: Leighton Ruff, MD;  Location: [facility];  Service: General;  Laterality: Right;  . IRRIGATION AND DEBRIDEMENT ABSCESS Right 08/09/2015   Procedure: IRRIGATION AND DEBRIDEMENT AXILLARY ABSCESS;  Surgeon: Alphonsa Overall, MD;  Location: [facility];  Service: General;  Laterality: Right;  . WISDOM TOOTH EXTRACTION    . WISDOM TOOTH EXTRACTION  2009   OB History    Gravida Para Term Preterm AB Living   4 2 0 2 2 2    SAB TAB Ectopic Multiple Live Births   1 0 0 0 2     Patient denies any cervical dysplasia or STIs. Prescriptions Prior to Admission  Medication Sig Dispense Refill Last Dose  . acetaminophen (TYLENOL) 500 MG tablet Take 500 mg by mouth every 6 (six) hours as needed for moderate pain.   02/23/2017 at Unknown time  . ferrous sulfate (FERROUSUL) 325 (65 FE) MG tablet Take 1 tablet (325 mg total) by mouth 2 (two) times daily with a meal. 60 tablet 8 02/23/2017 at Unknown time  . ibuprofen (ADVIL,MOTRIN) 600 MG tablet Take 1 tablet (600 mg total) by mouth every 6 (six) hours as needed. 30 tablet 0 Past  Week at Unknown time  . oxyCODONE-acetaminophen (PERCOCET/ROXICET) 5-325 MG tablet Take 1-2 tablets by mouth every 6 (six) hours as needed. 15 tablet 0     Allergies  Allergen Reactions  . Vicodin [Hydrocodone-Acetaminophen] Itching   Social History:   reports that she has been smoking Cigars and Cigarettes.  She has a 0.12 pack-year smoking history. She has never used smokeless tobacco. She reports that she uses drugs, including Marijuana, about 3 times per week. She reports that she does not drink alcohol. Family History  Problem Relation Age of Onset  . Hypertension Mother   . Diabetes Mellitus I Father     Review of Systems: Noncontributory  PHYSICAL EXAM: Height 5\' 8"  (1.727 m), weight 211 lb (95.7 kg), last menstrual period 01/31/2017, unknown if currently breastfeeding. General appearance - alert, well appearing, and in no distress Chest - clear to auscultation, no wheezes, rales or rhonchi, symmetric air entry Heart - normal rate and regular rhythm Abdomen - soft, nontender, nondistended, no masses or organomegaly Pelvic - examination not indicated Extremities - peripheral pulses normal, no pedal edema, no clubbing or cyanosis  Labs: Results for orders placed or performed during the hospital encounter of 02/10/17 (from the past 336 hour(s))  CBC   Collection Time: 02/10/17  9:58 AM  Result Value Ref Range   WBC 4.5 4.0 - 10.5 K/uL   RBC 4.48 3.87 - 5.11 MIL/uL   Hemoglobin 7.8 (L) 12.0 - 15.0 g/dL   HCT 26.0 (L) 36.0 - 46.0 %   MCV 58.0 (L) 78.0 - 100.0 fL   MCH 17.4 (L) 26.0 - 34.0 pg   MCHC 30.0 30.0 - 36.0 g/dL   RDW 20.7 (H) 11.5 - 15.5 %   Platelets 209 150 - 400 K/uL    Imaging Studies: US Transvaginal Non-ob  Result Date: 02/23/2017 CLINICAL DATA:  Abnormal uterine bleeding since abortion in December EXAM: ULTRASOUND PELVIS TRANSVAGINAL TECHNIQUE: Transvaginal ultrasound examination of the pelvis was performed including evaluation of the uterus, ovaries,  adnexal regions, and pelvic cul-de-sac. COMPARISON:  None. FINDINGS: Uterus Measurements: 8.4 x 4.8 x 5.9 cm. No fibroids or other mass visualized. Endometrium Thickness: Endometrium is thickened and heterogeneous measuring 18.4 mm. Endometrium as hypervascular. Right ovary Measurements: 3.4 x 2.5 x 2.8 cm. Normal appearance/no adnexal mass. Left ovary Measurements: 3.7 x 2.4 x 2.4 cm. Normal appearance/no adnexal mass. Other findings:  No abnormal free fluid IMPRESSION: Hypervascular, thickened, heterogeneous endometrium concerning for retained products of conception. These results will be called to the ordering clinician or representative by the Radiology Department at the imaging location. Electronically Signed   By: Rolm Baptise M.D.   On: 02/23/2017 12:02    Assessment: Patient Active Problem List   Diagnosis Date Noted  . Status post elective abortion 12/10/2016  . Severe episode of recurrent major depressive disorder, with psychotic features ([place])   . MDD (major depressive disorder) 03/16/2016  . Microcytic anemia 11/01/2014  . Hyperkalemia 11/01/2014  . Axillary abscess 10/31/2014  . Obesity (BMI 30-39.9) 12/10/2011  . Previous cesarean delivery, antepartum condition or complication 75/64/3329    Plan: Patient will undergo surgical management with dilatation and curretage.   The risks of surgery were discussed in detail with the patient including but not limited to: bleeding which may require transfusion or reoperation; infection which may require antibiotics; injury to surrounding organs which may involve bowel, bladder, ureters ; need for additional procedures including laparoscopy or laparotomy; thromboembolic phenomenon, surgical site problems and other postoperative/anesthesia complications. Likelihood of success in alleviating the patient's condition was discussed. Routine postoperative instructions will be reviewed with the patient and her family in detail after surgery.  The patient  concurred with the proposed plan, giving informed written consent for the surgery.  Patient has been NPO since last night she will remain NPO for procedure.  Anesthesia and OR aware.  Preoperative prophylactic antibiotics and SCDs ordered on call to the OR.  To OR when ready.  Skylynn Burkley L. Ihor Dow, M.D., [facility] 02/24/2017 9:33 AM

## 2017-02-25 ENCOUNTER — Encounter (HOSPITAL_COMMUNITY): Payer: Self-pay | Admitting: Obstetrics & Gynecology

## 2017-03-09 ENCOUNTER — Encounter (HOSPITAL_COMMUNITY): Payer: Self-pay | Admitting: *Deleted

## 2017-03-09 ENCOUNTER — Inpatient Hospital Stay (HOSPITAL_COMMUNITY)
Admission: AD | Admit: 2017-03-09 | Discharge: 2017-03-09 | Disposition: A | Discharge status: Home / Self Care | Payer: Medicaid Other | Source: Ambulatory Visit | Attending: Family Medicine | Admitting: Family Medicine

## 2017-03-09 DIAGNOSIS — F17290 Nicotine dependence, other tobacco product, uncomplicated: Secondary | ICD-10-CM | POA: Diagnosis not present

## 2017-03-09 DIAGNOSIS — A5901 Trichomonal vulvovaginitis: Secondary | ICD-10-CM | POA: Insufficient documentation

## 2017-03-09 DIAGNOSIS — Z885 Allergy status to narcotic agent status: Secondary | ICD-10-CM | POA: Insufficient documentation

## 2017-03-09 DIAGNOSIS — R109 Unspecified abdominal pain: Secondary | ICD-10-CM | POA: Diagnosis present

## 2017-03-09 LAB — URINALYSIS, ROUTINE W REFLEX MICROSCOPIC
Bacteria, UA: NONE SEEN
Bilirubin Urine: NEGATIVE
GLUCOSE, UA: NEGATIVE mg/dL
Hgb urine dipstick: NEGATIVE
Ketones, ur: NEGATIVE mg/dL
Nitrite: NEGATIVE
PROTEIN: NEGATIVE mg/dL
SPECIFIC GRAVITY, URINE: 1.011 (ref 1.005–1.030)
pH: 6 (ref 5.0–8.0)

## 2017-03-09 LAB — POCT PREGNANCY, URINE: PREG TEST UR: NEGATIVE

## 2017-03-09 LAB — WET PREP, GENITAL
Sperm: NONE SEEN
Yeast Wet Prep HPF POC: NONE SEEN

## 2017-03-09 MED ORDER — LIDOCAINE HCL 2 % EX GEL: 1.0000 "application " | CUTANEOUS | 0 refills | Status: DC | PRN

## 2017-03-09 MED ORDER — LIDOCAINE HCL 2 % EX GEL
1.0000 "application " | Freq: Once | CUTANEOUS | Status: AC
  Administered 2017-03-09: 5 via TOPICAL
  Filled 2017-03-09: qty 5

## 2017-03-09 MED ORDER — FLUCONAZOLE 150 MG PO TABS: 150.0000 mg | ORAL_TABLET | Freq: Once | ORAL | 0 refills | Status: AC

## 2017-03-09 MED ORDER — METRONIDAZOLE 500 MG PO TABS
2000.0000 mg | ORAL_TABLET | Freq: Once | ORAL | Status: AC
  Administered 2017-03-09: 2000 mg via ORAL
  Filled 2017-03-09: qty 4

## 2017-03-09 NOTE — Discharge Instructions (Signed)

## 2017-03-09 NOTE — MAU Provider Note (Signed)
Chief Complaint: Vaginal Discharge and Abdominal Pain   First Provider Initiated Contact with Patient 03/09/17 1540      SUBJECTIVE HPI: Beth Gibson is a 31 y.o. [id number] who presents to maternity admissions reporting onset of severe vaginal itching, burning, and vulvar swelling today. She reports some vaginal discharge that is sometimes thick and white and sometimes clear and sticky.  She has not tried any treatments. There is no pain other than slight vulvar burning.  There is no dysuria.  She had a D&C on 02/24/17 and reports she did not have intercourse but "started to and then stopped" 4 days ago. Last intercourse prior was in January.  There are no associated symptoms. She denies vaginal bleeding, urinary symptoms, h/a, dizziness, n/v, or fever/chills.     HPI  Past Medical History:  Diagnosis Date  . Abscess of axilla, right   . Anemia   . Asthma    NO MEDS  . BV (bacterial vaginosis)   . Chlamydia   . Headache(784.0)    MIGRAINES  . Preterm labor   . Trichomonas   . UTI (lower urinary tract infection)   . Yeast vaginitis    Past Surgical History:  Procedure Laterality Date  . CESAREAN SECTION    . CESAREAN SECTION  04/18/2012   Procedure: CESAREAN SECTION;  Surgeon: Guss Bunde, MD;  Location: [facility];  Service: Gynecology;  Laterality: N/A;  repeat  . cyst removed from L buttock    . CYSTECTOMY  2009   left buttock  . DILATION AND EVACUATION N/A 02/24/2017   Procedure: DILATATION AND EVACUATION;  Surgeon: Lavonia Drafts, MD;  Location: [facility];  Service: Gynecology;  Laterality: N/A;  . INCISION AND DRAINAGE ABSCESS Right 11/01/2014   Procedure: INCISION AND DRAINAGE RIGHT AXILLARY ABSCESS;  Surgeon: Leighton Ruff, MD;  Location: [facility];  Service: General;  Laterality: Right;  . IRRIGATION AND DEBRIDEMENT ABSCESS Right 08/09/2015   Procedure: IRRIGATION AND DEBRIDEMENT AXILLARY ABSCESS;  Surgeon: Alphonsa Overall, MD;  Location: [facility];  Service: General;   Laterality: Right;  . WISDOM TOOTH EXTRACTION    . WISDOM TOOTH EXTRACTION  2009   Social History   Social History  . Marital status: Single    Spouse name: N/A  . Number of children: N/A  . Years of education: N/A   Occupational History  . Not on file.   Social History Main Topics  . Smoking status: Current Some Day Smoker    Packs/day: 0.25    Years: 0.50    Types: Cigars, Cigarettes  . Smokeless tobacco: Never Used  . Alcohol use No  . Drug use: Yes    Frequency: 3.0 times per week    Types: Marijuana  . Sexual activity: Yes    Birth control/ protection: Pill     Comment: Taking pill "somtimes", last taken 2 weeks ago   Other Topics Concern  . Not on file   Social History Narrative  . No narrative on file   No current facility-administered medications on file prior to encounter.    Current Outpatient Prescriptions on File Prior to Encounter  Medication Sig Dispense Refill  . acetaminophen (TYLENOL) 500 MG tablet Take 500 mg by mouth every 6 (six) hours as needed for moderate pain.    . ferrous sulfate (FERROUSUL) 325 (65 FE) MG tablet Take 1 tablet (325 mg total) by mouth 2 (two) times daily with a meal. 60 tablet 8  . doxycycline (VIBRAMYCIN) 100 MG capsule Take  1 capsule (100 mg total) by mouth 2 (two) times daily. 10 capsule 0  . ibuprofen (ADVIL,MOTRIN) 600 MG tablet Take 1 tablet (600 mg total) by mouth every 6 (six) hours as needed. 30 tablet 0   Allergies  Allergen Reactions  . Vicodin [Hydrocodone-Acetaminophen] Itching    ROS:  Review of Systems  Constitutional: Negative for chills, fatigue and fever.  Respiratory: Negative for shortness of breath.   Cardiovascular: Negative for chest pain.  Gastrointestinal: Negative for nausea and vomiting.  Genitourinary: Positive for vaginal discharge and vaginal pain. Negative for difficulty urinating, dysuria, flank pain, pelvic pain and vaginal bleeding.  Neurological: Negative for dizziness and headaches.   Psychiatric/Behavioral: Negative.      I have reviewed patient's Past Medical Hx, Surgical Hx, Family Hx, Social Hx, medications and allergies.   Physical Exam   Patient Vitals for the past 24 hrs:  BP Temp Temp src Pulse Resp Weight  03/09/17 1627 (!) 143/84 - - 77 18 -  03/09/17 1411 128/86 97.9 F (36.6 C) Oral 87 18 208 lb (94.3 kg)   Constitutional: Well-developed, well-nourished female in no acute distress.  Cardiovascular: normal rate Respiratory: normal effort GI: Abd soft, non-tender. Pos BS x 4 MS: Extremities nontender, no edema, normal ROM Neurologic: Alert and oriented x 4.  GU: Neg CVAT.  PELVIC EXAM: wet prep and GCC collected by blind swab   LAB RESULTS Results for orders placed or performed during the hospital encounter of 03/09/17 (from the past 24 hour(s))  Urinalysis, Routine w reflex microscopic     Status: Abnormal   Collection Time: 03/09/17  2:16 PM  Result Value Ref Range   Color, Urine YELLOW YELLOW   APPearance CLEAR CLEAR   Specific Gravity, Urine 1.011 1.005 - 1.030   pH 6.0 5.0 - 8.0   Glucose, UA NEGATIVE NEGATIVE mg/dL   Hgb urine dipstick NEGATIVE NEGATIVE   Bilirubin Urine NEGATIVE NEGATIVE   Ketones, ur NEGATIVE NEGATIVE mg/dL   Protein, ur NEGATIVE NEGATIVE mg/dL   Nitrite NEGATIVE NEGATIVE   Leukocytes, UA SMALL (A) NEGATIVE   RBC / HPF 0-5 0 - 5 RBC/hpf   WBC, UA 0-5 0 - 5 WBC/hpf   Bacteria, UA NONE SEEN NONE SEEN   Squamous Epithelial / LPF 0-5 (A) NONE SEEN   Mucous PRESENT   Pregnancy, urine POC     Status: None   Collection Time: 03/09/17  2:44 PM  Result Value Ref Range   Preg Test, Ur NEGATIVE NEGATIVE  Wet prep, genital     Status: Abnormal   Collection Time: 03/09/17  3:20 PM  Result Value Ref Range   Yeast Wet Prep HPF POC NONE SEEN NONE SEEN   Trich, Wet Prep PRESENT (A) NONE SEEN   Clue Cells Wet Prep HPF POC PRESENT (A) NONE SEEN   WBC, Wet Prep HPF POC MODERATE (A) NONE SEEN   Sperm NONE SEEN     --/--/B  POS (03/07 2993)  IMAGING   MAU Management/MDM: Ordered labs and reviewed results.  Wet prep positive for trichomonas.  Will treat in MAU with Flagyl 2 g x 1 dose.  Rx written for Peyton Najjar for Flagyl 2 g x 1 dose for expedited partner therapy.  Printed materials given to pt and to share with partner.  Rx for Diflucan 150 mg x 1 dose to prevent/treat any yeast infection caused by abx.  Rx for lidocaine jelly 2 % topical, first dose given in MAU. Pt stable at time  of discharge.  ASSESSMENT 1. Trichomonas vaginalis (TV) infection     PLAN Discharge home  Allergies as of 03/09/2017      Reactions   Vicodin [hydrocodone-acetaminophen] Itching      Medication List    TAKE these medications   acetaminophen 500 MG tablet Commonly known as:  TYLENOL Take 500 mg by mouth every 6 (six) hours as needed for moderate pain.   doxycycline 100 MG capsule Commonly known as:  VIBRAMYCIN Take 1 capsule (100 mg total) by mouth 2 (two) times daily.   ferrous sulfate 325 (65 FE) MG tablet Commonly known as:  FERROUSUL Take 1 tablet (325 mg total) by mouth 2 (two) times daily with a meal.   fluconazole 150 MG tablet Commonly known as:  DIFLUCAN Take 1 tablet (150 mg total) by mouth once.   ibuprofen 600 MG tablet Commonly known as:  ADVIL,MOTRIN Take 1 tablet (600 mg total) by mouth every 6 (six) hours as needed.   lidocaine 2 % jelly Commonly known as:  XYLOCAINE Apply 1 application topically as needed.      Follow-up [facility] for [facility] Follow up.   Specialty:  Obstetrics and Gynecology Why:  As needed for gyn care, return to MAU as needed for emergencies. Contact information: [address] [place] Certified Nurse-Midwife 03/09/2017  5:03 PM

## 2017-03-09 NOTE — MAU Note (Signed)
Pt has had discharge for the last 3 days, is very irritated, itching swollen.  Discharge is thick, creamy, also clear @ times.  Having vaginal & abdominal pain, no bleeding.

## 2017-03-09 NOTE — Progress Notes (Signed)
Fatima Blank CNM in to discuss test results and d/c plan. Written and verbal d/c instructions given and understanding voiced.

## 2017-03-10 ENCOUNTER — Emergency Department (HOSPITAL_COMMUNITY)
Admission: EM | Admit: 2017-03-10 | Discharge: 2017-03-10 | Disposition: A | Discharge status: Home / Self Care | Payer: No Typology Code available for payment source | Attending: Emergency Medicine | Admitting: Emergency Medicine

## 2017-03-10 ENCOUNTER — Encounter (HOSPITAL_COMMUNITY): Payer: Self-pay | Admitting: Emergency Medicine

## 2017-03-10 DIAGNOSIS — M25512 Pain in left shoulder: Secondary | ICD-10-CM | POA: Insufficient documentation

## 2017-03-10 DIAGNOSIS — Y92410 Unspecified street and highway as the place of occurrence of the external cause: Secondary | ICD-10-CM | POA: Diagnosis not present

## 2017-03-10 DIAGNOSIS — Y999 Unspecified external cause status: Secondary | ICD-10-CM | POA: Diagnosis not present

## 2017-03-10 DIAGNOSIS — Z79899 Other long term (current) drug therapy: Secondary | ICD-10-CM | POA: Insufficient documentation

## 2017-03-10 DIAGNOSIS — F17210 Nicotine dependence, cigarettes, uncomplicated: Secondary | ICD-10-CM | POA: Insufficient documentation

## 2017-03-10 DIAGNOSIS — M546 Pain in thoracic spine: Secondary | ICD-10-CM | POA: Insufficient documentation

## 2017-03-10 DIAGNOSIS — J45909 Unspecified asthma, uncomplicated: Secondary | ICD-10-CM | POA: Diagnosis not present

## 2017-03-10 DIAGNOSIS — Y939 Activity, unspecified: Secondary | ICD-10-CM | POA: Diagnosis not present

## 2017-03-10 DIAGNOSIS — M25561 Pain in right knee: Secondary | ICD-10-CM | POA: Insufficient documentation

## 2017-03-10 LAB — GC/CHLAMYDIA PROBE AMP (~~LOC~~) NOT AT ARMC
CHLAMYDIA, DNA PROBE: NEGATIVE
NEISSERIA GONORRHEA: NEGATIVE

## 2017-03-10 MED ORDER — IBUPROFEN 600 MG PO TABS: 600.0000 mg | ORAL_TABLET | Freq: Four times a day (QID) | ORAL | 0 refills | Status: DC | PRN

## 2017-03-10 MED ORDER — CYCLOBENZAPRINE HCL 10 MG PO TABS: 10.0000 mg | ORAL_TABLET | Freq: Two times a day (BID) | ORAL | 0 refills | Status: DC | PRN
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
## 2017-03-10 NOTE — ED Notes (Signed)
Patient was alert, oriented and stable upon discharge. RN went over AVS and patient had no further questions.  
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
## 2017-03-10 NOTE — ED Notes (Signed)
ED Provider at bedside. 

## 2017-03-25 ENCOUNTER — Ambulatory Visit: Payer: Self-pay | Admitting: Obstetrics and Gynecology

## 2017-03-25 ENCOUNTER — Encounter: Payer: Self-pay | Admitting: Obstetrics and Gynecology

## 2017-03-25 NOTE — Progress Notes (Signed)
Patient did not keep GYN appointment for 03/25/2017.  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

## 2017-04-29 ENCOUNTER — Emergency Department (HOSPITAL_COMMUNITY)
Admission: EM | Admit: 2017-04-29 | Discharge: 2017-04-29 | Disposition: A | Discharge status: Home / Self Care | Payer: Medicaid Other | Attending: Emergency Medicine | Admitting: Emergency Medicine

## 2017-04-29 ENCOUNTER — Encounter (HOSPITAL_COMMUNITY): Payer: Self-pay | Admitting: Emergency Medicine

## 2017-04-29 DIAGNOSIS — T300 Burn of unspecified body region, unspecified degree: Secondary | ICD-10-CM

## 2017-04-29 DIAGNOSIS — Z79899 Other long term (current) drug therapy: Secondary | ICD-10-CM | POA: Diagnosis not present

## 2017-04-29 DIAGNOSIS — Y999 Unspecified external cause status: Secondary | ICD-10-CM | POA: Insufficient documentation

## 2017-04-29 DIAGNOSIS — T2016XA Burn of first degree of forehead and cheek, initial encounter: Secondary | ICD-10-CM | POA: Diagnosis not present

## 2017-04-29 DIAGNOSIS — T22111A Burn of first degree of right forearm, initial encounter: Secondary | ICD-10-CM | POA: Insufficient documentation

## 2017-04-29 DIAGNOSIS — T22112A Burn of first degree of left forearm, initial encounter: Secondary | ICD-10-CM | POA: Insufficient documentation

## 2017-04-29 DIAGNOSIS — X19XXXA Contact with other heat and hot substances, initial encounter: Secondary | ICD-10-CM | POA: Insufficient documentation

## 2017-04-29 DIAGNOSIS — J45909 Unspecified asthma, uncomplicated: Secondary | ICD-10-CM | POA: Diagnosis not present

## 2017-04-29 DIAGNOSIS — Y939 Activity, unspecified: Secondary | ICD-10-CM | POA: Diagnosis not present

## 2017-04-29 DIAGNOSIS — T22011A Burn of unspecified degree of right forearm, initial encounter: Secondary | ICD-10-CM | POA: Diagnosis present

## 2017-04-29 DIAGNOSIS — F17290 Nicotine dependence, other tobacco product, uncomplicated: Secondary | ICD-10-CM | POA: Insufficient documentation

## 2017-04-29 DIAGNOSIS — Y929 Unspecified place or not applicable: Secondary | ICD-10-CM | POA: Insufficient documentation

## 2017-04-29 MED ORDER — OXYCODONE-ACETAMINOPHEN 5-325 MG PO TABS: 1.0000 | ORAL_TABLET | ORAL | 0 refills | Status: DC | PRN

## 2017-04-29 MED ORDER — DIPHENHYDRAMINE HCL 50 MG/ML IJ SOLN
25.0000 mg | Freq: Once | INTRAMUSCULAR | Status: AC
  Administered 2017-04-29: 25 mg via INTRAMUSCULAR
  Filled 2017-04-29: qty 1

## 2017-04-29 MED ORDER — NAPROXEN 500 MG PO TABS: 500.0000 mg | ORAL_TABLET | Freq: Two times a day (BID) | ORAL | 0 refills | Status: DC

## 2017-04-29 MED ORDER — KETOROLAC TROMETHAMINE 30 MG/ML IJ SOLN
30.0000 mg | Freq: Once | INTRAMUSCULAR | Status: AC
  Administered 2017-04-29: 30 mg via INTRAMUSCULAR
  Filled 2017-04-29: qty 1

## 2017-04-29 MED ORDER — HYDROMORPHONE HCL 1 MG/ML IJ SOLN
1.0000 mg | Freq: Once | INTRAMUSCULAR | Status: AC
  Administered 2017-04-29: 1 mg via INTRAMUSCULAR
  Filled 2017-04-29: qty 1

## 2017-04-29 MED ORDER — SILVER SULFADIAZINE 1 % EX CREA
TOPICAL_CREAM | CUTANEOUS | Status: AC
  Filled 2017-04-29: qty 85
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
## 2017-05-04 ENCOUNTER — Encounter: Payer: Medicaid Other | Attending: Internal Medicine | Admitting: Internal Medicine

## 2017-05-04 DIAGNOSIS — J45909 Unspecified asthma, uncomplicated: Secondary | ICD-10-CM | POA: Diagnosis not present

## 2017-05-04 DIAGNOSIS — X12XXXD Contact with other hot fluids, subsequent encounter: Secondary | ICD-10-CM | POA: Insufficient documentation

## 2017-05-04 DIAGNOSIS — T22231D Burn of second degree of right upper arm, subsequent encounter: Secondary | ICD-10-CM | POA: Diagnosis present

## 2017-05-04 DIAGNOSIS — Z885 Allergy status to narcotic agent status: Secondary | ICD-10-CM | POA: Diagnosis not present

## 2017-05-04 DIAGNOSIS — F17210 Nicotine dependence, cigarettes, uncomplicated: Secondary | ICD-10-CM | POA: Insufficient documentation

## 2017-05-04 DIAGNOSIS — T2010XD Burn of first degree of head, face, and neck, unspecified site, subsequent encounter: Secondary | ICD-10-CM | POA: Diagnosis not present

## 2017-05-05 NOTE — Progress Notes (Signed)
Beth Gibson ([id number]) Visit Report for 05/04/2017 Allergy List Details Patient Name: Beth Gibson. Date of Service: 05/04/2017 10:30 AM Medical Record Number: [id number] Patient Account Number: [id number] Date of Birth/Sex: Feb 02, 1986 (31 y.o. Female) Treating RN: Carolyne Fiscal, Debi Primary Care Evamarie Raetz: SYSTEM, Kasra Melvin Other Clinician: Referring Eyana Stolze: Lacretia Leigh Treating Malcom Selmer/Extender: Ricard Dillon Weeks in Treatment: 0 Allergies Active Allergies Vicodin Allergy Notes Electronic Signature(s) Signed: 05/04/2017 4:34:49 PM By: Alric Quan Entered By: Alric Quan on 05/04/2017 10:45:09 Beth Gibson ([id number]) -------------------------------------------------------------------------------- Arrival Information Details Patient Name: Beth Gibson Date of Service: 05/04/2017 10:30 AM Medical Record Number: [id number] Patient Account Number: [id number] Date of Birth/Sex: 02-17-1986 (31 y.o. Female) Treating RN: Carolyne Fiscal, Debi Primary Care Terryn Redner: SYSTEM, Henna Derderian Other Clinician: Referring Latrecia Capito: Lacretia Leigh Treating Praveen Coia/Extender: Tito Dine in Treatment: 0 Visit Information Patient Arrived: Ambulatory Arrival Time: 10:37 Accompanied By: self Transfer Assistance: None Patient Identification Verified: Yes Secondary Verification Process Yes Completed: Patient Requires Transmission-Based No Precautions: Patient Has Alerts: No Electronic Signature(s) Signed: 05/04/2017 4:34:49 PM By: Alric Quan Entered By: Alric Quan on 05/04/2017 10:38:39 Beth Gibson ([id number]) -------------------------------------------------------------------------------- Clinic Level of Care Assessment Details Patient Name: Beth Gibson Date of Service: 05/04/2017 10:30 AM Medical Record Number: [id number] Patient Account Number: [id number] Date of Birth/Sex: 15-Oct-1986 (31 y.o. Female) Treating RN: Carolyne Fiscal, Debi Primary Care  Jobani Sabado: SYSTEM, Adrik Khim Other Clinician: Referring Joycelynn Fritsche: Lacretia Leigh Treating Standley Bargo/Extender: Tito Dine in Treatment: 0 Clinic Level of Care Assessment Items TOOL 2 Quantity Score X - Use when only an EandM is performed on the INITIAL visit 1 0 ASSESSMENTS - Nursing Assessment / Reassessment X - General Physical Exam (combine w/ comprehensive assessment (listed just 1 20 below) when performed on new pt. evals) X - Comprehensive Assessment (HX, ROS, Risk Assessments, Wounds Hx, etc.) 1 25 ASSESSMENTS - Wound and Skin Assessment / Reassessment X - Simple Wound Assessment / Reassessment - one wound 1 5 []  - Complex Wound Assessment / Reassessment - multiple wounds 0 []  - Dermatologic / Skin Assessment (not related to wound area) 0 ASSESSMENTS - Ostomy and/or Continence Assessment and Care []  - Incontinence Assessment and Management 0 []  - Ostomy Care Assessment and Management (repouching, etc.) 0 PROCESS - Coordination of Care []  - Simple Patient / Family Education for ongoing care 0 X - Complex (extensive) Patient / Family Education for ongoing care 1 20 X - Staff obtains Programmer, systems, Records, Test Results / Process Orders 1 10 []  - Staff telephones HHA, Nursing Homes / Clarify orders / etc 0 []  - Routine Transfer to another Facility (non-emergent condition) 0 []  - Routine Hospital Admission (non-emergent condition) 0 X - New Admissions / Biomedical engineer / Ordering NPWT, Apligraf, etc. 1 15 []  - Emergency Hospital Admission (emergent condition) 0 X - Simple Discharge Coordination 1 10 [name], [name] ([id number]) []  - Complex (extensive) Discharge Coordination 0 PROCESS - Special Needs []  - Pediatric / Minor Patient Management 0 []  - Isolation Patient Management 0 []  - Hearing / Language / Visual special needs 0 []  - Assessment of Community assistance (transportation, D/C planning, etc.) 0 []  - Additional assistance / Altered mentation 0 []  -  Support Surface(s) Assessment (bed, cushion, seat, etc.) 0 INTERVENTIONS - Wound Cleansing / Measurement X - Wound Imaging (photographs - any number of wounds) 1 5 []  - Wound Tracing (instead of photographs) 0 X - Simple Wound Measurement - one wound 1 5 []  - Complex Wound Measurement - multiple wounds 0 []  -  Simple Wound Cleansing - one wound 0 X - Complex Wound Cleansing - multiple wounds 1 5 INTERVENTIONS - Wound Dressings []  - Small Wound Dressing one or multiple wounds 0 X - Medium Wound Dressing one or multiple wounds 1 15 []  - Large Wound Dressing one or multiple wounds 0 []  - Application of Medications - injection 0 INTERVENTIONS - Miscellaneous []  - External ear exam 0 []  - Specimen Collection (cultures, biopsies, blood, body fluids, etc.) 0 []  - Specimen(s) / Culture(s) sent or taken to Lab for analysis 0 []  - Patient Transfer (multiple staff / Civil Service fast streamer / Similar devices) 0 []  - Simple Staple / Suture removal (25 or less) 0 []  - Complex Staple / Suture removal (26 or more) 0 [name], [name] ([id number]) []  - Hypo / Hyperglycemic Management (close monitor of Blood Glucose) 0 []  - Ankle / Brachial Index (ABI) - do not check if billed separately 0 Has the patient been seen at the hospital within the last three years: Yes Total Score: 135 Level Of Care: New/Established - Level 4 Electronic Signature(s) Signed: 05/04/2017 4:34:49 PM By: Alric Quan Entered By: Alric Quan on 05/04/2017 11:30:16 Beth Gibson ([id number]) -------------------------------------------------------------------------------- Encounter Discharge Information Details Patient Name: Beth Gibson Date of Service: 05/04/2017 10:30 AM Medical Record Number: [id number] Patient Account Number: [id number] Date of Birth/Sex: 1986-04-21 (31 y.o. Female) Treating RN: Carolyne Fiscal, Debi Primary Care Braylei Totino: SYSTEM, Keary Waterson Other Clinician: Referring Prabhnoor Ellenberger: Lacretia Leigh Treating Daly Whipkey/Extender:  Tito Dine in Treatment: 0 Encounter Discharge Information Items Discharge Pain Level: 10 Discharge Condition: Stable Ambulatory Status: Ambulatory Discharge Destination: Home Transportation: Private Auto Accompanied By: self Schedule Follow-up Appointment: Yes Medication Reconciliation completed and provided to Patient/Care No Gerasimos Plotts: Provided on Clinical Summary of Care: 05/04/2017 Form Type Recipient Paper Patient NH Electronic Signature(s) Signed: 05/04/2017 4:34:49 PM By: Alric Quan Previous Signature: 05/04/2017 11:15:10 AM Version By: Ruthine Dose Entered By: Alric Quan on 05/04/2017 11:27:40 Malicki, Arnette Delane Ginger ([id number]) -------------------------------------------------------------------------------- Lower Extremity Assessment Details Patient Name: Beth Gibson Date of Service: 05/04/2017 10:30 AM Medical Record Number: [id number] Patient Account Number: [id number] Date of Birth/Sex: 12-Feb-1986 (31 y.o. Female) Treating RN: Carolyne Fiscal, Debi Primary Care Basya Casavant: SYSTEM, Sharonlee Nine Other Clinician: Referring Miyonna Ormiston: Lacretia Leigh Treating Maahir Horst/Extender: Ricard Dillon Weeks in Treatment: 0 Edema Assessment Assessed: [Left: Yes] [Right: Yes] E[Left: dema] [Right: :] Electronic Signature(s) Signed: 05/04/2017 4:34:49 PM By: Alric Quan Entered By: Alric Quan on 05/04/2017 10:44:56 Swiney, Loreal Delane Ginger ([id number]) -------------------------------------------------------------------------------- Multi Wound Chart Details Patient Name: Beth Gibson Date of Service: 05/04/2017 10:30 AM Medical Record Number: [id number] Patient Account Number: [id number] Date of Birth/Sex: 10-17-1986 (31 y.o. Female) Treating RN: Carolyne Fiscal, Debi Primary Care Emilian Stawicki: SYSTEM, Joseguadalupe Stan Other Clinician: Referring Amrita Radu: Lacretia Leigh Treating Hanifa Antonetti/Extender: Ricard Dillon Weeks in Treatment: 0 Vital Signs Height(in): 68 Pulse(bpm):  92 Weight(lbs): 208.1 Blood Pressure 135/94 (mmHg): Body Mass Index(BMI): 32 Temperature(F): 98.0 Respiratory Rate 20 (breaths/min): Photos: [N/A:N/A] Wound Location: Right Forearm - N/A N/A Circumfernential Wounding Event: Thermal Burn N/A N/A Primary Etiology: 2nd degree Burn N/A N/A Date Acquired: 04/29/2017 N/A N/A Weeks of Treatment: 0 N/A N/A Wound Status: Open N/A N/A Measurements L x W x D 17x18x0.1 N/A N/A (cm) Area (cm) : 240.332 N/A N/A Volume (cm) : 24.033 N/A N/A Classification: Partial Thickness N/A N/A Exudate Amount: Large N/A N/A Exudate Type: Serous N/A N/A Exudate Color: amber N/A N/A Wound Margin: Distinct, outline attached N/A N/A Granulation Amount: None Present (0%) N/A N/A Necrotic Amount: Large (67-100%)  N/A N/A Necrotic Tissue: Eschar N/A N/A Epithelialization: None N/A N/A Periwound Skin Texture: No Abnormalities Noted N/A N/A Periwound Skin No Abnormalities Noted N/A N/A Moisture: Alonge, Criselda N. ([id number]) Periwound Skin Color: No Abnormalities Noted N/A N/A Temperature: No Abnormality N/A N/A Tenderness on Yes N/A N/A Palpation: Wound Preparation: Ulcer Cleansing: N/A N/A Rinsed/Irrigated with Saline Treatment Notes Wound #1 (Right, Circumferential Forearm) 1. Cleansed with: Clean wound with Normal Saline 4. Dressing Applied: Silvadene Cream 5. Secondary Dressing Applied Kerlix/Conform Non-Adherent pad 7. Secured with Tape Notes netting Electronic Signature(s) Signed: 05/04/2017 4:47:06 PM By: Linton Ham MD Entered By: Linton Ham on 05/04/2017 12:23:56 Beth Gibson ([id number]) -------------------------------------------------------------------------------- Multi-Disciplinary Care Plan Details Patient Name: Beth Gibson Date of Service: 05/04/2017 10:30 AM Medical Record Number: [id number] Patient Account Number: [id number] Date of Birth/Sex: [birth year] (31 y.o. Female) Treating RN: Carolyne Fiscal, Debi Primary  Care Rhen Kawecki: SYSTEM, Kiran Lapine Other Clinician: Referring Keeana Pieratt: Lacretia Leigh Treating Minoru Chap/Extender: Tito Dine in Treatment: 0 Active Inactive ` Nutrition Nursing Diagnoses: Imbalanced nutrition Goals: Patient/caregiver agrees to and verbalizes understanding of need to use nutritional supplements and/or vitamins as prescribed Date Initiated: 05/04/2017 Target Resolution Date: 07/24/2017 Goal Status: Active Interventions: Assess patient nutrition upon admission and as needed per policy Notes: ` Orientation to the Wound Care Program Nursing Diagnoses: Knowledge deficit related to the wound healing center program Goals: Patient/caregiver will verbalize understanding of the [place] Program Date Initiated: 05/04/2017 Target Resolution Date: 08/28/2017 Goal Status: Active Interventions: Provide education on orientation to the wound center Notes: ` Pain, Acute or Chronic Nursing Diagnoses: Pain, acute or chronic: actual or potential Allbritton, Ricquel N. ([id number]) Potential alteration in comfort, pain Goals: Patient/caregiver will verbalize adequate pain control between visits Date Initiated: 05/04/2017 Target Resolution Date: 08/21/2017 Goal Status: Active Interventions: Assess comfort goal upon admission Notes: ` Wound/Skin Impairment Nursing Diagnoses: Impaired tissue integrity Knowledge deficit related to smoking impact on wound healing Knowledge deficit related to ulceration/compromised skin integrity Goals: Ulcer/skin breakdown will have a volume reduction of 80% by week 12 Date Initiated: 05/04/2017 Target Resolution Date: 08/28/2017 Goal Status: Active Interventions: Assess patient/caregiver ability to perform ulcer/skin care regimen upon admission and as needed Notes: Electronic Signature(s) Signed: 05/04/2017 4:34:49 PM By: Alric Quan Entered By: Alric Quan on 05/04/2017 10:59:04 Beth Gibson  ([id number]) -------------------------------------------------------------------------------- Pain Assessment Details Patient Name: Beth Gibson Date of Service: 05/04/2017 10:30 AM Medical Record Number: [id number] Patient Account Number: [id number] Date of Birth/Sex: 1986/12/07 (31 y.o. Female) Treating RN: Carolyne Fiscal, Debi Primary Care Lillan Mccreadie: SYSTEM, Teofilo Lupinacci Other Clinician: Referring Trew Sunde: Lacretia Leigh Treating Crytal Pensinger/Extender: Ricard Dillon Weeks in Treatment: 0 Active Problems Location of Pain Severity and Description of Pain Patient Has Paino Yes Site Locations Pain Location: Generalized Pain With Dressing Change: Yes Duration of the Pain. Constant / Intermittento Constant Rate the pain. Current Pain Level: 10 Worst Pain Level: 10 Least Pain Level: 3 Character of Pain Describe the Pain: Burning Pain Management and Medication Current Pain Management: Electronic Signature(s) Signed: 05/04/2017 4:34:49 PM By: Alric Quan Entered By: Alric Quan on 05/04/2017 10:39:23 Beth Gibson ([id number]) -------------------------------------------------------------------------------- Patient/Caregiver Education Details Patient Name: Beth Gibson Date of Service: 05/04/2017 10:30 AM Medical Record Patient Account Number: [id number] Number: Treating RN: Ahmed Prima [birth year] (31 y.o. Other Clinician: Date of Birth/Gender: Female) Treating ROBSON, MICHAEL Primary Care Physician: SYSTEM, Daniyah Fohl Physician/Extender: G Referring Physician: Antony Salmon in Treatment: 0 Education Assessment Education Provided To: Patient Education Topics Provided Welcome To The Wound Care  Center: Handouts: Welcome To The Experiment Methods: Explain/Verbal Responses: State content correctly Wound/Skin Impairment: Handouts: Other: change dressing as ordered Methods: Demonstration, Explain/Verbal Responses: State content  correctly Electronic Signature(s) Signed: 05/04/2017 4:34:49 PM By: Alric Quan Entered By: Alric Quan on 05/04/2017 11:27:54 Font, Ruble Delane Ginger ([id number]) -------------------------------------------------------------------------------- Wound Assessment Details Patient Name: Beth Gibson. Date of Service: 05/04/2017 10:30 AM Medical Record Number: [id number] Patient Account Number: [id number] Date of Birth/Sex: 1986-06-07 (31 y.o. Female) Treating RN: Carolyne Fiscal, Debi Primary Care Shadd Dunstan: SYSTEM, Jadyn Barge Other Clinician: Referring Isham Smitherman: Lacretia Leigh Treating Samaj Wessells/Extender: Ricard Dillon Weeks in Treatment: 0 Wound Status Wound Number: 1 Primary Etiology: 2nd degree Burn Wound Location: Right Forearm - Wound Status: Open Circumfernential Wounding Event: Thermal Burn Date Acquired: 04/29/2017 Weeks Of Treatment: 0 Clustered Wound: No Photos Photo Uploaded By: Alric Quan on 05/04/2017 11:31:22 Wound Measurements Length: (cm) 17 Width: (cm) 18 Depth: (cm) 0.1 Area: (cm) 240.332 Volume: (cm) 24.033 % Reduction in Area: % Reduction in Volume: Epithelialization: None Tunneling: No Undermining: No Wound Description Classification: Partial Thickness Foul Odor Afte Wound Margin: Distinct, outline attached Slough/Fibrino Exudate Amount: Large Exudate Type: Serous Exudate Color: amber r Cleansing: No No Wound Bed Granulation Amount: None Present (0%) Necrotic Amount: Large (67-100%) Necrotic Quality: Eschar Inglis, Kyannah N. ([id number]) Periwound Skin Texture Texture Color No Abnormalities Noted: No No Abnormalities Noted: No Moisture Temperature / Pain No Abnormalities Noted: No Temperature: No Abnormality Tenderness on Palpation: Yes Wound Preparation Ulcer Cleansing: Rinsed/Irrigated with Saline Electronic Signature(s) Signed: 05/04/2017 4:34:49 PM By: Alric Quan Entered By: Alric Quan on 05/04/2017 10:44:47 Etherington, Arianny  Delane Ginger ([id number]) -------------------------------------------------------------------------------- Vitals Details Patient Name: Beth Gibson Date of Service: 05/04/2017 10:30 AM Medical Record Number: [id number] Patient Account Number: [id number] Date of Birth/Sex: 1986-01-10 (31 y.o. Female) Treating RN: Carolyne Fiscal, Debi Primary Care Elisea Khader: SYSTEM, Levii Hairfield Other Clinician: Referring Hazaiah Edgecombe: Lacretia Leigh Treating John Vasconcelos/Extender: Ricard Dillon Weeks in Treatment: 0 Vital Signs Time Taken: 10:39 Temperature (F): 98.0 Height (in): 68 Pulse (bpm): 92 Source: Stated Respiratory Rate (breaths/min): 20 Weight (lbs): 208.1 Blood Pressure (mmHg): 135/94 Source: Measured Reference Range: 80 - 120 mg / dl Body Mass Index (BMI): 31.6 Electronic Signature(s) Signed: 05/04/2017 4:34:49 PM By: Alric Quan Entered By: Alric Quan on 05/04/2017 10:42:13

## 2017-05-05 NOTE — Progress Notes (Signed)
IONA, STAY ([id number]) Visit Report for 05/04/2017 Abuse/Suicide Risk Screen Details Patient Name: Beth Gibson, AERTS. Date of Service: 05/04/2017 10:30 AM Medical Record Patient Account Number: [id number] Number: Treating RN: Ahmed Prima [birth year] (31 y.o. Other Clinician: Date of Birth/Sex: Female) Treating ROBSON, MICHAEL Primary Care Axxel Gude: SYSTEM, Hazeline Charnley Esbeidy Mclaine/Extender: G Referring Ranya Fiddler: Lacretia Leigh Weeks in Treatment: 0 Abuse/Suicide Risk Screen Items Answer ABUSE/SUICIDE RISK SCREEN: Has anyone close to you tried to hurt or harm you recentlyo No Do you feel uncomfortable with anyone in your familyo No Has anyone forced you do things that you didnot want to doo No Do you have any thoughts of harming yourselfo No Patient displays signs or symptoms of abuse and/or neglect. No Electronic Signature(s) Signed: 05/04/2017 4:34:49 PM By: Alric Quan Entered By: Alric Quan on 05/04/2017 10:47:16 Beth Gibson ([id number]) -------------------------------------------------------------------------------- Activities of Daily Living Details Patient Name: Beth Gibson. Date of Service: 05/04/2017 10:30 AM Medical Record Patient Account Number: [id number] Number: Treating RN: Ahmed Prima 1986-07-10 (31 y.o. Other Clinician: Date of Birth/Sex: Female) Treating ROBSON, MICHAEL Primary Care Colvin Blatt: SYSTEM, Kasey Hansell Irys Nigh/Extender: G Referring Kalilah Barua: Antony Salmon in Treatment: 0 Activities of Daily Living Items Answer Activities of Daily Living (Please select one for each item) Drive Automobile Completely Able Take Medications Completely Able Use Telephone Completely Able Care for Appearance Completely Able Use Toilet Completely Able Bath / Shower Completely Able Dress Self Completely Able Feed Self Completely Able Walk Completely Able Get In / Out Bed Completely Able Housework Completely Able Prepare Meals  Completely Gabbs for Self Completely Able Electronic Signature(s) Signed: 05/04/2017 4:34:49 PM By: Alric Quan Entered By: Alric Quan on 05/04/2017 10:47:35 Beth Gibson ([id number]) -------------------------------------------------------------------------------- Education Assessment Details Patient Name: Beth Gibson Date of Service: 05/04/2017 10:30 AM Medical Record Patient Account Number: [id number] Number: Treating RN: Ahmed Prima 06/12/1986 (31 y.o. Other Clinician: Date of Birth/Sex: Female) Treating ROBSON, MICHAEL Primary Care Xiong Haidar: SYSTEM, Juanice Warburton Shaquinta Peruski/Extender: G Referring Fabiola Mudgett: Antony Salmon in Treatment: 0 Primary Learner Assessed: Patient Learning Preferences/Education Level/Primary Language Learning Preference: Explanation, Printed Material Highest Education Level: High School Preferred Language: English Cognitive Barrier Assessment/Beliefs Language Barrier: No Translator Needed: No Memory Deficit: No Emotional Barrier: No Cultural/Religious Beliefs Affecting Medical No Care: Physical Barrier Assessment Impaired Vision: No Impaired Hearing: No Decreased Hand dexterity: No Knowledge/Comprehension Assessment Knowledge Level: Medium Comprehension Level: Medium Ability to understand written Medium instructions: Ability to understand verbal Medium instructions: Motivation Assessment Anxiety Level: Calm Cooperation: Cooperative Education Importance: Acknowledges Need Interest in Health Problems: Asks Questions Perception: Coherent Willingness to Engage in Self- Medium Management Activities: Readiness to Engage in Self- Medium Management Activities: Beth Gibson, Beth Gibson ([id number]) Electronic Signature(s) Signed: 05/04/2017 4:34:49 PM By: Alric Quan Entered By: Alric Quan on 05/04/2017 10:47:56 Beth Gibson  ([id number]) -------------------------------------------------------------------------------- Fall Risk Assessment Details Patient Name: Beth Gibson Date of Service: 05/04/2017 10:30 AM Medical Record Patient Account Number: [id number] Number: Treating RN: Ahmed Prima 1986/10/16 (31 y.o. Other Clinician: Date of Birth/Sex: Female) Treating ROBSON, MICHAEL Primary Care Zamantha Strebel: SYSTEM, Bekah Igoe Nathania Waldman/Extender: G Referring Ryley Teater: Antony Salmon in Treatment: 0 Fall Risk Assessment Items Have you had 2 or more falls in the last 12 monthso 0 No Have you had any fall that resulted in injury in the last 12 monthso 0 No FALL RISK ASSESSMENT: History of falling - immediate or within 3 months 0 No Secondary diagnosis 0 No Ambulatory aid None/bed rest/wheelchair/nurse 0 No  Crutches/cane/walker 0 No Furniture 0 No IV Access/Saline Lock 0 No Gait/Training Normal/bed rest/immobile 0 No Weak 0 No Impaired 0 No Mental Status Oriented to own ability 0 Yes Electronic Signature(s) Signed: 05/04/2017 4:34:49 PM By: Alric Quan Entered By: Alric Quan on 05/04/2017 10:48:02 Beth Gibson, Beth Gibson ([id number]) -------------------------------------------------------------------------------- Foot Assessment Details Patient Name: Beth Gibson. Date of Service: 05/04/2017 10:30 AM Medical Record Patient Account Number: [id number] Number: Treating RN: Ahmed Prima 1986-03-05 (31 y.o. Other Clinician: Date of Birth/Sex: Female) Treating ROBSON, MICHAEL Primary Care Kailer Heindel: SYSTEM, Na Waldrip Latwan Luchsinger/Extender: G Referring Olusegun Gerstenberger: Lacretia Leigh Weeks in Treatment: 0 Foot Assessment Items Site Locations + = Sensation present, - = Sensation absent, C = Callus, U = Ulcer R = Redness, W = Warmth, M = Maceration, PU = Pre-ulcerative lesion F = Fissure, S = Swelling, D = Dryness Assessment Right: Left: Other Deformity: No No Prior Foot Ulcer: No  No Prior Amputation: No No Charcot Joint: No No Ambulatory Status: Gait: Electronic Signature(s) Signed: 05/04/2017 4:34:49 PM By: Alric Quan Entered By: Alric Quan on 05/04/2017 10:48:59 Beth Gibson, Beth Gibson ([id number]) -------------------------------------------------------------------------------- Nutrition Risk Assessment Details Patient Name: Beth Gibson Date of Service: 05/04/2017 10:30 AM Medical Record Patient Account Number: [id number] Number: Treating RN: Ahmed Prima [birth year] (31 y.o. Other Clinician: Date of Birth/Sex: Female) Treating ROBSON, MICHAEL Primary Care Mariska Daffin: SYSTEM, Seila Liston Bethany Cumming/Extender: G Referring Biruk Troia: Lacretia Leigh Weeks in Treatment: 0 Height (in): 68 Weight (lbs): 208.1 Body Mass Index (BMI): 31.6 Nutrition Risk Assessment Items NUTRITION RISK SCREEN: I have an illness or condition that made me change the kind and/or 0 No amount of food I eat I eat fewer than two meals per day 0 No I eat few fruits and vegetables, or milk products 0 No I have three or more drinks of beer, liquor or wine almost every day 0 No I have tooth or mouth problems that make it hard for me to eat 0 No I don't always have enough money to buy the food I need 0 No I eat alone most of the time 0 No I take three or more different prescribed or over-the-counter drugs a 0 No day Without wanting to, I have lost or gained 10 pounds in the last six 0 No months I am not always physically able to shop, cook and/or feed myself 0 No Nutrition Protocols Good Risk Protocol 0 No interventions needed Moderate Risk Protocol Electronic Signature(s) Signed: 05/04/2017 4:34:49 PM By: Alric Quan Entered By: Alric Quan on 05/04/2017 10:48:49
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
## 2017-05-11 ENCOUNTER — Encounter: Payer: Medicaid Other | Admitting: Internal Medicine

## 2017-05-11 DIAGNOSIS — T22231D Burn of second degree of right upper arm, subsequent encounter: Secondary | ICD-10-CM | POA: Diagnosis not present
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
## 2017-07-23 ENCOUNTER — Encounter (HOSPITAL_COMMUNITY): Payer: Self-pay

## 2017-07-23 ENCOUNTER — Inpatient Hospital Stay (HOSPITAL_COMMUNITY)
Admission: AD | Admit: 2017-07-23 | Discharge: 2017-07-23 | Disposition: A | Discharge status: Home / Self Care | Payer: Medicaid Other | Source: Ambulatory Visit | Attending: Obstetrics & Gynecology | Admitting: Obstetrics & Gynecology

## 2017-07-23 DIAGNOSIS — Z833 Family history of diabetes mellitus: Secondary | ICD-10-CM | POA: Insufficient documentation

## 2017-07-23 DIAGNOSIS — R1031 Right lower quadrant pain: Secondary | ICD-10-CM | POA: Diagnosis not present

## 2017-07-23 DIAGNOSIS — N946 Dysmenorrhea, unspecified: Secondary | ICD-10-CM | POA: Diagnosis not present

## 2017-07-23 DIAGNOSIS — Z8249 Family history of ischemic heart disease and other diseases of the circulatory system: Secondary | ICD-10-CM | POA: Diagnosis not present

## 2017-07-23 DIAGNOSIS — F17290 Nicotine dependence, other tobacco product, uncomplicated: Secondary | ICD-10-CM | POA: Insufficient documentation

## 2017-07-23 DIAGNOSIS — Z79899 Other long term (current) drug therapy: Secondary | ICD-10-CM | POA: Diagnosis not present

## 2017-07-23 DIAGNOSIS — N921 Excessive and frequent menstruation with irregular cycle: Secondary | ICD-10-CM | POA: Diagnosis not present

## 2017-07-23 DIAGNOSIS — Z9889 Other specified postprocedural states: Secondary | ICD-10-CM | POA: Insufficient documentation

## 2017-07-23 DIAGNOSIS — J45909 Unspecified asthma, uncomplicated: Secondary | ICD-10-CM | POA: Diagnosis not present

## 2017-07-23 DIAGNOSIS — M545 Low back pain: Secondary | ICD-10-CM | POA: Diagnosis not present

## 2017-07-23 DIAGNOSIS — Z885 Allergy status to narcotic agent status: Secondary | ICD-10-CM | POA: Diagnosis not present

## 2017-07-23 DIAGNOSIS — N920 Excessive and frequent menstruation with regular cycle: Secondary | ICD-10-CM

## 2017-07-23 LAB — WET PREP, GENITAL
CLUE CELLS WET PREP: NONE SEEN
Sperm: NONE SEEN
TRICH WET PREP: NONE SEEN
Yeast Wet Prep HPF POC: NONE SEEN

## 2017-07-23 LAB — COMPREHENSIVE METABOLIC PANEL
ALK PHOS: 36 U/L — AB (ref 38–126)
ALT: 8 U/L — ABNORMAL LOW (ref 14–54)
ANION GAP: 6 (ref 5–15)
AST: 16 U/L (ref 15–41)
Albumin: 3.8 g/dL (ref 3.5–5.0)
BUN: 5 mg/dL — ABNORMAL LOW (ref 6–20)
CALCIUM: 8.7 mg/dL — AB (ref 8.9–10.3)
CO2: 23 mmol/L (ref 22–32)
Chloride: 103 mmol/L (ref 101–111)
Creatinine, Ser: 0.66 mg/dL (ref 0.44–1.00)
GFR calc non Af Amer: >60 mL/min (ref >60)
Glucose, Bld: 87 mg/dL (ref 65–99)
Potassium: 4 mmol/L (ref 3.5–5.1)
SODIUM: 132 mmol/L — AB (ref 135–145)
TOTAL PROTEIN: 7.8 g/dL (ref 6.5–8.1)
Total Bilirubin: 0.3 mg/dL (ref 0.3–1.2)

## 2017-07-23 LAB — URINALYSIS, MICROSCOPIC (REFLEX)

## 2017-07-23 LAB — CBC
HCT: 27 % — ABNORMAL LOW (ref 36.0–46.0)
Hemoglobin: 7.9 g/dL — ABNORMAL LOW (ref 12.0–15.0)
MCH: 17.2 pg — ABNORMAL LOW (ref 26.0–34.0)
MCHC: 29.3 g/dL — AB (ref 30.0–36.0)
MCV: 58.7 fL — ABNORMAL LOW (ref 78.0–100.0)
Platelets: 186 10*3/uL (ref 150–400)
RBC: 4.6 MIL/uL (ref 3.87–5.11)
RDW: 21 % — ABNORMAL HIGH (ref 11.5–15.5)
WBC: 3.7 10*3/uL — AB (ref 4.0–10.5)

## 2017-07-23 LAB — POCT PREGNANCY, URINE: PREG TEST UR: NEGATIVE

## 2017-07-23 LAB — URINALYSIS, ROUTINE W REFLEX MICROSCOPIC

## 2017-07-23 MED ORDER — KETOROLAC TROMETHAMINE 60 MG/2ML IM SOLN
60.0000 mg | Freq: Once | INTRAMUSCULAR | Status: AC
  Administered 2017-07-23: 60 mg via INTRAMUSCULAR
  Filled 2017-07-23: qty 2

## 2017-07-23 MED ORDER — MEDROXYPROGESTERONE ACETATE 10 MG PO TABS: 10.0000 mg | ORAL_TABLET | Freq: Every day | ORAL | 0 refills | Status: DC

## 2017-07-23 MED ORDER — OXYCODONE-ACETAMINOPHEN 5-325 MG PO TABS: 2.0000 | ORAL_TABLET | Freq: Four times a day (QID) | ORAL | 0 refills | Status: DC | PRN

## 2017-07-23 MED ORDER — FERROUS SULFATE 325 (65 FE) MG PO TBEC: 325.0000 mg | DELAYED_RELEASE_TABLET | Freq: Every day | ORAL | 3 refills | Status: DC

## 2017-07-23 NOTE — MAU Note (Signed)
Pt complains of heavy menstrual bleeding x 5 days and pain 10/10.

## 2017-07-23 NOTE — MAU Provider Note (Signed)
History     CSN: [id number]  Arrival date and time: 07/23/17 3810     Chief Complaint  Patient presents with  . Abdominal Pain  . Vaginal Bleeding   31 yo [id number] presenting with abdominal pain and heavy vaginal bleeding, and abdominal cramping  Pt is having 10/10 cramping abdominal pain and heavy bleeding for 5 days in duration. She reports sharp pain in the RLQ also, and lower back pain.  She reports passing nickel sized clots and using 7 pads per day. She reports that this is the regular time for her period. However, she reports that she never had regular periods. She reports that otc NSAIDS are not helping the pain and nothing has helped the pain and its worse with moving. She has a history of dysmenorrhea and metrorrhagia menorrhea . She reports her periods are more irregular since her abortion. She says this pain is like the pain she had when she had her abortion.  She was using the pill for contraception, but  Has not using anything lately. She is having sex, but uses condoms. Reports having negative fibroid w/u with in last moth   OBGYN: previous classical cesarean at 28 weeks  Med: none    OB History    Gravida Para Term Preterm AB Living   4 2 0 2 2 2    SAB TAB Ectopic Multiple Live Births   1 0 0 0 2      Past Medical History:  Diagnosis Date  . Abscess of axilla, right   . Anemia   . Asthma    NO MEDS  . BV (bacterial vaginosis)   . Chlamydia   . Headache(784.0)    MIGRAINES  . Preterm labor   . Trichomonas   . UTI (lower urinary tract infection)   . Yeast vaginitis     Past Surgical History:  Procedure Laterality Date  . CESAREAN SECTION    . CESAREAN SECTION  04/18/2012   Procedure: CESAREAN SECTION;  Surgeon: Guss Bunde, MD;  Location: [facility];  Service: Gynecology;  Laterality: N/A;  repeat  . cyst removed from L buttock    . CYSTECTOMY  2009   left buttock  . DILATION AND EVACUATION N/A 02/24/2017   Procedure: DILATATION AND EVACUATION;   Surgeon: Lavonia Drafts, MD;  Location: [facility];  Service: Gynecology;  Laterality: N/A;  . INCISION AND DRAINAGE ABSCESS Right 11/01/2014   Procedure: INCISION AND DRAINAGE RIGHT AXILLARY ABSCESS;  Surgeon: Leighton Ruff, MD;  Location: [facility];  Service: General;  Laterality: Right;  . IRRIGATION AND DEBRIDEMENT ABSCESS Right 08/09/2015   Procedure: IRRIGATION AND DEBRIDEMENT AXILLARY ABSCESS;  Surgeon: Alphonsa Overall, MD;  Location: [facility];  Service: General;  Laterality: Right;  . WISDOM TOOTH EXTRACTION    . WISDOM TOOTH EXTRACTION  2009    Family History  Problem Relation Age of Onset  . Hypertension Mother   . Diabetes Mellitus I Father     Social History  Substance Use Topics  . Smoking status: Current Some Day Smoker    Packs/day: 0.25    Years: 0.50    Types: Cigars, Cigarettes  . Smokeless tobacco: Never Used  . Alcohol use No    Allergies:  Allergies  Allergen Reactions  . Vicodin [Hydrocodone-Acetaminophen] Itching    Prescriptions Prior to Admission  Medication Sig Dispense Refill Last Dose  . ibuprofen (ADVIL,MOTRIN) 600 MG tablet Take 1 tablet (600 mg total) by mouth every 6 (six) hours as needed for  mild pain or moderate pain. 30 tablet 0 07/22/2017 at Unknown time  . cyclobenzaprine (FLEXERIL) 10 MG tablet Take 1 tablet (10 mg total) by mouth 2 (two) times daily as needed for muscle spasms. (Patient not taking: Reported on 07/23/2017) 20 tablet 0 Not Taking at Unknown time  . doxycycline (VIBRAMYCIN) 100 MG capsule Take 1 capsule (100 mg total) by mouth 2 (two) times daily. (Patient not taking: Reported on 07/23/2017) 10 capsule 0 Not Taking at Unknown time  . ferrous sulfate (FERROUSUL) 325 (65 FE) MG tablet Take 1 tablet (325 mg total) by mouth 2 (two) times daily with a meal. (Patient not taking: Reported on 07/23/2017) 60 tablet 8 Not Taking at Unknown time  . lidocaine (XYLOCAINE) 2 % jelly Apply 1 application topically as needed. (Patient not taking: Reported  on 07/23/2017) 30 mL 0 Not Taking at Unknown time  . naproxen (NAPROSYN) 500 MG tablet Take 1 tablet (500 mg total) by mouth 2 (two) times daily. (Patient not taking: Reported on 07/23/2017) 30 tablet 0 Not Taking at Unknown time    Review of Systems  Constitutional: Negative.   HENT: Negative.   Eyes: Negative.   Respiratory: Negative.   Cardiovascular: Negative.   Gastrointestinal: Positive for abdominal pain.  Endocrine: Negative.   Genitourinary: Positive for pelvic pain and vaginal bleeding (heavy x 5 days).  Musculoskeletal: Negative.   Skin: Negative.   Allergic/Immunologic: Negative.   Neurological: Negative.   Hematological: Negative.   Psychiatric/Behavioral: Negative.    Physical Exam   Blood pressure 127/80, pulse 85, temperature 97.9 F (36.6 C), resp. rate 18, height 5\' 8"  (1.727 m), weight 93 kg (205 lb), SpO2 100 %, unknown if currently breastfeeding.  Physical Exam  Constitutional: She is oriented to person, place, and time. She appears well-developed and well-nourished.  HENT:  Head: Normocephalic and atraumatic.  Eyes: Pupils are equal, round, and reactive to light.  Cardiovascular: Normal rate, regular rhythm, normal heart sounds and intact distal pulses.   Respiratory: Effort normal and breath sounds normal.  GI: Soft. Normal appearance. She exhibits no distension and no mass. Bowel sounds are decreased. There is no hepatosplenomegaly, splenomegaly or hepatomegaly. There is tenderness in the right lower quadrant, periumbilical area, suprapubic area and left lower quadrant. There is guarding. There is no rigidity, no rebound, no CVA tenderness, no tenderness at McBurney's point and negative Murphy's sign. No hernia.    Genitourinary: Vagina normal. No labial fusion. There is no rash, tenderness, lesion or injury on the right labia. There is no rash, tenderness, lesion or injury on the left labia. Cervix exhibits discharge. Cervix exhibits no motion tenderness and no  friability. Right adnexum displays no mass, no tenderness and no fullness. Left adnexum displays no mass, no tenderness and no fullness.  Genitourinary Comments: Uterus: non-tender, cx; smooth, pink, no lesions, moderate amt of dark, red blood in posterior fornix closed/long/firm, no CMT or friability, no adnexal tenderness   Neurological: She is alert and oriented to person, place, and time.  Skin: Skin is warm and dry.  Psychiatric: She has a normal mood and affect. Her behavior is normal. Judgment and thought content normal.    MAU Course  Procedures  MDM 1020: ddx: meno-meto-dysmenorrhea, appendicitis, ectopic, miscarriage, PID upt-negative  CBC anemia  Wet prep pending Gc chlamydia pendin ++++ 60mg  IM Ketoralac - no improvement / requesting Rx for Percocet    Assessment and Plan  Pt is presenting with abdominal pain and cramping most concerning for dysmenorrhea+menorrhea. Appendicitis  unlikely due to nontoxic presentation, lack of tenderness to McBurney's point, pain in suprapubic and LLQ also. ectopic and miscarriage unlikely 2/2 negative UPT. PID is is less likely given her hx of irregular painfull periods and lack of cervical motion tenderness. Referred for out pt follow up. Given provera 10mg  DQ for10 days and Fe sulfate. No life threatening etiologies appear to present based on clinical picture of pt in Er today.   Menorrhagia with regular cycle - Rx for Percocet 5/325 mg 2 tabs po every 6 hrs - Rx for Provera 10 mg po qd x 10 days - F/U with [facility] in 2 wks -- call to schedule  Discharge home Patient verbalized an understanding of the plan of care and agrees.   Rodney Booze, Medical Student 07/23/2017, 9:59 AM   I confirm that I have verified the information documented in the medical student's note and that I have also personally reperformed the physical exam and all medical decision making activities.   Laury Deep, CNM 07/23/2017 , 4:09 PM

## 2017-07-23 NOTE — Discharge Instructions (Signed)

## 2017-07-26 ENCOUNTER — Telehealth: Payer: Self-pay | Admitting: Infectious Disease

## 2017-07-26 LAB — HIV 1/2 AB DIFFERENTIATION
HIV 1 AB: UNDETERMINED
HIV 2 AB: NEGATIVE

## 2017-07-26 LAB — GC/CHLAMYDIA PROBE AMP (~~LOC~~) NOT AT ARMC
Chlamydia: NEGATIVE
Neisseria Gonorrhea: NEGATIVE

## 2017-07-26 LAB — RNA QUALITATIVE: HIV 1 RNA QUALITATIVE: UNDETERMINED

## 2017-07-26 NOTE — Telephone Encounter (Signed)
Patient with a 4th generation assay + but differentiation assay test negative.  Qualitiatve RNA not done  SHE NEEDS TO BE BROUGHT IN FOR HIV RNA QUANTITATIVE TO ENSURE SHE DOES NOT HAVE ACUTE HIV OR A FALSE POSITIVE

## 2017-07-27 ENCOUNTER — Telehealth: Payer: Self-pay | Admitting: Obstetrics and Gynecology

## 2017-07-27 LAB — HIV ANTIBODY (ROUTINE TESTING W REFLEX): HIV Screen 4th Generation wRfx: REACTIVE — AB

## 2017-07-27 NOTE — Telephone Encounter (Signed)
Has patient been notified?

## 2017-07-27 NOTE — Telephone Encounter (Signed)
Flagged lab result given to me from RN showing + HIV assay.  RNA Qualitative not done due to Quantity of blood insufficent from sample collected while patient in the MAU.  I called and spoke to the patient over the phone, DOB confirmed and results were given to the patient. Per Infectious diseases recommendation: Patient with a 4th generation assay + but differentiation assay test negative.Qualitiatve RNA not done SHE NEEDS TO BE BROUGHT IN FOR HIV RNA QUANTITATIVE TO ENSURE SHE DOES NOT HAVE ACUTE HIV OR A FALSE POSITIVE.  Out patient orders placed for Qualitative RNA and patient to present to [facility] hospital tonight to have out patient labs drawn. Order placed via script pad and given to the patient upon arrival. I will also send a message back to Dr. Roderic Scarce to contact the patient for further plan of care.   Beth Lye, NP 07/27/2017 7:25 PM

## 2017-07-29 LAB — HIV-1 RNA, QUALITATIVE, TMA: HIV-1 RNA, QUAL: NEGATIVE

## 2017-08-02 ENCOUNTER — Telehealth: Payer: Self-pay | Admitting: Infectious Disease

## 2017-08-02 NOTE — Telephone Encounter (Signed)
I called Beth Gibson to let her know that her HIV qualitative RNA was negative and that she does not have HIV.  I talked her about prep and she was interested in coming to see [place] in our prep clinic. She has [facility].  He was tremendously relieved to hear that she did not have HIV.  She wanted to know if she could come to our clinic to have the labs printed that showed that she did not have HIV.  I gave her directions to our clinic so that she can drop by and we can hopefully present the labs for her.  Also like her to get established with our prep clinic and the ID pharmacist.

## 2017-08-03 NOTE — Telephone Encounter (Signed)
Sure, we can definitely do that.

## 2017-09-17 ENCOUNTER — Encounter (HOSPITAL_COMMUNITY): Payer: Self-pay | Admitting: *Deleted

## 2017-09-17 ENCOUNTER — Inpatient Hospital Stay (HOSPITAL_COMMUNITY)
Admission: AD | Admit: 2017-09-17 | Discharge: 2017-09-17 | Disposition: A | Discharge status: Home / Self Care | Payer: Medicaid Other | Source: Ambulatory Visit | Attending: Obstetrics & Gynecology | Admitting: Obstetrics & Gynecology

## 2017-09-17 DIAGNOSIS — J45909 Unspecified asthma, uncomplicated: Secondary | ICD-10-CM | POA: Insufficient documentation

## 2017-09-17 DIAGNOSIS — Z87440 Personal history of urinary (tract) infections: Secondary | ICD-10-CM | POA: Insufficient documentation

## 2017-09-17 DIAGNOSIS — Z885 Allergy status to narcotic agent status: Secondary | ICD-10-CM | POA: Diagnosis not present

## 2017-09-17 DIAGNOSIS — Z8619 Personal history of other infectious and parasitic diseases: Secondary | ICD-10-CM | POA: Diagnosis not present

## 2017-09-17 DIAGNOSIS — R109 Unspecified abdominal pain: Secondary | ICD-10-CM | POA: Diagnosis not present

## 2017-09-17 DIAGNOSIS — N921 Excessive and frequent menstruation with irregular cycle: Secondary | ICD-10-CM

## 2017-09-17 DIAGNOSIS — Z79899 Other long term (current) drug therapy: Secondary | ICD-10-CM | POA: Insufficient documentation

## 2017-09-17 DIAGNOSIS — Z9889 Other specified postprocedural states: Secondary | ICD-10-CM | POA: Diagnosis not present

## 2017-09-17 DIAGNOSIS — N920 Excessive and frequent menstruation with regular cycle: Secondary | ICD-10-CM | POA: Insufficient documentation

## 2017-09-17 DIAGNOSIS — Z833 Family history of diabetes mellitus: Secondary | ICD-10-CM | POA: Diagnosis not present

## 2017-09-17 DIAGNOSIS — F17290 Nicotine dependence, other tobacco product, uncomplicated: Secondary | ICD-10-CM | POA: Diagnosis not present

## 2017-09-17 DIAGNOSIS — Z791 Long term (current) use of non-steroidal anti-inflammatories (NSAID): Secondary | ICD-10-CM | POA: Insufficient documentation

## 2017-09-17 DIAGNOSIS — Z8249 Family history of ischemic heart disease and other diseases of the circulatory system: Secondary | ICD-10-CM | POA: Diagnosis not present

## 2017-09-17 DIAGNOSIS — N946 Dysmenorrhea, unspecified: Secondary | ICD-10-CM | POA: Diagnosis not present

## 2017-09-17 DIAGNOSIS — R1030 Lower abdominal pain, unspecified: Secondary | ICD-10-CM | POA: Diagnosis not present

## 2017-09-17 LAB — WET PREP, GENITAL
Sperm: NONE SEEN
Trich, Wet Prep: NONE SEEN
WBC WET PREP: NONE SEEN
YEAST WET PREP: NONE SEEN

## 2017-09-17 LAB — POCT PREGNANCY, URINE: Preg Test, Ur: NEGATIVE

## 2017-09-17 MED ORDER — KETOROLAC TROMETHAMINE 30 MG/ML IJ SOLN
30.0000 mg | Freq: Once | INTRAMUSCULAR | Status: AC
  Administered 2017-09-17: 30 mg via INTRAMUSCULAR
  Filled 2017-09-17: qty 1

## 2017-09-17 NOTE — MAU Provider Note (Signed)
History     CSN: [id number]  Arrival date and time: 09/17/17 0807   First Provider Initiated Contact with Patient 09/17/17 [phone]      Chief Complaint  Patient presents with  . Abdominal Pain    HPI: Beth Gibson is a 31 y.o. [phone] who presents to maternity admissions reporting lower abdominal pain x 3 days associated with heavy menses. She reports history of menorrhagia for years, and that she always had heavy menses and cramping, which has become worse sine having EAB earlier this year. She reports her menses started 3 days ago and she is having severe cramping, unrelieved with ibuprofen. Reports trying to go to work this morning, but had to leave d/t severe pain. She denies any other symptoms, including no fevers, chills, change in appetite, nausea, vomiting, diarrhea, dysuria, urinary frequency, SOB, DOE, lightheadedness, or dizziness.   She reports she was tried on OCPs in the past for management of menorrhagia, but this did not work. She was set up with an appt at [facility] when she was last seen here for the same problem last month, but she wasn't able to come to appt d/t insurance, but states that since then she has had her Medicaid re-activated.   Past obstetric history: OB History  Gravida Para Term Preterm AB Living  4 2 0 2 2 2   SAB TAB Ectopic Multiple Live Births  1 0 0 0 2    # Outcome Date GA Lbr Len/2nd Weight Sex Delivery Anes PTL Lv  4 Preterm 04/18/12 [redacted]w[redacted]d  5 lb 6.6 oz (2.455 kg) F CS-LTranv Spinal  LIV  3 Preterm 11/30/10 [redacted]w[redacted]d  3 lb 1 oz (1.389 kg) M CS-Classical EPI  LIV  2 AB           1 SAB               Past Medical History:  Diagnosis Date  . Abscess of axilla, right   . Anemia   . Asthma    NO MEDS  . BV (bacterial vaginosis)   . Chlamydia   . Headache(784.0)    MIGRAINES  . Preterm labor   . Trichomonas   . UTI (lower urinary tract infection)   . Yeast vaginitis     Past Surgical History:  Procedure Laterality Date  . CESAREAN SECTION    .  CESAREAN SECTION  04/18/2012   Procedure: CESAREAN SECTION;  Surgeon: Guss Bunde, MD;  Location: [facility];  Service: Gynecology;  Laterality: N/A;  repeat  . cyst removed from L buttock    . CYSTECTOMY  2009   left buttock  . DILATION AND EVACUATION N/A 02/24/2017   Procedure: DILATATION AND EVACUATION;  Surgeon: Lavonia Drafts, MD;  Location: [facility];  Service: Gynecology;  Laterality: N/A;  . INCISION AND DRAINAGE ABSCESS Right 11/01/2014   Procedure: INCISION AND DRAINAGE RIGHT AXILLARY ABSCESS;  Surgeon: Leighton Ruff, MD;  Location: [facility];  Service: General;  Laterality: Right;  . IRRIGATION AND DEBRIDEMENT ABSCESS Right 08/09/2015   Procedure: IRRIGATION AND DEBRIDEMENT AXILLARY ABSCESS;  Surgeon: Alphonsa Overall, MD;  Location: [facility];  Service: General;  Laterality: Right;  . WISDOM TOOTH EXTRACTION    . WISDOM TOOTH EXTRACTION  2009    Family History  Problem Relation Age of Onset  . Hypertension Mother   . Diabetes Mellitus I Father     Social History  Substance Use Topics  . Smoking status: Current Some Day Smoker    Packs/day:  0.25    Years: 0.50    Types: Cigars, Cigarettes  . Smokeless tobacco: Never Used  . Alcohol use No    Allergies:  Allergies  Allergen Reactions  . Vicodin [Hydrocodone-Acetaminophen] Itching    Prescriptions Prior to Admission  Medication Sig Dispense Refill Last Dose  . ferrous sulfate 325 (65 FE) MG EC tablet Take 1 tablet (325 mg total) by mouth daily with breakfast. PLEASE TAKE WITH ORANGE JUICE 60 tablet 3   . ibuprofen (ADVIL,MOTRIN) 600 MG tablet Take 1 tablet (600 mg total) by mouth every 6 (six) hours as needed for mild pain or moderate pain. 30 tablet 0 07/22/2017 at Unknown time  . naproxen (NAPROSYN) 500 MG tablet Take 1 tablet (500 mg total) by mouth 2 (two) times daily. (Patient not taking: Reported on 07/23/2017) 30 tablet 0 Not Taking at Unknown time    Review of Systems - Negative except for what is mentioned in  HPI.  Physical Exam   Blood pressure (!) 135/96, pulse 82, temperature 98.2 F (36.8 C), temperature source Oral, resp. rate 20, last menstrual period 09/14/2017, SpO2 100 %, unknown if currently breastfeeding.  Constitutional: Well-developed, well-nourished female in no acute distress.  HEENT: /AT, sclerae anicteric. Normal oral mucosa, MM Cardiovascular: normal rate, regular rhythm; radial pulses 2+ bilaterally  Respiratory: normal effort, lungs CTAB.  GI: Abd soft, non-tender, gravid appropriate for gestational age.   Pelvic: NEFG, normal appearing vagina mucosa with moderate amount of blood in vaginal vault, cervix clean. No CMT, no adnexal tenderness or fullness. MSK: Extremities nontender, no edema, normal ROM Neurologic: Alert and oriented x 4. Psych: Normal mood and affect  Skin: warm, dry; no pallor, no diaphoresis   MAU Course  Procedures  MDM. Pt nontoxic appearing, VSS. Wet prep, GC/Chl cultures collected. Toradol given for pain with moderate improvement of pain  Discussed treatment options for dysmenorrhea. Discussed role of NSAIDs. Pt requested rx for Percocet several times and reports that this is what she "always gets". Explained indications for narcotics and how narcotics were not indicated for dysmenorrhea.   Assessment and Plan  Assessment: 1. Dysmenorrhea   2. Menorrhagia with irregular cycle    Plan: --Discussed treatment for dysmenorrhea, scheduling NSAIDs, and ultimate treatment options. Advised to schedule NSAIDs now, and at the first sign of menses. She will re-schedule gyn appt to discuss treatment options. Interested in IUD. --Discharge home in stable condition.    , Jenne Pane, MD 09/17/2017 9:24 AM

## 2017-09-17 NOTE — Discharge Instructions (Signed)
Dysmenorrhea Dysmenorrhea means painful cramps during your period (menstrual period). You will have pain in your lower belly (abdomen). The pain is caused by the tightening (contracting) of the muscles of the womb (uterus). The pain may be mild or very bad. With this condition, you may:  Have a headache.  Feel sick to your stomach (nauseous).  Throw up (vomit).  Have lower back pain.  Follow these instructions at home: Helping pain and cramping  Put heat on your lower back or belly when you have pain or cramps. Use the heat source that your doctor tells you to use. ? Place a towel between your skin and the heat. ? Leave the heat on for 20-30 minutes. ? Remove the heat if your skin turns bright red. This is especially important if you cannot feel pain, heat, or cold. ? Do not have a heating pad on during sleep.  Do aerobic exercises. These include walking, swimming, or biking. These may help with cramps.  Massage your lower back or belly. This may help lessen pain. General instructions  Take over-the-counter and prescription medicines only as told by your doctor.  Avoid alcohol and caffeine during and right before your period. These can make cramps worse.  Do not use any products that have nicotine or tobacco. These include cigarettes and e-cigarettes. If you need help quitting, ask your doctor.  Keep all follow-up visits as told by your doctor. This is important. Contact a doctor if:  You have pain that gets worse.  You have pain that does not get better with medicine.  You have pain during sex.  You feel sick to your stomach or you throw up during your period, and medicine does not help. Get help right away if:  You pass out (faint). Summary  Dysmenorrhea means painful cramps during your period (menstrual period).  Put heat on your lower back or belly when you have pain or cramps.  Do exercises like walking, swimming, or biking to help with cramps.  Contact a  doctor if you have pain during sex. This information is not intended to replace advice given to you by your health care provider. Make sure you discuss any questions you have with your health care provider. Document Released: 03/05/2009 Document Revised: 12/24/2016 Document Reviewed: 12/24/2016 Elsevier Interactive Patient Education  2017 [employer].

## 2017-09-17 NOTE — MAU Note (Signed)
Pt present with c/o lower abdominal pain that began 3 days ago.  Pt reports she's on her cycle & the pain has become unbearable as the days have progressed.  Reports flow is heavy and has many blood clots.

## 2017-09-20 LAB — GC/CHLAMYDIA PROBE AMP (~~LOC~~) NOT AT ARMC
CHLAMYDIA, DNA PROBE: NEGATIVE
NEISSERIA GONORRHEA: NEGATIVE

## 2017-10-05 ENCOUNTER — Telehealth: Payer: Self-pay | Admitting: Pharmacist Clinician (PhC)/ Clinical Pharmacy Specialist

## 2017-10-05 NOTE — Telephone Encounter (Signed)
Thanks Minh! 

## 2017-10-05 NOTE — Telephone Encounter (Addendum)
She is pregnant. Left her a VM to call back for an appt about PrEP.   Addendum:  Made an appt for her next week.

## 2017-10-12 ENCOUNTER — Ambulatory Visit: Payer: Self-pay

## 2017-11-10 ENCOUNTER — Other Ambulatory Visit: Payer: Self-pay | Admitting: Obstetrics and Gynecology

## 2018-02-26 ENCOUNTER — Other Ambulatory Visit: Payer: Self-pay

## 2018-02-26 ENCOUNTER — Emergency Department (HOSPITAL_COMMUNITY): Payer: No Typology Code available for payment source

## 2018-02-26 ENCOUNTER — Emergency Department (HOSPITAL_COMMUNITY)
Admission: EM | Admit: 2018-02-26 | Discharge: 2018-02-27 | Disposition: A | Discharge status: Home / Self Care | Payer: No Typology Code available for payment source | Attending: Emergency Medicine | Admitting: Emergency Medicine

## 2018-02-26 ENCOUNTER — Encounter (HOSPITAL_COMMUNITY): Payer: Self-pay | Admitting: Emergency Medicine

## 2018-02-26 DIAGNOSIS — F17210 Nicotine dependence, cigarettes, uncomplicated: Secondary | ICD-10-CM | POA: Diagnosis not present

## 2018-02-26 DIAGNOSIS — Z79899 Other long term (current) drug therapy: Secondary | ICD-10-CM | POA: Insufficient documentation

## 2018-02-26 DIAGNOSIS — M5489 Other dorsalgia: Secondary | ICD-10-CM | POA: Insufficient documentation

## 2018-02-26 DIAGNOSIS — S161XXA Strain of muscle, fascia and tendon at neck level, initial encounter: Secondary | ICD-10-CM | POA: Diagnosis not present

## 2018-02-26 DIAGNOSIS — J45909 Unspecified asthma, uncomplicated: Secondary | ICD-10-CM | POA: Diagnosis not present

## 2018-02-26 DIAGNOSIS — S199XXA Unspecified injury of neck, initial encounter: Secondary | ICD-10-CM | POA: Diagnosis present

## 2018-02-26 DIAGNOSIS — M546 Pain in thoracic spine: Secondary | ICD-10-CM

## 2018-02-26 DIAGNOSIS — Y998 Other external cause status: Secondary | ICD-10-CM | POA: Diagnosis not present

## 2018-02-26 DIAGNOSIS — Y939 Activity, unspecified: Secondary | ICD-10-CM | POA: Diagnosis not present

## 2018-02-26 DIAGNOSIS — M25512 Pain in left shoulder: Secondary | ICD-10-CM | POA: Diagnosis not present

## 2018-02-26 DIAGNOSIS — Y92410 Unspecified street and highway as the place of occurrence of the external cause: Secondary | ICD-10-CM | POA: Diagnosis not present

## 2018-02-26 DIAGNOSIS — R51 Headache: Secondary | ICD-10-CM | POA: Diagnosis not present

## 2018-02-26 MED ORDER — CYCLOBENZAPRINE HCL 10 MG PO TABS: 10.0000 mg | ORAL_TABLET | Freq: Three times a day (TID) | ORAL | 0 refills | Status: DC | PRN

## 2018-02-26 MED ORDER — NAPROXEN 375 MG PO TABS: 375.0000 mg | ORAL_TABLET | Freq: Two times a day (BID) | ORAL | 0 refills | Status: DC

## 2018-02-26 MED ORDER — LIDOCAINE 5 % EX PTCH
1.0000 | MEDICATED_PATCH | Freq: Once | CUTANEOUS | Status: DC
  Administered 2018-02-27: 1 via TRANSDERMAL
  Filled 2018-02-26: qty 1

## 2018-02-26 MED ORDER — CYCLOBENZAPRINE HCL 10 MG PO TABS
10.0000 mg | ORAL_TABLET | Freq: Once | ORAL | Status: AC
  Administered 2018-02-27: 10 mg via ORAL
  Filled 2018-02-26: qty 1

## 2018-02-26 MED ORDER — ACETAMINOPHEN 500 MG PO TABS
1000.0000 mg | ORAL_TABLET | Freq: Once | ORAL | Status: AC
  Administered 2018-02-27: 1000 mg via ORAL
  Filled 2018-02-26: qty 2
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
## 2018-02-26 NOTE — Discharge Instructions (Signed)
Your workup has been reassuring in the ED today.  No broken bones.  This is likely musculoskeletal pain. Please take the Naproxen as prescribed for pain. Do not take any additional NSAIDs including Motrin, Aleve, Ibuprofen, Advil. Please the the flexeril for muscle relaxation. This medication will make you drowsy so avoid situation that could place you in danger.  Warm soaks and Epsom salt and heating pad to the affected area.  Stretches as discussed.  Follow-up with primary care doctor symptoms not improving return the ED with any worsening symptoms.

## 2018-02-27 NOTE — ED Notes (Signed)
Pt understood dc material. NAD noted. Scripts given at dc 

## 2018-02-28 ENCOUNTER — Encounter (HOSPITAL_COMMUNITY): Payer: Self-pay | Admitting: Emergency Medicine

## 2018-02-28 ENCOUNTER — Emergency Department (HOSPITAL_COMMUNITY)
Admission: EM | Admit: 2018-02-28 | Discharge: 2018-02-28 | Disposition: A | Discharge status: Home / Self Care | Payer: No Typology Code available for payment source | Attending: Emergency Medicine | Admitting: Emergency Medicine

## 2018-02-28 ENCOUNTER — Other Ambulatory Visit: Payer: Self-pay

## 2018-02-28 DIAGNOSIS — R51 Headache: Secondary | ICD-10-CM | POA: Insufficient documentation

## 2018-02-28 DIAGNOSIS — Y999 Unspecified external cause status: Secondary | ICD-10-CM | POA: Diagnosis not present

## 2018-02-28 DIAGNOSIS — Y92410 Unspecified street and highway as the place of occurrence of the external cause: Secondary | ICD-10-CM | POA: Diagnosis not present

## 2018-02-28 DIAGNOSIS — Y939 Activity, unspecified: Secondary | ICD-10-CM | POA: Diagnosis not present

## 2018-02-28 DIAGNOSIS — M62838 Other muscle spasm: Secondary | ICD-10-CM | POA: Diagnosis not present

## 2018-02-28 DIAGNOSIS — Z79899 Other long term (current) drug therapy: Secondary | ICD-10-CM | POA: Insufficient documentation

## 2018-02-28 DIAGNOSIS — S199XXA Unspecified injury of neck, initial encounter: Secondary | ICD-10-CM | POA: Diagnosis not present

## 2018-02-28 DIAGNOSIS — M542 Cervicalgia: Secondary | ICD-10-CM

## 2018-02-28 DIAGNOSIS — J45909 Unspecified asthma, uncomplicated: Secondary | ICD-10-CM | POA: Diagnosis not present

## 2018-02-28 DIAGNOSIS — F17210 Nicotine dependence, cigarettes, uncomplicated: Secondary | ICD-10-CM | POA: Diagnosis not present

## 2018-02-28 MED ORDER — PREDNISONE 10 MG (21) PO TBPK: ORAL_TABLET | Freq: Every day | ORAL | 0 refills | Status: DC

## 2018-02-28 NOTE — Discharge Instructions (Signed)
Start taking steroid pack as prescribed.  Do not take ibuprofen, Aleve, Advil, or Motrin while on this medication.  Do not take Naprosyn either.  You may take [phone] mg of Tylenol every 6 hours as needed for pain.  You may take Flexeril up to twice daily as needed for muscle spasms. This medication may make you drowsy, so I typically only recommended at night. If this medication makes you drowsy throughout the day, no driving, drinking alcohol, or operating heavy machinery. You may also cut these tablets in half. Ice to areas of soreness for the next few days and then may move to heat. Do some gentle stretching throughout the day, especially during hot showers or baths. Take short frequent walks and avoid prolonged periods of sitting or laying. Expect to be sore for the next few day and follow up with primary care physician for recheck of ongoing symptoms but return to ER for emergent changing or worsening of symptoms such as severe headache that gets worse, altered mental status/behaving unusually, persistent vomiting, excessive drowsiness, numbness to the arms or legs, unsteady gait, or slurred speech.
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
## 2018-06-01 IMAGING — US US TRANSVAGINAL NON-OB
1 series · 15 of 25 positions shown · non-contrast
Comparison: None.

CLINICAL DATA: Abnormal uterine bleeding since abortion in [REDACTED]

EXAM:
ULTRASOUND PELVIS TRANSVAGINAL
TECHNIQUE: Transvaginal ultrasound examination of the pelvis was performed
including evaluation of the uterus, ovaries, adnexal regions, and
pelvic cul-de-sac.

[Series 1: us transvaginal non-ob · 15 of 33 slices shown]
[im 1/33]
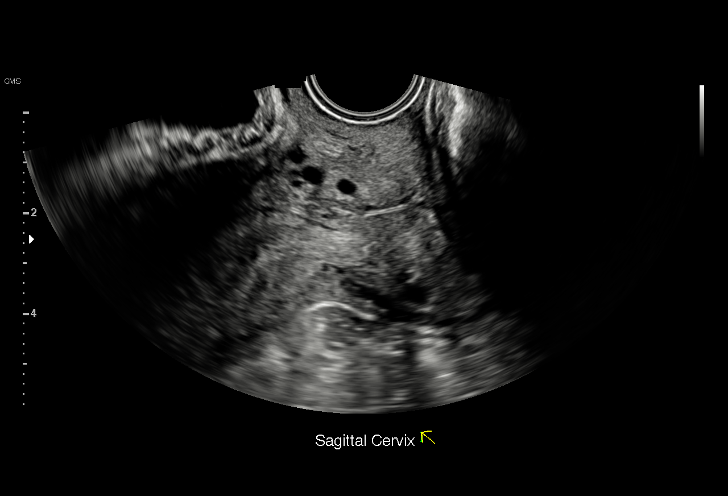
[im 3/33]
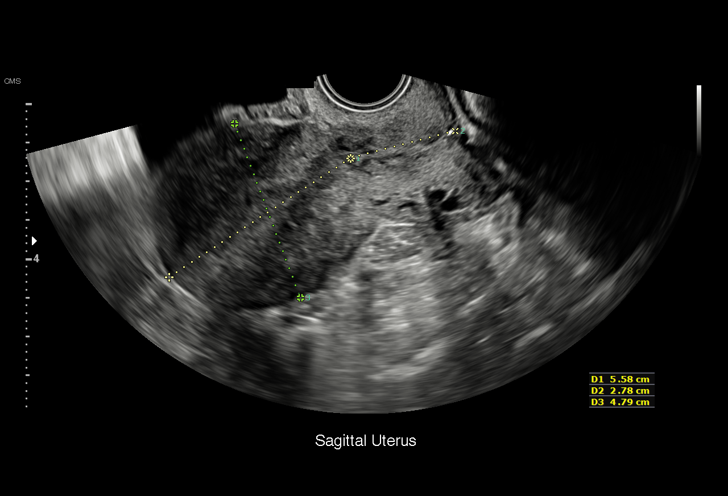
[im 6/33]
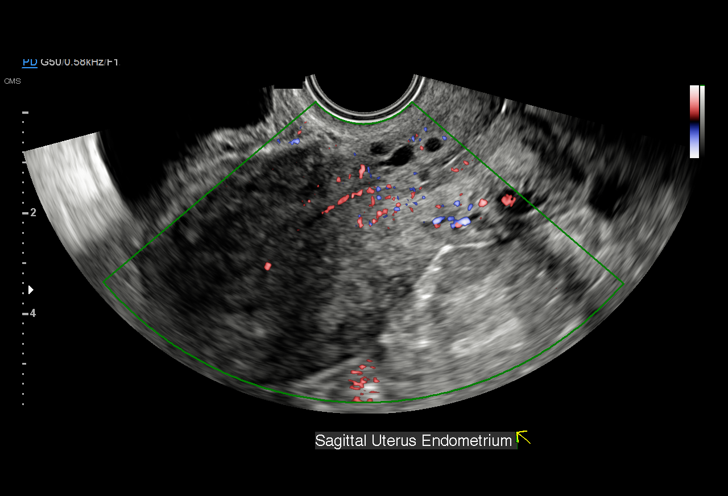
[im 7/33]
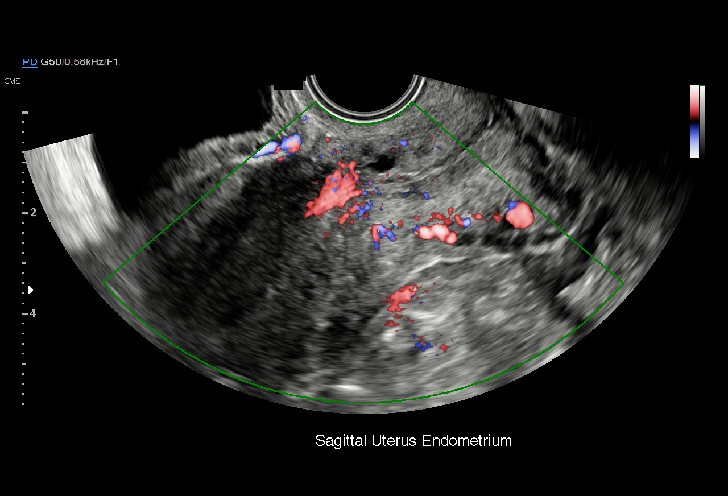
[im 10/33]
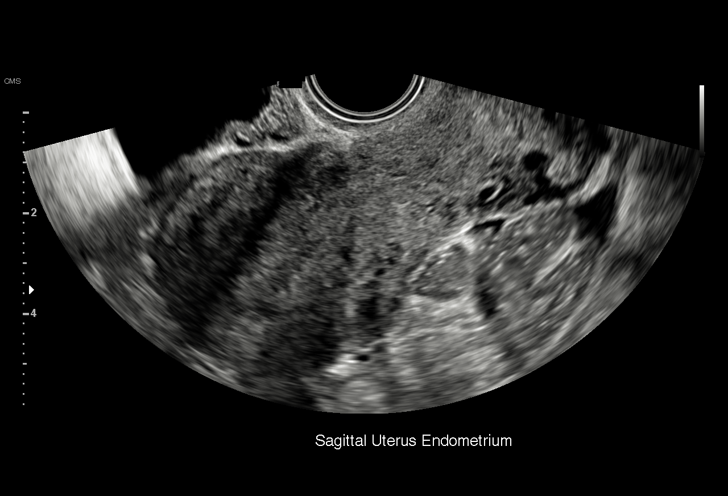
[im 13/33]
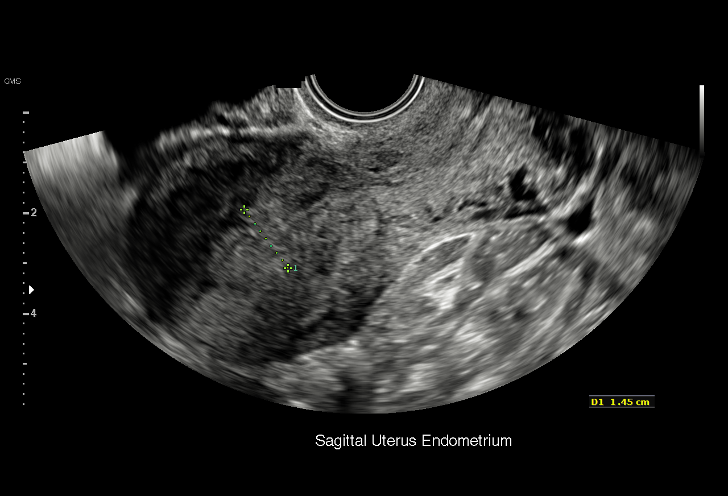
[im 14/33]
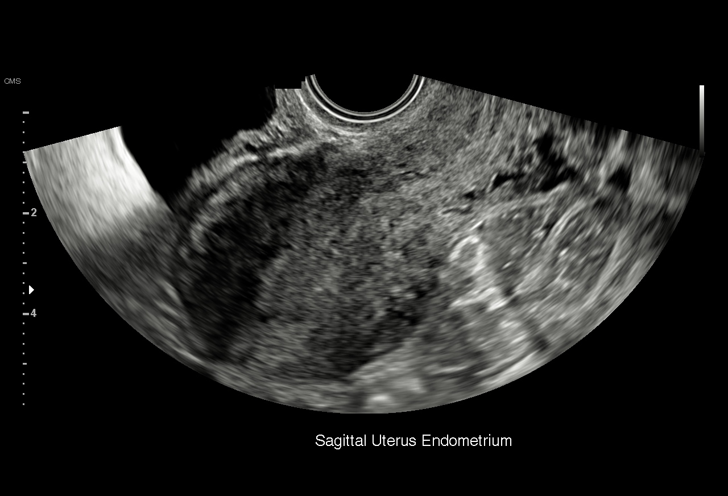
[im 17/33]
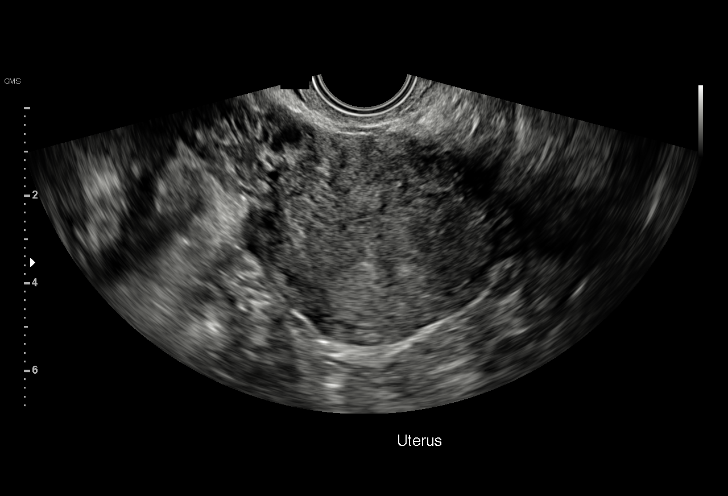
[im 19/33]
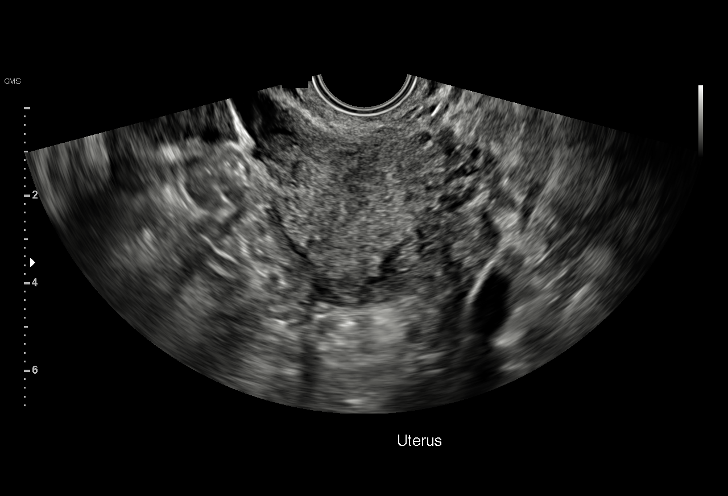
[im 21/33]
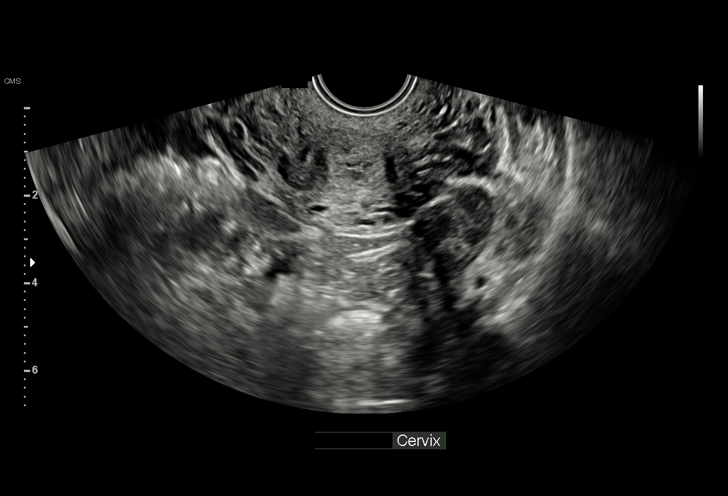
[im 23/33]
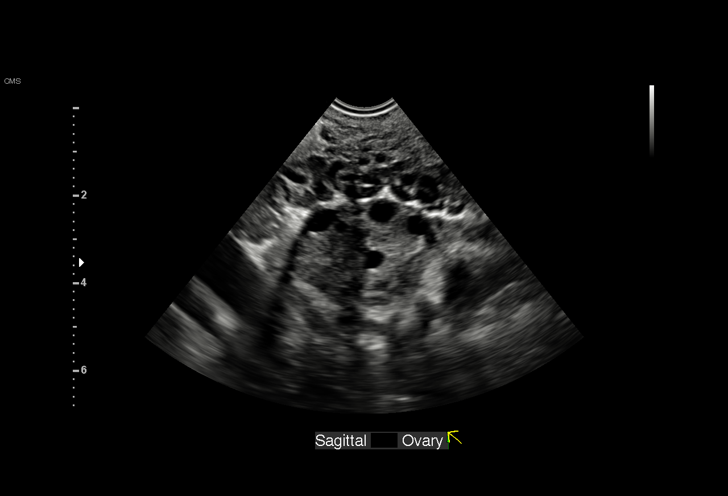
[im 26/33]
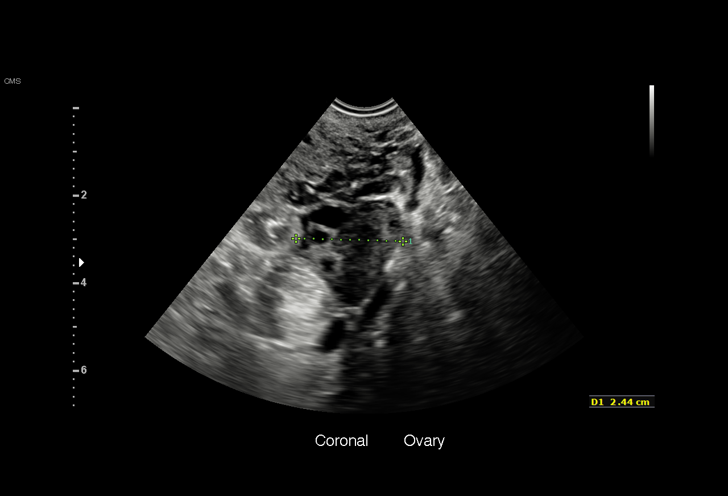
[im 27/33]
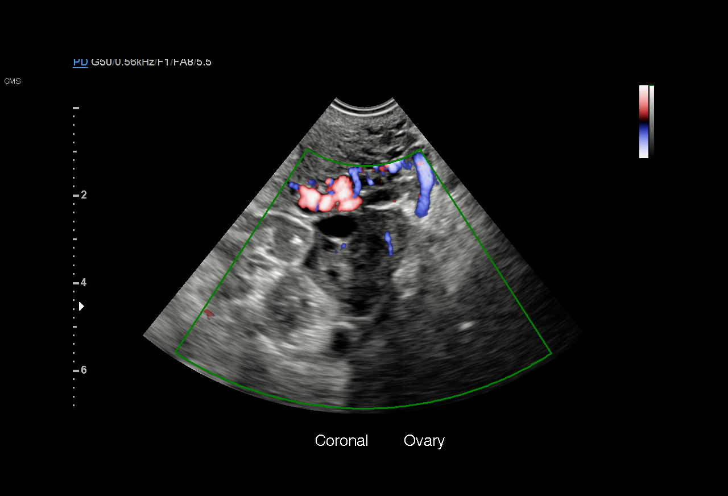
[im 30/33]
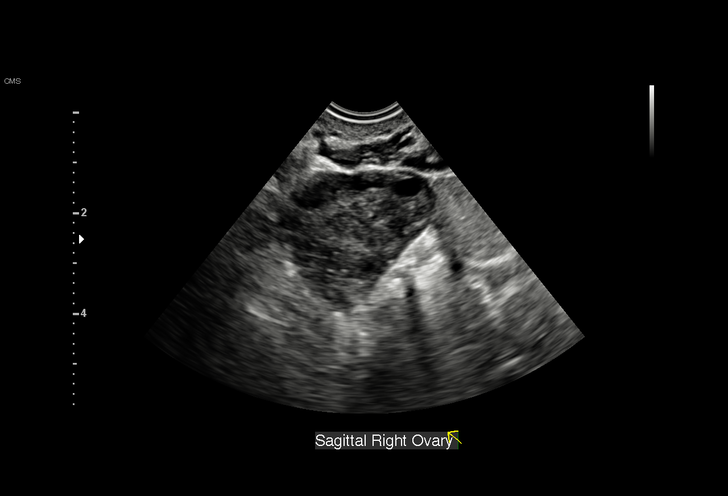
[im 33/33]
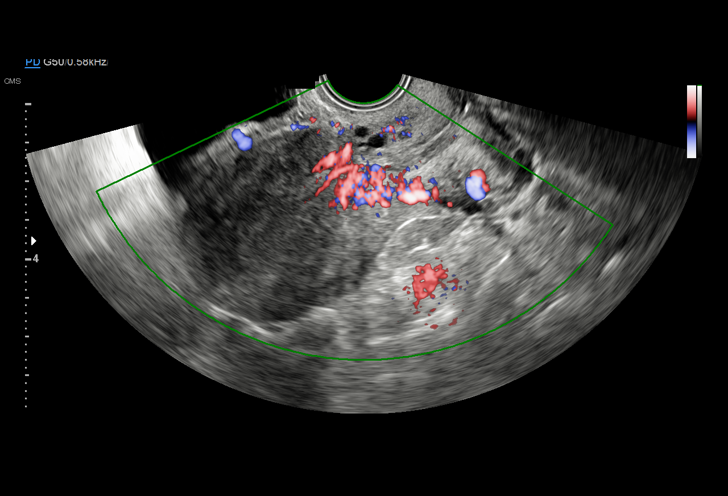

[15 of 25 positions shown; findings below may reference images not displayed]

FINDINGS: Uterus

Measurements: 8.4 x 4.8 x 5.9 cm. No fibroids or other mass
visualized.

Endometrium

Thickness: Endometrium is thickened and heterogeneous measuring
mm. Endometrium as hypervascular.

Right ovary

Measurements: 3.4 x 2.5 x 2.8 cm. Normal appearance/no adnexal mass.

Left ovary

Measurements: 3.7 x 2.4 x 2.4 cm. Normal appearance/no adnexal mass.

Other findings:  No abnormal free fluid
IMPRESSION: Hypervascular, thickened, heterogeneous endometrium concerning for
retained products of conception.

These results will be called to the ordering clinician or
representative by the [HOSPITAL] at the imaging location.

## 2018-06-18 ENCOUNTER — Other Ambulatory Visit: Payer: Self-pay

## 2018-06-18 ENCOUNTER — Ambulatory Visit (HOSPITAL_COMMUNITY)
Admission: EM | Admit: 2018-06-18 | Discharge: 2018-06-18 | Disposition: A | Discharge status: Home / Self Care | Payer: Medicaid Other | Attending: Family Medicine | Admitting: Family Medicine

## 2018-06-18 ENCOUNTER — Encounter (HOSPITAL_COMMUNITY): Payer: Self-pay | Admitting: Family Medicine

## 2018-06-18 DIAGNOSIS — L02411 Cutaneous abscess of right axilla: Secondary | ICD-10-CM

## 2018-06-18 DIAGNOSIS — L732 Hidradenitis suppurativa: Secondary | ICD-10-CM | POA: Diagnosis not present

## 2018-06-18 MED ORDER — OXYCODONE-ACETAMINOPHEN 5-325 MG PO TABS: 1.0000 | ORAL_TABLET | ORAL | 0 refills | Status: DC | PRN

## 2018-06-18 MED ORDER — DOXYCYCLINE HYCLATE 100 MG PO TABS: 100.0000 mg | ORAL_TABLET | Freq: Two times a day (BID) | ORAL | 0 refills | Status: DC

## 2018-06-18 NOTE — ED Triage Notes (Signed)
3 day history of abscess to right axilla

## 2018-06-18 NOTE — ED Provider Notes (Signed)
[facility]   [id number] 06/18/18 Arrival Time: 1212   SUBJECTIVE:  Beth Gibson is a 32 y.o. female who presents to the urgent care with complaint of right axillary recurrent abscess pain with a h/o hidradenitis.  This is the first recurrence in over a year.  She is allergic to vicodin.  Onset:  3 days ago. Rx:  Only heat so far.   Past Medical History:  Diagnosis Date  . Abscess of axilla, right   . Anemia   . Asthma    NO MEDS  . BV (bacterial vaginosis)   . Chlamydia   . Headache(784.0)    MIGRAINES  . Preterm labor   . Trichomonas   . UTI (lower urinary tract infection)   . Yeast vaginitis    Family History  Problem Relation Age of Onset  . Hypertension Mother   . Diabetes Mellitus I Father    Social History   Socioeconomic History  . Marital status: Single    Spouse name: Not on file  . Number of children: Not on file  . Years of education: Not on file  . Highest education level: Not on file  Occupational History  . Not on file  Social Needs  . Financial resource strain: Not on file  . Food insecurity:    Worry: Not on file    Inability: Not on file  . Transportation needs:    Medical: Not on file    Non-medical: Not on file  Tobacco Use  . Smoking status: Current Some Day Smoker    Packs/day: 0.25    Years: 0.50    Pack years: 0.12    Types: Cigars, Cigarettes  . Smokeless tobacco: Never Used  Substance and Sexual Activity  . Alcohol use: No    Alcohol/week: 0.6 oz    Types: 1 Standard drinks or equivalent per week  . Drug use: Yes    Frequency: 3.0 times per week    Types: Marijuana  . Sexual activity: Yes    Birth control/protection: None  Lifestyle  . Physical activity:    Days per week: Not on file    Minutes per session: Not on file  . Stress: Not on file  Relationships  . Social connections:    Talks on phone: Not on file    Gets together: Not on file    Attends religious service: Not on file    Active member of  club or organization: Not on file    Attends meetings of clubs or organizations: Not on file    Relationship status: Not on file  . Intimate partner violence:    Fear of current or ex partner: Not on file    Emotionally abused: Not on file    Physically abused: Not on file    Forced sexual activity: Not on file  Other Topics Concern  . Not on file  Social History Narrative  . Not on file   No outpatient medications have been marked as taking for the 06/18/18 encounter [facility] Encounter).   Allergies  Allergen Reactions  . Vicodin [Hydrocodone-Acetaminophen] Itching      ROS: As per HPI, remainder of ROS negative.   OBJECTIVE:   Vitals:   06/18/18 1306  BP: (!) 143/91  Pulse: 99  Resp: 18  Temp: 98.8 F (37.1 C)  TempSrc: Oral  SpO2: 98%     General appearance: alert; no distress Eyes: PERRL; EOMI; conjunctiva normal HENT: normocephalic; atraumatic; TMs normal, canal normal, external  ears normal without trauma; nasal mucosa normal; oral mucosa normal Neck: supple Back: no CVA tenderness Extremities: no cyanosis or edema; multiple scars right axilla with no fluctuance and considerable induration and tenderness. Skin: warm and dry Neurologic: normal gait; grossly normal Psychological: alert and cooperative; normal mood and affect      Labs:  Results for orders placed or performed during the hospital encounter of 09/17/17  Wet prep, genital  Result Value Ref Range   Yeast Wet Prep HPF POC NONE SEEN NONE SEEN   Trich, Wet Prep NONE SEEN NONE SEEN   Clue Cells Wet Prep HPF POC PRESENT (A) NONE SEEN   WBC, Wet Prep HPF POC NONE SEEN NONE SEEN   Sperm NONE SEEN   Pregnancy, urine POC  Result Value Ref Range   Preg Test, Ur NEGATIVE NEGATIVE  GC/Chlamydia probe amp ([place])not at [facility]  Result Value Ref Range   Chlamydia Negative    Neisseria gonorrhea Negative     Labs Reviewed - No data to display  No results found.     ASSESSMENT &  PLAN:  1. Abscess of axilla, right   2. Hidradenitis     Meds ordered this encounter  Medications  . doxycycline (VIBRA-TABS) 100 MG tablet    Sig: Take 1 tablet (100 mg total) by mouth 2 (two) times daily.    Dispense:  20 tablet    Refill:  0  . oxyCODONE-acetaminophen (PERCOCET/ROXICET) 5-325 MG tablet    Sig: Take 1-2 tablets by mouth every 4 (four) hours as needed for severe pain.    Dispense:  15 tablet    Refill:  0    Reviewed expectations re: course of current medical issues. Questions answered. Outlined signs and symptoms indicating need for more acute intervention. Patient verbalized understanding. After Visit Summary given.    Procedures:      Robyn Haber, MD 06/18/18 1313

## 2018-06-20 ENCOUNTER — Other Ambulatory Visit: Payer: Self-pay

## 2018-06-20 ENCOUNTER — Encounter (HOSPITAL_COMMUNITY): Payer: Self-pay | Admitting: *Deleted

## 2018-06-20 ENCOUNTER — Observation Stay (HOSPITAL_COMMUNITY): Payer: Medicaid Other

## 2018-06-20 ENCOUNTER — Inpatient Hospital Stay (HOSPITAL_COMMUNITY)
Admission: AD | Admit: 2018-06-20 | Discharge: 2018-06-22 | DRG: 603 | Disposition: A | Discharge status: Home / Self Care | Payer: Medicaid Other | Source: Ambulatory Visit | Attending: General Surgery | Admitting: General Surgery

## 2018-06-20 ENCOUNTER — Other Ambulatory Visit: Payer: Self-pay | Admitting: Student

## 2018-06-20 DIAGNOSIS — L03111 Cellulitis of right axilla: Secondary | ICD-10-CM | POA: Diagnosis present

## 2018-06-20 DIAGNOSIS — R52 Pain, unspecified: Secondary | ICD-10-CM

## 2018-06-20 DIAGNOSIS — L02411 Cutaneous abscess of right axilla: Secondary | ICD-10-CM

## 2018-06-20 DIAGNOSIS — L732 Hidradenitis suppurativa: Secondary | ICD-10-CM | POA: Diagnosis present

## 2018-06-20 DIAGNOSIS — F17210 Nicotine dependence, cigarettes, uncomplicated: Secondary | ICD-10-CM | POA: Diagnosis present

## 2018-06-20 HISTORY — DX: Cutaneous abscess of right axilla: L02.411

## 2018-06-20 LAB — CBC WITH DIFFERENTIAL/PLATELET
BASOS PCT: 1 %
Basophils Absolute: 0.1 10*3/uL (ref 0.0–0.1)
EOS PCT: 1 %
Eosinophils Absolute: 0.1 10*3/uL (ref 0.0–0.7)
HEMATOCRIT: 29.3 % — AB (ref 36.0–46.0)
HEMOGLOBIN: 8.7 g/dL — AB (ref 12.0–15.0)
LYMPHS ABS: 1.2 10*3/uL (ref 0.7–4.0)
Lymphocytes Relative: 17 %
MCH: 17.8 pg — ABNORMAL LOW (ref 26.0–34.0)
MCHC: 29.7 g/dL — AB (ref 30.0–36.0)
MCV: 59.8 fL — AB (ref 78.0–100.0)
MONO ABS: 0.4 10*3/uL (ref 0.1–1.0)
MONOS PCT: 6 %
Neutro Abs: 5.4 10*3/uL (ref 1.7–7.7)
Neutrophils Relative %: 75 %
Platelets: 229 10*3/uL (ref 150–400)
RBC: 4.9 MIL/uL (ref 3.87–5.11)
RDW: 20.3 % — ABNORMAL HIGH (ref 11.5–15.5)
WBC: 7.2 10*3/uL (ref 4.0–10.5)

## 2018-06-20 LAB — COMPREHENSIVE METABOLIC PANEL
ALBUMIN: 4 g/dL (ref 3.5–5.0)
ALK PHOS: 46 U/L (ref 38–126)
ALT: 8 U/L (ref 0–44)
AST: 14 U/L — AB (ref 15–41)
Anion gap: 10 (ref 5–15)
BILIRUBIN TOTAL: 0.3 mg/dL (ref 0.3–1.2)
BUN: 8 mg/dL (ref 6–20)
CALCIUM: 9.5 mg/dL (ref 8.9–10.3)
CO2: 25 mmol/L (ref 22–32)
Chloride: 104 mmol/L (ref 98–111)
Creatinine, Ser: 0.64 mg/dL (ref 0.44–1.00)
GFR calc Af Amer: >60 mL/min (ref >60)
GLUCOSE: 109 mg/dL — AB (ref 70–99)
POTASSIUM: 3.6 mmol/L (ref 3.5–5.1)
Sodium: 139 mmol/L (ref 135–145)
Total Protein: 8.8 g/dL — ABNORMAL HIGH (ref 6.5–8.1)

## 2018-06-20 MED ORDER — ONDANSETRON 4 MG PO TBDP
4.0000 mg | ORAL_TABLET | Freq: Four times a day (QID) | ORAL | Status: DC | PRN
  Administered 2018-06-20 – 2018-06-22 (×3): 4 mg via ORAL
  Filled 2018-06-20 (×3): qty 1

## 2018-06-20 MED ORDER — DIPHENHYDRAMINE HCL 50 MG/ML IJ SOLN: 25.0000 mg | Freq: Four times a day (QID) | INTRAMUSCULAR | Status: DC | PRN

## 2018-06-20 MED ORDER — PIPERACILLIN-TAZOBACTAM 3.375 G IVPB
3.3750 g | Freq: Three times a day (TID) | INTRAVENOUS | Status: DC
  Administered 2018-06-20 – 2018-06-22 (×6): 3.375 g via INTRAVENOUS
  Filled 2018-06-20 (×7): qty 50

## 2018-06-20 MED ORDER — OXYCODONE-ACETAMINOPHEN 5-325 MG PO TABS
1.0000 | ORAL_TABLET | ORAL | Status: DC | PRN
  Administered 2018-06-20 – 2018-06-21 (×3): 2 via ORAL
  Administered 2018-06-21: 1 via ORAL
  Administered 2018-06-21: 2 via ORAL
  Administered 2018-06-22 (×2): 1 via ORAL
  Filled 2018-06-20 (×5): qty 2
  Filled 2018-06-20 (×2): qty 1

## 2018-06-20 MED ORDER — DIPHENHYDRAMINE HCL 25 MG PO CAPS: 25.0000 mg | ORAL_CAPSULE | Freq: Four times a day (QID) | ORAL | Status: DC | PRN

## 2018-06-20 MED ORDER — SODIUM CHLORIDE 0.9 % IV SOLN
INTRAVENOUS | Status: DC
  Administered 2018-06-20: 19:00:00 via INTRAVENOUS

## 2018-06-20 MED ORDER — ONDANSETRON HCL 4 MG/2ML IJ SOLN: 4.0000 mg | Freq: Four times a day (QID) | INTRAMUSCULAR | Status: DC | PRN

## 2018-06-20 NOTE — Plan of Care (Signed)
  Problem: Safety: Goal: Ability to remain free from injury will improve Outcome: Progressing   

## 2018-06-20 NOTE — H&P (Signed)
Beth Gibson Documented: 06/20/2018 2:20 PM Location: [facility] Patient #: [id number] DOB: 11/16/1986 Single / Language: Cleophus Molt / Race: Black or African American Female   History of Present Illness  The patient is a 32 year old female who presents with a complaint of Hidradenitis. Past medical history of 2 prior large (6-8cm) right axillary abscesses incised and drained in the OR in 2015 by Dr. Marcello Moores and in 2016 by Dr. Lucia Gaskins, who is presenting with a recurrent flare x 7- days.   She states she has not had any issues with this area until last week. She went to an urgent care 3 days ago and was placed on doxycycline and was given pain medication (percocet), as there was no obvious fluctuance at that time. She completed the percocet with no issues. She states over the past 3 days, the area has become larger, more painful, and is making it difficult to care for her children. She has been doing warm compresses without any improvement. She denies obvious fever/chills. Denies nausea/vomiting.  She is otherwise healthy and is not on any chronic medications. No history of diabetes. She is not taking blood thinners. She smokes half pack of cigarettes a day. She is not had much to eat today as she has a decreased appetite.   Allergies  Vicodin *ANALGESICS - OPIOID*  Allergies Reconciled   Medication History  OxyCODONE HCl (5MG  Tablet, 1 (one) Oral every eight hours, as needed, Taken starting 08/21/2015) Active. No Current Medications (Taken starting 06/20/2018) Medications Reconciled   Review of Systems  General Present- Appetite Loss. Not Present- Chills, Fatigue, Fever, Night Sweats, Weight Gain and Weight Loss. Skin Present- Non-Healing Wounds. Not Present- Change in Wart/Mole, Dryness, Hives, Jaundice, New Lesions, Rash and Ulcer. HEENT Present- Seasonal Allergies. Not Present- Earache, Hearing Loss, Hoarseness, Nose Bleed, Oral Ulcers, Ringing in the Ears, Sinus  Pain, Sore Throat, Visual Disturbances, Wears glasses/contact lenses and Yellow Eyes. Respiratory Not Present- Bloody sputum, Chronic Cough, Difficulty Breathing, Snoring and Wheezing. Breast Not Present- Breast Mass, Breast Pain, Nipple Discharge and Skin Changes. Cardiovascular Not Present- Chest Pain, Difficulty Breathing Lying Down, Leg Cramps, Palpitations, Rapid Heart Rate, Shortness of Breath and Swelling of Extremities. Gastrointestinal Not Present- Abdominal Pain, Bloating, Bloody Stool, Change in Bowel Habits, Chronic diarrhea, Constipation, Difficulty Swallowing, Excessive gas, Gets full quickly at meals, Hemorrhoids, Indigestion, Nausea, Rectal Pain and Vomiting. Female Genitourinary Not Present- Frequency, Nocturia, Painful Urination, Pelvic Pain and Urgency. Musculoskeletal Present- Joint Stiffness, Muscle Pain, Muscle Weakness and Swelling of Extremities. Not Present- Back Pain and Joint Pain. Neurological Not Present- Decreased Memory, Fainting, Headaches, Numbness, Seizures, Tingling, Tremor, Trouble walking and Weakness. Psychiatric Present- Change in Sleep Pattern. Not Present- Anxiety, Bipolar, Depression, Fearful and Frequent crying. Endocrine Not Present- Cold Intolerance, Excessive Hunger, Hair Changes, Heat Intolerance, Hot flashes and New Diabetes. Hematology Not Present- Easy Bruising, Excessive bleeding, Gland problems, HIV and Persistent Infections.  Vitals  06/20/2018 2:21 PM Weight: 216.38 lb Height: 68in Body Surface Area: 2.11 m Body Mass Index: 32.9 kg/m  Temp.: 99.63F(Oral)  Pulse: 118 (Regular)  BP: 124/82 (Sitting, Left Arm, Standard)   Physical Exam  The physical exam findings are as follows: Note:GENERAL: Well-developed, well nourished female, appears to be in pain  EYES: No scleral icterus Pupils equal, lids normal  EXTERNAL EARS: Intact, no masses or lesions EXTERNAL NOSE: Intact, no masses or lesions MOUTH: Lips - no  lesions Dentition - normal for age  RESPIRATORY: Normal effort, no use of accessory muscles  MUSCULOSKELETAL: Normal gait Grossly normal ROM upper extremities Grossly normal ROM lower extremities  SKIN: Warm and dry Not diaphoretic  PSYCHIATRIC: Normal judgement and insight Normal mood and affect Alert, oriented x 3  Integumentary Note: Right axilla: Large area of swelling and induration that is exquisitely tender to palpation No obvious overlying erythema    Assessment & Plan  ABSCESS OF RIGHT AXILLA (O17.711) I&D of right axillary abscess - 11/01/2014 - Dr. Joyice Faster I&D of right axillary abscess - 08/09/2015 - Dr. Keturah Barre. Lucia Gaskins  She is presenting with a third flare of a possible right axillary abscess. She is slightly tachycardic but she is also anxious appearing.  Temp 99.2.    Upon examination, she is exquisitely tender to palpation. Given her history of 2 large abscesses which required I&D in the operating room, and the amount of swelling and induration present, I offered to drain the area in the office today but she refused and requested that she be placed under anesthesia if any procedure is warranted.   As an alternative, I offered 6 weeks of antibiotics plus clindamycin gel but she refused this as well.  I contacted the [facility] who agreed to admit her for IV antibiotics, ultrasound of right axilla, and decision to be made regarding the OR tomorrow. The patient is aware that a bed is available at [facility] and will be there shortly.  Signed electronically by Kellie Shropshire, PA C (06/20/2018 3:44 PM)

## 2018-06-21 ENCOUNTER — Encounter (HOSPITAL_COMMUNITY): Payer: Self-pay | Admitting: *Deleted

## 2018-06-21 ENCOUNTER — Observation Stay (HOSPITAL_COMMUNITY): Payer: Medicaid Other | Admitting: Anesthesiology

## 2018-06-21 ENCOUNTER — Encounter (HOSPITAL_COMMUNITY): Admission: AD | Disposition: A | Discharge status: Home / Self Care | Payer: Self-pay | Source: Ambulatory Visit

## 2018-06-21 DIAGNOSIS — L02411 Cutaneous abscess of right axilla: Secondary | ICD-10-CM | POA: Diagnosis present

## 2018-06-21 DIAGNOSIS — F17210 Nicotine dependence, cigarettes, uncomplicated: Secondary | ICD-10-CM | POA: Diagnosis present

## 2018-06-21 DIAGNOSIS — L03111 Cellulitis of right axilla: Secondary | ICD-10-CM | POA: Diagnosis present

## 2018-06-21 DIAGNOSIS — L732 Hidradenitis suppurativa: Secondary | ICD-10-CM | POA: Diagnosis present

## 2018-06-21 HISTORY — PX: INCISION AND DRAINAGE ABSCESS: SHX5864

## 2018-06-21 LAB — SURGICAL PCR SCREEN
MRSA, PCR: POSITIVE — AB
Staphylococcus aureus: POSITIVE — AB

## 2018-06-21 LAB — HIV ANTIBODY (ROUTINE TESTING W REFLEX): HIV Screen 4th Generation wRfx: NONREACTIVE

## 2018-06-21 SURGERY — INCISION AND DRAINAGE, ABSCESS
Anesthesia: General | Site: Axilla | Laterality: Right

## 2018-06-21 MED ORDER — LIDOCAINE HCL (CARDIAC) PF 100 MG/5ML IV SOSY
PREFILLED_SYRINGE | INTRAVENOUS | Status: DC | PRN
  Administered 2018-06-21: 60 mg via INTRAVENOUS

## 2018-06-21 MED ORDER — MIDAZOLAM HCL 5 MG/5ML IJ SOLN
INTRAMUSCULAR | Status: DC | PRN
  Administered 2018-06-21 (×2): 1 mg via INTRAVENOUS

## 2018-06-21 MED ORDER — PROPOFOL 10 MG/ML IV BOLUS
INTRAVENOUS | Status: AC
  Filled 2018-06-21: qty 20

## 2018-06-21 MED ORDER — DEXAMETHASONE SODIUM PHOSPHATE 10 MG/ML IJ SOLN
INTRAMUSCULAR | Status: DC | PRN
  Administered 2018-06-21: 10 mg via INTRAVENOUS

## 2018-06-21 MED ORDER — MIDAZOLAM HCL 2 MG/2ML IJ SOLN
INTRAMUSCULAR | Status: AC
  Filled 2018-06-21: qty 2

## 2018-06-21 MED ORDER — LACTATED RINGERS IV SOLN
INTRAVENOUS | Status: DC | PRN
  Administered 2018-06-21: 14:00:00 via INTRAVENOUS

## 2018-06-21 MED ORDER — FENTANYL CITRATE (PF) 100 MCG/2ML IJ SOLN
INTRAMUSCULAR | Status: AC
  Filled 2018-06-21: qty 2

## 2018-06-21 MED ORDER — ONDANSETRON HCL 4 MG/2ML IJ SOLN
INTRAMUSCULAR | Status: DC | PRN
  Administered 2018-06-21: 4 mg via INTRAVENOUS

## 2018-06-21 MED ORDER — LACTATED RINGERS IV SOLN
INTRAVENOUS | Status: DC
  Administered 2018-06-21: 14:00:00 via INTRAVENOUS

## 2018-06-21 MED ORDER — LABETALOL HCL 5 MG/ML IV SOLN
INTRAVENOUS | Status: AC
  Filled 2018-06-21: qty 4

## 2018-06-21 MED ORDER — FENTANYL CITRATE (PF) 100 MCG/2ML IJ SOLN
INTRAMUSCULAR | Status: AC
  Filled 2018-06-21: qty 4

## 2018-06-21 MED ORDER — FENTANYL CITRATE (PF) 100 MCG/2ML IJ SOLN
25.0000 ug | INTRAMUSCULAR | Status: DC | PRN
  Administered 2018-06-21 (×3): 50 ug via INTRAVENOUS

## 2018-06-21 MED ORDER — BUPIVACAINE-EPINEPHRINE 0.25% -1:200000 IJ SOLN
INTRAMUSCULAR | Status: DC | PRN
  Administered 2018-06-21: 10 mL

## 2018-06-21 MED ORDER — FENTANYL CITRATE (PF) 100 MCG/2ML IJ SOLN
INTRAMUSCULAR | Status: DC | PRN
  Administered 2018-06-21: 25 ug via INTRAVENOUS
  Administered 2018-06-21: 50 ug via INTRAVENOUS
  Administered 2018-06-21 (×3): 25 ug via INTRAVENOUS
  Administered 2018-06-21: 50 ug via INTRAVENOUS

## 2018-06-21 MED ORDER — PROPOFOL 10 MG/ML IV BOLUS
INTRAVENOUS | Status: DC | PRN
  Administered 2018-06-21: 200 mg via INTRAVENOUS

## 2018-06-21 MED ORDER — MORPHINE SULFATE (PF) 2 MG/ML IV SOLN
2.0000 mg | INTRAVENOUS | Status: DC | PRN
  Administered 2018-06-21 – 2018-06-22 (×7): 2 mg via INTRAVENOUS
  Filled 2018-06-21 (×7): qty 1

## 2018-06-21 MED ORDER — 0.9 % SODIUM CHLORIDE (POUR BTL) OPTIME
TOPICAL | Status: DC | PRN
  Administered 2018-06-21: 1000 mL

## 2018-06-21 MED ORDER — MUPIROCIN 2 % EX OINT
1.0000 | TOPICAL_OINTMENT | Freq: Two times a day (BID) | CUTANEOUS | Status: DC
Start: 2018-06-21 — End: 2018-06-22
  Administered 2018-06-21 – 2018-06-22 (×3): 1 via NASAL
  Filled 2018-06-21 (×2): qty 22

## 2018-06-21 MED ORDER — BUPIVACAINE-EPINEPHRINE (PF) 0.25% -1:200000 IJ SOLN
INTRAMUSCULAR | Status: AC
Start: 2018-06-21 — End: ?
  Filled 2018-06-21: qty 30

## 2018-06-21 MED ORDER — CHLORHEXIDINE GLUCONATE CLOTH 2 % EX PADS
6.0000 | MEDICATED_PAD | Freq: Every day | CUTANEOUS | Status: DC
  Administered 2018-06-21: 6 via TOPICAL

## 2018-06-21 SURGICAL SUPPLY — 37 items
BLADE SURG 15 STRL LF DISP TIS (BLADE) ×1 IMPLANT
BLADE SURG 15 STRL SS (BLADE)
BNDG GAUZE ELAST 4 BULKY (GAUZE/BANDAGES/DRESSINGS) ×2 IMPLANT
DECANTER SPIKE VIAL GLASS SM (MISCELLANEOUS) ×2 IMPLANT
DRAPE LAPAROSCOPIC ABDOMINAL (DRAPES) IMPLANT
DRAPE LAPAROTOMY T 98X78 PEDS (DRAPES) ×2 IMPLANT
DRSG PAD ABDOMINAL 8X10 ST (GAUZE/BANDAGES/DRESSINGS) IMPLANT
ELECT PENCIL ROCKER SW 15FT (MISCELLANEOUS) ×1 IMPLANT
ELECT REM PT RETURN 15FT ADLT (MISCELLANEOUS) ×3 IMPLANT
GAUZE SPONGE 4X4 12PLY STRL (GAUZE/BANDAGES/DRESSINGS) ×2 IMPLANT
GLOVE BIO SURGEON STRL SZ7.5 (GLOVE) ×3 IMPLANT
GLOVE BIOGEL PI IND STRL 6.5 (GLOVE) IMPLANT
GLOVE BIOGEL PI IND STRL 7.0 (GLOVE) IMPLANT
GLOVE BIOGEL PI INDICATOR 6.5 (GLOVE) ×4
GLOVE BIOGEL PI INDICATOR 7.0 (GLOVE) ×2
GLOVE SURG SS PI 6.5 STRL IVOR (GLOVE) ×4 IMPLANT
GOWN STRL REUS W/ TWL LRG LVL4 (GOWN DISPOSABLE) IMPLANT
GOWN STRL REUS W/TWL LRG LVL3 (GOWN DISPOSABLE) ×4 IMPLANT
GOWN STRL REUS W/TWL LRG LVL4 (GOWN DISPOSABLE) ×6
KIT BASIN OR (CUSTOM PROCEDURE TRAY) ×3 IMPLANT
NDL HYPO 25X1 1.5 SAFETY (NEEDLE) IMPLANT
NEEDLE HYPO 25X1 1.5 SAFETY (NEEDLE) ×3 IMPLANT
NS IRRIG 1000ML POUR BTL (IV SOLUTION) ×3 IMPLANT
PACK GENERAL/GYN (CUSTOM PROCEDURE TRAY) ×2 IMPLANT
PAD ABD 8X10 STRL (GAUZE/BANDAGES/DRESSINGS) ×2 IMPLANT
SPONGE LAP 18X18 RF (DISPOSABLE) ×3 IMPLANT
SUT MNCRL AB 4-0 PS2 18 (SUTURE) IMPLANT
SUT VIC AB 3-0 SH 27 (SUTURE)
SUT VIC AB 3-0 SH 27XBRD (SUTURE) IMPLANT
SWAB COLLECTION DEVICE MRSA (MISCELLANEOUS) ×2 IMPLANT
SWAB CULTURE ESWAB REG 1ML (MISCELLANEOUS) ×2 IMPLANT
SYR BULB 3OZ (MISCELLANEOUS) ×1 IMPLANT
SYR BULB IRRIGATION 50ML (SYRINGE) ×2 IMPLANT
SYR CONTROL 10ML LL (SYRINGE) ×2 IMPLANT
TAPE CLOTH SURG 4X10 WHT LF (GAUZE/BANDAGES/DRESSINGS) ×2 IMPLANT
TOWEL OR 17X26 10 PK STRL BLUE (TOWEL DISPOSABLE) ×3 IMPLANT
YANKAUER SUCT BULB TIP NO VENT (SUCTIONS) ×1 IMPLANT

## 2018-06-21 NOTE — Interval H&P Note (Signed)
History and Physical Interval Note:  06/21/2018 2:20 PM  Beth Gibson  has presented today for surgery, with the diagnosis of axillary abscess  The various methods of treatment have been discussed with the patient and family. After consideration of risks, benefits and other options for treatment, the patient has consented to  Procedure(s): INCISION AND DRAINAGE ABSCESS AXILLA (Right) as a surgical intervention .  The patient's history has been reviewed, patient examined, no change in status, stable for surgery.  I have reviewed the patient's chart and labs.  Questions were answered to the patient's satisfaction.     TOTH III,PAUL S

## 2018-06-21 NOTE — Progress Notes (Signed)
    CC:  Right axillary pain   Subjective: Painful but stable.    Objective: Vital signs in last 24 hours: Temp:  [97.7 F (36.5 C)-98.6 F (37 C)] 98.6 F (37 C) (07/02 0550) Pulse Rate:  [84-96] 84 (07/02 0550) Resp:  [17-20] 17 (07/02 0550) BP: (111-135)/(59-92) 111/59 (07/02 0550) SpO2:  [97 %-100 %] 97 % (07/02 0550) Weight:  [98.3 kg (216 lb 11.4 oz)] 98.3 kg (216 lb 11.4 oz) (07/01 1703) Last BM Date: 06/19/18 335 IV Afebrile, VSS Labs OK No radiology studies Intake/Output from previous day: 07/01 0701 - 07/02 0700 In: 376.7 [I.V.:335; IV Piggyback:41.7] Out: -  Intake/Output this shift: No intake/output data recorded.  General appearance: alert, cooperative and no distress Skin: Right Axillary Hidradenitis with cellulitis and swelling.  Lab Results:  Recent Labs    06/20/18 1722  WBC 7.2  HGB 8.7*  HCT 29.3*  PLT 229    BMET Recent Labs    06/20/18 1722  NA 139  K 3.6  CL 104  CO2 25  GLUCOSE 109*  BUN 8  CREATININE 0.64  CALCIUM 9.5   PT/INR No results for input(s): LABPROT, INR in the last 72 hours.  Recent Labs  Lab 06/20/18 1722  AST 14*  ALT 8  ALKPHOS 46  BILITOT 0.3  PROT 8.8*  ALBUMIN 4.0     Lipase     Component Value Date/Time   LIPASE 37 05/21/2016 2227   LIPASE 147 09/05/2014 2343   Prior to Admission medications   Medication Sig Start Date End Date Taking? Authorizing Provider  doxycycline (VIBRA-TABS) 100 MG tablet Take 1 tablet (100 mg total) by mouth 2 (two) times daily. 06/18/18  Yes Robyn Haber, MD  oxyCODONE-acetaminophen (PERCOCET/ROXICET) 5-325 MG tablet Take 1-2 tablets by mouth every 4 (four) hours as needed for severe pain. 06/18/18  Yes Robyn Haber, MD   Anti-infectives (From admission, onward)   Start     Dose/Rate Route Frequency Ordered Stop   06/20/18 1800  piperacillin-tazobactam (ZOSYN) IVPB 3.375 g     3.375 g 12.5 mL/hr over 240 Minutes Intravenous Every 8 hours 06/20/18 1700        . sodium chloride 100 mL/hr at 06/20/18 1839  . piperacillin-tazobactam (ZOSYN)  IV Stopped (06/21/18 8270)      Medications:   Assessment/Plan  Right Axillary Hidradenitis with abscess (I&D Dr. Marcello Moores 2015, Dr. Lucia Gaskins 2016  FEN:  IV fluids/NPO ID:  Lajean Silvius 7/1 =>> day 2 DVT: SCD Follow up:  Dr. Marlou Starks    Plan:  OR later today.    LOS: 0 days    Jakelyn Squyres 06/21/2018 [id number]

## 2018-06-21 NOTE — Discharge Instructions (Signed)
Mechanical Wound Debridement Mechanical wound debridement is a treatment to remove dead tissue from a wound. This helps the wound heal. The treatment involves cleaning the wound (irrigation) and using a pad or gauze (dressing) to remove dead tissue and debris from the wound. There are different types of mechanical wound debridement. Depending on the wound, you may need to repeat this procedure or change to another form of debridement as your wound starts to heal. Tell a health care provider about:  Any allergies you have.  All medicines you are taking, including vitamins, herbs, eye drops, creams, and over-the-counter medicines.  Any blood disorders you have.  Any medical conditions you have, including any conditions that: ? Cause a significant decrease in blood circulation to the part of the body where the wound is, such as peripheral vascular disease. ? Compromise your defense (immune) system or white blood count.  Any surgeries you have had.  Whether you are pregnant or may be pregnant. What are the risks? Generally, this is a safe procedure. However, problems may occur, including:  Infection.  Bleeding.  Damage to healthy tissue in and around your wound.  Soreness or pain.  Failure of the wound to heal.  Scarring.  What happens before the procedure? You may be given antibiotic medicine to help prevent infection. What happens during the procedure?  Your health care provider may apply a numbing medicine (topical anesthetic) to the wound.  Your health care provider will irrigate your wound with a germ-free (sterile), salt-water (saline) solution. This removes debris, bacteria, and dead tissue.  Depending on what type of mechanical wound debridement you are having, your health care provider may do one of the following: ? Put a dressing on your wound. You may have dry gauze pad placed into the wound. Your health care provider will remove the gauze after the wound is dry. Any  dead tissue and debris that has dried into the gauze will be lifted out of the wound (wet-to-dry debridement). ? Use a type of pad (monofilament fiber debridement pad). This pad has a fluffy surface on one side that picks up dead tissue and debris from your wound. Your health care provider wets the pad and wipes it over your wound for several minutes. ? Irrigate your wound with a pressurized stream of solution such as saline or water.  Once your health care provider is finished, he or she may apply a light dressing to your wound. The procedure may vary among health care providers and hospitals. What happens after the procedure?  You may receive medicine for pain.  You will continue to receive antibiotic medicine if it was started before your procedure. This information is not intended to replace advice given to you by your health care provider. Make sure you discuss any questions you have with your health care provider. Document Released: 08/28/2015 Document Revised: 05/14/2016 Document Reviewed: 04/17/2015 Elsevier Interactive Patient Education  [employer].

## 2018-06-21 NOTE — Transfer of Care (Signed)
Immediate Anesthesia Transfer of Care Note  Patient: Beth Gibson  Procedure(s) Performed: INCISION AND DRAINAGE RIGHT ABSCESS AXILLA (Right Axilla)  Patient Location: PACU  Anesthesia Type:General  Level of Consciousness: awake, alert  and oriented  Airway & Oxygen Therapy: Patient Spontanous Breathing and Patient connected to face mask oxygen  Post-op Assessment: Report given to RN and Post -op Vital signs reviewed and stable  Post vital signs: Reviewed and stable  Last Vitals:  Vitals Value Taken Time  BP 141/103 06/21/2018  4:12 PM  Temp 36.9 C 06/21/2018  4:12 PM  Pulse 81 06/21/2018  4:15 PM  Resp 33 06/21/2018  4:15 PM  SpO2 100 % 06/21/2018  4:15 PM  Vitals shown include unvalidated device data.  Last Pain:  Vitals:   06/21/18 0951  TempSrc:   PainSc: Asleep      Patients Stated Pain Goal: 2 (71/69/67 8938)  Complications: No apparent anesthesia complications

## 2018-06-21 NOTE — Anesthesia Postprocedure Evaluation (Signed)
Anesthesia Post Note  Patient: Beth Gibson  Procedure(s) Performed: INCISION AND DRAINAGE RIGHT ABSCESS AXILLA (Right Axilla)     Patient location during evaluation: PACU Anesthesia Type: General Level of consciousness: awake Pain management: pain level controlled Vital Signs Assessment: post-procedure vital signs reviewed and stable Respiratory status: spontaneous breathing Cardiovascular status: stable Postop Assessment: no headache Anesthetic complications: no    Last Vitals:  Vitals:   06/21/18 1630 06/21/18 1645  BP: 130/77 140/90  Pulse: 69 77  Resp: 17 19  Temp:  37.1 C  SpO2: 97% 100%    Last Pain:  Vitals:   06/21/18 1645  TempSrc:   PainSc: 7                  Md Smola

## 2018-06-21 NOTE — H&P (View-Only) (Signed)
    CC:  Right axillary pain   Subjective: Painful but stable.    Objective: Vital signs in last 24 hours: Temp:  [97.7 F (36.5 C)-98.6 F (37 C)] 98.6 F (37 C) (07/02 0550) Pulse Rate:  [84-96] 84 (07/02 0550) Resp:  [17-20] 17 (07/02 0550) BP: (111-135)/(59-92) 111/59 (07/02 0550) SpO2:  [97 %-100 %] 97 % (07/02 0550) Weight:  [98.3 kg (216 lb 11.4 oz)] 98.3 kg (216 lb 11.4 oz) (07/01 1703) Last BM Date: 06/19/18 335 IV Afebrile, VSS Labs OK No radiology studies Intake/Output from previous day: 07/01 0701 - 07/02 0700 In: 376.7 [I.V.:335; IV Piggyback:41.7] Out: -  Intake/Output this shift: No intake/output data recorded.  General appearance: alert, cooperative and no distress Skin: Right Axillary Hidradenitis with cellulitis and swelling.  Lab Results:  Recent Labs    06/20/18 1722  WBC 7.2  HGB 8.7*  HCT 29.3*  PLT 229    BMET Recent Labs    06/20/18 1722  NA 139  K 3.6  CL 104  CO2 25  GLUCOSE 109*  BUN 8  CREATININE 0.64  CALCIUM 9.5   PT/INR No results for input(s): LABPROT, INR in the last 72 hours.  Recent Labs  Lab 06/20/18 1722  AST 14*  ALT 8  ALKPHOS 46  BILITOT 0.3  PROT 8.8*  ALBUMIN 4.0     Lipase     Component Value Date/Time   LIPASE 37 05/21/2016 2227   LIPASE 147 09/05/2014 2343   Prior to Admission medications   Medication Sig Start Date End Date Taking? Authorizing Provider  doxycycline (VIBRA-TABS) 100 MG tablet Take 1 tablet (100 mg total) by mouth 2 (two) times daily. 06/18/18  Yes Robyn Haber, MD  oxyCODONE-acetaminophen (PERCOCET/ROXICET) 5-325 MG tablet Take 1-2 tablets by mouth every 4 (four) hours as needed for severe pain. 06/18/18  Yes Robyn Haber, MD   Anti-infectives (From admission, onward)   Start     Dose/Rate Route Frequency Ordered Stop   06/20/18 1800  piperacillin-tazobactam (ZOSYN) IVPB 3.375 g     3.375 g 12.5 mL/hr over 240 Minutes Intravenous Every 8 hours 06/20/18 1700        . sodium chloride 100 mL/hr at 06/20/18 1839  . piperacillin-tazobactam (ZOSYN)  IV Stopped (06/21/18 7591)      Medications:   Assessment/Plan  Right Axillary Hidradenitis with abscess (I&D Dr. Marcello Moores 2015, Dr. Lucia Gaskins 2016  FEN:  IV fluids/NPO ID:  Lajean Silvius 7/1 =>> day 2 DVT: SCD Follow up:  Dr. Marlou Starks    Plan:  OR later today.    LOS: 0 days    Dashley Monts 06/21/2018 [phone]

## 2018-06-21 NOTE — Anesthesia Procedure Notes (Signed)
Procedure Name: LMA Insertion Date/Time: 06/21/2018 3:30 PM Performed by: Glory Buff, CRNA Pre-anesthesia Checklist: Patient identified, Emergency Drugs available, Suction available and Patient being monitored Patient Re-evaluated:Patient Re-evaluated prior to induction Oxygen Delivery Method: Circle system utilized Preoxygenation: Pre-oxygenation with 100% oxygen Induction Type: IV induction Ventilation: Mask ventilation without difficulty LMA: LMA inserted LMA Size: 4.0 Number of attempts: 1 Placement Confirmation: positive ETCO2 and breath sounds checked- equal and bilateral Tube secured with: Tape Dental Injury: Teeth and Oropharynx as per pre-operative assessment

## 2018-06-21 NOTE — Anesthesia Preprocedure Evaluation (Signed)
Anesthesia Evaluation  Patient identified by MRN, date of birth, ID band Patient awake    Reviewed: Allergy & Precautions, NPO status , Patient's Chart, lab work & pertinent test results  Airway Mallampati: II  TM Distance: >3 FB     Dental   Pulmonary asthma , Current Smoker,    breath sounds clear to auscultation       Cardiovascular negative cardio ROS   Rhythm:Regular Rate:Normal     Neuro/Psych    GI/Hepatic negative GI ROS, Neg liver ROS,   Endo/Other  negative endocrine ROS  Renal/GU negative Renal ROS     Musculoskeletal   Abdominal   Peds  Hematology   Anesthesia Other Findings   Reproductive/Obstetrics                             Anesthesia Physical Anesthesia Plan  ASA: III  Anesthesia Plan: General   Post-op Pain Management:    Induction: Intravenous  PONV Risk Score and Plan: Treatment may vary due to age or medical condition  Airway Management Planned: LMA  Additional Equipment:   Intra-op Plan:   Post-operative Plan: Extubation in OR  Informed Consent: I have reviewed the patients History and Physical, chart, labs and discussed the procedure including the risks, benefits and alternatives for the proposed anesthesia with the patient or authorized representative who has indicated his/her understanding and acceptance.   Dental advisory given  Plan Discussed with: CRNA and Anesthesiologist  Anesthesia Plan Comments:         Anesthesia Quick Evaluation

## 2018-06-21 NOTE — Progress Notes (Signed)
CRITICAL VALUE ALERT  Critical Value:  MRSA +  Date & Time Notied:  06/21/2018 1028  Provider Notified: Creig Hines, PA  Orders Received/Actions taken: Standing orders.

## 2018-06-21 NOTE — Op Note (Signed)
06/20/2018 - 06/21/2018  3:58 PM  PATIENT:  Beth Gibson  32 y.o. female  PRE-OPERATIVE DIAGNOSIS:  Right axillary abscess  POST-OPERATIVE DIAGNOSIS:  Right axillary abscess  PROCEDURE:  Procedure(s): INCISION AND DRAINAGE RIGHT ABSCESS AXILLA (Right)  SURGEON:  Surgeon(s) and Role:    * Jovita Kussmaul, MD - Primary  PHYSICIAN ASSISTANT:   ASSISTANTS: none   ANESTHESIA:   local and general  EBL:  minimal   BLOOD ADMINISTERED:none  DRAINS: none   LOCAL MEDICATIONS USED:  MARCAINE     SPECIMEN:  No Specimen  DISPOSITION OF SPECIMEN:  N/A  COUNTS:  YES  TOURNIQUET:  * No tourniquets in log *  DICTATION: .Dragon Dictation   After informed consent was obtained the patient was brought to the operating room and placed in the supine position on the operating table.  After adequate induction of general anesthesia the patient's right axilla was prepped with ChloraPrep, allowed to dry, and draped in usual sterile manner.  An appropriate timeout was performed.  The area around the palpable induration was infiltrated with quarter percent Marcaine.  An elliptical incision was made in the skin where she had a contracted scar overlying the area of induration.  The incision was carried through the skin and subcutaneous tissue sharply with electrocautery until the abscess cavity was encountered.  Cultures were obtained.  The overlying skin was removed.  The walls of the abscess cavity were excised sharply with the electrocautery back to healthy-appearing tissue.  Any residual inflammatory tissue was fulgurated with the cautery.  The area was then examined and found to be hemostatic.  The wound was packed with Kerlix gauze and sterile dressings were applied.  The patient tolerated the procedure well.  At the end of the case all needle sponge and instrument counts were correct.  The patient was then awakened and taken to recovery in stable condition.  PLAN OF CARE: Admit to inpatient   PATIENT  DISPOSITION:  PACU - hemodynamically stable.   Delay start of Pharmacological VTE agent (>24hrs) due to surgical blood loss or risk of bleeding: no

## 2018-06-22 ENCOUNTER — Encounter (HOSPITAL_COMMUNITY): Payer: Self-pay | Admitting: General Surgery

## 2018-06-22 MED ORDER — ONDANSETRON HCL 4 MG PO TABS: 4.0000 mg | ORAL_TABLET | Freq: Three times a day (TID) | ORAL | 0 refills | Status: DC | PRN

## 2018-06-22 MED ORDER — IBUPROFEN 200 MG PO TABS
600.0000 mg | ORAL_TABLET | Freq: Four times a day (QID) | ORAL | Status: DC
  Administered 2018-06-22: 600 mg via ORAL
  Filled 2018-06-22: qty 3

## 2018-06-22 MED ORDER — IBUPROFEN 600 MG PO TABS: 600.0000 mg | ORAL_TABLET | Freq: Four times a day (QID) | ORAL | 0 refills | Status: DC | PRN

## 2018-06-22 MED ORDER — OXYCODONE-ACETAMINOPHEN 5-325 MG PO TABS: 1.0000 | ORAL_TABLET | Freq: Four times a day (QID) | ORAL | 0 refills | Status: DC | PRN

## 2018-06-22 MED ORDER — SULFAMETHOXAZOLE-TRIMETHOPRIM 800-160 MG PO TABS: 1.0000 | ORAL_TABLET | Freq: Two times a day (BID) | ORAL | 0 refills | Status: DC

## 2018-06-22 NOTE — Discharge Summary (Signed)
[facility] Discharge Summary   Patient ID: Beth Gibson MRN: [id number] DOB/AGE: 01-05-1986 32 y.o.  Admit date: 06/20/2018 Discharge date: 06/22/2018  Admitting Diagnosis: Axillary abscess  Discharge Diagnosis Axillary abscess  Consultants None  Imaging: [place] Chest Soft Tissue  Result Date: 06/20/2018 CLINICAL DATA:  32 year old female with RIGHT axillary pain and swelling for 1 week. History of hidradenitis and prior axillary abscess. EXAM: ULTRASOUND OF THE RIGHT AXILLA COMPARISON:  None FINDINGS: Ultrasound is performed, showing a 4.5 x 1.6 x 4.3 cm collection within the RIGHT axilla compatible with an abscess. IMPRESSION: 4.5 x 1.6 x 4.3 cm RIGHT axillary collection compatible with abscess. Electronically Signed   By: Margarette Canada M.D.   On: 06/20/2018 20:28    Procedures Dr. Marlou Starks (06/21/18) - Incision and drainage  Hospital Course:  Patient is a 32 year old female who presented to [facility] office with R axillary swelling and pain.  Patient was admitted and underwent procedure listed above.  Tolerated procedure well and was transferred to the floor.  Diet was advanced as tolerated.  On POD#1, the patient was voiding well, tolerating diet, ambulating well, pain well controlled, vital signs stable, incisions c/d/i and felt stable for discharge home.  Patient will follow up in our office in 1 weeks and knows to call with questions or concerns. She will call to confirm appointment date/time.    Physical Exam: General:  Alert, NAD, pleasant, comfortable Skin: axillary wound as seen below, packing removed with minimal purulence, mild surrounding induration     Allergies as of 06/22/2018      Reactions   Vicodin [hydrocodone-acetaminophen] Itching      Medication List    STOP taking these medications   doxycycline 100 MG tablet Commonly known as:  VIBRA-TABS     TAKE these medications   ibuprofen 600 MG tablet Commonly known as:  ADVIL,MOTRIN Take 1 tablet (600 mg  total) by mouth every 6 (six) hours as needed for mild pain.   oxyCODONE-acetaminophen 5-325 MG tablet Commonly known as:  PERCOCET/ROXICET Take 1 tablet by mouth every 6 (six) hours as needed for severe pain. What changed:    how much to take  when to take this   sulfamethoxazole-trimethoprim 800-160 MG tablet Commonly known as:  BACTRIM DS,SEPTRA DS Take 1 tablet by mouth 2 (two) times daily.        Follow-up Information    [facility] [place] Follow up on 06/30/2018.   Specialty:  General Surgery Why:  Your appointment is at 10:45AM.  Be at the office 30 minutes early for check in.  Bring photo ID and insurance information.   Contact information: [place]  [phone]           Signed: Brigid Re, [facility] 06/22/2018, 10:27 AM Pager: [phone] Consults: [phone] Mon-Fri 7:00 am-4:30 pm Sat-Sun 7:00 am-11:30 am

## 2018-06-22 NOTE — Care Management Note (Addendum)
Case Management Note  Patient Details  Name: Beth Gibson MRN: [id number] Date of Birth: [birth year]  Subjective/Objective:                  Discharge with dsg changes to the axilla area  Action/Plan: Advanced to do rn hhc  Expected Discharge Date:  06/22/18               Expected Discharge Plan:  [place]  In-House Referral:     Discharge planning Services  CM Consult  Post Acute Care Choice:    Choice offered to:     DME Arranged:    DME Agency:     HH Arranged:  RN [place] Agency:  [facility]  Status of Service:  Completed, signed off  If discussed at [place] of Stay Meetings, dates discussed:    Additional Comments:  Leeroy Cha, RN 06/22/2018, 10:34 AM

## 2018-06-22 NOTE — Progress Notes (Signed)
Beth Gibson to be D/C'd Home per MD order.  Discussed prescriptions and follow up appointments with the patient. Prescriptions given to patient, medication list explained in detail. Pt verbalized understanding.  Allergies as of 06/22/2018      Reactions   Vicodin [hydrocodone-acetaminophen] Itching      Medication List    STOP taking these medications   doxycycline 100 MG tablet Commonly known as:  VIBRA-TABS     TAKE these medications   ibuprofen 600 MG tablet Commonly known as:  ADVIL,MOTRIN Take 1 tablet (600 mg total) by mouth every 6 (six) hours as needed for mild pain.   ondansetron 4 MG tablet Commonly known as:  ZOFRAN Take 1 tablet (4 mg total) by mouth every 8 (eight) hours as needed for nausea or vomiting.   oxyCODONE-acetaminophen 5-325 MG tablet Commonly known as:  PERCOCET/ROXICET Take 1 tablet by mouth every 6 (six) hours as needed for severe pain. What changed:    how much to take  when to take this   sulfamethoxazole-trimethoprim 800-160 MG tablet Commonly known as:  BACTRIM DS,SEPTRA DS Take 1 tablet by mouth 2 (two) times daily.       Vitals:   06/21/18 2034 06/22/18 0443  BP: 115/70 122/78  Pulse: 66 75  Resp: 20 18  Temp: 98.8 F (37.1 C) 97.9 F (36.6 C)  SpO2: 98% 99%    Skin clean, dry and intact without evidence of skin break down, no evidence of skin tears noted. IV catheter discontinued intact. Site without signs and symptoms of complications. Dressing and pressure applied. Pt denies pain at this time. No complaints noted.  An After Visit Summary was printed and given to the patient. Patient escorted via [place], and D/C home via private auto.  Nonie Hoyer S 06/22/2018 12:21 PM

## 2018-06-26 LAB — AEROBIC/ANAEROBIC CULTURE W GRAM STAIN (SURGICAL/DEEP WOUND)

## 2018-06-26 LAB — AEROBIC/ANAEROBIC CULTURE (SURGICAL/DEEP WOUND)

## 2018-10-18 ENCOUNTER — Ambulatory Visit: Payer: Self-pay | Admitting: General Surgery

## 2018-10-31 ENCOUNTER — Encounter (HOSPITAL_BASED_OUTPATIENT_CLINIC_OR_DEPARTMENT_OTHER): Payer: Self-pay

## 2018-10-31 ENCOUNTER — Other Ambulatory Visit: Payer: Self-pay

## 2018-10-31 NOTE — Progress Notes (Signed)
Talked to [facility] about patient's 8.7  hemoglobin on 06/20/18. She stated she would let Dr. Marlou Starks know.

## 2018-11-03 NOTE — Progress Notes (Signed)
Pt in to pick up Ensure pre op drink, instructions reviewed.

## 2018-11-04 ENCOUNTER — Ambulatory Visit (HOSPITAL_BASED_OUTPATIENT_CLINIC_OR_DEPARTMENT_OTHER)
Admission: RE | Admit: 2018-11-04 | Discharge: 2018-11-04 | Disposition: A | Discharge status: Home / Self Care | Payer: Medicaid Other | Source: Ambulatory Visit | Attending: General Surgery | Admitting: General Surgery

## 2018-11-04 ENCOUNTER — Ambulatory Visit (HOSPITAL_BASED_OUTPATIENT_CLINIC_OR_DEPARTMENT_OTHER): Payer: Medicaid Other | Admitting: Certified Registered"

## 2018-11-04 ENCOUNTER — Encounter (HOSPITAL_BASED_OUTPATIENT_CLINIC_OR_DEPARTMENT_OTHER): Payer: Self-pay | Admitting: *Deleted

## 2018-11-04 ENCOUNTER — Encounter (HOSPITAL_BASED_OUTPATIENT_CLINIC_OR_DEPARTMENT_OTHER)
Admission: RE | Disposition: A | Discharge status: Home / Self Care | Payer: Self-pay | Source: Ambulatory Visit | Attending: General Surgery

## 2018-11-04 DIAGNOSIS — L02411 Cutaneous abscess of right axilla: Secondary | ICD-10-CM | POA: Insufficient documentation

## 2018-11-04 DIAGNOSIS — Z885 Allergy status to narcotic agent status: Secondary | ICD-10-CM | POA: Diagnosis not present

## 2018-11-04 DIAGNOSIS — Z886 Allergy status to analgesic agent status: Secondary | ICD-10-CM | POA: Insufficient documentation

## 2018-11-04 DIAGNOSIS — Z79899 Other long term (current) drug therapy: Secondary | ICD-10-CM | POA: Insufficient documentation

## 2018-11-04 DIAGNOSIS — L732 Hidradenitis suppurativa: Secondary | ICD-10-CM | POA: Insufficient documentation

## 2018-11-04 HISTORY — PX: HYDRADENITIS EXCISION: SHX5243

## 2018-11-04 HISTORY — DX: Personal history of other medical treatment: Z92.89

## 2018-11-04 HISTORY — DX: Other specified postprocedural states: Z98.890

## 2018-11-04 HISTORY — DX: Nausea with vomiting, unspecified: R11.2

## 2018-11-04 SURGERY — EXCISION, HIDRADENITIS, AXILLA
Anesthesia: General | Site: Axilla | Laterality: Right

## 2018-11-04 MED ORDER — LACTATED RINGERS IV SOLN
INTRAVENOUS | Status: DC
  Administered 2018-11-04: 11:00:00 via INTRAVENOUS

## 2018-11-04 MED ORDER — BACITRACIN-NEOMYCIN-POLYMYXIN 400-5-5000 EX OINT
TOPICAL_OINTMENT | CUTANEOUS | Status: AC
  Filled 2018-11-04: qty 1

## 2018-11-04 MED ORDER — CELECOXIB 200 MG PO CAPS
ORAL_CAPSULE | ORAL | Status: AC
  Filled 2018-11-04: qty 1

## 2018-11-04 MED ORDER — OXYCODONE-ACETAMINOPHEN 5-325 MG PO TABS
1.0000 | ORAL_TABLET | Freq: Once | ORAL | Status: AC
  Administered 2018-11-04: 1 via ORAL
  Filled 2018-11-04: qty 1

## 2018-11-04 MED ORDER — BUPIVACAINE-EPINEPHRINE 0.25% -1:200000 IJ SOLN
INTRAMUSCULAR | Status: DC | PRN
  Administered 2018-11-04: 20 mL

## 2018-11-04 MED ORDER — GABAPENTIN 300 MG PO CAPS
ORAL_CAPSULE | ORAL | Status: AC
  Filled 2018-11-04: qty 1

## 2018-11-04 MED ORDER — OXYCODONE HCL 5 MG PO TABS: 5.0000 mg | ORAL_TABLET | Freq: Four times a day (QID) | ORAL | 0 refills | Status: DC | PRN

## 2018-11-04 MED ORDER — LIDOCAINE 2% (20 MG/ML) 5 ML SYRINGE
INTRAMUSCULAR | Status: AC
  Filled 2018-11-04: qty 35

## 2018-11-04 MED ORDER — ONDANSETRON HCL 4 MG/2ML IJ SOLN
INTRAMUSCULAR | Status: AC
  Filled 2018-11-04: qty 18

## 2018-11-04 MED ORDER — OXYCODONE HCL 5 MG PO TABS: 5.0000 mg | ORAL_TABLET | Freq: Once | ORAL | Status: DC | PRN

## 2018-11-04 MED ORDER — HYDROMORPHONE HCL 1 MG/ML IJ SOLN
0.2500 mg | INTRAMUSCULAR | Status: DC | PRN
  Administered 2018-11-04: 0.5 mg via INTRAVENOUS

## 2018-11-04 MED ORDER — SCOPOLAMINE 1 MG/3DAYS TD PT72
MEDICATED_PATCH | TRANSDERMAL | Status: AC
  Filled 2018-11-04: qty 1

## 2018-11-04 MED ORDER — CHLORHEXIDINE GLUCONATE CLOTH 2 % EX PADS: 6.0000 | MEDICATED_PAD | Freq: Once | CUTANEOUS | Status: DC

## 2018-11-04 MED ORDER — DEXAMETHASONE SODIUM PHOSPHATE 10 MG/ML IJ SOLN
INTRAMUSCULAR | Status: AC
  Filled 2018-11-04: qty 3

## 2018-11-04 MED ORDER — BACITRACIN-NEOMYCIN-POLYMYXIN OINTMENT TUBE
TOPICAL_OINTMENT | CUTANEOUS | Status: DC | PRN
  Administered 2018-11-04: 1 via TOPICAL

## 2018-11-04 MED ORDER — ONDANSETRON HCL 4 MG/2ML IJ SOLN
INTRAMUSCULAR | Status: DC | PRN
  Administered 2018-11-04: 4 mg via INTRAVENOUS

## 2018-11-04 MED ORDER — LACTATED RINGERS IV SOLN: INTRAVENOUS | Status: DC

## 2018-11-04 MED ORDER — MEPERIDINE HCL 25 MG/ML IJ SOLN: 6.2500 mg | INTRAMUSCULAR | Status: DC | PRN

## 2018-11-04 MED ORDER — GABAPENTIN 300 MG PO CAPS
300.0000 mg | ORAL_CAPSULE | ORAL | Status: AC
  Administered 2018-11-04: 300 mg via ORAL

## 2018-11-04 MED ORDER — OXYCODONE HCL 5 MG/5ML PO SOLN: 5.0000 mg | Freq: Once | ORAL | Status: DC | PRN

## 2018-11-04 MED ORDER — PROPOFOL 10 MG/ML IV BOLUS
INTRAVENOUS | Status: DC | PRN
  Administered 2018-11-04: 200 mg via INTRAVENOUS

## 2018-11-04 MED ORDER — MIDAZOLAM HCL 2 MG/2ML IJ SOLN
INTRAMUSCULAR | Status: AC
  Filled 2018-11-04: qty 2

## 2018-11-04 MED ORDER — MIDAZOLAM HCL 2 MG/2ML IJ SOLN
1.0000 mg | INTRAMUSCULAR | Status: DC | PRN
  Administered 2018-11-04: 2 mg via INTRAVENOUS

## 2018-11-04 MED ORDER — FENTANYL CITRATE (PF) 100 MCG/2ML IJ SOLN
INTRAMUSCULAR | Status: AC
  Filled 2018-11-04: qty 2

## 2018-11-04 MED ORDER — OXYCODONE-ACETAMINOPHEN 5-325 MG PO TABS: 1.0000 | ORAL_TABLET | Freq: Four times a day (QID) | ORAL | 0 refills | Status: DC | PRN

## 2018-11-04 MED ORDER — OXYCODONE HCL 5 MG PO TABS
ORAL_TABLET | ORAL | Status: AC
  Filled 2018-11-04: qty 1

## 2018-11-04 MED ORDER — VANCOMYCIN HCL IN DEXTROSE 1-5 GM/200ML-% IV SOLN
1000.0000 mg | INTRAVENOUS | Status: AC
  Administered 2018-11-04: 1000 mg via INTRAVENOUS

## 2018-11-04 MED ORDER — EPHEDRINE 5 MG/ML INJ
INTRAVENOUS | Status: AC
  Filled 2018-11-04: qty 20

## 2018-11-04 MED ORDER — PHENYLEPHRINE 40 MCG/ML (10ML) SYRINGE FOR IV PUSH (FOR BLOOD PRESSURE SUPPORT)
PREFILLED_SYRINGE | INTRAVENOUS | Status: AC
  Filled 2018-11-04: qty 10

## 2018-11-04 MED ORDER — CELECOXIB 200 MG PO CAPS
200.0000 mg | ORAL_CAPSULE | ORAL | Status: AC
  Administered 2018-11-04: 200 mg via ORAL

## 2018-11-04 MED ORDER — VANCOMYCIN HCL IN DEXTROSE 1-5 GM/200ML-% IV SOLN
INTRAVENOUS | Status: AC
  Filled 2018-11-04: qty 200

## 2018-11-04 MED ORDER — PROMETHAZINE HCL 25 MG/ML IJ SOLN: 6.2500 mg | INTRAMUSCULAR | Status: DC | PRN

## 2018-11-04 MED ORDER — SCOPOLAMINE 1 MG/3DAYS TD PT72
1.0000 | MEDICATED_PATCH | Freq: Once | TRANSDERMAL | Status: DC | PRN
  Administered 2018-11-04: 1.5 mg via TRANSDERMAL

## 2018-11-04 MED ORDER — HYDROMORPHONE HCL 1 MG/ML IJ SOLN
INTRAMUSCULAR | Status: AC
  Filled 2018-11-04: qty 0.5

## 2018-11-04 MED ORDER — DEXAMETHASONE SODIUM PHOSPHATE 4 MG/ML IJ SOLN
INTRAMUSCULAR | Status: DC | PRN
  Administered 2018-11-04: 10 mg via INTRAVENOUS

## 2018-11-04 MED ORDER — FENTANYL CITRATE (PF) 100 MCG/2ML IJ SOLN
50.0000 ug | INTRAMUSCULAR | Status: DC | PRN
  Administered 2018-11-04: 100 ug via INTRAVENOUS

## 2018-11-04 SURGICAL SUPPLY — 44 items
ADH SKN CLS APL DERMABOND .7 (GAUZE/BANDAGES/DRESSINGS)
BLADE SURG 10 STRL SS (BLADE) ×3 IMPLANT
BLADE SURG 15 STRL LF DISP TIS (BLADE) ×1 IMPLANT
BLADE SURG 15 STRL SS (BLADE) ×3
CANISTER SUCT 1200ML W/VALVE (MISCELLANEOUS) ×3 IMPLANT
CHLORAPREP W/TINT 26ML (MISCELLANEOUS) ×3 IMPLANT
COVER BACK TABLE 60X90IN (DRAPES) ×3 IMPLANT
COVER MAYO STAND STRL (DRAPES) ×3 IMPLANT
COVER WAND RF STERILE (DRAPES) IMPLANT
DECANTER SPIKE VIAL GLASS SM (MISCELLANEOUS) ×3 IMPLANT
DERMABOND ADVANCED (GAUZE/BANDAGES/DRESSINGS)
DERMABOND ADVANCED .7 DNX12 (GAUZE/BANDAGES/DRESSINGS) IMPLANT
DRAPE LAPAROSCOPIC ABDOMINAL (DRAPES) ×3 IMPLANT
DRAPE UTILITY XL STRL (DRAPES) ×3 IMPLANT
ELECT COATED BLADE 2.86 ST (ELECTRODE) ×3 IMPLANT
ELECT REM PT RETURN 9FT ADLT (ELECTROSURGICAL) ×3
ELECTRODE REM PT RTRN 9FT ADLT (ELECTROSURGICAL) ×1 IMPLANT
GAUZE SPONGE 4X4 12PLY STRL (GAUZE/BANDAGES/DRESSINGS) ×2 IMPLANT
GLOVE BIO SURGEON STRL SZ7 (GLOVE) ×2 IMPLANT
GLOVE BIO SURGEON STRL SZ7.5 (GLOVE) ×3 IMPLANT
GLOVE BIOGEL PI IND STRL 7.5 (GLOVE) IMPLANT
GLOVE BIOGEL PI INDICATOR 7.5 (GLOVE) ×2
GOWN STRL REUS W/ TWL LRG LVL3 (GOWN DISPOSABLE) ×2 IMPLANT
GOWN STRL REUS W/TWL LRG LVL3 (GOWN DISPOSABLE) ×6
NDL HYPO 25X1 1.5 SAFETY (NEEDLE) ×1 IMPLANT
NEEDLE HYPO 25X1 1.5 SAFETY (NEEDLE) ×3 IMPLANT
NS IRRIG 1000ML POUR BTL (IV SOLUTION) ×3 IMPLANT
PACK BASIN DAY SURGERY FS (CUSTOM PROCEDURE TRAY) ×3 IMPLANT
PENCIL BUTTON HOLSTER BLD 10FT (ELECTRODE) ×3 IMPLANT
SLEEVE SCD COMPRESS KNEE MED (MISCELLANEOUS) ×3 IMPLANT
SPONGE LAP 18X18 RF (DISPOSABLE) ×3 IMPLANT
STAPLER VISISTAT 35W (STAPLE) IMPLANT
SUT ETHILON 4 0 PS 2 18 (SUTURE) ×6 IMPLANT
SUT MON AB 4-0 PC3 18 (SUTURE) ×3 IMPLANT
SUT VIC AB 3-0 54X BRD REEL (SUTURE) IMPLANT
SUT VIC AB 3-0 BRD 54 (SUTURE)
SUT VIC AB 3-0 SH 27 (SUTURE) ×3
SUT VIC AB 3-0 SH 27X BRD (SUTURE) ×1 IMPLANT
SYR CONTROL 10ML LL (SYRINGE) ×3 IMPLANT
TOWEL GREEN STERILE FF (TOWEL DISPOSABLE) ×3 IMPLANT
TOWEL OR NON WOVEN STRL DISP B (DISPOSABLE) ×3 IMPLANT
TUBE CONNECTING 20'X1/4 (TUBING) ×1
TUBE CONNECTING 20X1/4 (TUBING) ×2 IMPLANT
YANKAUER SUCT BULB TIP NO VENT (SUCTIONS) ×3 IMPLANT

## 2018-11-04 NOTE — Interval H&P Note (Signed)
History and Physical Interval Note:  11/04/2018 10:53 AM  Beth Gibson  has presented today for surgery, with the diagnosis of hidradenitis right axilla  The various methods of treatment have been discussed with the patient and family. After consideration of risks, benefits and other options for treatment, the patient has consented to  Procedure(s): EXCISION HIDRADENITIS RIGHT  AXILLA (Right) as a surgical intervention .  The patient's history has been reviewed, patient examined, no change in status, stable for surgery.  I have reviewed the patient's chart and labs.  Questions were answered to the patient's satisfaction.     Autumn Messing III

## 2018-11-04 NOTE — Anesthesia Postprocedure Evaluation (Signed)
Anesthesia Post Note  Patient: Beth Gibson  Procedure(s) Performed: EXCISION HIDRADENITIS RIGHT  AXILLA (Right Axilla)     Patient location during evaluation: PACU Anesthesia Type: General Level of consciousness: awake and alert Pain management: pain level controlled Vital Signs Assessment: post-procedure vital signs reviewed and stable Respiratory status: spontaneous breathing, nonlabored ventilation, respiratory function stable and patient connected to nasal cannula oxygen Cardiovascular status: blood pressure returned to baseline and stable Postop Assessment: no apparent nausea or vomiting Anesthetic complications: no    Last Vitals:  Vitals:   11/04/18 1345 11/04/18 1404  BP: 124/80 128/81  Pulse: 74 90  Resp: 12 20  Temp:  36.9 C  SpO2: 100% 99%                  Effie Berkshire

## 2018-11-04 NOTE — H&P (Signed)
Beth Gibson  Location: [facility] Patient #: [id number] DOB: 1986/07/03 Single / Language: Cleophus Molt / Race: Black or African American Female   History of Present Illness  The patient is a 32 year old female who presents for a follow-up for Hidradenitis. The patient is a 32 year old black female who has a history of hidradenitis of the right axilla. She has had multiple incision and drainage procedures. She returns today with continued pain and drainage from the right axilla. She has been on antibiotics and it has improved but it has not resolved completely.   Allergies  Vicodin *ANALGESICS - OPIOID*  Allergies Reconciled   Medication History  Percocet (5-325MG  Tablet, 1 (one) Oral every twelve hours, as needed, Taken starting 10/03/2018) Active. Medications Reconciled    Review of Systems General Present- Appetite Loss. Not Present- Chills, Fatigue, Fever, Night Sweats, Weight Gain and Weight Loss. Skin Present- Non-Healing Wounds. Not Present- Change in Wart/Mole, Dryness, Hives, Jaundice, New Lesions, Rash and Ulcer. HEENT Present- Seasonal Allergies. Not Present- Earache, Hearing Loss, Hoarseness, Nose Bleed, Oral Ulcers, Ringing in the Ears, Sinus Pain, Sore Throat, Visual Disturbances, Wears glasses/contact lenses and Yellow Eyes. Respiratory Not Present- Bloody sputum, Chronic Cough, Difficulty Breathing, Snoring and Wheezing. Breast Not Present- Breast Mass, Breast Pain, Nipple Discharge and Skin Changes. Cardiovascular Not Present- Chest Pain, Difficulty Breathing Lying Down, Leg Cramps, Palpitations, Rapid Heart Rate, Shortness of Breath and Swelling of Extremities. Gastrointestinal Not Present- Abdominal Pain, Bloating, Bloody Stool, Change in Bowel Habits, Chronic diarrhea, Constipation, Difficulty Swallowing, Excessive gas, Gets full quickly at meals, Hemorrhoids, Indigestion, Nausea, Rectal Pain and Vomiting. Female Genitourinary Not Present- Frequency,  Nocturia, Painful Urination, Pelvic Pain and Urgency. Musculoskeletal Present- Joint Stiffness, Muscle Pain, Muscle Weakness and Swelling of Extremities. Not Present- Back Pain and Joint Pain. Neurological Not Present- Decreased Memory, Fainting, Headaches, Numbness, Seizures, Tingling, Tremor, Trouble walking and Weakness. Psychiatric Present- Change in Sleep Pattern. Not Present- Anxiety, Bipolar, Depression, Fearful and Frequent crying. Endocrine Not Present- Cold Intolerance, Excessive Hunger, Hair Changes, Heat Intolerance, Hot flashes and New Diabetes. Hematology Not Present- Easy Bruising, Excessive bleeding, Gland problems, HIV and Persistent Infections.  Vitals  Weight: 212 lb Height: 68in Body Surface Area: 2.1 m Body Mass Index: 32.23 kg/m  Temp.: 79F  Pulse: 100 (Regular)  Resp.: 18 (Unlabored)  P.OX: 98% (Room air) BP: 122/82 (Sitting, Left Arm, Standard)       Physical Exam  General Mental Status-Alert. General Appearance-Consistent with stated age. Hydration-Well hydrated. Voice-Normal.  Integumentary Note: There is some chronic hidradenitis in the right axilla with some drainage of a small amount of pus from the open portion of the incision. There is no surrounding cellulitis.   Head and Neck Head-normocephalic, atraumatic with no lesions or palpable masses. Trachea-midline. Thyroid Gland Characteristics - normal size and consistency.  Eye Eyeball - Bilateral-Extraocular movements intact. Sclera/Conjunctiva - Bilateral-No scleral icterus.  Chest and Lung Exam Chest and lung exam reveals -quiet, even and easy respiratory effort with no use of accessory muscles and on auscultation, normal breath sounds, no adventitious sounds and normal vocal resonance. Inspection Chest Wall - Normal. Back - normal.  Cardiovascular Cardiovascular examination reveals -normal heart sounds, regular rate and rhythm with no murmurs and  normal pedal pulses bilaterally.  Abdomen Inspection Inspection of the abdomen reveals - No Hernias. Skin - Scar - no surgical scars. Palpation/Percussion Palpation and Percussion of the abdomen reveal - Soft, Non Tender, No Rebound tenderness, No Rigidity (guarding) and No hepatosplenomegaly. Auscultation Auscultation  of the abdomen reveals - Bowel sounds normal.  Neurologic Neurologic evaluation reveals -alert and oriented x 3 with no impairment of recent or remote memory. Mental Status-Normal.  Musculoskeletal Normal Exam - Left-Upper Extremity Strength Normal and Lower Extremity Strength Normal. Normal Exam - Right-Upper Extremity Strength Normal and Lower Extremity Strength Normal.  Lymphatic Head & Neck  General Head & Neck Lymphatics: Bilateral - Description - Normal. Axillary  General Axillary Region: Bilateral - Description - Normal. Tenderness - Non Tender. Femoral & Inguinal  Generalized Femoral & Inguinal Lymphatics: Bilateral - Description - Normal. Tenderness - Non Tender.    Assessment & Plan  ABSCESS OF RIGHT AXILLA [phone]) Story: I&D of right axillary abscess - 11/01/2014 - Dr. Joyice Faster I&D of right axillary abscess - 08/09/2015 - Dr. Keturah Barre. Newman I&D of right axillary abscess - 06/21/18 - Dr. Mamie Nick. Toth Impression: The patient has a history of hidradenitis in the right axilla with multiple incision and drainage procedures. She continues to have pain and drainage from one particular area. Because of the [place] be reasonable to try to excise this area and get it to heal primarily. I have discussed with her in detail the risks and benefits of the operation as well as some of the technical aspects and she understands and wishes to proceed

## 2018-11-04 NOTE — Op Note (Signed)
11/04/2018  12:03 PM  PATIENT:  Beth Gibson  32 y.o. female  PRE-OPERATIVE DIAGNOSIS:  hidradenitis right axilla  POST-OPERATIVE DIAGNOSIS:  hidradenitis right axilla  PROCEDURE:  Procedure(s): EXCISION HIDRADENITIS RIGHT  AXILLA (Right)  SURGEON:  Surgeon(s) and Role:    * Jovita Kussmaul, MD - Primary  PHYSICIAN ASSISTANT:   ASSISTANTS: none   ANESTHESIA:   local and general  EBL:  minimal   BLOOD ADMINISTERED:none  DRAINS: none   LOCAL MEDICATIONS USED:  MARCAINE     SPECIMEN:  Source of Specimen:  hidradenitis right axilla  DISPOSITION OF SPECIMEN:  PATHOLOGY  COUNTS:  YES  TOURNIQUET:  * No tourniquets in log *  DICTATION: .Dragon Dictation   After informed consent was obtained the patient was brought to the operating room and placed in the supine position on the operating table.  After adequate induction of general anesthesia the patient's right axilla was prepped with Betadine and draped in usual sterile manner.  An appropriate timeout was performed.  The area around the area of hidradenitis was infiltrated with quarter percent Marcaine.  An elliptical incision was made in the skin surrounding the area of hidradenitis with a 15 blade knife.  The incision was carried through the skin and subcutaneous tissue sharply with electrocautery until the entire area of hidradenitis was removed.  The subcutaneous tissue in the area appeared normal.  All of the chronic granulated tissue was removed.  Hemostasis was achieved using the Bovie electrocautery.  The wound was then infiltrated with more quarter percent Marcaine.  The wound was then closed with a combination of interrupted 4-0 nylon vertical mattress stitches as well as interrupted 4-0 nylon simple stitches.  Triple antibiotic ointment and sterile dressings were then applied.  The patient tolerated the procedure well.  At the end of the case all needle sponge and instrument counts were correct.  The patient was then  awakened and taken to recovery in stable condition.  The area excised measured about 6 x 3 cm.  PLAN OF CARE: Discharge to home after PACU  PATIENT DISPOSITION:  PACU - hemodynamically stable.   Delay start of Pharmacological VTE agent (>24hrs) due to surgical blood loss or risk of bleeding: not applicable

## 2018-11-04 NOTE — Anesthesia Procedure Notes (Signed)
Procedure Name: LMA Insertion Date/Time: 11/04/2018 11:21 AM Performed by: Signe Colt, CRNA Pre-anesthesia Checklist: Patient identified, Emergency Drugs available, Suction available and Patient being monitored Patient Re-evaluated:Patient Re-evaluated prior to induction Oxygen Delivery Method: Circle system utilized Preoxygenation: Pre-oxygenation with 100% oxygen Induction Type: IV induction Ventilation: Mask ventilation without difficulty LMA: LMA inserted LMA Size: 4.0 Number of attempts: 1 Airway Equipment and Method: Bite block Placement Confirmation: positive ETCO2 Tube secured with: Tape Dental Injury: Teeth and Oropharynx as per pre-operative assessment

## 2018-11-04 NOTE — Anesthesia Preprocedure Evaluation (Addendum)
Anesthesia Evaluation  Patient identified by MRN, date of birth, ID band Patient awake    Reviewed: Allergy & Precautions, NPO status , Patient's Chart, lab work & pertinent test results  History of Anesthesia Complications (+) PONV  Airway Mallampati: II  TM Distance: >3 FB Neck ROM: Full    Dental  (+) Teeth Intact, Dental Advisory Given   Pulmonary asthma , Current Smoker,    breath sounds clear to auscultation       Cardiovascular negative cardio ROS   Rhythm:Regular Rate:Normal     Neuro/Psych  Headaches, Depression    GI/Hepatic negative GI ROS, Neg liver ROS,   Endo/Other  negative endocrine ROS  Renal/GU negative Renal ROS     Musculoskeletal negative musculoskeletal ROS (+)   Abdominal   Peds  Hematology negative hematology ROS (+)   Anesthesia Other Findings   Reproductive/Obstetrics                            Anesthesia Physical Anesthesia Plan  ASA: II  Anesthesia Plan: General   Post-op Pain Management:    Induction: Intravenous  PONV Risk Score and Plan: 4 or greater and Ondansetron, Dexamethasone, Midazolam and Scopolamine patch - Pre-op  Airway Management Planned: LMA  Additional Equipment: None  Intra-op Plan:   Post-operative Plan: Extubation in OR  Informed Consent:   Plan Discussed with: CRNA  Anesthesia Plan Comments:         Anesthesia Quick Evaluation

## 2018-11-04 NOTE — Transfer of Care (Signed)
Immediate Anesthesia Transfer of Care Note  Patient: Beth Gibson  Procedure(s) Performed: EXCISION HIDRADENITIS RIGHT  AXILLA (Right Axilla)  Patient Location: PACU  Anesthesia Type:General  Level of Consciousness: awake, alert , oriented and patient cooperative  Airway & Oxygen Therapy: Patient Spontanous Breathing and Patient connected to face mask oxygen  Post-op Assessment: Report given to RN and Post -op Vital signs reviewed and stable  Post vital signs: Reviewed and stable  Last Vitals:  Vitals Value Taken Time  BP    Temp    Pulse    Resp    SpO2      Last Pain:  Vitals:   11/04/18 1046  TempSrc: Oral  PainSc:          Complications: No apparent anesthesia complications

## 2018-11-04 NOTE — Discharge Instructions (Signed)
No ibuprofen until after 7:00pm!    Post Anesthesia Home Care Instructions  Activity: Get plenty of rest for the remainder of the day. A responsible individual must stay with you for 24 hours following the procedure.  For the next 24 hours, DO NOT: -Drive a car -Paediatric nurse -Drink alcoholic beverages -Take any medication unless instructed by your physician -Make any legal decisions or sign important papers.  Meals: Start with liquid foods such as gelatin or soup. Progress to regular foods as tolerated. Avoid greasy, spicy, heavy foods. If nausea and/or vomiting occur, drink only clear liquids until the nausea and/or vomiting subsides. Call your physician if vomiting continues.  Special Instructions/Symptoms: Your throat may feel dry or sore from the anesthesia or the breathing tube placed in your throat during surgery. If this causes discomfort, gargle with warm salt water. The discomfort should disappear within 24 hours.  If you had a scopolamine patch placed behind your ear for the management of post- operative nausea and/or vomiting:  1. The medication in the patch is effective for 72 hours, after which it should be removed.  Wrap patch in a tissue and discard in the trash. Wash hands thoroughly with soap and water. 2. You may remove the patch earlier than 72 hours if you experience unpleasant side effects which may include dry mouth, dizziness or visual disturbances. 3. Avoid touching the patch. Wash your hands with soap and water after contact with the patch.

## 2018-11-07 ENCOUNTER — Encounter (HOSPITAL_BASED_OUTPATIENT_CLINIC_OR_DEPARTMENT_OTHER): Payer: Self-pay | Admitting: General Surgery

## 2018-12-21 DIAGNOSIS — A549 Gonococcal infection, unspecified: Secondary | ICD-10-CM

## 2018-12-21 DIAGNOSIS — A749 Chlamydial infection, unspecified: Secondary | ICD-10-CM

## 2018-12-21 HISTORY — DX: Chlamydial infection, unspecified: A74.9

## 2018-12-21 HISTORY — DX: Gonococcal infection, unspecified: A54.9

## 2019-01-18 ENCOUNTER — Inpatient Hospital Stay (HOSPITAL_COMMUNITY): Payer: Medicaid Other

## 2019-01-18 ENCOUNTER — Inpatient Hospital Stay (HOSPITAL_COMMUNITY)
Admission: AD | Admit: 2019-01-18 | Discharge: 2019-01-18 | Disposition: A | Discharge status: Home / Self Care | Payer: Medicaid Other | Attending: Obstetrics and Gynecology | Admitting: Obstetrics and Gynecology

## 2019-01-18 DIAGNOSIS — F17210 Nicotine dependence, cigarettes, uncomplicated: Secondary | ICD-10-CM | POA: Insufficient documentation

## 2019-01-18 DIAGNOSIS — R1013 Epigastric pain: Secondary | ICD-10-CM | POA: Diagnosis not present

## 2019-01-18 DIAGNOSIS — R1033 Periumbilical pain: Secondary | ICD-10-CM | POA: Diagnosis present

## 2019-01-18 LAB — URINALYSIS, ROUTINE W REFLEX MICROSCOPIC
Bilirubin Urine: NEGATIVE
GLUCOSE, UA: NEGATIVE mg/dL
Hgb urine dipstick: NEGATIVE
Ketones, ur: NEGATIVE mg/dL
LEUKOCYTES UA: NEGATIVE
Nitrite: NEGATIVE
PH: 6 (ref 5.0–8.0)
PROTEIN: NEGATIVE mg/dL
SPECIFIC GRAVITY, URINE: 1.017 (ref 1.005–1.030)

## 2019-01-18 LAB — CBC
HEMATOCRIT: 30 % — AB (ref 36.0–46.0)
HEMOGLOBIN: 8.6 g/dL — AB (ref 12.0–15.0)
MCH: 17.7 pg — ABNORMAL LOW (ref 26.0–34.0)
MCHC: 28.7 g/dL — ABNORMAL LOW (ref 30.0–36.0)
MCV: 61.9 fL — ABNORMAL LOW (ref 80.0–100.0)
NRBC: 0 % (ref 0.0–0.2)
PLATELETS: 186 10*3/uL (ref 150–400)
RBC: 4.85 MIL/uL (ref 3.87–5.11)
RDW: 19.7 % — ABNORMAL HIGH (ref 11.5–15.5)
WBC: 4.7 10*3/uL (ref 4.0–10.5)

## 2019-01-18 LAB — COMPREHENSIVE METABOLIC PANEL
ALK PHOS: 38 U/L (ref 38–126)
ALT: 10 U/L (ref 0–44)
AST: 17 U/L (ref 15–41)
Albumin: 4.2 g/dL (ref 3.5–5.0)
Anion gap: 7 (ref 5–15)
BUN: 8 mg/dL (ref 6–20)
CO2: 23 mmol/L (ref 22–32)
CREATININE: 0.6 mg/dL (ref 0.44–1.00)
Calcium: 8.8 mg/dL — ABNORMAL LOW (ref 8.9–10.3)
Chloride: 103 mmol/L (ref 98–111)
GFR calc non Af Amer: >60 mL/min (ref >60)
Glucose, Bld: 95 mg/dL (ref 70–99)
Potassium: 4 mmol/L (ref 3.5–5.1)
Sodium: 133 mmol/L — ABNORMAL LOW (ref 135–145)
Total Bilirubin: <0.1 mg/dL — ABNORMAL LOW (ref 0.3–1.2)
Total Protein: 8.4 g/dL — ABNORMAL HIGH (ref 6.5–8.1)

## 2019-01-18 LAB — LIPASE, BLOOD: LIPASE: 31 U/L (ref 11–51)

## 2019-01-18 LAB — AMYLASE: AMYLASE: 71 U/L (ref 28–100)

## 2019-01-18 LAB — POCT PREGNANCY, URINE: Preg Test, Ur: NEGATIVE

## 2019-01-18 MED ORDER — OXYCODONE-ACETAMINOPHEN 5-325 MG PO TABS
2.0000 | ORAL_TABLET | Freq: Four times a day (QID) | ORAL | Status: DC | PRN
  Administered 2019-01-18: 2 via ORAL
  Filled 2019-01-18: qty 2

## 2019-01-18 MED ORDER — ALUM & MAG HYDROXIDE-SIMETH 200-200-20 MG/5ML PO SUSP
30.0000 mL | Freq: Once | ORAL | Status: AC
  Administered 2019-01-18: 30 mL via ORAL
  Filled 2019-01-18: qty 30

## 2019-01-18 MED ORDER — DIPHENHYDRAMINE HCL 25 MG PO CAPS
50.0000 mg | ORAL_CAPSULE | Freq: Once | ORAL | Status: AC
  Administered 2019-01-18: 50 mg via ORAL
  Filled 2019-01-18: qty 2

## 2019-01-18 MED ORDER — IBUPROFEN 800 MG PO TABS: 800.0000 mg | ORAL_TABLET | Freq: Three times a day (TID) | ORAL | 0 refills | Status: DC | PRN

## 2019-01-18 NOTE — Discharge Instructions (Signed)
Abdominal Pain, Adult  Abdominal pain can be caused by many things. Often, abdominal pain is not serious and it gets better with no treatment or by being treated at home. However, sometimes abdominal pain is serious. Your health care provider will do a medical history and a physical exam to try to determine the cause of your abdominal pain.  Follow these instructions at home:   Take over-the-counter and prescription medicines only as told by your health care provider. Do not take a laxative unless told by your health care provider.   Drink enough fluid to keep your urine clear or pale yellow.   Watch your condition for any changes.   Keep all follow-up visits as told by your health care provider. This is important.  Contact a health care provider if:   Your abdominal pain changes or gets worse.   You are not hungry or you lose weight without trying.   You are constipated or have diarrhea for more than 2-3 days.   You have pain when you urinate or have a bowel movement.   Your abdominal pain wakes you up at night.   Your pain gets worse with meals, after eating, or with certain foods.   You are throwing up and cannot keep anything down.   You have a fever.  Get help right away if:   Your pain does not go away as soon as your health care provider told you to expect.   You cannot stop throwing up.   Your pain is only in areas of the abdomen, such as the right side or the left lower portion of the abdomen.   You have bloody or black stools, or stools that look like tar.   You have severe pain, cramping, or bloating in your abdomen.   You have signs of dehydration, such as:  ? Dark urine, very little urine, or no urine.  ? Cracked lips.  ? Dry mouth.  ? Sunken eyes.  ? Sleepiness.  ? Weakness.  This information is not intended to replace advice given to you by your health care provider. Make sure you discuss any questions you have with your health care provider.  Document Released: 09/16/2005 Document  Revised: 06/26/2016 Document Reviewed: 05/20/2016  [employer]vier Interactive Patient Education  2019 [employer]

## 2019-01-18 NOTE — MAU Note (Signed)
Pt C/O severe abdominal pain since last night, starts in upper abdomen & radiates down, took a tramadol but didn't help.   Continues this morning.  Denies vomiting or diarrhea, no fever.

## 2019-01-18 NOTE — MAU Provider Note (Signed)
History     CSN: [id number]  Arrival date and time: 01/18/19 1006   First Provider Initiated Contact with Patient 01/18/19 1251      Chief Complaint  Patient presents with  . Abdominal Pain   HPI Beth Gibson is a 33 y.o. [phone] non-pregnant patient who presents to MAU with chief complaint of periumbilical pain. This is a new problem, onset this morning. Patient endorses 10/10 pain which started 2 finger superior to her umbilicus and radiates straight down to the suprapubic area of her abdomen. She has "leftover" Tramadol from an arm surgery and took that this morning but it did not alleviate her pain. She denies aggravating or alleviating factors. She verbalizes that she had to leave work today due to this pain and she likens the intensity of her pain to childbirth.  Patient denies abnormal vaginal discharge, vaginal bleeding,urinary symptoms, fever or recent illness. Patient endorses "regular" bowel movement last night.  Pertinent Gynecological History: Menses: flow is moderate Bleeding: N/A Contraception: condoms Sexually transmitted diseases: past history: Hx Chlamydia and Trichomonas Last pap: normal per patient Date: Remote   Past Medical History:  Diagnosis Date  . Abscess of axilla, right   . Anemia   . Asthma    NO MEDS, SOB with activity  . BV (bacterial vaginosis)   . Chlamydia   . Headache(784.0)    MIGRAINES  . History of blood transfusion 2013   with child birth  . PONV (postoperative nausea and vomiting)    nausea  . Preterm labor   . Trichomonas   . UTI (lower urinary tract infection)   . Yeast vaginitis     Past Surgical History:  Procedure Laterality Date  . CESAREAN SECTION    . CESAREAN SECTION  04/18/2012   Procedure: CESAREAN SECTION;  Surgeon: Guss Bunde, MD;  Location: [facility];  Service: Gynecology;  Laterality: N/A;  repeat  . cyst removed from L buttock    . CYSTECTOMY  2009   left buttock  . DILATION AND EVACUATION N/A 02/24/2017    Procedure: DILATATION AND EVACUATION;  Surgeon: Lavonia Drafts, MD;  Location: [facility];  Service: Gynecology;  Laterality: N/A;  . HYDRADENITIS EXCISION Right 11/04/2018   Procedure: EXCISION HIDRADENITIS RIGHT  AXILLA;  Surgeon: Jovita Kussmaul, MD;  Location: [facility];  Service: General;  Laterality: Right;  . INCISION AND DRAINAGE ABSCESS Right 11/01/2014   Procedure: INCISION AND DRAINAGE RIGHT AXILLARY ABSCESS;  Surgeon: Leighton Ruff, MD;  Location: [facility];  Service: General;  Laterality: Right;  . INCISION AND DRAINAGE ABSCESS Right 06/21/2018   Procedure: INCISION AND DRAINAGE RIGHT ABSCESS AXILLA;  Surgeon: Jovita Kussmaul, MD;  Location: [facility];  Service: General;  Laterality: Right;  . IRRIGATION AND DEBRIDEMENT ABSCESS Right 08/09/2015   Procedure: IRRIGATION AND DEBRIDEMENT AXILLARY ABSCESS;  Surgeon: Alphonsa Overall, MD;  Location: [facility];  Service: General;  Laterality: Right;  . WISDOM TOOTH EXTRACTION    . WISDOM TOOTH EXTRACTION  2009    Family History  Problem Relation Age of Onset  . Hypertension Mother   . Diabetes Mellitus I Father     Social History   Tobacco Use  . Smoking status: Current Some Day Smoker    Packs/day: 0.25    Years: 0.50    Pack years: 0.12    Types: Cigarettes  . Smokeless tobacco: Never Used  Substance Use Topics  . Alcohol use: No    Alcohol/week: 1.0 standard drinks  Types: 1 Standard drinks or equivalent per week  . Drug use: Not Currently    Allergies:  Allergies  Allergen Reactions  . Vicodin [Hydrocodone-Acetaminophen] Itching    Medications Prior to Admission  Medication Sig Dispense Refill Last Dose  . ibuprofen (ADVIL,MOTRIN) 600 MG tablet Take 1 tablet (600 mg total) by mouth every 6 (six) hours as needed for mild pain.  0 11/01/2018 at Unknown time  . oxyCODONE (OXY IR/ROXICODONE) 5 MG immediate release tablet Take 1-2 tablets (5-10 mg total) by mouth every 6 (six) hours as needed for moderate pain,  severe pain or breakthrough pain. 20 tablet 0   . oxyCODONE-acetaminophen (PERCOCET) 5-325 MG tablet Take 1-2 tablets by mouth every 6 (six) hours as needed for severe pain. 20 tablet 0     Review of Systems  Constitutional: Negative for chills and fever.  Gastrointestinal: Positive for abdominal pain.  Genitourinary: Negative for difficulty urinating, vaginal bleeding, vaginal discharge and vaginal pain.  Musculoskeletal: Negative for back pain.  Neurological: Negative for dizziness and headaches.  All other systems reviewed and are negative.  Physical Exam   Blood pressure 134/82, pulse 95, temperature 97.8 F (36.6 C), temperature source Oral, resp. rate 18, height 5\' 8"  (1.727 m), weight 99.8 kg, last menstrual period 12/30/2018.  Physical Exam  Nursing note and vitals reviewed. Constitutional: She is oriented to person, place, and time. She appears well-developed and well-nourished.  Cardiovascular: Normal rate.  Respiratory: Effort normal. No respiratory distress.  GI: Soft. Bowel sounds are normal. There is abdominal tenderness. There is guarding. There is no rigidity, no rebound, no CVA tenderness, no tenderness at McBurney's point and negative Murphy's sign. No hernia.  Genitourinary:    Genitourinary Comments: Not assessed   Musculoskeletal: Normal range of motion.  Neurological: She is alert and oriented to person, place, and time.  Skin: Skin is warm and dry.  Psychiatric: She has a normal mood and affect. Her behavior is normal. Judgment and thought content normal.    MAU Course/MDM   --Tenderness to palpation and guarding at RLQ.  Patient Vitals for the past 24 hrs:  BP Temp Temp src Pulse Resp Height Weight  01/18/19 1526 (!) 138/92 - - 79 18 - -  01/18/19 1031 134/82 97.8 F (36.6 C) Oral 95 18 5\' 8"  (1.727 m) 99.8 kg    Results for orders placed or performed during the hospital encounter of 01/18/19 (from the past 24 hour(s))  Urinalysis, Routine w reflex  microscopic     Status: None   Collection Time: 01/18/19 11:04 AM  Result Value Ref Range   Color, Urine YELLOW YELLOW   APPearance CLEAR CLEAR   Specific Gravity, Urine 1.017 1.005 - 1.030   pH 6.0 5.0 - 8.0   Glucose, UA NEGATIVE NEGATIVE mg/dL   Hgb urine dipstick NEGATIVE NEGATIVE   Bilirubin Urine NEGATIVE NEGATIVE   Ketones, ur NEGATIVE NEGATIVE mg/dL   Protein, ur NEGATIVE NEGATIVE mg/dL   Nitrite NEGATIVE NEGATIVE   Leukocytes, UA NEGATIVE NEGATIVE  Pregnancy, urine POC     Status: None   Collection Time: 01/18/19 11:13 AM  Result Value Ref Range   Preg Test, Ur NEGATIVE NEGATIVE  CBC     Status: Abnormal   Collection Time: 01/18/19  1:05 PM  Result Value Ref Range   WBC 4.7 4.0 - 10.5 K/uL   RBC 4.85 3.87 - 5.11 MIL/uL   Hemoglobin 8.6 (L) 12.0 - 15.0 g/dL   HCT 30.0 (L) 36.0 - 46.0 %  MCV 61.9 (L) 80.0 - 100.0 fL   MCH 17.7 (L) 26.0 - 34.0 pg   MCHC 28.7 (L) 30.0 - 36.0 g/dL   RDW 19.7 (H) 11.5 - 15.5 %   Platelets 186 150 - 400 K/uL   nRBC 0.0 0.0 - 0.2 %  Comprehensive metabolic panel     Status: Abnormal   Collection Time: 01/18/19  1:05 PM  Result Value Ref Range   Sodium 133 (L) 135 - 145 mmol/L   Potassium 4.0 3.5 - 5.1 mmol/L   Chloride 103 98 - 111 mmol/L   CO2 23 22 - 32 mmol/L   Glucose, Bld 95 70 - 99 mg/dL   BUN 8 6 - 20 mg/dL   Creatinine, Ser 0.60 0.44 - 1.00 mg/dL   Calcium 8.8 (L) 8.9 - 10.3 mg/dL   Total Protein 8.4 (H) 6.5 - 8.1 g/dL   Albumin 4.2 3.5 - 5.0 g/dL   AST 17 15 - 41 U/L   ALT 10 0 - 44 U/L   Alkaline Phosphatase 38 38 - 126 U/L   Total Bilirubin <0.1 (L) 0.3 - 1.2 mg/dL   GFR calc non Af Amer >60 >60 mL/min   GFR calc Af Amer >60 >60 mL/min   Anion gap 7 5 - 15  Amylase     Status: None   Collection Time: 01/18/19  1:05 PM  Result Value Ref Range   Amylase 71 28 - 100 U/L   US Abdomen Complete  Result Date: 01/18/2019 CLINICAL DATA:  Acute onset of severe abdominal pain today. EXAM: ABDOMEN ULTRASOUND COMPLETE  COMPARISON:  None. FINDINGS: Gallbladder: No gallstones or wall thickening visualized. No sonographic Murphy sign noted by sonographer. Common bile duct: Diameter: 3 mm, within normal limits. Liver: No focal lesion identified. Within normal limits in parenchymal echogenicity. Portal vein is patent on color Doppler imaging with normal direction of blood flow towards the liver. IVC: No abnormality visualized. Pancreas: Not well visualized due to overlying bowel gas. Spleen: Size and appearance within normal limits. Right Kidney: Length: 12.2 cm. Echogenicity within normal limits. No mass or hydronephrosis visualized. Left Kidney: Length: 12.1 cm. Echogenicity within normal limits. No mass or hydronephrosis visualized. Abdominal aorta: No aneurysm visualized. Other findings: None. IMPRESSION: Negative abdomen ultrasound. Electronically Signed   By: Earle Gell M.D.   On: 01/18/2019 15:17    Meds ordered this encounter  Medications  . oxyCODONE-acetaminophen (PERCOCET/ROXICET) 5-325 MG per tablet 2 tablet    Patient has previously had Percocet without allergic reaction  . diphenhydrAMINE (BENADRYL) capsule 50 mg  . alum & mag hydroxide-simeth (MAALOX/MYLANTA) 200-200-20 MG/5ML suspension 30 mL  . ibuprofen (ADVIL,MOTRIN) 800 MG tablet    Sig: Take 1 tablet (800 mg total) by mouth every 8 (eight) hours as needed.    Dispense:  30 tablet    Refill:  0    Order Specific Question:   Supervising Provider    Answer:   Donnamae Jude [[id number]]   Assessment and Plan  --33 y.o. [phone] non-pregnant patient --Abdominal pain, acute --No concerning findings on imaging or labs collected today --Lipase in process at time of discharge. Patient states she is unable to wait for result but is amenable to phone call at home if abnormal. --Discharge home in stable condition  F/U: Closest ED or Urgent Care. Patient has appt with PCP in three weeks  Darlina Rumpf, CNM 01/18/2019, 3:38 PM

## 2019-02-28 ENCOUNTER — Other Ambulatory Visit: Payer: Self-pay

## 2019-02-28 ENCOUNTER — Encounter (HOSPITAL_COMMUNITY): Payer: Self-pay

## 2019-02-28 ENCOUNTER — Ambulatory Visit (HOSPITAL_COMMUNITY)
Admission: EM | Admit: 2019-02-28 | Discharge: 2019-02-28 | Disposition: A | Discharge status: Home / Self Care | Payer: Medicaid Other | Attending: Family Medicine | Admitting: Family Medicine

## 2019-02-28 DIAGNOSIS — K529 Noninfective gastroenteritis and colitis, unspecified: Secondary | ICD-10-CM

## 2019-02-28 MED ORDER — ONDANSETRON 4 MG PO TBDP: 4.0000 mg | ORAL_TABLET | Freq: Three times a day (TID) | ORAL | 0 refills | Status: DC | PRN

## 2019-02-28 MED ORDER — ONDANSETRON 4 MG PO TBDP
4.0000 mg | ORAL_TABLET | Freq: Once | ORAL | Status: AC
  Administered 2019-02-28: 4 mg via ORAL

## 2019-02-28 MED ORDER — ONDANSETRON 4 MG PO TBDP
ORAL_TABLET | ORAL | Status: AC
  Filled 2019-02-28: qty 1

## 2019-02-28 NOTE — ED Triage Notes (Signed)
Pt  Cc vomiting , body aches  and chills this started yesterday.

## 2019-02-28 NOTE — Discharge Instructions (Addendum)
This is probably a stomach virus Zofran for nausea, vomiting Sip fluids to stay hydrated and advance diet as tolerated Follow up as needed for continued or worsening symptoms

## 2019-03-02 NOTE — ED Provider Notes (Signed)
[facility]    CSN: [id number] Arrival date & time: 02/28/19  4098     History   Chief Complaint Chief Complaint  Patient presents with  . Emesis    HPI Beth Gibson is a 33 y.o. female.   Patient is a 33 year old female presents with vomiting, body aches, chills that started yesterday.  Her symptoms have been constant and remain the same.  She has not had anything for symptoms.  She is having some generalized abdominal discomfort and cramping.  No diarrhea.  No fevers.  No recent traveling or sick contacts.  She has been sipping fluids. No appetite.  Denies any respiratory symptoms  ROS per HPI    Emesis    Past Medical History:  Diagnosis Date  . Abscess of axilla, right   . Anemia   . Asthma    NO MEDS, SOB with activity  . BV (bacterial vaginosis)   . Chlamydia   . Headache(784.0)    MIGRAINES  . History of blood transfusion 2013   with child birth  . PONV (postoperative nausea and vomiting)    nausea  . Preterm labor   . Trichomonas   . UTI (lower urinary tract infection)   . Yeast vaginitis     Patient Active Problem List   Diagnosis Date Noted  . Abscess of right axilla 06/20/2018  . Incomplete spontaneous abortion 02/24/2017  . Status post elective abortion 12/10/2016  . Severe episode of recurrent major depressive disorder, with psychotic features ([place])   . MDD (major depressive disorder) 03/16/2016  . Microcytic anemia 11/01/2014  . Hyperkalemia 11/01/2014  . Axillary abscess 10/31/2014  . Obesity (BMI 30-39.9) 12/10/2011  . Previous cesarean delivery, antepartum condition or complication 11/91/4782    Past Surgical History:  Procedure Laterality Date  . CESAREAN SECTION    . CESAREAN SECTION  04/18/2012   Procedure: CESAREAN SECTION;  Surgeon: Guss Bunde, MD;  Location: [facility];  Service: Gynecology;  Laterality: N/A;  repeat  . cyst removed from L buttock    . CYSTECTOMY  2009   left buttock  . DILATION AND EVACUATION  N/A 02/24/2017   Procedure: DILATATION AND EVACUATION;  Surgeon: Lavonia Drafts, MD;  Location: [facility];  Service: Gynecology;  Laterality: N/A;  . HYDRADENITIS EXCISION Right 11/04/2018   Procedure: EXCISION HIDRADENITIS RIGHT  AXILLA;  Surgeon: Jovita Kussmaul, MD;  Location: [facility];  Service: General;  Laterality: Right;  . INCISION AND DRAINAGE ABSCESS Right 11/01/2014   Procedure: INCISION AND DRAINAGE RIGHT AXILLARY ABSCESS;  Surgeon: Leighton Ruff, MD;  Location: [facility];  Service: General;  Laterality: Right;  . INCISION AND DRAINAGE ABSCESS Right 06/21/2018   Procedure: INCISION AND DRAINAGE RIGHT ABSCESS AXILLA;  Surgeon: Jovita Kussmaul, MD;  Location: [facility];  Service: General;  Laterality: Right;  . IRRIGATION AND DEBRIDEMENT ABSCESS Right 08/09/2015   Procedure: IRRIGATION AND DEBRIDEMENT AXILLARY ABSCESS;  Surgeon: Alphonsa Overall, MD;  Location: [facility];  Service: General;  Laterality: Right;  . WISDOM TOOTH EXTRACTION    . WISDOM TOOTH EXTRACTION  2009    OB History    Gravida  4   Para  2   Term  0   Preterm  2   AB  2   Living  2     SAB  1   TAB  0   Ectopic  0   Multiple  0   Live Births  2  Home Medications    Prior to Admission medications   Medication Sig Start Date End Date Taking? Authorizing Provider  ibuprofen (ADVIL,MOTRIN) 800 MG tablet Take 1 tablet (800 mg total) by mouth every 8 (eight) hours as needed. 01/18/19   Darlina Rumpf, CNM  ondansetron (ZOFRAN ODT) 4 MG disintegrating tablet Take 1 tablet (4 mg total) by mouth every 8 (eight) hours as needed for nausea or vomiting. 02/28/19   Orvan July, NP    Family History Family History  Problem Relation Age of Onset  . Hypertension Mother   . Diabetes Mellitus I Father     Social History Social History   Tobacco Use  . Smoking status: Current Some Day Smoker    Packs/day: 0.25    Years: 0.50    Pack years: 0.12    Types: Cigarettes  .  Smokeless tobacco: Never Used  Substance Use Topics  . Alcohol use: No    Alcohol/week: 1.0 standard drinks    Types: 1 Standard drinks or equivalent per week  . Drug use: Not Currently     Allergies   Vicodin [hydrocodone-acetaminophen]   Review of Systems Review of Systems  Gastrointestinal: Positive for vomiting.     Physical Exam Triage Vital Signs ED Triage Vitals  Enc Vitals Group     BP 02/28/19 0902 131/77     Pulse Rate 02/28/19 0902 98     Resp 02/28/19 0902 18     Temp 02/28/19 0902 98.1 F (36.7 C)     Temp Source 02/28/19 0902 Oral     SpO2 02/28/19 0902 100 %     Weight 02/28/19 0907 210 lb (95.3 kg)     Height --      Head Circumference --      Peak Flow --      Pain Score 02/28/19 0948 10     Pain Loc --      Pain Edu? --      Excl. in [place]? --    No data found.  Updated Vital Signs BP 131/77 (BP Location: Left Arm)   Pulse 98   Temp 98.1 F (36.7 C) (Oral)   Resp 18   Wt 210 lb (95.3 kg)   LMP 02/01/2019   SpO2 100%   BMI 31.93 kg/m   Visual Acuity Right Eye Distance:   Left Eye Distance:   Bilateral Distance:    Right Eye Near:   Left Eye Near:    Bilateral Near:     Physical Exam Vitals signs and nursing note reviewed.  Constitutional:      General: She is not in acute distress.    Appearance: Normal appearance. She is normal weight. She is not ill-appearing, toxic-appearing or diaphoretic.  HENT:     Head: Normocephalic and atraumatic.     Nose: Nose normal.  Eyes:     Conjunctiva/sclera: Conjunctivae normal.  Neck:     Musculoskeletal: Normal range of motion and neck supple.  Cardiovascular:     Rate and Rhythm: Normal rate and regular rhythm.     Pulses: Normal pulses.     Heart sounds: Normal heart sounds.  Pulmonary:     Effort: Pulmonary effort is normal.     Breath sounds: Normal breath sounds.  Abdominal:     Palpations: Abdomen is soft.     Comments: Generalized abdominal tenderness  Musculoskeletal: Normal  range of motion.  Lymphadenopathy:     Cervical: No cervical adenopathy.  Skin:  General: Skin is warm and dry.  Neurological:     Mental Status: She is alert.  Psychiatric:        Mood and Affect: Mood normal.      UC Treatments / Results  Labs (all labs ordered are listed, but only abnormal results are displayed) Labs Reviewed - No data to display  EKG None  Radiology No results found.  Procedures Procedures (including critical care time)  Medications Ordered in UC Medications  ondansetron (ZOFRAN-ODT) disintegrating tablet 4 mg (4 mg Oral Given 02/28/19 0947)    Initial Impression / Assessment and Plan / UC Course  I have reviewed the triage vital signs and the nursing notes.  Pertinent labs & imaging results that were available during my care of the patient were reviewed by me and considered in my medical decision making (see chart for details).     Symptoms consistent with gastroenteritis Zofran for nausea vomiting as needed Recommended sipping fluids stay hydrated and advance diet as tolerated Follow up as needed for continued or worsening symptoms  Final Clinical Impressions(s) / UC Diagnoses   Final diagnoses:  Gastroenteritis     Discharge Instructions     This is probably a stomach virus Zofran for nausea, vomiting Sip fluids to stay hydrated and advance diet as tolerated Follow up as needed for continued or worsening symptoms     ED Prescriptions    Medication Sig Dispense Auth. Provider   ondansetron (ZOFRAN ODT) 4 MG disintegrating tablet Take 1 tablet (4 mg total) by mouth every 8 (eight) hours as needed for nausea or vomiting. 20 tablet Loura Halt A, NP     Controlled Substance Prescriptions [place] Controlled Substance Registry consulted? Not Applicable   Orvan July, NP 03/02/19 1550

## 2019-06-22 ENCOUNTER — Telehealth (HOSPITAL_COMMUNITY): Payer: Self-pay

## 2019-07-13 ENCOUNTER — Encounter (HOSPITAL_COMMUNITY): Payer: Self-pay | Admitting: Psychiatry

## 2019-07-13 ENCOUNTER — Other Ambulatory Visit: Payer: Self-pay

## 2019-07-13 ENCOUNTER — Ambulatory Visit (HOSPITAL_COMMUNITY): Payer: Medicaid Other | Admitting: Psychiatry

## 2019-09-08 ENCOUNTER — Inpatient Hospital Stay (HOSPITAL_COMMUNITY)
Admission: AD | Admit: 2019-09-08 | Discharge: 2019-09-08 | Disposition: A | Discharge status: Home / Self Care | Payer: Medicaid Other | Attending: Obstetrics and Gynecology | Admitting: Obstetrics and Gynecology

## 2019-09-08 ENCOUNTER — Encounter (HOSPITAL_COMMUNITY): Payer: Self-pay | Admitting: *Deleted

## 2019-09-08 ENCOUNTER — Other Ambulatory Visit: Payer: Self-pay

## 2019-09-08 ENCOUNTER — Inpatient Hospital Stay (HOSPITAL_COMMUNITY): Payer: Medicaid Other

## 2019-09-08 ENCOUNTER — Other Ambulatory Visit: Payer: Self-pay | Admitting: Certified Nurse Midwife

## 2019-09-08 DIAGNOSIS — Z8249 Family history of ischemic heart disease and other diseases of the circulatory system: Secondary | ICD-10-CM | POA: Diagnosis not present

## 2019-09-08 DIAGNOSIS — O99011 Anemia complicating pregnancy, first trimester: Secondary | ICD-10-CM

## 2019-09-08 DIAGNOSIS — O23591 Infection of other part of genital tract in pregnancy, first trimester: Secondary | ICD-10-CM | POA: Insufficient documentation

## 2019-09-08 DIAGNOSIS — R109 Unspecified abdominal pain: Secondary | ICD-10-CM | POA: Insufficient documentation

## 2019-09-08 DIAGNOSIS — O26891 Other specified pregnancy related conditions, first trimester: Secondary | ICD-10-CM | POA: Insufficient documentation

## 2019-09-08 DIAGNOSIS — O3680X0 Pregnancy with inconclusive fetal viability, not applicable or unspecified: Secondary | ICD-10-CM | POA: Diagnosis not present

## 2019-09-08 DIAGNOSIS — J45909 Unspecified asthma, uncomplicated: Secondary | ICD-10-CM | POA: Diagnosis not present

## 2019-09-08 DIAGNOSIS — O98811 Other maternal infectious and parasitic diseases complicating pregnancy, first trimester: Secondary | ICD-10-CM

## 2019-09-08 DIAGNOSIS — Z3A01 Less than 8 weeks gestation of pregnancy: Secondary | ICD-10-CM

## 2019-09-08 DIAGNOSIS — O99511 Diseases of the respiratory system complicating pregnancy, first trimester: Secondary | ICD-10-CM | POA: Insufficient documentation

## 2019-09-08 DIAGNOSIS — B9689 Other specified bacterial agents as the cause of diseases classified elsewhere: Secondary | ICD-10-CM | POA: Insufficient documentation

## 2019-09-08 DIAGNOSIS — O26899 Other specified pregnancy related conditions, unspecified trimester: Secondary | ICD-10-CM

## 2019-09-08 DIAGNOSIS — O99331 Smoking (tobacco) complicating pregnancy, first trimester: Secondary | ICD-10-CM | POA: Insufficient documentation

## 2019-09-08 DIAGNOSIS — N760 Acute vaginitis: Secondary | ICD-10-CM

## 2019-09-08 LAB — URINALYSIS, ROUTINE W REFLEX MICROSCOPIC
Bilirubin Urine: NEGATIVE
Glucose, UA: NEGATIVE mg/dL
Hgb urine dipstick: NEGATIVE
Ketones, ur: NEGATIVE mg/dL
Nitrite: NEGATIVE
Protein, ur: NEGATIVE mg/dL
Specific Gravity, Urine: 1.021 (ref 1.005–1.030)
pH: 6 (ref 5.0–8.0)

## 2019-09-08 LAB — POCT PREGNANCY, URINE: Preg Test, Ur: POSITIVE — AB

## 2019-09-08 LAB — WET PREP, GENITAL
Clue Cells Wet Prep HPF POC: NONE SEEN
Sperm: NONE SEEN
Trich, Wet Prep: NONE SEEN
Yeast Wet Prep HPF POC: NONE SEEN

## 2019-09-08 LAB — CBC
HCT: 29.9 % — ABNORMAL LOW (ref 36.0–46.0)
Hemoglobin: 8.7 g/dL — ABNORMAL LOW (ref 12.0–15.0)
MCH: 19.3 pg — ABNORMAL LOW (ref 26.0–34.0)
MCHC: 29.1 g/dL — ABNORMAL LOW (ref 30.0–36.0)
MCV: 66.3 fL — ABNORMAL LOW (ref 80.0–100.0)
Platelets: 224 10*3/uL (ref 150–400)
RBC: 4.51 MIL/uL (ref 3.87–5.11)
RDW: 21.2 % — ABNORMAL HIGH (ref 11.5–15.5)
WBC: 5.2 10*3/uL (ref 4.0–10.5)
nRBC: 0 % (ref 0.0–0.2)

## 2019-09-08 LAB — HCG, QUANTITATIVE, PREGNANCY: hCG, Beta Chain, Quant, S: 4029 m[IU]/mL — ABNORMAL HIGH (ref <5)

## 2019-09-08 MED ORDER — ACETAMINOPHEN 500 MG PO TABS
1000.0000 mg | ORAL_TABLET | Freq: Four times a day (QID) | ORAL | Status: DC | PRN
  Administered 2019-09-08: 15:00:00 1000 mg via ORAL
  Filled 2019-09-08: qty 2

## 2019-09-08 MED ORDER — METRONIDAZOLE 500 MG PO TABS: 500.0000 mg | ORAL_TABLET | Freq: Two times a day (BID) | ORAL | 0 refills | Status: DC

## 2019-09-08 NOTE — MAU Note (Addendum)
.   Beth Gibson is a 33 y.o. at [redacted]w[redacted]d here in MAU reporting: lower abdominal cramping that radiates to her back    that started last week and thought it was her cycle getting ready to start  PT took a pregnancy test and it was positive, pt says the pain is getting worse LMP: 08/03/19 Onset of complaint: week ago Pain score: 10 Vitals:   09/08/19 1342  BP: (!) 130/96  Pulse: 80  Resp: 16  Temp: 98 F (36.7 C)  SpO2: 100%   Lab orders placed from triage: UA/UPT

## 2019-09-08 NOTE — Discharge Instructions (Signed)
Abdominal Pain During Pregnancy ° °Belly (abdominal) pain is common during pregnancy. There are many possible causes. Most of the time, it is not a serious problem. Other times, it can be a sign that something is wrong with the pregnancy. Always tell your doctor if you have belly pain. °Follow these instructions at home: °· Do not have sex or put anything in your vagina until your pain goes away completely. °· Get plenty of rest until your pain gets better. °· Drink enough fluid to keep your pee (urine) pale yellow. °· Take over-the-counter and prescription medicines only as told by your doctor. °· Keep all follow-up visits as told by your doctor. This is important. °Contact a doctor if: °· Your pain continues or gets worse after resting. °· You have lower belly pain that: °? Comes and goes at regular times. °? Spreads to your back. °? Feels like menstrual cramps. °· You have pain or burning when you pee (urinate). °Get help right away if: °· You have a fever or chills. °· You have vaginal bleeding. °· You are leaking fluid from your vagina. °· You are passing tissue from your vagina. °· You throw up (vomit) for more than 24 hours. °· You have watery poop (diarrhea) for more than 24 hours. °· Your baby is moving less than usual. °· You feel very weak or faint. °· You have shortness of breath. °· You have very bad pain in your upper belly. °Summary °· Belly (abdominal) pain is common during pregnancy. There are many possible causes. °· If you have belly pain during pregnancy, tell your doctor right away. °· Keep all follow-up visits as told by your doctor. This is important. °This information is not intended to replace advice given to you by your health care provider. Make sure you discuss any questions you have with your health care provider. °Document Released: 11/25/2009 Document Revised: 03/27/2019 Document Reviewed: 03/11/2017 °Elsevier Patient Education © 2020 [employer] ° °

## 2019-09-08 NOTE — MAU Provider Note (Addendum)
History     CSN: [id number]  Arrival date and time: 09/08/19 1257   First Provider Initiated Contact with Patient 09/08/19 1438      Chief Complaint  Patient presents with  . Abdominal Pain  . Possible Pregnancy   [id number] @[redacted]w[redacted]d  by LMP presenting with LAP. Pain started 8 days ago. Pain is bilateral and central. Rates 10/10. Describes as sharp and intermittent. Denies VB but reports yellow malodorous vaginal discharge. No urinary sx. No GI sx. Also reports LBP. No recent injury but reports lifting at work.   OB History    Gravida  5   Para  2   Term  0   Preterm  2   AB  2   Living  2     SAB  1   TAB  0   Ectopic  0   Multiple  0   Live Births  2           Past Medical History:  Diagnosis Date  . Abscess of axilla, right   . Anemia   . Asthma    NO MEDS, SOB with activity  . BV (bacterial vaginosis)   . Chlamydia   . Depression   . Headache(784.0)    MIGRAINES  . History of blood transfusion 2013   with child birth  . PONV (postoperative nausea and vomiting)    nausea  . Preterm labor   . Trichomonas   . UTI (lower urinary tract infection)   . Yeast vaginitis     Past Surgical History:  Procedure Laterality Date  . CESAREAN SECTION    . CESAREAN SECTION  04/18/2012   Procedure: CESAREAN SECTION;  Surgeon: Guss Bunde, MD;  Location: [facility];  Service: Gynecology;  Laterality: N/A;  repeat  . cyst removed from L buttock    . CYSTECTOMY  2009   left buttock  . DILATION AND EVACUATION N/A 02/24/2017   Procedure: DILATATION AND EVACUATION;  Surgeon: Lavonia Drafts, MD;  Location: [facility];  Service: Gynecology;  Laterality: N/A;  . HYDRADENITIS EXCISION Right 11/04/2018   Procedure: EXCISION HIDRADENITIS RIGHT  AXILLA;  Surgeon: Jovita Kussmaul, MD;  Location: [facility];  Service: General;  Laterality: Right;  . INCISION AND DRAINAGE ABSCESS Right 11/01/2014   Procedure: INCISION AND DRAINAGE RIGHT AXILLARY ABSCESS;   Surgeon: Leighton Ruff, MD;  Location: [facility];  Service: General;  Laterality: Right;  . INCISION AND DRAINAGE ABSCESS Right 06/21/2018   Procedure: INCISION AND DRAINAGE RIGHT ABSCESS AXILLA;  Surgeon: Jovita Kussmaul, MD;  Location: [facility];  Service: General;  Laterality: Right;  . IRRIGATION AND DEBRIDEMENT ABSCESS Right 08/09/2015   Procedure: IRRIGATION AND DEBRIDEMENT AXILLARY ABSCESS;  Surgeon: Alphonsa Overall, MD;  Location: [facility];  Service: General;  Laterality: Right;  . WISDOM TOOTH EXTRACTION    . WISDOM TOOTH EXTRACTION  2009    Family History  Problem Relation Age of Onset  . Hypertension Mother   . Diabetes Mellitus I Father     Social History   Tobacco Use  . Smoking status: Current Some Day Smoker    Packs/day: 0.25    Years: 0.50    Pack years: 0.12    Types: Cigarettes  . Smokeless tobacco: Never Used  Substance Use Topics  . Alcohol use: No    Alcohol/week: 1.0 standard drinks    Types: 1 Standard drinks or equivalent per week  . Drug use: Yes    Types: Marijuana  Comment: Last use-08/2019    Allergies:  Allergies  Allergen Reactions  . Vicodin [Hydrocodone-Acetaminophen] Itching    Pt tolerates percocet    No medications prior to admission.    Review of Systems  Constitutional: Negative for chills and fever.  Gastrointestinal: Positive for abdominal pain. Negative for constipation, diarrhea, nausea and vomiting.  Genitourinary: Positive for vaginal discharge. Negative for dysuria, frequency, urgency and vaginal bleeding.  Musculoskeletal: Positive for back pain.   Physical Exam   Blood pressure 134/89, pulse 83, temperature 98.1 F (36.7 C), resp. rate 16, last menstrual period 08/03/2019, SpO2 100 %.  Physical Exam  Nursing note and vitals reviewed. Constitutional: She is oriented to person, place, and time. She appears well-developed and well-nourished. No distress.  HENT:  Head: Normocephalic and atraumatic.  Neck: Normal range of motion.   Cardiovascular: Normal rate.  Respiratory: Effort normal. No respiratory distress.  GI: Soft. She exhibits no distension and no mass. There is abdominal tenderness in the right lower quadrant, suprapubic area and left lower quadrant. There is no rebound and no guarding.  Genitourinary:    Genitourinary Comments: External: no lesions or erythema Vagina: rugated, pink, moist, thick yellow discharge Uterus: non enlarged, anteverted, non tender, no CMT Adnexae: no masses, no tenderness left, no tenderness right Cervix closed    Musculoskeletal: Normal range of motion.  Neurological: She is alert and oriented to person, place, and time.  Skin: Skin is warm and dry.  Psychiatric: She has a normal mood and affect.   Results for orders placed or performed during the hospital encounter of 09/08/19 (from the past 24 hour(s))  Pregnancy, urine POC     Status: Abnormal   Collection Time: 09/08/19  1:14 PM  Result Value Ref Range   Preg Test, Ur POSITIVE (A) NEGATIVE  Urinalysis, Routine w reflex microscopic     Status: Abnormal   Collection Time: 09/08/19  1:30 PM  Result Value Ref Range   Color, Urine YELLOW YELLOW   APPearance HAZY (A) CLEAR   Specific Gravity, Urine 1.021 1.005 - 1.030   pH 6.0 5.0 - 8.0   Glucose, UA NEGATIVE NEGATIVE mg/dL   Hgb urine dipstick NEGATIVE NEGATIVE   Bilirubin Urine NEGATIVE NEGATIVE   Ketones, ur NEGATIVE NEGATIVE mg/dL   Protein, ur NEGATIVE NEGATIVE mg/dL   Nitrite NEGATIVE NEGATIVE   Leukocytes,Ua LARGE (A) NEGATIVE   RBC / HPF 6-10 0 - 5 RBC/hpf   WBC, UA 6-10 0 - 5 WBC/hpf   Bacteria, UA RARE (A) NONE SEEN   Squamous Epithelial / LPF 21-50 0 - 5   Mucus PRESENT    Hyaline Casts, UA PRESENT    Uric Acid Crys, UA PRESENT   CBC     Status: Abnormal   Collection Time: 09/08/19  2:05 PM  Result Value Ref Range   WBC 5.2 4.0 - 10.5 K/uL   RBC 4.51 3.87 - 5.11 MIL/uL   Hemoglobin 8.7 (L) 12.0 - 15.0 g/dL   HCT 29.9 (L) 36.0 - 46.0 %   MCV 66.3  (L) 80.0 - 100.0 fL   MCH 19.3 (L) 26.0 - 34.0 pg   MCHC 29.1 (L) 30.0 - 36.0 g/dL   RDW 21.2 (H) 11.5 - 15.5 %   Platelets 224 150 - 400 K/uL   nRBC 0.0 0.0 - 0.2 %  hCG, quantitative, pregnancy     Status: Abnormal   Collection Time: 09/08/19  2:05 PM  Result Value Ref Range   hCG, Beta Chain,  Quant, S 4,029 (H) <5 mIU/mL  Wet prep, genital     Status: Abnormal   Collection Time: 09/08/19  2:50 PM   Specimen: Cervix  Result Value Ref Range   Yeast Wet Prep HPF POC NONE SEEN NONE SEEN   Trich, Wet Prep NONE SEEN NONE SEEN   Clue Cells Wet Prep HPF POC NONE SEEN NONE SEEN   WBC, Wet Prep HPF POC MANY (A) NONE SEEN   Sperm NONE SEEN    US Ob Less Than 14 Weeks With Ob Transvaginal  Result Date: 09/08/2019 CLINICAL DATA:  Abdominal pain EXAM: OBSTETRIC <14 WK [place] AND TRANSVAGINAL OB US TECHNIQUE: Both transabdominal and transvaginal ultrasound examinations were performed for complete evaluation of the gestation as well as the maternal uterus, adnexal regions, and pelvic cul-de-sac. Transvaginal technique was performed to assess early pregnancy. COMPARISON:  None. FINDINGS: Intrauterine gestational sac: Single Yolk sac:  Not Visualized. Embryo:  Not Visualized. Cardiac Activity: Not Visualized. MSD: 4.4 mm   5 w   1 d [place] EDC: 05/09/2020 Subchorionic hemorrhage:  None visualized. Maternal uterus/adnexae: Within normal limits. Trace free fluid in the cul-de-sac. IMPRESSION: Probable early intrauterine gestational sac, but no yolk sac, fetal pole, or cardiac activity yet visualized. Recommend follow-up quantitative B-HCG levels and follow-up US in 14 days to assess viability. This recommendation follows SRU consensus guidelines: Diagnostic Criteria for Nonviable Pregnancy Early in the First Trimester. [employer] Med 2013KT:048977. Electronically Signed   By: Davina Poke M.D.   On: 09/08/2019 15:23   MAU Course  Procedures  MDM Labs and [place] ordered and reviewed. Will treat for BV. IUGS seen  but no YS or FP on [place], findings likely indicate early pregnancy, but cannot r/o ectopic pregnancy, or failed pregnancy-discussed with pt. Will follow quant in 48 hrs.  Assessment and Plan   1. Pregnancy, location unknown   2. Abdominal pain in pregnancy   3. Bacterial vaginosis   4. Anemia affecting pregnancy in first trimester    Discharge home Follow up in MAU on 9/20 for qhcg Ectopic/SAB precautions Rx Flagyl  Allergies as of 09/08/2019      Reactions   Vicodin [hydrocodone-acetaminophen] Itching   Pt tolerates percocet      Medication List    STOP taking these medications   ibuprofen 800 MG tablet Commonly known as: ADVIL   ondansetron 4 MG disintegrating tablet Commonly known as: Zofran ODT     TAKE these medications   metroNIDAZOLE 500 MG tablet Commonly known as: FLAGYL Take 1 tablet (500 mg total) by mouth 2 (two) times daily.      Julianne Handler, CNM 09/08/2019, 3:57 PM

## 2019-09-09 LAB — CERVICOVAGINAL ANCILLARY ONLY
Chlamydia: POSITIVE — AB
Neisseria Gonorrhea: POSITIVE — AB

## 2019-09-11 ENCOUNTER — Encounter: Payer: Self-pay | Admitting: Certified Nurse Midwife

## 2019-09-15 ENCOUNTER — Telehealth (HOSPITAL_COMMUNITY): Payer: Self-pay

## 2019-09-15 NOTE — Telephone Encounter (Signed)
Spoke with pt. STI results reviewed, she verbalized understanding of results, treatment and prevention.  Discussed options/places to receive treatment and her partner will also need to be tested and treated.  She verbalized understanding and all questions answered.  Results faxed to [facility]

## 2019-09-18 ENCOUNTER — Inpatient Hospital Stay (HOSPITAL_COMMUNITY)
Admission: AD | Admit: 2019-09-18 | Discharge: 2019-09-18 | Disposition: A | Discharge status: Home / Self Care | Payer: Medicaid Other | Attending: Obstetrics & Gynecology | Admitting: Obstetrics & Gynecology

## 2019-09-18 ENCOUNTER — Other Ambulatory Visit: Payer: Self-pay

## 2019-09-18 ENCOUNTER — Inpatient Hospital Stay (HOSPITAL_COMMUNITY): Payer: Medicaid Other

## 2019-09-18 DIAGNOSIS — O3680X0 Pregnancy with inconclusive fetal viability, not applicable or unspecified: Secondary | ICD-10-CM

## 2019-09-18 DIAGNOSIS — Z3A01 Less than 8 weeks gestation of pregnancy: Secondary | ICD-10-CM | POA: Diagnosis not present

## 2019-09-18 DIAGNOSIS — A549 Gonococcal infection, unspecified: Secondary | ICD-10-CM | POA: Insufficient documentation

## 2019-09-18 DIAGNOSIS — O98811 Other maternal infectious and parasitic diseases complicating pregnancy, first trimester: Secondary | ICD-10-CM | POA: Diagnosis not present

## 2019-09-18 DIAGNOSIS — Z3687 Encounter for antenatal screening for uncertain dates: Secondary | ICD-10-CM | POA: Diagnosis not present

## 2019-09-18 DIAGNOSIS — O98211 Gonorrhea complicating pregnancy, first trimester: Secondary | ICD-10-CM | POA: Diagnosis not present

## 2019-09-18 DIAGNOSIS — A749 Chlamydial infection, unspecified: Secondary | ICD-10-CM | POA: Insufficient documentation

## 2019-09-18 LAB — HCG, QUANTITATIVE, PREGNANCY: hCG, Beta Chain, Quant, S: 30792 m[IU]/mL — ABNORMAL HIGH (ref <5)

## 2019-09-18 MED ORDER — AZITHROMYCIN 250 MG PO TABS
1000.0000 mg | ORAL_TABLET | Freq: Once | ORAL | Status: AC
  Administered 2019-09-18: 1000 mg via ORAL
  Filled 2019-09-18: qty 4

## 2019-09-18 MED ORDER — LIDOCAINE HCL (PF) 1 % IJ SOLN
1.0000 mL | INTRAMUSCULAR | Status: AC
  Administered 2019-09-18: 11:00:00 1 mL
  Filled 2019-09-18: qty 5

## 2019-09-18 MED ORDER — CEFTRIAXONE SODIUM 250 MG IJ SOLR
250.0000 mg | Freq: Once | INTRAMUSCULAR | Status: AC
  Administered 2019-09-18: 250 mg via INTRAMUSCULAR
  Filled 2019-09-18: qty 250

## 2019-09-18 NOTE — Discharge Instructions (Signed)
Chlamydia Test Why am I having this test? Chlamydia is a common STI (sexually transmitted infection), especially in people younger than 31 years of age. Chlamydia may be associated with other STIs, such as gonorrhea. Your health care provider may test you for chlamydia if you:  Are sexually active.  Have another STI.  Have possible symptoms of chlamydia infection, such as pelvic pain or vaginal discharge. What is being tested? This test checks for the presence of the bacteria Chlamydia trachomatis, which causes chlamydia infection. What kind of sample is taken?     Depending on your symptoms, your health care provider may collect one of the following samples:  A urine sample. This is collected in a germ-free (sterile) container that is given to you by your health care provider or the lab where the urine will be examined and tested.  A fluid sample. This may be collected by swabbing one of the following: ? The vagina or the lower part of the womb (cervix). ? The part of your body that drains urine from your bladder (urethra). ? Your eye. ? The upper part of your throat behind the nose (nasopharynx). ? The rectum. How do I prepare for this test?  Do not urinate for an hour before the test.  Try to arrive with a full bladder.  Do not use vaginal creams or douches before the test. Tell a health care provider about:  All medicines you are taking, including vitamins, herbs, eye drops, creams, and over-the-counter medicines.  Any surgeries you have had.  Any medical conditions you have.  Whether you are pregnant or may be pregnant. How are the results reported? Your test results will be reported as either positive or negative for chlamydia. What do the results mean? A positive result means that you have a chlamydia infection. A negative result is normal and means that you do not have a chlamydia infection. Talk with your health care provider about what your results  mean. Questions to ask your health care provider Ask your health care provider, or the department that is doing the test:  When will my results be ready?  How will I get my results?  What are my treatment options?  What other tests do I need?  What are my next steps? Summary  This test may be done to see if you have chlamydia, which is a common STI (sexually transmitted infection).  Depending on your symptoms, your health care provider may collect samples of urine or fluid. The fluid sample is collected by swabbing your vagina, cervix, urethra, eye, nasopharynx, or rectum.  Your test results will be reported as either positive or negative for chlamydia. This information is not intended to replace advice given to you by your health care provider. Make sure you discuss any questions you have with your health care provider. Document Released: 12/30/2004 Document Revised: 03/29/2019 Document Reviewed: 07/07/2017 Elsevier Patient Education  [employer].

## 2019-09-18 NOTE — MAU Provider Note (Signed)
Subjective:  Beth Gibson is a 33 y.o. [id number] at [redacted]w[redacted]d who presents today for FU BHCG. She was seen on 09/08/2019  Results from that day show no IUP on [place], and HCG of 4,029. She denies vaginal bleeding. She denies abdominal or pelvic pain.  Objective:  Physical Exam  Nursing note and vitals reviewed. Constitutional: She is oriented to person, place, and time. She appears well-developed and well-nourished. No distress.  HENT:  Head: Normocephalic.  Cardiovascular: Normal rate.  Respiratory: Effort normal.  GI: Soft. There is no tenderness.  Neurological: She is alert and oriented to person, place, and time. Skin: Skin is warm and dry.  Psychiatric: She has a normal mood and affect.   Results for orders placed or performed during the hospital encounter of 09/18/19 (from the past 24 hour(s))  hCG, quantitative, pregnancy     Status: Abnormal   Collection Time: 09/18/19 10:12 AM  Result Value Ref Range   hCG, Beta Chain, Quant, S 30,792 (H) <5 mIU/mL   US Ob Transvaginal  Result Date: 09/18/2019 CLINICAL DATA:  Check dates and viability EXAM: TRANSVAGINAL OB ULTRASOUND TECHNIQUE: Transvaginal ultrasound was performed for complete evaluation of the gestation as well as the maternal uterus, adnexal regions, and pelvic cul-de-sac. COMPARISON:  09/08/2019 FINDINGS: Intrauterine gestational sac: Present Yolk sac:  Present Embryo:  Absent MSD: 15.3 mm   6 w   2 d Subchorionic hemorrhage:  None visualized. Maternal uterus/adnexae: Ovaries are within normal limits. Mild free fluid is noted within the pelvis. IMPRESSION: Gestational sac with yolk sac identified although an embryo is not well seen. Probable early intrauterine gestational sac, but no fetal pole, or cardiac activity yet visualized. Recommend follow-up quantitative B-HCG levels and follow-up US in 14 days to assess viability. This recommendation follows SRU consensus guidelines: Diagnostic Criteria for Nonviable Pregnancy Early in the First  Trimester. Alta Corning Med [id number]. Electronically Signed   By: Inez Catalina M.D.   On: 09/18/2019 13:22   Assessment/Plan: + Gonorrhea/ Chlamydia: treatment given in MAU [place] now shows YS however no embryo.  HCG did  rise appropriately; however it has been 10 days since the patient was last seen. Quant went from 4,000>30,000 FU in 1 weeks for: [place] for viability Return to MAU if symptoms worsen   , Artist Pais, NP 09/18/2019 1:51 PM

## 2019-09-18 NOTE — MAU Note (Signed)
Waiting on Lidocaine from pharmacy, explained to pt.

## 2019-09-18 NOTE — MAU Note (Signed)
Pt asking if she can be called with results.  Confirmed with provider, pt needs to stay.  Reports threw up about 58min ago, will notify NP.  (had taken meds at 1025)

## 2019-09-18 NOTE — MAU Note (Signed)
Was supposed to have come in last Sun for blood work.  Was working and couldn't get off.  Didn't know we were open 24/7. Also had received call that her Gonorrhea test was positive, told she had to come in for treatment. Still having some cramping in lower abd, been spotting here and there.

## 2019-09-18 NOTE — MAU Note (Signed)
Retained meds.  To lobby waiting on result.

## 2019-09-20 ENCOUNTER — Other Ambulatory Visit: Payer: Self-pay

## 2019-09-20 ENCOUNTER — Inpatient Hospital Stay (HOSPITAL_COMMUNITY)
Admission: AD | Admit: 2019-09-20 | Discharge: 2019-09-20 | Disposition: A | Discharge status: Home / Self Care | Payer: Medicaid Other | Attending: Obstetrics & Gynecology | Admitting: Obstetrics & Gynecology

## 2019-09-20 ENCOUNTER — Encounter (HOSPITAL_COMMUNITY): Payer: Self-pay

## 2019-09-20 DIAGNOSIS — J45909 Unspecified asthma, uncomplicated: Secondary | ICD-10-CM | POA: Insufficient documentation

## 2019-09-20 DIAGNOSIS — Z3A01 Less than 8 weeks gestation of pregnancy: Secondary | ICD-10-CM | POA: Diagnosis not present

## 2019-09-20 DIAGNOSIS — O26891 Other specified pregnancy related conditions, first trimester: Secondary | ICD-10-CM | POA: Diagnosis not present

## 2019-09-20 DIAGNOSIS — O039 Complete or unspecified spontaneous abortion without complication: Secondary | ICD-10-CM | POA: Insufficient documentation

## 2019-09-20 DIAGNOSIS — F17210 Nicotine dependence, cigarettes, uncomplicated: Secondary | ICD-10-CM | POA: Diagnosis not present

## 2019-09-20 DIAGNOSIS — R102 Pelvic and perineal pain: Secondary | ICD-10-CM | POA: Insufficient documentation

## 2019-09-20 DIAGNOSIS — O99331 Smoking (tobacco) complicating pregnancy, first trimester: Secondary | ICD-10-CM | POA: Diagnosis not present

## 2019-09-20 DIAGNOSIS — O209 Hemorrhage in early pregnancy, unspecified: Secondary | ICD-10-CM | POA: Diagnosis not present

## 2019-09-20 DIAGNOSIS — O99511 Diseases of the respiratory system complicating pregnancy, first trimester: Secondary | ICD-10-CM | POA: Diagnosis not present

## 2019-09-20 DIAGNOSIS — Z332 Encounter for elective termination of pregnancy: Secondary | ICD-10-CM

## 2019-09-20 MED ORDER — OXYCODONE-ACETAMINOPHEN 5-325 MG PO TABS: 1.0000 | ORAL_TABLET | Freq: Four times a day (QID) | ORAL | 0 refills | Status: DC | PRN

## 2019-09-20 MED ORDER — HYDROMORPHONE HCL 1 MG/ML IJ SOLN
2.0000 mg | Freq: Once | INTRAMUSCULAR | Status: AC
  Administered 2019-09-20: 16:00:00 2 mg via INTRAMUSCULAR
  Filled 2019-09-20: qty 2

## 2019-09-20 MED ORDER — HYDROCODONE-ACETAMINOPHEN 5-325 MG PO TABS: 1.0000 | ORAL_TABLET | Freq: Four times a day (QID) | ORAL | 0 refills | Status: DC | PRN

## 2019-09-20 NOTE — Discharge Instructions (Signed)
°  General instructions  Write down how many pads you use each day and how soaked they are.  Watch the amount of tissue or clumps of blood (blood clots) that you pass from your vagina. Save any large amounts of tissue for your doctor.  Do not use tampons, douche, or have sex until your doctor approves.  Keep all follow-up visits as told by your doctor. This is important. Contact a doctor if:  You have a fever or chills.  You have vaginal discharge that smells bad.  You have more bleeding. Get help right away if:   You soak more than 1 regular pad in an hour for 3 or more hours   You get light-headed or weak.  You faint (pass out).  You have feelings of sadness that do not go away, or you have thoughts of hurting yourself.  This information is not intended to replace advice given to you by your health care provider. Make sure you discuss any questions you have with your health care provider. Document Released: 02/29/2012 Document Revised: 03/31/2019 Document Reviewed: 01/12/2017 Elsevier Patient Education  2020 [employer].

## 2019-09-20 NOTE — MAU Note (Signed)
.   Beth Gibson is a 33 y.o. at [redacted]w[redacted]d here in MAU reporting: that she is having severe abdominal pain. Went to the abortion clinic yesterday and was given pills to take yesterday and today and after taking the pills today she is having heavy bleeding with pain.  LMP: 08/03/19 Onset of complaint: today Pain score: 10 Vitals:   09/20/19 1433  BP: (!) 137/113  Pulse: 79  Resp: 20  Temp: 98 F (36.7 C)  SpO2: 100%     FHT: Lab orders placed from triage:

## 2019-09-20 NOTE — ED Triage Notes (Signed)
Took pills from [facility] on [address] -- for "abortion" now having severe cramping and bleeding--   MAU called --

## 2019-09-20 NOTE — MAU Provider Note (Signed)
History     CSN: [id number]  Arrival date and time: 09/20/19 1428   First Provider Initiated Contact with Patient 09/20/19 1558      Chief Complaint  Patient presents with  . Vaginal Bleeding  . miscarraige   Beth Gibson is a 33 y.o. [id number] at [redacted]w[redacted]d who presents today with cramping. She states that she took medication for a medical abortion today around 1030. She states that about 30 mins after taking the medication she started having severe cramping. She states that she was told it would be crampy, but she was not prepared for this. She states that they gave her Naproxen for it, but that has not helped.   Vaginal Bleeding The patient's primary symptoms include pelvic pain and vaginal bleeding. This is a new problem. The current episode started today. The problem occurs constantly. The problem has been gradually worsening. The problem affects both sides. She is pregnant. Pertinent negatives include no chills, dysuria, fever, frequency, nausea, urgency or vomiting. The vaginal discharge was bloody. The vaginal bleeding is heavier than menses. She has been passing clots. Passing tissue: unsure. She has tried NSAIDs for the symptoms. The treatment provided no relief.    OB History    Gravida  5   Para  2   Term  0   Preterm  2   AB  2   Living  2     SAB  1   TAB  0   Ectopic  0   Multiple  0   Live Births  2           Past Medical History:  Diagnosis Date  . Abscess of axilla, right   . Anemia   . Asthma    NO MEDS, SOB with activity  . BV (bacterial vaginosis)   . Chlamydia 2020  . Depression   . Gonorrhea 2020  . Headache(784.0)    MIGRAINES  . History of blood transfusion 2013   with child birth  . PONV (postoperative nausea and vomiting)    nausea  . Preterm labor   . Trichomonas   . UTI (lower urinary tract infection)   . Yeast vaginitis     Past Surgical History:  Procedure Laterality Date  . CESAREAN SECTION    . CESAREAN SECTION   04/18/2012   Procedure: CESAREAN SECTION;  Surgeon: Guss Bunde, MD;  Location: [facility];  Service: Gynecology;  Laterality: N/A;  repeat  . cyst removed from L buttock    . CYSTECTOMY  2009   left buttock  . DILATION AND EVACUATION N/A 02/24/2017   Procedure: DILATATION AND EVACUATION;  Surgeon: Lavonia Drafts, MD;  Location: [facility];  Service: Gynecology;  Laterality: N/A;  . HYDRADENITIS EXCISION Right 11/04/2018   Procedure: EXCISION HIDRADENITIS RIGHT  AXILLA;  Surgeon: Jovita Kussmaul, MD;  Location: [facility];  Service: General;  Laterality: Right;  . INCISION AND DRAINAGE ABSCESS Right 11/01/2014   Procedure: INCISION AND DRAINAGE RIGHT AXILLARY ABSCESS;  Surgeon: Leighton Ruff, MD;  Location: [facility];  Service: General;  Laterality: Right;  . INCISION AND DRAINAGE ABSCESS Right 06/21/2018   Procedure: INCISION AND DRAINAGE RIGHT ABSCESS AXILLA;  Surgeon: Jovita Kussmaul, MD;  Location: [facility];  Service: General;  Laterality: Right;  . IRRIGATION AND DEBRIDEMENT ABSCESS Right 08/09/2015   Procedure: IRRIGATION AND DEBRIDEMENT AXILLARY ABSCESS;  Surgeon: Alphonsa Overall, MD;  Location: [facility];  Service: General;  Laterality: Right;  . WISDOM TOOTH  EXTRACTION    . WISDOM TOOTH EXTRACTION  2009    Family History  Problem Relation Age of Onset  . Hypertension Mother   . Diabetes Mellitus I Father     Social History   Tobacco Use  . Smoking status: Current Some Day Smoker    Packs/day: 0.25    Years: 0.50    Pack years: 0.12    Types: Cigarettes  . Smokeless tobacco: Never Used  Substance Use Topics  . Alcohol use: No    Alcohol/week: 1.0 standard drinks    Types: 1 Standard drinks or equivalent per week  . Drug use: Yes    Types: Marijuana    Comment: Last use-08/2019    Allergies:  Allergies  Allergen Reactions  . Vicodin [Hydrocodone-Acetaminophen] Itching    Pt tolerates percocet    Medications Prior to Admission  Medication Sig Dispense Refill  Last Dose  . metroNIDAZOLE (FLAGYL) 500 MG tablet Take 1 tablet (500 mg total) by mouth 2 (two) times daily. 14 tablet 0     Review of Systems  Constitutional: Negative for chills and fever.  Gastrointestinal: Negative for nausea and vomiting.  Genitourinary: Positive for pelvic pain and vaginal bleeding. Negative for dysuria, frequency and urgency.   Physical Exam   Blood pressure 136/62, pulse 79, temperature 98 F (36.7 C), temperature source Oral, resp. rate 20, last menstrual period 08/03/2019, SpO2 100 %.  Physical Exam  Nursing note and vitals reviewed. Constitutional: She is oriented to person, place, and time. She appears well-developed and well-nourished. No distress.  HENT:  Head: Normocephalic.  Cardiovascular: Normal rate.  Respiratory: Effort normal.  GI: Soft. There is no abdominal tenderness. There is no rebound.  Genitourinary:    Genitourinary Comments:  External: no lesion Vagina: small amount of blood seen  Cervix: pink, smooth, tissue seen at the cervical os. Grasped with ring forceps and easily removed. Decided against sending to path as this was a planned termination.     Neurological: She is alert and oriented to person, place, and time.  Skin: Skin is warm and dry.  Psychiatric: She has a normal mood and affect.     MAU Course  Procedures  MDM Patient has had 2mg  dilaudid IM. She reports that her pain has improved.   Assessment and Plan   1. Medical abortion   2. Pelvic pain    DC home Comfort measures reviewed  Bleeding precautions RX: vicodin PRN #10  Return to MAU as needed FU with OB as planned  [place], [place] Follow up.   Specialty: Home Health Services Contact information: [place] [address] [phone]            DNP, CNM  09/20/19  5:14 PM

## 2019-09-28 ENCOUNTER — Encounter (HOSPITAL_COMMUNITY): Payer: Self-pay | Admitting: *Deleted

## 2019-09-28 ENCOUNTER — Inpatient Hospital Stay (HOSPITAL_COMMUNITY): Payer: Medicaid Other

## 2019-09-28 ENCOUNTER — Inpatient Hospital Stay (HOSPITAL_COMMUNITY)
Admission: AD | Admit: 2019-09-28 | Discharge: 2019-09-28 | Disposition: A | Discharge status: Home / Self Care | Payer: Medicaid Other | Attending: Family Medicine | Admitting: Family Medicine

## 2019-09-28 ENCOUNTER — Other Ambulatory Visit: Payer: Self-pay

## 2019-09-28 DIAGNOSIS — F17210 Nicotine dependence, cigarettes, uncomplicated: Secondary | ICD-10-CM | POA: Diagnosis not present

## 2019-09-28 DIAGNOSIS — Z79899 Other long term (current) drug therapy: Secondary | ICD-10-CM | POA: Insufficient documentation

## 2019-09-28 DIAGNOSIS — R109 Unspecified abdominal pain: Secondary | ICD-10-CM | POA: Diagnosis not present

## 2019-09-28 DIAGNOSIS — O26891 Other specified pregnancy related conditions, first trimester: Secondary | ICD-10-CM | POA: Insufficient documentation

## 2019-09-28 DIAGNOSIS — D649 Anemia, unspecified: Secondary | ICD-10-CM | POA: Insufficient documentation

## 2019-09-28 DIAGNOSIS — O99331 Smoking (tobacco) complicating pregnancy, first trimester: Secondary | ICD-10-CM | POA: Diagnosis not present

## 2019-09-28 DIAGNOSIS — Z3A08 8 weeks gestation of pregnancy: Secondary | ICD-10-CM | POA: Diagnosis not present

## 2019-09-28 DIAGNOSIS — O99011 Anemia complicating pregnancy, first trimester: Secondary | ICD-10-CM | POA: Diagnosis not present

## 2019-09-28 DIAGNOSIS — J45909 Unspecified asthma, uncomplicated: Secondary | ICD-10-CM | POA: Insufficient documentation

## 2019-09-28 DIAGNOSIS — Z9889 Other specified postprocedural states: Secondary | ICD-10-CM

## 2019-09-28 DIAGNOSIS — O99511 Diseases of the respiratory system complicating pregnancy, first trimester: Secondary | ICD-10-CM | POA: Diagnosis not present

## 2019-09-28 LAB — URINALYSIS, ROUTINE W REFLEX MICROSCOPIC
Bacteria, UA: NONE SEEN
Bilirubin Urine: NEGATIVE
Glucose, UA: NEGATIVE mg/dL
Ketones, ur: NEGATIVE mg/dL
Nitrite: NEGATIVE
Protein, ur: 100 mg/dL — AB
Specific Gravity, Urine: 1.031 — ABNORMAL HIGH (ref 1.005–1.030)
pH: 5 (ref 5.0–8.0)

## 2019-09-28 LAB — CBC
HCT: 25.6 % — ABNORMAL LOW (ref 36.0–46.0)
Hemoglobin: 8.1 g/dL — ABNORMAL LOW (ref 12.0–15.0)
MCH: 20.3 pg — ABNORMAL LOW (ref 26.0–34.0)
MCHC: 31.6 g/dL (ref 30.0–36.0)
MCV: 64 fL — ABNORMAL LOW (ref 80.0–100.0)
Platelets: 167 10*3/uL (ref 150–400)
RBC: 4 MIL/uL (ref 3.87–5.11)
RDW: 21 % — ABNORMAL HIGH (ref 11.5–15.5)
WBC: 5.7 10*3/uL (ref 4.0–10.5)
nRBC: 0 % (ref 0.0–0.2)

## 2019-09-28 MED ORDER — OXYCODONE-ACETAMINOPHEN 5-325 MG PO TABS
2.0000 | ORAL_TABLET | Freq: Once | ORAL | Status: AC
  Administered 2019-09-28: 2 via ORAL
  Filled 2019-09-28: qty 2

## 2019-09-28 MED ORDER — OXYCODONE-ACETAMINOPHEN 5-325 MG PO TABS: 1.0000 | ORAL_TABLET | Freq: Four times a day (QID) | ORAL | 0 refills | Status: DC | PRN

## 2019-09-28 NOTE — Discharge Instructions (Signed)
Prostaglandin-Induced Abortion, Care After This sheet gives you information about how to care for yourself after your procedure. Your health care provider may also give you more specific instructions. If you have problems or questions, contact your health care provider. What can I expect after the procedure? After the procedure, it is common to have:  Bleeding that lasts for a few hours or a few days. It may feel like you are having a heavy menstrual period.  A headache.  Diarrhea.  Nausea and vomiting.  Chills.  Dizziness. Your next period will most likely start 4-6 weeks after the procedure, unless you start taking birth control pills. Follow these instructions at home: Medicines   Take over-the-counter and prescription medicines only as told by your health care provider.  Only take the medicines your health care provider recommends. Do not take aspirin. It can cause bleeding. Activity  Do not have sex for 2-3 weeks or until your health care provider approves.  Rest and avoid activity that requires a lot of energy for 2-3 weeks.  Do not drive or use heavy machinery while taking prescription pain medicine. General instructions  There will be bleeding after the procedure. It is recommended that you: ? Write down how many menstrual pads you use each day and how soaked they are. This could be useful information for your health care provider. ? Check for any large blood clots or tissue when you change your menstrual pad. If you pass tissue, save the tissue to show to your health care provider.  Do not douche or use tampons until your health care provider approves.  Ask your health care provider when you can start using hormonal birth control (contraception).  Keep all follow-up visits as told by your health care provider. This is important. Contact a health care provider if:  You have chills or a fever.  You have pain that is not relieved by prescription pain  medicine.  You have a bad-smelling vaginal discharge.  You have pain or bleeding that gets worse instead of better.  You have any of the following for more than 24 hours: ? Nausea. ? Vomiting. ? Diarrhea. Get help right away if:  You have severe cramps in your stomach, back, or abdomen.  You pass large blood clots or tissue out of your vagina. Save any tissue for your health care provider to inspect.  You need to change your pad more than once in an hour.  You become light-headed, weak, or faint. Summary  After the procedure it is common to have bleeding, headache, diarrhea, nausea and vomiting, chills, and dizziness.  Take over-the-counter and prescription medicines only as told by your health care provider.  Do not have sex for 2-3 weeks or until your health care provider approves.  When you are bleeding after the procedure, write down how many menstrual pads you use each day and how soaked they are.  Keep all follow-up visits as told by your health care provider. This is important. This information is not intended to replace advice given to you by your health care provider. Make sure you discuss any questions you have with your health care provider. Document Released: 12/12/2013 Document Revised: 11/19/2017 Document Reviewed: 02/24/2017 Elsevier Patient Education  2020 [employer].

## 2019-09-28 NOTE — MAU Provider Note (Addendum)
Chief Complaint: Abdominal Pain    SUBJECTIVE HPI: Beth Gibson is a 33 y.o. [id number] at [redacted]w[redacted]d who presents to [facility] reporting 9-10/10 abdominal pain, thigh pain and pressure, and vaginal bleeding after taking medication for a medical abortion on 9/30. She was previously seen on 9/30 in the MAU after initially taking the medication due to severe abdominal pain. At that time she was provided dilaudid and percocet, which she stated helped. She took her last percocet pill 09/25/19 evening. She has continued to have abdominal pain since that time. She has tried naproxen, ibuprofen, heat packs, soaking in the bath, and resting and she reports none of these have helped. She states that the pain is so severe she cannot stand for more than 10-15 minutes. She reports that her bleeding is about the amount of a typical period, using multiple pads a day and ~1 pad every 3 hours. She denies any chest pain, SOB, nausea, vomiting, rectal pain, diarrhea, constipation, dysuria, hematuria, or joint pain.     Past Medical History:  Diagnosis Date  . Abscess of axilla, right   . Anemia   . Asthma    NO MEDS, SOB with activity  . BV (bacterial vaginosis)   . Chlamydia 2020  . Depression   . Gonorrhea 2020  . Headache(784.0)    MIGRAINES  . History of blood transfusion 2013   with child birth  . PONV (postoperative nausea and vomiting)    nausea  . Preterm labor   . Trichomonas   . UTI (lower urinary tract infection)   . Yeast vaginitis    OB History  Gravida Para Term Preterm AB Living  5 2 0 2 2 2   SAB TAB Ectopic Multiple Live Births  1 0 0 0 2    # Outcome Date GA Lbr Len/2nd Weight Sex Delivery Anes PTL Lv  5 Current           4 Preterm 04/18/12 [redacted]w[redacted]d  2455 g F CS-LTranv Spinal  LIV  3 Preterm 11/30/10 [redacted]w[redacted]d  1389 g M CS-Classical EPI  LIV  2 AB           1 SAB            Past Surgical History:  Procedure Laterality Date  . CESAREAN SECTION    . CESAREAN SECTION  04/18/2012    Procedure: CESAREAN SECTION;  Surgeon: Guss Bunde, MD;  Location: [facility];  Service: Gynecology;  Laterality: N/A;  repeat  . cyst removed from L buttock    . CYSTECTOMY  2009   left buttock  . DILATION AND EVACUATION N/A 02/24/2017   Procedure: DILATATION AND EVACUATION;  Surgeon: Lavonia Drafts, MD;  Location: [facility];  Service: Gynecology;  Laterality: N/A;  . HYDRADENITIS EXCISION Right 11/04/2018   Procedure: EXCISION HIDRADENITIS RIGHT  AXILLA;  Surgeon: Jovita Kussmaul, MD;  Location: [facility];  Service: General;  Laterality: Right;  . INCISION AND DRAINAGE ABSCESS Right 11/01/2014   Procedure: INCISION AND DRAINAGE RIGHT AXILLARY ABSCESS;  Surgeon: Leighton Ruff, MD;  Location: [facility];  Service: General;  Laterality: Right;  . INCISION AND DRAINAGE ABSCESS Right 06/21/2018   Procedure: INCISION AND DRAINAGE RIGHT ABSCESS AXILLA;  Surgeon: Jovita Kussmaul, MD;  Location: [facility];  Service: General;  Laterality: Right;  . IRRIGATION AND DEBRIDEMENT ABSCESS Right 08/09/2015   Procedure: IRRIGATION AND DEBRIDEMENT AXILLARY ABSCESS;  Surgeon: Alphonsa Overall, MD;  Location: [facility];  Service: General;  Laterality:  Right;  Marland Kitchen WISDOM TOOTH EXTRACTION    . WISDOM TOOTH EXTRACTION  2009   Social History   Socioeconomic History  . Marital status: Single    Spouse name: Not on file  . Number of children: Not on file  . Years of education: Not on file  . Highest education level: Not on file  Occupational History  . Not on file  Social Needs  . Financial resource strain: Not on file  . Food insecurity    Worry: Not on file    Inability: Not on file  . Transportation needs    Medical: Not on file    Non-medical: Not on file  Tobacco Use  . Smoking status: Current Some Day Smoker    Packs/day: 0.25    Years: 0.50    Pack years: 0.12    Types: Cigarettes  . Smokeless tobacco: Never Used  Substance and Sexual Activity  . Alcohol use: No    Alcohol/week: 1.0  standard drinks    Types: 1 Standard drinks or equivalent per week  . Drug use: Yes    Types: Marijuana    Comment: Last use-08/2019  . Sexual activity: Yes    Birth control/protection: None  Lifestyle  . Physical activity    Days per week: Not on file    Minutes per session: Not on file  . Stress: Not on file  Relationships  . Social Herbalist on phone: Not on file    Gets together: Not on file    Attends religious service: Not on file    Active member of club or organization: Not on file    Attends meetings of clubs or organizations: Not on file    Relationship status: Not on file  . Intimate partner violence    Fear of current or ex partner: Not on file    Emotionally abused: Not on file    Physically abused: Not on file    Forced sexual activity: Not on file  Other Topics Concern  . Not on file  Social History Narrative  . Not on file   Family History  Problem Relation Age of Onset  . Hypertension Mother   . Diabetes Mellitus I Father    No current facility-administered medications on file prior to encounter.    Current Outpatient Medications on File Prior to Encounter  Medication Sig Dispense Refill  . acetaminophen (TYLENOL) 500 MG tablet Take 1,000 mg by mouth every 6 (six) hours as needed.    Marland Kitchen ibuprofen (ADVIL) 200 MG tablet Take 200 mg by mouth every 6 (six) hours as needed.    . metroNIDAZOLE (FLAGYL) 500 MG tablet Take 1 tablet (500 mg total) by mouth 2 (two) times daily. 14 tablet 0  . oxyCODONE-acetaminophen (PERCOCET/ROXICET) 5-325 MG tablet Take 1-2 tablets by mouth every 6 (six) hours as needed for severe pain. 10 tablet 0   Allergies  Allergen Reactions  . Vicodin [Hydrocodone-Acetaminophen] Itching    Pt tolerates percocet    I have reviewed patient's Past Medical Hx, Surgical Hx, Family Hx, Social Hx, medications and allergies.   Review of Systems  Constitutional: Negative for chills and fever.  HENT: Positive for congestion.    Respiratory: Negative for cough and shortness of breath.   Cardiovascular: Negative for chest pain and palpitations.  Gastrointestinal: Positive for abdominal pain. Negative for abdominal distention, anal bleeding, blood in stool, constipation, diarrhea, nausea, rectal pain and vomiting.  Endocrine: Negative for polyuria.  Genitourinary:  Positive for pelvic pain and vaginal bleeding. Negative for dysuria, hematuria, urgency, vaginal discharge and vaginal pain.    OBJECTIVE Patient Vitals for the past 24 hrs:  BP Temp Pulse Resp Height Weight  09/28/19 1824 (!) 135/94 98.1 F (36.7 C) 87 18 5\' 6"  (1.676 m) 100.7 kg   Constitutional: Well-developed, well-nourished female in no acute distress.  Cardiovascular: normal rate & rhythm, no murmur Respiratory: normal rate and effort. Lung sounds clear throughout GI: diffusely tender, no peritoneal signs MS: Extremities nontender, no edema, normal ROM Neurologic: Alert and oriented x 4.  GU:    not done   LAB RESULTS No results found for this or any previous visit (from the past 24 hour(s)).  IMAGING No results found.  MAU COURSE Orders Placed This Encounter  Procedures  . US OB Transvaginal  . Urinalysis, Routine w reflex microscopic   Meds ordered this encounter  Medications  . oxyCODONE-acetaminophen (PERCOCET/ROXICET) 5-325 MG per tablet 2 tablet   DDx includes retained products of conception vs normal medical abortion. Ectopic highly unlikely given u/s on 9/28 confirming intrauterine yolk sac. Workup will include transvaginal u/s and CBC.   U/s and labs pending.   Plan for pain management: 2 doses of percocet.      Kathryne Eriksson, Medical Student 09/28/2019  7:58 PM  I confirm that I have verified the information documented in the medical student's note and that I have also personally performed the history, physical exam and all medical decision making activities of this service and have verified that all service and  findings are accurately documented in this student's note.   Report to Hansel Feinstein, CNM, with [place] and labs pending.   Elvera Maria, CNM 09/28/2019 8:06 PM   Report taken from Hansel Feinstein (never saw pt).  [place] negative, pt's pain better. DC home w/precautions (bleeding/pain). Has rx for COCs and f/u already scheduled in 2 weeks.

## 2019-09-28 NOTE — MAU Note (Signed)
Pt reports abd pain in her abd ,back and runs down her to her thighs/legs that has been since her abortion last week. (took the pills). Reports she is still having vaginal bleeding but is not as heavy

## 2019-09-28 NOTE — Progress Notes (Signed)
Written and verbal d/c instructions given by Alycia Rossetti RN and pt voiced understanding

## 2019-12-25 ENCOUNTER — Ambulatory Visit (HOSPITAL_COMMUNITY)
Admission: EM | Admit: 2019-12-25 | Discharge: 2019-12-25 | Disposition: A | Discharge status: Home / Self Care | Payer: Medicaid Other | Attending: Family Medicine | Admitting: Family Medicine

## 2019-12-25 ENCOUNTER — Other Ambulatory Visit: Payer: Self-pay

## 2019-12-25 ENCOUNTER — Inpatient Hospital Stay (HOSPITAL_COMMUNITY)
Admission: AD | Admit: 2019-12-25 | Discharge: 2019-12-25 | Disposition: A | Discharge status: Home / Self Care | Payer: Medicaid Other | Attending: Family Medicine | Admitting: Family Medicine

## 2019-12-25 ENCOUNTER — Telehealth (HOSPITAL_COMMUNITY): Payer: Self-pay | Admitting: Family Medicine

## 2019-12-25 ENCOUNTER — Encounter (HOSPITAL_COMMUNITY): Payer: Self-pay

## 2019-12-25 DIAGNOSIS — R109 Unspecified abdominal pain: Secondary | ICD-10-CM | POA: Diagnosis present

## 2019-12-25 DIAGNOSIS — N939 Abnormal uterine and vaginal bleeding, unspecified: Secondary | ICD-10-CM | POA: Insufficient documentation

## 2019-12-25 DIAGNOSIS — R030 Elevated blood-pressure reading, without diagnosis of hypertension: Secondary | ICD-10-CM | POA: Diagnosis not present

## 2019-12-25 DIAGNOSIS — Z3202 Encounter for pregnancy test, result negative: Secondary | ICD-10-CM | POA: Diagnosis not present

## 2019-12-25 DIAGNOSIS — Z3201 Encounter for pregnancy test, result positive: Secondary | ICD-10-CM

## 2019-12-25 DIAGNOSIS — O200 Threatened abortion: Secondary | ICD-10-CM

## 2019-12-25 DIAGNOSIS — R1030 Lower abdominal pain, unspecified: Secondary | ICD-10-CM

## 2019-12-25 LAB — POCT PREGNANCY, URINE: Preg Test, Ur: NEGATIVE

## 2019-12-25 LAB — HCG, QUANTITATIVE, PREGNANCY: hCG, Beta Chain, Quant, S: <1 m[IU]/mL (ref <5)

## 2019-12-25 MED ORDER — OXYCODONE-ACETAMINOPHEN 5-325 MG PO TABS: 1.0000 | ORAL_TABLET | Freq: Four times a day (QID) | ORAL | 0 refills | Status: DC | PRN

## 2019-12-25 MED ORDER — MEGESTROL ACETATE 40 MG PO TABS: 40.0000 mg | ORAL_TABLET | Freq: Every day | ORAL | 0 refills | Status: AC

## 2019-12-25 NOTE — MAU Note (Signed)
.   Beth Gibson is a 34 y.o. at [redacted]w[redacted]d here in MAU reporting: she went to urgent care today for heavy vaginal bleeding and she was told she was pregnant. PT states she had a termination in September and had a regular cycle the end of the December.  LMP: 12/17/19 Onset of complaint: ongoing vaginal bleeding Pain score: 10 Vitals:   12/25/19 1129 12/25/19 1130  BP: (!) 152/74   Pulse: 89   Resp: 18   Temp: 98.6 F (37 C)   SpO2:  100%     FHT: Lab orders placed from triage: UPT

## 2019-12-25 NOTE — MAU Provider Note (Signed)
First Provider Initiated Contact with Patient 12/25/19 1245     S Ms. Beth Gibson is a 34 y.o. [id number] non-pregnant female who presents to MAU today with complaint of abdominal pain and vaginal bleeding x9days. Pt reports she was sent over from Urgent Care today for evaluation because they ran a pregnancy test in the office which was positive.  O BP (!) 152/74   Pulse 89   Temp 98.6 F (37 C)   Resp 18   Ht 5\' 8"  (1.727 m)   Wt 93 kg   LMP 12/17/2019   SpO2 100%   BMI 31.17 kg/m  Physical Exam  Constitutional: She is oriented to person, place, and time. She appears well-developed and well-nourished. No distress.  HENT:  Head: Normocephalic and atraumatic.  Respiratory: Effort normal.  Neurological: She is alert and oriented to person, place, and time.  Skin: She is not diaphoretic.  Psychiatric: She has a normal mood and affect. Her behavior is normal. Judgment and thought content normal.   A Non pregnant female Medical screening exam complete  P Discharge from MAU in stable condition Pt upset that she was sent over from Urgent Care with incorrect test results and hastily left MAU requesting that we call urgent care to let them know that she was coming back to get a prescription for pain medication Patient given the option of transfer to [facility] for further evaluation or receive a list of OB/GYN providers in the area, but patient declines stating she will just go back to Urgent Care. Unable to discuss patient's blood pressure prior to her departure from MAU. Attempted to call a provider at Urgent Care, but none available. Left message with front desk regarding negative urine and hCG tests for pregnancy and that patient would be returning to Urgent Care for further evaluation. Also left message regarding elevated blood pressure that I was unable to discuss with patient prior to her leaving MAU. Message left for Dr. Mannie Stabile who saw patient earlier this morning. Warning signs for  worsening condition that would warrant emergency follow-up discussed Patient may return to MAU as needed for pregnancy related complaints  [name], Gerrie Nordmann, NP 12/25/2019 1:00 PM

## 2019-12-25 NOTE — ED Triage Notes (Signed)
Pt presents with abnormal vaginal bleeding and abnormal period pain X 9 days.

## 2019-12-25 NOTE — ED Provider Notes (Signed)
[facility]   [id number] 12/25/19 Arrival Time: [id number]  ASSESSMENT & PLAN:  1. Abnormal vaginal bleeding   2. Positive pregnancy test   3. Threatened miscarriage   4. Lower abdominal pain     UPT: Positive. Vitals stable.  I had already sent in Rx for Megace before UPT. She will not fill.  To proceed to MAU for evaluation now. I cannot r/o ectopic pregnancy.  Follow-up Information    Go to  [facility] 1S Maternity Assessment Unit.   Specialty: Obstetrics and Gynecology Contact information: [address] [id number] [place] [phone]          Reviewed expectations re: course of current medical issues. Questions answered. Outlined signs and symptoms indicating need for more acute intervention. Patient verbalized understanding. After Visit Summary given.   SUBJECTIVE: History from: patient. Beth Gibson is a 34 y.o. female who presents with complaint of lower abdominal pain and very heavy vaginal bleeding for the past week; maybe longer. Reports soaking several pads each day and now passing large clots of blood. Describes abdominal pain as dull and aching; occasional sharp pains recently. No radiation. No back pain reported. Pain does not wake her at night. Reports normal flatus. Symptoms are gradually worsening since beginning. Fever: absent. Aggravating factors: have not been identified. Alleviating factors: have not been identified. Associated symptoms: mild nausea without emesis. She denies constipation, diarrhea and headache. Appetite: normal. PO intake: normal. Ambulatory without assistance "but hurts to walk". Urinary symptoms: none. Bowel movements: have not significantly changed. Reports having an abortion approx two months ago. Negative pregnancy test since. Is sexually active.  OTC treatment: OTC analgesics without much relief..  Patient's last menstrual period was 12/17/2019.   Past Surgical History:  Procedure  Laterality Date  . CESAREAN SECTION    . CESAREAN SECTION  04/18/2012   Procedure: CESAREAN SECTION;  Surgeon: Guss Bunde, MD;  Location: [facility];  Service: Gynecology;  Laterality: N/A;  repeat  . cyst removed from L buttock    . CYSTECTOMY  2009   left buttock  . DILATION AND EVACUATION N/A 02/24/2017   Procedure: DILATATION AND EVACUATION;  Surgeon: Lavonia Drafts, MD;  Location: [facility];  Service: Gynecology;  Laterality: N/A;  . HYDRADENITIS EXCISION Right 11/04/2018   Procedure: EXCISION HIDRADENITIS RIGHT  AXILLA;  Surgeon: Jovita Kussmaul, MD;  Location: [facility];  Service: General;  Laterality: Right;  . INCISION AND DRAINAGE ABSCESS Right 11/01/2014   Procedure: INCISION AND DRAINAGE RIGHT AXILLARY ABSCESS;  Surgeon: Leighton Ruff, MD;  Location: [facility];  Service: General;  Laterality: Right;  . INCISION AND DRAINAGE ABSCESS Right 06/21/2018   Procedure: INCISION AND DRAINAGE RIGHT ABSCESS AXILLA;  Surgeon: Jovita Kussmaul, MD;  Location: [facility];  Service: General;  Laterality: Right;  . IRRIGATION AND DEBRIDEMENT ABSCESS Right 08/09/2015   Procedure: IRRIGATION AND DEBRIDEMENT AXILLARY ABSCESS;  Surgeon: Alphonsa Overall, MD;  Location: [facility];  Service: General;  Laterality: Right;  . WISDOM TOOTH EXTRACTION    . WISDOM TOOTH EXTRACTION  2009    ROS: As per HPI. All other systems negative.  OBJECTIVE:  Vitals:   12/25/19 0935  BP: (!) 147/94  Pulse: 85  Resp: 18  Temp: 98.3 F (36.8 C)  TempSrc: Oral  SpO2: 100%    General appearance: alert, oriented, no acute distress but appears to be in pain HEENT: [place]; AT; oropharynx moist Lungs: clear to auscultation bilaterally; unlabored respirations Heart: regular  rate and rhythm Abdomen: soft; without distention; mild  and poorly localized tenderness to palpation over lower abdomen; normal bowel sounds; without masses or organomegaly; without guarding or rebound tenderness Back: without CVA tenderness; FROM  at waist Extremities: without LE edema; symmetrical; without gross deformities Skin: warm and dry Neurologic: normal gait Psychological: alert and cooperative; normal mood and affect  Labs:  Labs Reviewed  POC URINE PREG, ED  Positive.    Allergies  Allergen Reactions  . Vicodin [Hydrocodone-Acetaminophen] Itching    Pt tolerates percocet                                               Past Medical History:  Diagnosis Date  . Abscess of axilla, right   . Anemia   . Asthma    NO MEDS, SOB with activity  . BV (bacterial vaginosis)   . Chlamydia 2020  . Depression   . Gonorrhea 2020  . Headache(784.0)    MIGRAINES  . History of blood transfusion 2013   with child birth  . PONV (postoperative nausea and vomiting)    nausea  . Preterm labor   . Trichomonas   . UTI (lower urinary tract infection)   . Yeast vaginitis    Social History   Socioeconomic History  . Marital status: Single    Spouse name: Not on file  . Number of children: Not on file  . Years of education: Not on file  . Highest education level: Not on file  Occupational History  . Not on file  Tobacco Use  . Smoking status: Current Some Day Smoker    Packs/day: 0.25    Years: 0.50    Pack years: 0.12    Types: Cigarettes  . Smokeless tobacco: Never Used  Substance and Sexual Activity  . Alcohol use: No    Alcohol/week: 1.0 standard drinks    Types: 1 Standard drinks or equivalent per week  . Drug use: Yes    Types: Marijuana    Comment: Last use-08/2019  . Sexual activity: Yes    Birth control/protection: None  Other Topics Concern  . Not on file  Social History Narrative  . Not on file   Social Determinants of Health   Financial Resource Strain:   . Difficulty of Paying Living Expenses: Not on file  Food Insecurity:   . Worried About Charity fundraiser in the Last Year: Not on file  . Ran Out of Food in the Last Year: Not on file  Transportation Needs:   . Lack of Transportation  (Medical): Not on file  . Lack of Transportation (Non-Medical): Not on file  Physical Activity:   . Days of Exercise per Week: Not on file  . Minutes of Exercise per Session: Not on file  Stress:   . Feeling of Stress : Not on file  Social Connections:   . Frequency of Communication with Friends and Family: Not on file  . Frequency of Social Gatherings with Friends and Family: Not on file  . Attends Religious Services: Not on file  . Active Member of Clubs or Organizations: Not on file  . Attends Archivist Meetings: Not on file  . Marital Status: Not on file  Intimate Partner Violence:   . Fear of Current or Ex-Partner: Not on file  . Emotionally Abused: Not on file  .  Physically Abused: Not on file  . Sexually Abused: Not on file   Family History  Problem Relation Age of Onset  . Hypertension Mother   . Diabetes Mellitus I Father      Vanessa Kick, MD 12/25/19 1059

## 2019-12-28 NOTE — Telephone Encounter (Signed)
Pain medication sent. Sedation precautions.  [place] Controlled Substances Registry consulted for this patient. I feel the risk/benefit ratio today is favorable for proceeding with this prescription for a controlled substance. Medication sedation precautions given.

## 2020-02-13 ENCOUNTER — Telehealth: Payer: Self-pay | Admitting: Student

## 2020-02-13 DIAGNOSIS — N939 Abnormal uterine and vaginal bleeding, unspecified: Secondary | ICD-10-CM

## 2020-02-13 NOTE — Telephone Encounter (Signed)
The patient stated she has been bleeding for almost two months straight and is unsure what is going on. She stated the last MD she seen prescribed her some medication however she almost out. She has an appointment pending here but she is on the wait list for the next available. She stated she would like to speak with a nurse regarding the issue as she starting to feel weak.

## 2020-02-14 MED ORDER — MEGESTROL ACETATE 40 MG PO TABS: ORAL_TABLET | ORAL | 1 refills | Status: DC

## 2020-02-14 NOTE — Addendum Note (Signed)
Addended by: Langston Reusing on: 02/14/2020 02:43 PM   Modules accepted: Orders

## 2020-02-14 NOTE — Telephone Encounter (Signed)
Eleonora called back and left another message she missed our call and she is calling about her bleeding and that her appointment is not until April and she is out of medicine and weak . She states we have her permission to leave detailed information on her voice mail. Linda,RN

## 2020-02-14 NOTE — Addendum Note (Signed)
Addended by: Langston Reusing on: 02/14/2020 03:28 PM   Modules accepted: Orders

## 2020-02-14 NOTE — Telephone Encounter (Addendum)
Spoke w/pt and obtained details regarding her vaginal bleeding. She states that following her [facility] visit on 12/25/19, she took Megace as prescribed (40 mg po daily x14 days) and the bleeding slowed down and became black. Beginning 01/16/20 she started having heavy bright red bleeding again. Since that time, she is saturating one pad per hour every Shanvi Moyd and now is beginning to feel weak. I stated to pt that I will discuss with provider and call her back with recommended plan of care. Pt voiced understanding.   1420  Per consult w/Dr. Harolyn Rutherford, Rx for Megace sent to pharmacy. Pelvic US ordered and appt scheduled on 3/3 @ 1pm. She recommends pt to have CBC this week and Gyn appt in April or sooner if it becomes available. Pt was called and informed of plan of care. She voiced understanding of all instructions and information given. Pt agreed to all recommendations. Pt will have CBC tomorrow @ 1030.

## 2020-02-14 NOTE — Telephone Encounter (Signed)
Called pt and left message stating that I am calling to discuss her concerns and provide medical advice. Please return our call and leave a new message stating additional details about her situation as well as if a message can be left on her VM if she does not answer.

## 2020-02-15 ENCOUNTER — Other Ambulatory Visit: Payer: Medicaid Other

## 2020-02-15 DIAGNOSIS — N939 Abnormal uterine and vaginal bleeding, unspecified: Secondary | ICD-10-CM

## 2020-02-15 LAB — CBC
Hematocrit: 27 % — ABNORMAL LOW (ref 34.0–46.6)
Hemoglobin: 7.7 g/dL — ABNORMAL LOW (ref 11.1–15.9)
MCH: 17.3 pg — ABNORMAL LOW (ref 26.6–33.0)
MCHC: 28.5 g/dL — ABNORMAL LOW (ref 31.5–35.7)
MCV: 61 fL — ABNORMAL LOW (ref 79–97)
Platelets: 369 10*3/uL (ref 150–450)
RBC: 4.44 x10E6/uL (ref 3.77–5.28)
RDW: 21 % — ABNORMAL HIGH (ref 11.7–15.4)
WBC: 5.8 10*3/uL (ref 3.4–10.8)

## 2020-02-16 ENCOUNTER — Encounter: Payer: Self-pay | Admitting: Obstetrics & Gynecology

## 2020-02-16 ENCOUNTER — Other Ambulatory Visit: Payer: Self-pay | Admitting: Obstetrics & Gynecology

## 2020-02-16 DIAGNOSIS — D500 Iron deficiency anemia secondary to blood loss (chronic): Secondary | ICD-10-CM

## 2020-02-19 ENCOUNTER — Telehealth: Payer: Self-pay | Admitting: *Deleted

## 2020-02-19 NOTE — Telephone Encounter (Addendum)
-----   Message from Osborne Oman, MD sent at 02/16/2020  3:18 PM EST ----- Feraheme orders signed and held in case this is needed. If there is need for transfusion, call [facility] or [facility] to see if outpatient transfusions are given and I can enter the pertinent orders needed. If no outpatient options, she will need to be observed inpatient for transfusion.   Osborne Oman, MD  [facility]  If she has symptomatic anemia, she may need a transfusion. Or she can have Feraheme if not symptomatic. Please call patient and discuss options with her. Thank you!   I called Chellie and she reports she is taking her megace as ordered 2 pills is am and 2 in pm. She states her bleeding is getting a little better. States at first it felt like "she was peeing blood". She reports at first she was changing her pad about 8 times a day and they would be full and leaking with quarter size clots. States now she is changing about 5 times a day and are mostly full but not leaking and have dollar coin size clots. She reports the pills make her have an appetite and she doesn't want to gain weight.  She reports sometimes she feels dizzy or lightheaded.  I explained Dr. Harolyn Rutherford wanted [place] to follow up with her and discuss since she is anemic with hemoglobin 7.7 she can have IV fereheme or blood transfusion. She states she prefers IV fereheme. I explained I will schedule and call her back with an appointment. I also informed her I do not see that she has follow up scheduled except for [place]. She states registrar informed her we do not have any appointments until April and she is on waitlist. I informed her I will forward this message to Dr. Harolyn Rutherford and if Dr. Harolyn Rutherford feels she needs to be sooner we will have registrar schedule. She voices understanding.  Jacques Navy

## 2020-02-19 NOTE — Telephone Encounter (Signed)
I called [facility] to schedule IV fereheme and scheduled for first available appointment at [facility] for 02/26/20 at 0800 . I called Nakiah and informed her of appointment. She voices understanding.  Jayren Cease,RN

## 2020-02-21 ENCOUNTER — Ambulatory Visit (HOSPITAL_COMMUNITY)
Admission: RE | Admit: 2020-02-21 | Discharge: 2020-02-21 | Disposition: A | Discharge status: Home / Self Care | Payer: Medicaid Other | Source: Ambulatory Visit | Attending: Obstetrics & Gynecology | Admitting: Obstetrics & Gynecology

## 2020-02-21 ENCOUNTER — Other Ambulatory Visit: Payer: Self-pay

## 2020-02-21 DIAGNOSIS — N939 Abnormal uterine and vaginal bleeding, unspecified: Secondary | ICD-10-CM | POA: Diagnosis present

## 2020-02-23 ENCOUNTER — Telehealth: Payer: Self-pay | Admitting: *Deleted

## 2020-02-23 NOTE — Telephone Encounter (Signed)
Osborne Oman, MD  [facility]  Patient has possible adenomyosis, this can lead to AUB. Needs appointment with any MD (surgeon) to discuss further long term management. Please call to inform patient of results and recommendations.   I called Mac[facility] to discuss but heard a message " voicemail is full and cannot accept messages, please call back later". She does not have MyChart. We will call back later and if we do not reach her , will need to send letter. Jacques Navy

## 2020-02-26 ENCOUNTER — Encounter (HOSPITAL_COMMUNITY)
Admission: RE | Admit: 2020-02-26 | Discharge: 2020-02-26 | Disposition: A | Discharge status: Home / Self Care | Payer: Medicaid Other | Source: Ambulatory Visit | Attending: Obstetrics & Gynecology | Admitting: Obstetrics & Gynecology

## 2020-02-26 ENCOUNTER — Other Ambulatory Visit: Payer: Self-pay

## 2020-02-26 DIAGNOSIS — D500 Iron deficiency anemia secondary to blood loss (chronic): Secondary | ICD-10-CM | POA: Insufficient documentation

## 2020-02-26 MED ORDER — SODIUM CHLORIDE 0.9 % IV SOLN
510.0000 mg | INTRAVENOUS | Status: DC
  Administered 2020-02-26: 510 mg via INTRAVENOUS
  Filled 2020-02-26: qty 17

## 2020-02-26 NOTE — Discharge Instructions (Signed)

## 2020-02-26 NOTE — Progress Notes (Signed)
After infusion completed patient stated she had some nausea.  Asked her if she wanted any coke or gingerale she stated she would just eat some ice, and the nausea did subside, pt never vomitted.  Prior to DC patient stated she felt a sharpe pain in her upper mid chest and throat that comes and goes.  I asked her if it felt like heart burn and she stated she didn't think so.  VSS  And patient had another appt at the dentist to get to.  I asked her if she had any other symptoms and if she felt like there was any swelling in her mouth or throat or any difficulty breathing and she said no.  I told her everything else looked stable, but if she wanted to she could go get checked out in the ER.  She stated she would just go get an antacid before her Dentist appt.  I advised her if she felt like it was getting worse to go to the ER and she verbalized understanding.

## 2020-02-27 NOTE — Telephone Encounter (Signed)
Pt left VM on nurse line stating she has a missed call from our office with no VM. Called pt to give results and follow up recommendations; VM is full, unable to leave message. Pt does not have MyChart. Letter sent today with results.

## 2020-02-27 NOTE — Telephone Encounter (Signed)
Attempted to call patient to inform her of results and recommendations for follow up. Patient did not answer and mailbox was full and could not leave a message. Letter printed and mailed to patient.

## 2020-03-04 ENCOUNTER — Other Ambulatory Visit: Payer: Self-pay

## 2020-03-04 ENCOUNTER — Emergency Department (HOSPITAL_COMMUNITY): Payer: Medicaid Other

## 2020-03-04 ENCOUNTER — Encounter (HOSPITAL_COMMUNITY)
Admission: RE | Admit: 2020-03-04 | Discharge: 2020-03-04 | Disposition: A | Discharge status: Home / Self Care | Payer: Medicaid Other | Source: Ambulatory Visit | Attending: Obstetrics & Gynecology | Admitting: Obstetrics & Gynecology

## 2020-03-04 ENCOUNTER — Encounter (HOSPITAL_COMMUNITY): Payer: Self-pay

## 2020-03-04 ENCOUNTER — Emergency Department (HOSPITAL_COMMUNITY)
Admission: EM | Admit: 2020-03-04 | Discharge: 2020-03-04 | Disposition: A | Discharge status: Home / Self Care | Payer: Medicaid Other | Attending: Emergency Medicine | Admitting: Emergency Medicine

## 2020-03-04 DIAGNOSIS — R0989 Other specified symptoms and signs involving the circulatory and respiratory systems: Secondary | ICD-10-CM | POA: Insufficient documentation

## 2020-03-04 DIAGNOSIS — J45909 Unspecified asthma, uncomplicated: Secondary | ICD-10-CM | POA: Diagnosis not present

## 2020-03-04 DIAGNOSIS — H538 Other visual disturbances: Secondary | ICD-10-CM | POA: Insufficient documentation

## 2020-03-04 DIAGNOSIS — D500 Iron deficiency anemia secondary to blood loss (chronic): Secondary | ICD-10-CM | POA: Diagnosis not present

## 2020-03-04 DIAGNOSIS — R0789 Other chest pain: Secondary | ICD-10-CM | POA: Insufficient documentation

## 2020-03-04 DIAGNOSIS — R0602 Shortness of breath: Secondary | ICD-10-CM | POA: Diagnosis not present

## 2020-03-04 DIAGNOSIS — F17210 Nicotine dependence, cigarettes, uncomplicated: Secondary | ICD-10-CM | POA: Insufficient documentation

## 2020-03-04 LAB — CBC
HCT: 32.9 % — ABNORMAL LOW (ref 36.0–46.0)
Hemoglobin: 9.2 g/dL — ABNORMAL LOW (ref 12.0–15.0)
MCH: 17.9 pg — ABNORMAL LOW (ref 26.0–34.0)
MCHC: 28 g/dL — ABNORMAL LOW (ref 30.0–36.0)
MCV: 63.9 fL — ABNORMAL LOW (ref 80.0–100.0)
Platelets: 156 10*3/uL (ref 150–400)
RBC: 5.15 MIL/uL — ABNORMAL HIGH (ref 3.87–5.11)
RDW: 27.1 % — ABNORMAL HIGH (ref 11.5–15.5)
WBC: 5.7 10*3/uL (ref 4.0–10.5)
nRBC: 0 % (ref 0.0–0.2)

## 2020-03-04 LAB — TROPONIN I (HIGH SENSITIVITY): Troponin I (High Sensitivity): <2 ng/L (ref <18)

## 2020-03-04 LAB — ABO/RH: ABO/RH(D): B POS

## 2020-03-04 LAB — TYPE AND SCREEN
ABO/RH(D): B POS
Antibody Screen: NEGATIVE

## 2020-03-04 LAB — BASIC METABOLIC PANEL
Anion gap: 10 (ref 5–15)
BUN: 9 mg/dL (ref 6–20)
CO2: 20 mmol/L — ABNORMAL LOW (ref 22–32)
Calcium: 9.8 mg/dL (ref 8.9–10.3)
Chloride: 107 mmol/L (ref 98–111)
Creatinine, Ser: 0.85 mg/dL (ref 0.44–1.00)
GFR calc Af Amer: >60 mL/min (ref >60)
GFR calc non Af Amer: >60 mL/min (ref >60)
Glucose, Bld: 90 mg/dL (ref 70–99)
Potassium: 4.3 mmol/L (ref 3.5–5.1)
Sodium: 137 mmol/L (ref 135–145)

## 2020-03-04 LAB — I-STAT BETA HCG BLOOD, ED (MC, WL, AP ONLY): I-stat hCG, quantitative: <5 m[IU]/mL (ref <5)

## 2020-03-04 MED ORDER — ALUM & MAG HYDROXIDE-SIMETH 200-200-20 MG/5ML PO SUSP
30.0000 mL | Freq: Once | ORAL | Status: AC
  Administered 2020-03-04: 30 mL via ORAL
  Filled 2020-03-04: qty 30

## 2020-03-04 MED ORDER — LIDOCAINE VISCOUS HCL 2 % MT SOLN
15.0000 mL | Freq: Once | OROMUCOSAL | Status: AC
  Administered 2020-03-04: 15 mL via ORAL
  Filled 2020-03-04: qty 15

## 2020-03-04 MED ORDER — SODIUM CHLORIDE 0.9 % IV SOLN
510.0000 mg | INTRAVENOUS | Status: AC
  Administered 2020-03-04: 510 mg via INTRAVENOUS
  Filled 2020-03-04: qty 510

## 2020-03-04 MED ORDER — DIPHENHYDRAMINE HCL 25 MG PO CAPS
50.0000 mg | ORAL_CAPSULE | Freq: Once | ORAL | Status: AC
  Administered 2020-03-04: 50 mg via ORAL
  Filled 2020-03-04: qty 2

## 2020-03-04 NOTE — ED Provider Notes (Signed)
[facility] EMERGENCY DEPARTMENT Provider Note   CSN: [id number] Arrival date & time: 03/04/20  1038     History Chief Complaint  Patient presents with  . Chest Pain  . Anxiety    Beth Gibson is a 34 y.o. female.  34 year old female brought in by EMS for possible reaction to an iron infusion today.  Patient states that she had her first iron infusion 1 week ago, states afterwards she felt like she had some indigestion which was cured by taking mustard.  Patient states today she had another iron infusion, waited past monitoring time at the infusion center before leaving and going to get breakfast at [facility].  Patient states while waiting in line she felt her vision became blurry all of a sudden followed by a tightness in her throat and chest and fell like she could not breathe.  Patient states she tried to drive home however could not drive the way she was feeling/felt like she was having a panic attack so she called her mother to meet her where she was and call an ambulance.  Symptoms lasted a few minutes before gradually improving.  Patient states at this time she has a tight feeling in her throat when she swallows otherwise denies tongue swelling, shortness of breath, cough, wheezing, rash or other complaints or concerns.  Patient received iron infusion due to recent heavy vaginal bleeding x2 months, bleeding stopped 1 week ago.  No other complaints or concerns at this time.        Past Medical History:  Diagnosis Date  . Abscess of axilla, right   . Anemia   . Asthma    NO MEDS, SOB with activity  . BV (bacterial vaginosis)   . Chlamydia 2020  . Depression   . Gonorrhea 2020  . Headache(784.0)    MIGRAINES  . History of blood transfusion 2013   with child birth  . PONV (postoperative nausea and vomiting)    nausea  . Preterm labor   . Trichomonas   . UTI (lower urinary tract infection)   . Yeast vaginitis     Patient Active Problem List   Diagnosis Date Noted  . Abscess of right axilla 06/20/2018  . Incomplete spontaneous abortion 02/24/2017  . Status post elective abortion 12/10/2016  . Severe episode of recurrent major depressive disorder, with psychotic features ([place])   . MDD (major depressive disorder) 03/16/2016  . Anemia due to chronic blood loss 11/01/2014  . Hyperkalemia 11/01/2014  . Axillary abscess 10/31/2014  . Obesity (BMI 30-39.9) 12/10/2011  . Previous cesarean delivery, antepartum condition or complication [id number]    Past Surgical History:  Procedure Laterality Date  . CESAREAN SECTION    . CESAREAN SECTION  04/18/2012   Procedure: CESAREAN SECTION;  Surgeon: Guss Bunde, MD;  Location: [facility];  Service: Gynecology;  Laterality: N/A;  repeat  . cyst removed from L buttock    . CYSTECTOMY  2009   left buttock  . DILATION AND EVACUATION N/A 02/24/2017   Procedure: DILATATION AND EVACUATION;  Surgeon: Lavonia Drafts, MD;  Location: [facility];  Service: Gynecology;  Laterality: N/A;  . HYDRADENITIS EXCISION Right 11/04/2018   Procedure: EXCISION HIDRADENITIS RIGHT  AXILLA;  Surgeon: Jovita Kussmaul, MD;  Location: [facility];  Service: General;  Laterality: Right;  . INCISION AND DRAINAGE ABSCESS Right 11/01/2014   Procedure: INCISION AND DRAINAGE RIGHT AXILLARY ABSCESS;  Surgeon: Leighton Ruff, MD;  Location: [facility];  Service:  General;  Laterality: Right;  . INCISION AND DRAINAGE ABSCESS Right 06/21/2018   Procedure: INCISION AND DRAINAGE RIGHT ABSCESS AXILLA;  Surgeon: Jovita Kussmaul, MD;  Location: [facility];  Service: General;  Laterality: Right;  . IRRIGATION AND DEBRIDEMENT ABSCESS Right 08/09/2015   Procedure: IRRIGATION AND DEBRIDEMENT AXILLARY ABSCESS;  Surgeon: Alphonsa Overall, MD;  Location: [facility];  Service: General;  Laterality: Right;  . WISDOM TOOTH EXTRACTION    . WISDOM TOOTH EXTRACTION  2009     OB History    Gravida  5   Para  2   Term  0   Preterm  2   AB  2    Living  2     SAB  1   TAB  0   Ectopic  0   Multiple  0   Live Births  2           Family History  Problem Relation Age of Onset  . Hypertension Mother   . Diabetes Mellitus I Father     Social History   Tobacco Use  . Smoking status: Current Some Day Smoker    Packs/day: 0.25    Years: 0.50    Pack years: 0.12    Types: Cigarettes  . Smokeless tobacco: Never Used  Substance Use Topics  . Alcohol use: No    Alcohol/week: 1.0 standard drinks    Types: 1 Standard drinks or equivalent per week  . Drug use: Yes    Types: Marijuana    Comment: Last use-08/2019    Home Medications Prior to Admission medications   Medication Sig Start Date End Date Taking? Authorizing Provider  ibuprofen (ADVIL) 200 MG tablet Take 200 mg by mouth every 6 (six) hours as needed.    [provider]  megestrol (MEGACE) 40 MG tablet Take 2 tablets by mouth twice daily until bleeding stops, then take 2 tablets by mouth once daily 02/14/20   Anyanwu, Sallyanne Havers, MD  oxyCODONE-acetaminophen (PERCOCET/ROXICET) 5-325 MG tablet Take 1 tablet by mouth every 6 (six) hours as needed for severe pain. 12/25/19   Vanessa Kick, MD    Allergies    Vicodin [hydrocodone-acetaminophen]  Review of Systems   Review of Systems  HENT: Positive for sore throat.   Respiratory: Positive for chest tightness and shortness of breath. Negative for cough.   Cardiovascular: Positive for chest pain.  Gastrointestinal: Negative for abdominal pain, nausea and vomiting.  Genitourinary: Negative for vaginal bleeding.  Musculoskeletal: Negative for arthralgias, joint swelling and myalgias.  Skin: Negative for color change, rash and wound.  Allergic/Immunologic: Negative for immunocompromised state.  All other systems reviewed and are negative.   Physical Exam Updated Vital Signs BP 119/67   Pulse 74   Temp 98.1 F (36.7 C) (Oral)   Resp 18   LMP 03/04/2020   SpO2 100%   Physical Exam Vitals and  nursing note reviewed.  Constitutional:      General: She is not in acute distress.    Appearance: She is obese. She is not ill-appearing or toxic-appearing.  HENT:     Head: Normocephalic and atraumatic.     Mouth/Throat:     Mouth: Mucous membranes are moist.     Pharynx: Oropharynx is clear. No oropharyngeal exudate or posterior oropharyngeal erythema.  Cardiovascular:     Rate and Rhythm: Normal rate and regular rhythm.     Heart sounds: Normal heart sounds.  Pulmonary:     Effort: Pulmonary effort is normal.  Breath sounds: Normal breath sounds. No wheezing.  Abdominal:     Palpations: Abdomen is soft.     Tenderness: There is no abdominal tenderness.  Musculoskeletal:     Cervical back: Neck supple.     Right lower leg: No edema.     Left lower leg: No edema.  Lymphadenopathy:     Cervical: No cervical adenopathy.  Skin:    General: Skin is warm and dry.     Findings: No rash.  Neurological:     Mental Status: She is alert and oriented to person, place, and time.  Psychiatric:        Behavior: Behavior normal.     ED Results / Procedures / Treatments   Labs (all labs ordered are listed, but only abnormal results are displayed) Labs Reviewed  BASIC METABOLIC PANEL - Abnormal; Notable for the following components:      Result Value   CO2 20 (*)    All other components within normal limits  CBC - Abnormal; Notable for the following components:   RBC 5.15 (*)    Hemoglobin 9.2 (*)    HCT 32.9 (*)    MCV 63.9 (*)    MCH 17.9 (*)    MCHC 28.0 (*)    RDW 27.1 (*)    All other components within normal limits  I-STAT BETA HCG BLOOD, ED ([facility], [facility], [facility] ONLY)  TYPE AND SCREEN  ABO/RH  TROPONIN I (HIGH SENSITIVITY)    EKG EKG Interpretation  Date/Time:  Monday March 04 2020 10:42:09 EDT Ventricular Rate:  84 PR Interval:  162 QRS Duration: 82 QT Interval:  366 QTC Calculation: 432 R Axis:   5 Text Interpretation: Normal sinus rhythm Normal ECG No  significant change since last tracing Confirmed by Deno Etienne [phone]) on 03/04/2020 11:06:57 AM   Radiology DG Chest 2 View  Result Date: 03/04/2020 CLINICAL DATA:  Chest pain, shortness of breath EXAM: CHEST - 2 VIEW COMPARISON:  05/19/2008 FINDINGS: The heart size and mediastinal contours are within normal limits. Both lungs are clear. The visualized skeletal structures are unremarkable. IMPRESSION: Normal study. Electronically Signed   By: Rolm Baptise M.D.   On: 03/04/2020 10:59    Procedures Procedures (including critical care time)  Medications Ordered in ED Medications  alum & mag hydroxide-simeth (MAALOX/MYLANTA) 200-200-20 MG/5ML suspension 30 mL (has no administration in time range)    And  lidocaine (XYLOCAINE) 2 % viscous mouth solution 15 mL (has no administration in time range)  diphenhydrAMINE (BENADRYL) capsule 50 mg (50 mg Oral Given 03/04/20 1129)    ED Course  I have reviewed the triage vital signs and the nursing notes.  Pertinent labs & imaging results that were available during my care of the patient were reviewed by me and considered in my medical decision making (see chart for details).  Clinical Course as of Mar 04 1225  Mon Mar 04, 4152  [id number] 34 year old female brought in by EMS for possible reaction to iron infusion today.  Question infusion reaction versus anxiety attack, symptoms had resolved without intervention.  Patient was given Benadryl, continues to report reflux sort of feeling, will give GI cocktail prior to discharge.  Recommend she continue with an iron supplement by mouth and follow-up with her primary care provider.   [LM]    Clinical Course User Index [LM] Roque Lias   MDM Rules/Calculators/A&P  Final Clinical Impression(s) / ED Diagnoses Final diagnoses:  Atypical chest pain    Rx / DC Orders ED Discharge Orders    None       Tacy Learn, PA-C 03/04/20 [place], [place], DO 03/04/20  1334

## 2020-03-04 NOTE — Discharge Instructions (Addendum)
Follow-up with your provider. Recommend continue to take a multivitamin with iron. Return to ER for worsening or concerning symptoms.

## 2020-03-04 NOTE — ED Triage Notes (Signed)
Pt bib ems for sudden onset of chest pain and SOB while standing in line to get breakfast. Pt received an iron infusion today at the day center upstairs for chronic anemia. Pt waited there for 45 minutes after the infusion with no signs of a reaction. Pt appears anxious in triage and states she felt like she was having a panic attack when it started.

## 2020-03-04 NOTE — ED Notes (Signed)
Pt alert and oriented X4. Pt declined wheelchair and is ambulatory to lobby with steady gait.

## 2020-03-25 ENCOUNTER — Ambulatory Visit (INDEPENDENT_AMBULATORY_CARE_PROVIDER_SITE_OTHER): Payer: Medicaid Other | Admitting: Obstetrics and Gynecology

## 2020-03-25 ENCOUNTER — Other Ambulatory Visit: Payer: Self-pay

## 2020-03-25 ENCOUNTER — Encounter: Payer: Self-pay | Admitting: Obstetrics and Gynecology

## 2020-03-25 ENCOUNTER — Other Ambulatory Visit (HOSPITAL_COMMUNITY)
Admission: RE | Admit: 2020-03-25 | Discharge: 2020-03-25 | Disposition: A | Discharge status: Home / Self Care | Payer: Medicaid Other | Source: Ambulatory Visit | Attending: Obstetrics and Gynecology | Admitting: Obstetrics and Gynecology

## 2020-03-25 VITALS — BP 125/79 | HR 98 | Ht 68.0 in | Wt 206.4 lb

## 2020-03-25 DIAGNOSIS — Z01419 Encounter for gynecological examination (general) (routine) without abnormal findings: Secondary | ICD-10-CM | POA: Insufficient documentation

## 2020-03-25 DIAGNOSIS — N938 Other specified abnormal uterine and vaginal bleeding: Secondary | ICD-10-CM | POA: Insufficient documentation

## 2020-03-25 DIAGNOSIS — Z3202 Encounter for pregnancy test, result negative: Secondary | ICD-10-CM | POA: Diagnosis not present

## 2020-03-25 DIAGNOSIS — D500 Iron deficiency anemia secondary to blood loss (chronic): Secondary | ICD-10-CM | POA: Diagnosis present

## 2020-03-25 MED ORDER — DESOGESTREL-ETHINYL ESTRADIOL 0.15-30 MG-MCG PO TABS: 1.0000 | ORAL_TABLET | Freq: Every day | ORAL | 11 refills | Status: DC

## 2020-03-25 MED ORDER — FERROUS SULFATE 325 (65 FE) MG PO TABS: 325.0000 mg | ORAL_TABLET | Freq: Two times a day (BID) | ORAL | 3 refills | Status: DC

## 2020-03-25 NOTE — Progress Notes (Signed)
Here for irreglar bleeding. Is taking her megace. Elevated phq9 - declines to see [facility]- states was just because of the bleeding was making her depressed. Elza Sortor,RN

## 2020-03-25 NOTE — Patient Instructions (Signed)
Health Maintenance, Female Adopting a healthy lifestyle and getting preventive care are important in promoting health and wellness. Ask your health care provider about:  The right schedule for you to have regular tests and exams.  Things you can do on your own to prevent diseases and keep yourself healthy. What should I know about diet, weight, and exercise? Eat a healthy diet   Eat a diet that includes plenty of vegetables, fruits, low-fat dairy products, and lean protein.  Do not eat a lot of foods that are high in solid fats, added sugars, or sodium. Maintain a healthy weight Body mass index (BMI) is used to identify weight problems. It estimates body fat based on height and weight. Your health care provider can help determine your BMI and help you achieve or maintain a healthy weight. Get regular exercise Get regular exercise. This is one of the most important things you can do for your health. Most adults should:  Exercise for at least 150 minutes each week. The exercise should increase your heart rate and make you sweat (moderate-intensity exercise).  Do strengthening exercises at least twice a week. This is in addition to the moderate-intensity exercise.  Spend less time sitting. Even light physical activity can be beneficial. Watch cholesterol and blood lipids Have your blood tested for lipids and cholesterol at 32 years of age, then have this test every 5 years. Have your cholesterol levels checked more often if:  Your lipid or cholesterol levels are high.  You are older than 32 years of age.  You are at high risk for heart disease. What should I know about cancer screening? Depending on your health history and family history, you may need to have cancer screening at various ages. This may include screening for:  Breast cancer.  Cervical cancer.  Colorectal cancer.  Skin cancer.  Lung cancer. What should I know about heart disease, diabetes, and high blood  pressure? Blood pressure and heart disease  High blood pressure causes heart disease and increases the risk of stroke. This is more likely to develop in people who have high blood pressure readings, are of African descent, or are overweight.  Have your blood pressure checked: ? Every 3-5 years if you are 18-39 years of age. ? Every year if you are 40 years old or older. Diabetes Have regular diabetes screenings. This checks your fasting blood sugar level. Have the screening done:  Once every three years after age 40 if you are at a normal weight and have a low risk for diabetes.  More often and at a younger age if you are overweight or have a high risk for diabetes. What should I know about preventing infection? Hepatitis B If you have a higher risk for hepatitis B, you should be screened for this virus. Talk with your health care provider to find out if you are at risk for hepatitis B infection. Hepatitis C Testing is recommended for:  Everyone born from 1945 through 1965.  Anyone with known risk factors for hepatitis C. Sexually transmitted infections (STIs)  Get screened for STIs, including gonorrhea and chlamydia, if: ? You are sexually active and are younger than 32 years of age. ? You are older than 32 years of age and your health care provider tells you that you are at risk for this type of infection. ? Your sexual activity has changed since you were last screened, and you are at increased risk for chlamydia or gonorrhea. Ask your health care provider if   you are at risk.  Ask your health care provider about whether you are at high risk for HIV. Your health care provider may recommend a prescription medicine to help prevent HIV infection. If you choose to take medicine to prevent HIV, you should first get tested for HIV. You should then be tested every 3 months for as long as you are taking the medicine. Pregnancy  If you are about to stop having your period (premenopausal) and  you may become pregnant, seek counseling before you get pregnant.  Take 400 to 800 micrograms (mcg) of folic acid every day if you become pregnant.  Ask for birth control (contraception) if you want to prevent pregnancy. Osteoporosis and menopause Osteoporosis is a disease in which the bones lose minerals and strength with aging. This can result in bone fractures. If you are 65 years old or older, or if you are at risk for osteoporosis and fractures, ask your health care provider if you should:  Be screened for bone loss.  Take a calcium or vitamin D supplement to lower your risk of fractures.  Be given hormone replacement therapy (HRT) to treat symptoms of menopause. Follow these instructions at home: Lifestyle  Do not use any products that contain nicotine or tobacco, such as cigarettes, e-cigarettes, and chewing tobacco. If you need help quitting, ask your health care provider.  Do not use street drugs.  Do not share needles.  Ask your health care provider for help if you need support or information about quitting drugs. Alcohol use  Do not drink alcohol if: ? Your health care provider tells you not to drink. ? You are pregnant, may be pregnant, or are planning to become pregnant.  If you drink alcohol: ? Limit how much you use to 0-1 drink a day. ? Limit intake if you are breastfeeding.  Be aware of how much alcohol is in your drink. In the [place] one drink equals one 12 oz bottle of beer (355 mL), one 5 oz glass of wine (148 mL), or one 1 oz glass of hard liquor (44 mL). General instructions  Schedule regular health, dental, and eye exams.  Stay current with your vaccines.  Tell your health care provider if: ? You often feel depressed. ? You have ever been abused or do not feel safe at home. Summary  Adopting a healthy lifestyle and getting preventive care are important in promoting health and wellness.  Follow your health care provider's instructions about healthy  diet, exercising, and getting tested or screened for diseases.  Follow your health care provider's instructions on monitoring your cholesterol and blood pressure. This information is not intended to replace advice given to you by your health care provider. Make sure you discuss any questions you have with your health care provider. Document Revised: 11/30/2018 Document Reviewed: 11/30/2018 Elsevier Patient Education  2020 Elsevier Inc.  

## 2020-03-25 NOTE — Progress Notes (Signed)
Patient ID: Beth Gibson, female   DOB: 25-Jan-1986, 34 y.o.   MRN: [id number] Ms Pendley presents for eval of DB since she had a EAB last November. Prior to her cycles were monthly, heavy, last 7-8 days. Has used OCP's in the past and tolerated well. She has developed anemia d/t chronic blood loss now. Has received Fereheme x 2 now. Last Hgb was 9.2. Normal U/S except for possible adenomyosis.  Last IC 2 weeks ago  Last pap smear over 3 yrs ago.  Denies any chronic medical problems.  Denies CP, SOB, bowel or bladder dysfunction  PE AF VSS Lungs clear Heart RRR Abd soft + BS  GU Nl EGBUS white vaginal discharge,cervix no lesion, uterus small, mobile no adnexal masses or tenderness  A/P DUB  UPT negative today. Reviewed treatment options. Desires to try OCP's. U/R/B reviewed with pt. To start today. Back up method reviewed with pt. Stop Megace. Information on IUD provided to pt as well. F/U in 4 months

## 2020-03-26 LAB — CYTOLOGY - PAP
Chlamydia: NEGATIVE
Comment: NEGATIVE
Comment: NEGATIVE
Comment: NORMAL
High risk HPV: POSITIVE — AB
Neisseria Gonorrhea: NEGATIVE

## 2020-03-27 ENCOUNTER — Ambulatory Visit: Payer: Medicaid Other | Admitting: Obstetrics and Gynecology

## 2020-03-27 ENCOUNTER — Telehealth: Payer: Self-pay | Admitting: *Deleted

## 2020-03-27 ENCOUNTER — Encounter: Payer: Self-pay | Admitting: Obstetrics and Gynecology

## 2020-03-27 DIAGNOSIS — R87612 Low grade squamous intraepithelial lesion on cytologic smear of cervix (LGSIL): Secondary | ICD-10-CM | POA: Insufficient documentation

## 2020-03-27 NOTE — Telephone Encounter (Addendum)
-----   Message from Chancy Milroy, MD sent at 03/27/2020  9:57 AM EDT ----- Please let Ms Santiesteban, that her pap smear was abnormal and she needs a colpo. Thanks Legrand Como   4/7  1200  Called pt and left VM message stating that I am calling with test result information. Please call back and let us know if detailed information can be left on VM.   4/7 1545  Pt left VM @ 2:02 pm stating that she is returning my call and detailed test result information can be left on her VM. I called pt and left VM stating her Pap was abnormal - this does not mean cancer. She requires further follow up with an office procedure and an appointment will be scheduled. She will be notified of the appt.

## 2020-03-29 LAB — POCT PREGNANCY, URINE: Preg Test, Ur: NEGATIVE

## 2020-05-06 ENCOUNTER — Other Ambulatory Visit: Payer: Self-pay

## 2020-05-06 ENCOUNTER — Ambulatory Visit (INDEPENDENT_AMBULATORY_CARE_PROVIDER_SITE_OTHER): Payer: Medicaid Other | Admitting: Obstetrics and Gynecology

## 2020-05-06 ENCOUNTER — Encounter: Payer: Self-pay | Admitting: Obstetrics and Gynecology

## 2020-05-06 ENCOUNTER — Other Ambulatory Visit (HOSPITAL_COMMUNITY)
Admission: RE | Admit: 2020-05-06 | Discharge: 2020-05-06 | Disposition: A | Discharge status: Home / Self Care | Payer: Medicaid Other | Source: Ambulatory Visit | Attending: Obstetrics and Gynecology | Admitting: Obstetrics and Gynecology

## 2020-05-06 VITALS — BP 127/88 | HR 88 | Ht 68.0 in | Wt 215.0 lb

## 2020-05-06 DIAGNOSIS — Z3202 Encounter for pregnancy test, result negative: Secondary | ICD-10-CM

## 2020-05-06 DIAGNOSIS — R87612 Low grade squamous intraepithelial lesion on cytologic smear of cervix (LGSIL): Secondary | ICD-10-CM

## 2020-05-06 DIAGNOSIS — N3020 Other chronic cystitis without hematuria: Secondary | ICD-10-CM | POA: Insufficient documentation

## 2020-05-06 LAB — POCT PREGNANCY, URINE: Preg Test, Ur: NEGATIVE

## 2020-05-06 MED ORDER — PHENAZOPYRIDINE HCL 200 MG PO TABS: 200.0000 mg | ORAL_TABLET | Freq: Three times a day (TID) | ORAL | 1 refills | Status: DC

## 2020-05-06 MED ORDER — NITROFURANTOIN MONOHYD MACRO 100 MG PO CAPS: 100.0000 mg | ORAL_CAPSULE | Freq: Two times a day (BID) | ORAL | 0 refills | Status: DC

## 2020-05-06 NOTE — Patient Instructions (Signed)
Colposcopy, Care After This sheet gives you information about how to care for yourself after your procedure. Your doctor may also give you more specific instructions. If you have problems or questions, contact your doctor. What can I expect after the procedure? If you did not have a tissue sample removed (did not have a biopsy), you may only have some spotting for a few days. You can go back to your normal activities. If you had a tissue sample removed, it is common to have:  Soreness and pain. This may last for a few days.  Light-headedness.  Mild bleeding from your vagina or dark-colored, grainy discharge from your vagina. This may last for a few days. You may need to wear a sanitary pad.  Spotting for at least 48 hours after the procedure. Follow these instructions at home:   Take over-the-counter and prescription medicines only as told by your doctor. Ask your doctor what medicines you can start taking again. This is very important if you take blood-thinning medicine.  Do not drive or use heavy machinery while taking prescription pain medicine.  For 3 days, or as long as your doctor tells you, avoid: ? Douching. ? Using tampons. ? Having sex.  If you use birth control (contraception), keep using it.  Limit activity for the first day after the procedure. Ask your doctor what activities are safe for you.  It is up to you to get the results of your procedure. Ask your doctor when your results will be ready.  Keep all follow-up visits as told by your doctor. This is important. Contact a doctor if:  You get a skin rash. Get help right away if:  You are bleeding a lot from your vagina. It is a lot of bleeding if you are using more than one pad an hour for 2 hours in a row.  You have clumps of blood (blood clots) coming from your vagina.  You have a fever.  You have chills  You have pain in your lower belly (pelvic area).  You have signs of infection, such as vaginal  discharge that is: ? Different than usual. ? Yellow. ? Bad-smelling.  You have very pain or cramps in your lower belly that do not get better with medicine.  You feel light-headed.  You feel dizzy.  You pass out (faint). Summary  If you did not have a tissue sample removed (did not have a biopsy), you may only have some spotting for a few days. You can go back to your normal activities.  If you had a tissue sample removed, it is common to have mild pain and spotting for 48 hours.  For 3 days, or as long as your doctor tells you, avoid douching, using tampons and having sex.  Get help right away if you have bleeding, very bad pain, or signs of infection. This information is not intended to replace advice given to you by your health care provider. Make sure you discuss any questions you have with your health care provider. Document Revised: 11/19/2017 Document Reviewed: 08/26/2016 Elsevier Patient Education  2020 Elsevier Inc.  

## 2020-05-06 NOTE — Progress Notes (Signed)
    GYNECOLOGY CLINIC COLPOSCOPY PROCEDURE NOTE  34 y.o. [id number] here for colposcopy for low-grade squamous intraepithelial neoplasia (LGSIL - encompassing HPV,mild dysplasia,CIN I) pap smear on 4/21. Discussed role for HPV in cervical dysplasia, need for surveillance.  Patient given informed consent, signed copy in the chart, time out was performed.  Placed in lithotomy position. Cervix viewed with speculum and colposcope after application of acetic acid.   Colposcopy adequate? Yes  acetowhite lesion(s) noted at 12 and 6 o'clock; corresponding biopsies obtained.  ECC specimen obtained. All specimens were labelled and sent to pathology. Monsels applied.   Patient was given post procedure instructions.  Will follow up pathology and manage accordingly.  Routine preventative health maintenance measures emphasized.    Pt also c/o daily lower abd pain. Interfering with ADL's. Pain with intercourse as well.  Bladder tender on exam  A/P LGSIL         Chronic cystitis  Await Bx results. Macrobid and pyridium for cystitis.   Arlina Robes, MD, [place] Attending [place] for [place]

## 2020-05-08 LAB — SURGICAL PATHOLOGY

## 2020-05-09 ENCOUNTER — Telehealth (INDEPENDENT_AMBULATORY_CARE_PROVIDER_SITE_OTHER): Payer: Medicaid Other | Admitting: Lactation Services

## 2020-05-09 DIAGNOSIS — R87619 Unspecified abnormal cytological findings in specimens from cervix uteri: Secondary | ICD-10-CM

## 2020-05-09 NOTE — Telephone Encounter (Signed)
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 She can schedule with me at her convenience.  Thanks Legrand Como

## 2020-05-09 NOTE — Telephone Encounter (Signed)
Called patient and gave her the results of her Colpo. She was informed that it is recommended that she have a LEEP procedure due to the changes in the cervix that is seen.   Patient with questions that were answered about the LEEP and the cervical changes.   Patient report she has just started a new job and will need to wait until after June 11 to have the procedure, informed her that will be fine and to let the office staff know when she is called to schedule her appointment.   Message to front office staff sent to call patient to schedule LEEP with Dr. Rip Harbour.

## 2020-05-22 ENCOUNTER — Telehealth: Payer: Self-pay | Admitting: Obstetrics and Gynecology

## 2020-05-22 NOTE — Telephone Encounter (Signed)
Attempted to contact patient with her LEEP appointment. No answer, left voicemail for patient to give the office a call back for appointment information.

## 2020-05-25 ENCOUNTER — Emergency Department (HOSPITAL_COMMUNITY)
Admission: EM | Admit: 2020-05-25 | Discharge: 2020-05-25 | Disposition: A | Discharge status: Home / Self Care | Payer: Medicaid Other | Attending: Emergency Medicine | Admitting: Emergency Medicine

## 2020-05-25 ENCOUNTER — Encounter (HOSPITAL_COMMUNITY): Payer: Self-pay

## 2020-05-25 ENCOUNTER — Other Ambulatory Visit: Payer: Self-pay

## 2020-05-25 DIAGNOSIS — N939 Abnormal uterine and vaginal bleeding, unspecified: Secondary | ICD-10-CM | POA: Diagnosis not present

## 2020-05-25 DIAGNOSIS — Z5321 Procedure and treatment not carried out due to patient leaving prior to being seen by health care provider: Secondary | ICD-10-CM | POA: Insufficient documentation

## 2020-05-25 NOTE — ED Notes (Signed)
Pt called for room x3, no response.  

## 2020-05-25 NOTE — ED Triage Notes (Signed)
Patient complains of heavy vaginal bleeding for months, [facility] and has had cervical scraping etc. Patient states taking megestrol with no relief. No pregnancy per patient. Alert and oriented

## 2020-06-13 ENCOUNTER — Telehealth (INDEPENDENT_AMBULATORY_CARE_PROVIDER_SITE_OTHER): Payer: Medicaid Other | Admitting: Obstetrics and Gynecology

## 2020-06-13 DIAGNOSIS — R87619 Unspecified abnormal cytological findings in specimens from cervix uteri: Secondary | ICD-10-CM

## 2020-06-13 NOTE — Telephone Encounter (Signed)
Patient called to say she was bleeding and passing blood clots. She is requesting a call back from the nurse.

## 2020-06-14 NOTE — Telephone Encounter (Signed)
Called patient stating I am returning her phone call. Patient states she has been bleeding constantly for months. She reports feeling weak/tired. She states Dr Rip Harbour put her on birth control pills in April for the bleeding but it didn't help. Patient states she took it for 2 weeks and felt like it made her bleeding worse. She went back to taking Megace 80mg  BID- reports this helped for a while but is back to bleeding heavy. She states she has bleed through her clothes multiple days recently even though she wears two pads- reports passing clots the size of a golf ball. Patient would like sooner appt than 7/23 and feels this is urgent. Discussed with patient this appt wouldn't be with Dr Rip Harbour. Patient states that is fine because she felt like he was too rough last time with the procedure and wouldn't give her an extended doctor's note. Patient overbooked for Monday 6/28 @ 835 & informed her of appt. Patient verbalized understanding and states she will not miss the appt. Patient had no questions.

## 2020-06-17 ENCOUNTER — Other Ambulatory Visit: Payer: Self-pay

## 2020-06-17 ENCOUNTER — Ambulatory Visit (INDEPENDENT_AMBULATORY_CARE_PROVIDER_SITE_OTHER): Payer: Medicaid Other | Admitting: Obstetrics & Gynecology

## 2020-06-17 ENCOUNTER — Encounter: Payer: Self-pay | Admitting: Obstetrics & Gynecology

## 2020-06-17 VITALS — BP 135/91 | HR 92 | Ht 68.0 in | Wt 217.5 lb

## 2020-06-17 DIAGNOSIS — N938 Other specified abnormal uterine and vaginal bleeding: Secondary | ICD-10-CM

## 2020-06-17 MED ORDER — TRAMADOL HCL 50 MG PO TABS: 50.0000 mg | ORAL_TABLET | Freq: Four times a day (QID) | ORAL | 0 refills | Status: DC | PRN

## 2020-06-17 MED ORDER — PROMETHAZINE HCL 25 MG PO TABS: 25.0000 mg | ORAL_TABLET | Freq: Four times a day (QID) | ORAL | 2 refills | Status: DC | PRN

## 2020-06-17 MED ORDER — IBUPROFEN 600 MG PO TABS: 600.0000 mg | ORAL_TABLET | Freq: Four times a day (QID) | ORAL | 1 refills | Status: DC | PRN

## 2020-06-17 NOTE — Addendum Note (Signed)
Addended by: Woodroe Mode on: 06/17/2020 09:38 AM   Modules accepted: Orders

## 2020-06-17 NOTE — Progress Notes (Signed)
Per Dr. 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 She can schedule with me at her convenience.  Thanks Legrand Como

## 2020-06-17 NOTE — Patient Instructions (Signed)
Abnormal Uterine Bleeding °Abnormal uterine bleeding means bleeding more than usual from your uterus. It can include: °· Bleeding between periods. °· Bleeding after sex. °· Bleeding that is heavier than normal. °· Periods that last longer than usual. °· Bleeding after you have stopped having your period (menopause). °There are many problems that may cause this. You should see a doctor for any kind of bleeding that is not normal. Treatment depends on the cause of the bleeding. °Follow these instructions at home: °· Watch your condition for any changes. °· Do not use tampons, douche, or have sex, if your doctor tells you not to. °· Change your pads often. °· Get regular well-woman exams. Make sure they include a pelvic exam and cervical cancer screening. °· Keep all follow-up visits as told by your doctor. This is important. °Contact a doctor if: °· The bleeding lasts more than one week. °· You feel dizzy at times. °· You feel like you are going to throw up (nauseous). °· You throw up. °Get help right away if: °· You pass out. °· You have to change pads every hour. °· You have belly (abdominal) pain. °· You have a fever. °· You get sweaty. °· You get weak. °· You passing large blood clots from your vagina. °Summary °· Abnormal uterine bleeding means bleeding more than usual from your uterus. °· There are many problems that may cause this. You should see a doctor for any kind of bleeding that is not normal. °· Treatment depends on the cause of the bleeding. °This information is not intended to replace advice given to you by your health care provider. Make sure you discuss any questions you have with your health care provider. °Document Revised: 12/01/2016 Document Reviewed: 12/01/2016 °Elsevier Patient Education © 2020 Elsevier Inc. ° °

## 2020-06-17 NOTE — Progress Notes (Signed)
Patient ID: Beth Gibson, female   DOB: 1986-10-24, 34 y.o.   MRN: [id number]  Chief Complaint  Patient presents with  . Follow-up   DUB HPI Beth Gibson is a 34 y.o. female.  [id number] No LMP recorded. (Menstrual status: Irregular Periods). Nearly constant DUB despite Megace and trying OCP. She had termination 11/2019. US showed no POC and pregnancy test is negative. HPI  Past Medical History:  Diagnosis Date  . Abscess of axilla, right   . Abscess of right axilla 06/20/2018  . Anemia   . Asthma    NO MEDS, SOB with activity  . BV (bacterial vaginosis)   . Chlamydia 2020  . Depression   . Gonorrhea 2020  . Headache(784.0)    MIGRAINES  . History of blood transfusion 2013   with child birth  . PONV (postoperative nausea and vomiting)    nausea  . Preterm labor   . Previous cesarean delivery, antepartum condition or complication 56/38/7564   Classical at 28 weeks, plan repeat at 36 weeks - classical per [facility]   . Status post elective abortion 12/10/2016  . Trichomonas   . UTI (lower urinary tract infection)   . Yeast vaginitis     Past Surgical History:  Procedure Laterality Date  . CESAREAN SECTION    . CESAREAN SECTION  04/18/2012   Procedure: CESAREAN SECTION;  Surgeon: Guss Bunde, MD;  Location: [facility];  Service: Gynecology;  Laterality: N/A;  repeat  . cyst removed from L buttock    . CYSTECTOMY  2009   left buttock  . DILATION AND EVACUATION N/A 02/24/2017   Procedure: DILATATION AND EVACUATION;  Surgeon: Lavonia Drafts, MD;  Location: [facility];  Service: Gynecology;  Laterality: N/A;  . HYDRADENITIS EXCISION Right 11/04/2018   Procedure: EXCISION HIDRADENITIS RIGHT  AXILLA;  Surgeon: Jovita Kussmaul, MD;  Location: [facility];  Service: General;  Laterality: Right;  . INCISION AND DRAINAGE ABSCESS Right 11/01/2014   Procedure: INCISION AND DRAINAGE RIGHT AXILLARY ABSCESS;  Surgeon: Leighton Ruff, MD;  Location: [facility];  Service: General;   Laterality: Right;  . INCISION AND DRAINAGE ABSCESS Right 06/21/2018   Procedure: INCISION AND DRAINAGE RIGHT ABSCESS AXILLA;  Surgeon: Jovita Kussmaul, MD;  Location: [facility];  Service: General;  Laterality: Right;  . IRRIGATION AND DEBRIDEMENT ABSCESS Right 08/09/2015   Procedure: IRRIGATION AND DEBRIDEMENT AXILLARY ABSCESS;  Surgeon: Alphonsa Overall, MD;  Location: [facility];  Service: General;  Laterality: Right;  . WISDOM TOOTH EXTRACTION    . WISDOM TOOTH EXTRACTION  2009    Family History  Problem Relation Age of Onset  . Hypertension Mother   . Diabetes Mellitus I Father   . Chronic Renal Failure Father     Social History Social History   Tobacco Use  . Smoking status: Current Some Day Smoker    Packs/day: 0.25    Years: 0.50    Pack years: 0.12    Types: Cigarettes  . Smokeless tobacco: Never Used  Vaping Use  . Vaping Use: Never used  Substance Use Topics  . Alcohol use: Yes    Alcohol/week: 1.0 standard drink    Types: 1 Standard drinks or equivalent per week    Comment: occasional  . Drug use: Yes    Types: Marijuana    Comment: Last use-08/2019    Allergies  Allergen Reactions  . Vicodin [Hydrocodone-Acetaminophen] Itching    Pt tolerates percocet    Current Outpatient Medications  Medication Sig Dispense Refill  . ferrous sulfate (FERROUSUL) 325 (65 FE) MG tablet Take 1 tablet (325 mg total) by mouth 2 (two) times daily. 60 tablet 3  . nitrofurantoin, macrocrystal-monohydrate, (MACROBID) 100 MG capsule Take 1 capsule (100 mg total) by mouth 2 (two) times daily. Take 1 tablet at bedtime after above is completed until gone 35 capsule 0  . phenazopyridine (PYRIDIUM) 200 MG tablet Take 1 tablet (200 mg total) by mouth 3 (three) times daily with meals. 21 tablet 1  . desogestrel-ethinyl estradiol (APRI) 0.15-30 MG-MCG tablet Take 1 tablet by mouth daily. (Patient not taking: Reported on 06/17/2020) 1 Package 11  . ibuprofen (ADVIL) 200 MG tablet Take 200 mg by mouth  every 6 (six) hours as needed. (Patient not taking: Reported on 06/17/2020)    . ibuprofen (ADVIL) 600 MG tablet Take 1 tablet (600 mg total) by mouth every 6 (six) hours as needed. 30 tablet 1  . promethazine (PHENERGAN) 25 MG tablet Take 1 tablet (25 mg total) by mouth every 6 (six) hours as needed for nausea or vomiting. 30 tablet 2   No current facility-administered medications for this visit.    Review of Systems Review of Systems  Constitutional: Positive for fatigue.  Gastrointestinal: Negative.   Genitourinary: Positive for menstrual problem, pelvic pain and vaginal bleeding.    Blood pressure (!) 135/91, pulse 92, height 5\' 8"  (1.727 m), weight 217 lb 8 oz (98.7 kg), unknown if currently breastfeeding.  Physical Exam Physical Exam Vitals and nursing note reviewed.  Pulmonary:     Effort: Pulmonary effort is normal.  Abdominal:     General: There is no distension.  Skin:    General: Skin is warm and dry.  Neurological:     Mental Status: She is alert.  Psychiatric:        Mood and Affect: Mood normal.        Behavior: Behavior normal.     Data Reviewed CLINICAL DATA:  Abnormal uterine bleeding, therapeutic abortion December 2020, bleeding since  EXAM: [place]: Both transabdominal and transvaginal ultrasound examinations of the pelvis were performed. Transabdominal technique was performed for global imaging of the pelvis including uterus, ovaries, adnexal regions, and pelvic cul-de-sac. It was necessary to proceed with endovaginal exam following the transabdominal exam to visualize the endometrium and ovaries.  COMPARISON:  11/05/2006  FINDINGS: Uterus  Measurements: 8.6 x 4.7 x 5.6 cm = volume: 119 mL. Anteverted. Nabothian cysts at cervix. Heterogeneous asymmetric thickening of the anterior myometrial wall with poor definition of the adjacent endometrial interface, likely representing uterine  adenomyosis. Rather than a focal leiomyoma. Posterior wall shows similar though less extensive heterogeneity. No discrete mass.  Endometrium  Thickness: 8 mm.  No endometrial fluid or focal abnormality.  Right ovary  Measurements: 3.3 x 1.8 x 3.0 cm = volume: 9.4 mL. Normal morphology without mass. Internal blood flow present on color Doppler imaging.  Left ovary  Measurements: 3.6 x 2.0 x 2.0 cm = volume: 7.5 mL. Normal morphology without mass. Internal blood flow present on color Doppler imaging.  Other findings  No free pelvic fluid.  No adnexal masses.  IMPRESSION: Heterogeneous appearance of the myometrium, with asymmetric thickening of the anterior myometrial wall, appearance favoring uterine adenomyosis.  Remainder of exam unremarkable.   Electronically Signed   By: Lavonia Dana M.D.   On: 02/21/2020 16:20  Assessment DUB unresponsive to current hormonal management. CIN 2-3  Plan OCP 3 po  today then 2 daily until no bleeding then one a day. Nausea and pain medication given. OOW for 2 days ok. LEEP is in 4 weeks    Emeterio Reeve 06/17/2020, 9:18 AM

## 2020-06-27 ENCOUNTER — Telehealth: Payer: Self-pay | Admitting: *Deleted

## 2020-06-27 NOTE — Telephone Encounter (Signed)
Beth Gibson left a message this afternoon she was seen in our office for bleeding and was given a RX for pain. States she is out of the medicine and wants to see if she can get a refill or if she has to see the doctor again. Would like a call back. Jameca Chumley,RN

## 2020-06-28 NOTE — Telephone Encounter (Signed)
Called patient about request for refill. Spoke with Dr. Roselie Awkward and he declines prescribing more narcotics at this time. Patient can have more Ibuprofen if needed.   Called patient and she did not answer. LM for patient to call the office for further questions or concerns as needed.

## 2020-07-08 ENCOUNTER — Telehealth: Payer: Self-pay | Admitting: Obstetrics and Gynecology

## 2020-07-08 NOTE — Telephone Encounter (Signed)
Attempted to reach patient about her appointment being moved because Dr Rip Harbour will not be in the office. Message was left on her voicemail box.

## 2020-07-12 ENCOUNTER — Ambulatory Visit: Payer: Medicaid Other | Admitting: Obstetrics and Gynecology

## 2020-07-22 ENCOUNTER — Ambulatory Visit: Payer: Medicaid Other | Admitting: Obstetrics and Gynecology

## 2020-08-06 ENCOUNTER — Telehealth: Payer: Self-pay | Admitting: Obstetrics and Gynecology

## 2020-08-06 NOTE — Telephone Encounter (Signed)
Patient called in this morning very upset that she missed her appointment on 07-22-2020 state  she was told her appointment was the end of august, I offered her an appointment on 09-02-2020 with Dr.Ervin patient took the appointment but in the mean time she want to speak with a nurse because she have been bleeding a lot and she feel really weak.

## 2020-08-08 NOTE — Telephone Encounter (Signed)
Returned patients call. Patient did not answer. LM for patient to call the office at her convenience if she continues to have questions or concerns.

## 2020-08-23 ENCOUNTER — Encounter: Payer: Self-pay | Admitting: General Practice

## 2020-09-02 ENCOUNTER — Ambulatory Visit: Payer: Medicaid Other | Admitting: Obstetrics and Gynecology

## 2020-09-02 ENCOUNTER — Other Ambulatory Visit: Payer: Self-pay

## 2020-09-02 ENCOUNTER — Emergency Department (HOSPITAL_COMMUNITY)
Admission: EM | Admit: 2020-09-02 | Discharge: 2020-09-02 | Disposition: A | Discharge status: Home / Self Care | Payer: Medicaid Other | Attending: Emergency Medicine | Admitting: Emergency Medicine

## 2020-09-02 DIAGNOSIS — R110 Nausea: Secondary | ICD-10-CM | POA: Diagnosis not present

## 2020-09-02 DIAGNOSIS — U071 COVID-19: Secondary | ICD-10-CM | POA: Diagnosis not present

## 2020-09-02 DIAGNOSIS — F1590 Other stimulant use, unspecified, uncomplicated: Secondary | ICD-10-CM | POA: Insufficient documentation

## 2020-09-02 DIAGNOSIS — R509 Fever, unspecified: Secondary | ICD-10-CM | POA: Diagnosis present

## 2020-09-02 DIAGNOSIS — Z79899 Other long term (current) drug therapy: Secondary | ICD-10-CM | POA: Diagnosis not present

## 2020-09-02 DIAGNOSIS — F17210 Nicotine dependence, cigarettes, uncomplicated: Secondary | ICD-10-CM | POA: Diagnosis not present

## 2020-09-02 DIAGNOSIS — J45909 Unspecified asthma, uncomplicated: Secondary | ICD-10-CM | POA: Diagnosis not present

## 2020-09-02 LAB — POC SARS CORONAVIRUS 2 AG -  ED: SARS Coronavirus 2 Ag: POSITIVE — AB

## 2020-09-02 MED ORDER — ONDANSETRON 4 MG PO TBDP
4.0000 mg | ORAL_TABLET | Freq: Three times a day (TID) | ORAL | 0 refills | Status: DC | PRN
Start: 2020-09-02 — End: 2021-04-03

## 2020-09-02 MED ORDER — ACETAMINOPHEN 500 MG PO TABS
500.0000 mg | ORAL_TABLET | Freq: Four times a day (QID) | ORAL | 0 refills | Status: DC | PRN
Start: 2020-09-02 — End: 2021-04-03

## 2020-09-02 NOTE — Discharge Planning (Signed)
RNCM following for disposition needs. °Umaima Scholten J. Raijon Lindfors, RN, BSN, NCM [phone] ° °

## 2020-09-02 NOTE — ED Provider Notes (Signed)
[facility] EMERGENCY DEPARTMENT Provider Note   CSN: [id number] Arrival date & time: 09/02/20  0756     History Chief Complaint  Patient presents with  . no taste, chills, body aches     Beth Gibson is a 34 y.o. female with history of asthma, depression, migraine headaches presents for evaluation of acute onset, persistent fevers, chills, myalgias, altered taste and smell since Friday 4 days ago.  No known sick contacts.  She is unvaccinated against Covid.  Denies shortness of breath.  Has had a little bit of nausea mostly related to foods "tasting bad".  She states that she is usually a pack-a-day smoker but has cut down considerably over the last few days "because cigarettes taste nasty now".  Denies cough or nasal congestion.  Has been taking NyQuil with a little bit of relief in symptoms.  Dems up to 102 F at home.  The history is provided by the patient.       Past Medical History:  Diagnosis Date  . Abscess of axilla, right   . Abscess of right axilla 06/20/2018  . Anemia   . Asthma    NO MEDS, SOB with activity  . BV (bacterial vaginosis)   . Chlamydia 2020  . Depression   . Gonorrhea 2020  . Headache(784.0)    MIGRAINES  . History of blood transfusion 2013   with child birth  . PONV (postoperative nausea and vomiting)    nausea  . Preterm labor   . Previous cesarean delivery, antepartum condition or complication 70/35/0093   Classical at 28 weeks, plan repeat at 36 weeks - classical per [facility]   . Status post elective abortion 12/10/2016  . Trichomonas   . UTI (lower urinary tract infection)   . Yeast vaginitis     Patient Active Problem List   Diagnosis Date Noted  . Chronic cystitis 05/06/2020  . 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  . DUB (dysfunctional uterine bleeding) 03/25/2020  . Severe episode of recurrent major depressive disorder, with psychotic features ([place])   . MDD (major depressive disorder) 03/16/2016  . Anemia due  to chronic blood loss 11/01/2014  . Obesity (BMI 30-39.9) 12/10/2011    Past Surgical History:  Procedure Laterality Date  . CESAREAN SECTION    . CESAREAN SECTION  04/18/2012   Procedure: CESAREAN SECTION;  Surgeon: Guss Bunde, MD;  Location: [facility];  Service: Gynecology;  Laterality: N/A;  repeat  . cyst removed from L buttock    . CYSTECTOMY  2009   left buttock  . DILATION AND EVACUATION N/A 02/24/2017   Procedure: DILATATION AND EVACUATION;  Surgeon: Lavonia Drafts, MD;  Location: [facility];  Service: Gynecology;  Laterality: N/A;  . HYDRADENITIS EXCISION Right 11/04/2018   Procedure: EXCISION HIDRADENITIS RIGHT  AXILLA;  Surgeon: Jovita Kussmaul, MD;  Location: [facility];  Service: General;  Laterality: Right;  . INCISION AND DRAINAGE ABSCESS Right 11/01/2014   Procedure: INCISION AND DRAINAGE RIGHT AXILLARY ABSCESS;  Surgeon: Leighton Ruff, MD;  Location: [facility];  Service: General;  Laterality: Right;  . INCISION AND DRAINAGE ABSCESS Right 06/21/2018   Procedure: INCISION AND DRAINAGE RIGHT ABSCESS AXILLA;  Surgeon: Jovita Kussmaul, MD;  Location: [facility];  Service: General;  Laterality: Right;  . IRRIGATION AND DEBRIDEMENT ABSCESS Right 08/09/2015   Procedure: IRRIGATION AND DEBRIDEMENT AXILLARY ABSCESS;  Surgeon: Alphonsa Overall, MD;  Location: [facility];  Service: General;  Laterality: Right;  .  WISDOM TOOTH EXTRACTION    . WISDOM TOOTH EXTRACTION  2009     OB History    Gravida  5   Para  2   Term  0   Preterm  2   AB  3   Living  2     SAB  1   TAB  0   Ectopic  0   Multiple  0   Live Births  2           Family History  Problem Relation Age of Onset  . Hypertension Mother   . Diabetes Mellitus I Father   . Chronic Renal Failure Father     Social History   Tobacco Use  . Smoking status: Current Some Day Smoker    Packs/day: 0.25    Years: 0.50    Pack years: 0.12    Types: Cigarettes  . Smokeless tobacco: Never Used  Vaping  Use  . Vaping Use: Never used  Substance Use Topics  . Alcohol use: Yes    Alcohol/week: 1.0 standard drink    Types: 1 Standard drinks or equivalent per week    Comment: occasional  . Drug use: Yes    Types: Marijuana    Comment: Last use-08/2019    Home Medications Prior to Admission medications   Medication Sig Start Date End Date Taking? Authorizing Provider  acetaminophen (TYLENOL) 500 MG tablet Take 1 tablet (500 mg total) by mouth every 6 (six) hours as needed. 09/02/20   Rodell Perna A, PA-C  desogestrel-ethinyl estradiol (APRI) 0.15-30 MG-MCG tablet Take 1 tablet by mouth daily. Patient not taking: Reported on 06/17/2020 03/25/20   Chancy Milroy, MD  ferrous sulfate (FERROUSUL) 325 (65 FE) MG tablet Take 1 tablet (325 mg total) by mouth 2 (two) times daily. 03/25/20   Chancy Milroy, MD  ibuprofen (ADVIL) 200 MG tablet Take 200 mg by mouth every 6 (six) hours as needed. Patient not taking: Reported on 06/17/2020    [provider]  ibuprofen (ADVIL) 600 MG tablet Take 1 tablet (600 mg total) by mouth every 6 (six) hours as needed. 06/17/20   Woodroe Mode, MD  nitrofurantoin, macrocrystal-monohydrate, (MACROBID) 100 MG capsule Take 1 capsule (100 mg total) by mouth 2 (two) times daily. Take 1 tablet at bedtime after above is completed until gone 05/06/20   Chancy Milroy, MD  ondansetron (ZOFRAN ODT) 4 MG disintegrating tablet Take 1 tablet (4 mg total) by mouth every 8 (eight) hours as needed for nausea or vomiting. 09/02/20   Rodell Perna A, PA-C  phenazopyridine (PYRIDIUM) 200 MG tablet Take 1 tablet (200 mg total) by mouth 3 (three) times daily with meals. 05/06/20   Chancy Milroy, MD  promethazine (PHENERGAN) 25 MG tablet Take 1 tablet (25 mg total) by mouth every 6 (six) hours as needed for nausea or vomiting. 06/17/20   Woodroe Mode, MD  traMADol (ULTRAM) 50 MG tablet Take 1 tablet (50 mg total) by mouth every 6 (six) hours as needed. 06/17/20   Woodroe Mode, MD      Allergies    Vicodin [hydrocodone-acetaminophen]  Review of Systems   Review of Systems  Constitutional: Positive for chills and fever.  Respiratory: Negative for cough and shortness of breath.   Cardiovascular: Negative for chest pain.  Gastrointestinal: Positive for nausea. Negative for abdominal pain and vomiting.  Musculoskeletal: Positive for myalgias.  All other systems reviewed and are negative.   Physical Exam Updated  Vital Signs BP 133/88   Pulse 80   Temp 99.2 F (37.3 C) (Oral)   Resp 18   Ht 5\' 8"  (1.727 m)   Wt 98 kg   SpO2 98%   BMI 32.84 kg/m   Physical Exam Vitals and nursing note reviewed.  Constitutional:      General: She is not in acute distress.    Appearance: She is well-developed.     Comments: Resting in bed  HENT:     Head: Normocephalic and atraumatic.  Eyes:     General:        Right eye: No discharge.        Left eye: No discharge.     Conjunctiva/sclera: Conjunctivae normal.  Neck:     Vascular: No JVD.     Trachea: No tracheal deviation.  Cardiovascular:     Rate and Rhythm: Normal rate and regular rhythm.  Pulmonary:     Effort: Pulmonary effort is normal.     Breath sounds: Normal breath sounds.  Chest:     Chest wall: No tenderness.  Abdominal:     General: Bowel sounds are normal. There is no distension.     Palpations: Abdomen is soft.     Tenderness: There is no abdominal tenderness. There is no guarding or rebound.  Musculoskeletal:     Cervical back: Neck supple. No rigidity.  Skin:    General: Skin is warm and dry.     Findings: No erythema.  Neurological:     Mental Status: She is alert.  Psychiatric:        Behavior: Behavior normal.     ED Results / Procedures / Treatments   Labs (all labs ordered are listed, but only abnormal results are displayed) Labs Reviewed  POC SARS CORONAVIRUS 2 AG -  ED - Abnormal; Notable for the following components:      Result Value   SARS Coronavirus 2 Ag POSITIVE (*)     All other components within normal limits    EKG None  Radiology No results found.  Procedures Procedures (including critical care time)  Medications Ordered in ED Medications - No data to display  ED Course  I have reviewed the triage vital signs and the nursing notes.  Pertinent labs & imaging results that were available during my care of the patient were reviewed by me and considered in my medical decision making (see chart for details).    MDM Rules/Calculators/A&P                          Beth Gibson was evaluated in Emergency Department on 09/02/2020 for the symptoms described in the history of present illness. She was evaluated in the context of the global COVID-19 pandemic, which necessitated consideration that the patient might be at risk for infection with the SARS-CoV-2 virus that causes COVID-19. Institutional protocols and algorithms that pertain to the evaluation of patients at risk for COVID-19 are in a state of rapid change based on information released by regulatory bodies including the CDC and federal and state organizations. These policies and algorithms were followed during the patient's care in the ED.  Patient presenting with COVID-19 symptoms beginning 4 days ago.  She is afebrile in the ED, vital signs stable.  She is speaking in full sentences without difficulty and exhibits no increased work of breathing.  Lungs are clear to auscultation bilaterally.  No evidence of respiratory distress.  Covid test  positive in the ED.  She has been tolerating p.o. intake but states that food and drink is now not appealing due to change in taste.  Discussed quarantining at home per current CDC guidelines and symptomatic management.  She was referred to the post Covid care center at [facility] for follow-up outpatient.  Discussed strict ED return precautions.  Patient verbalized understanding of and agreement with plan and is hemodynamically stable for discharge at this  time.  Final Clinical Impression(s) / ED Diagnoses Final diagnoses:  COVID-19    Rx / DC Orders ED Discharge Orders         Ordered    ondansetron (ZOFRAN ODT) 4 MG disintegrating tablet  Every 8 hours PRN        09/02/20 1233    acetaminophen (TYLENOL) 500 MG tablet  Every 6 hours PRN        09/02/20 1233           Renita Papa, PA-C 09/02/20 Wallace, [place], DO 09/02/20 1244

## 2020-09-02 NOTE — Discharge Instructions (Signed)
Your Covid test today was positive.  Please quarantine at home for at least 10 days from symptom onset and 24 hours fever free without the use of ibuprofen or Tylenol.  If at the 10-day mark you have been fever free and your symptoms have improved then you can stop quarantining, otherwise continue to quarantine until those conditions are met.  Take 1 to 2 tablets of Tylenol every 6 hours as needed for fever, aches or pains.  Do not exceed more than 4000 mg of Tylenol daily.  Take Zofran as needed for nausea and vomiting.  Let this medicine dissolve under your tongue and wait around 10 or 15 minutes before you have anything to eat or drink to give this medicine time to work.  Drink plenty of fluids and get plenty of rest.  Call the post Covid care center at [facility] to schedule close follow-up appointment so that they can help with any ongoing symptoms you may have.  They will see you in person which other PCPs are not doing.  Go to [email or web address] schedule an appointment for testing your children.  You can also call [phone] for further assistance.   Return to the emergency department if any concerning signs or symptoms develop such as persistent vomiting, severe shortness of breath or chest pain, loss of consciousness.

## 2020-09-02 NOTE — ED Triage Notes (Signed)
Pt states she woke up Friday with body aches and chills now has no taste. Denies any sob or cp. Unvaccinated has not been tested for covid. Rapid done in triage

## 2020-09-03 ENCOUNTER — Other Ambulatory Visit: Payer: Self-pay | Admitting: Nurse Practitioner

## 2020-09-03 DIAGNOSIS — E669 Obesity, unspecified: Secondary | ICD-10-CM

## 2020-09-03 DIAGNOSIS — U071 COVID-19: Secondary | ICD-10-CM

## 2020-09-03 NOTE — Progress Notes (Signed)
I connected by phone with Beth Gibson on 09/03/2020 at 10:09 AM to discuss the potential use of a new treatment for mild to moderate COVID-19 viral infection in non-hospitalized patients.  This patient is a 34 y.o. female that meets the FDA criteria for Emergency Use Authorization of COVID monoclonal antibody casirivimab/imdevimab.  Has a (+) direct SARS-CoV-2 viral test result  Has mild or moderate COVID-19   Is NOT hospitalized due to COVID-19  Is within 10 days of symptom onset  Has at least one of the high risk factor(s) for progression to severe COVID-19 and/or hospitalization as defined in EUA.  Specific high risk criteria : BMI > 25 and Other high risk medical condition per CDC:  At risk population   I have spoken and communicated the following to the patient or parent/caregiver regarding COVID monoclonal antibody treatment:  1. FDA has authorized the emergency use for the treatment of mild to moderate COVID-19 in adults and pediatric patients with positive results of direct SARS-CoV-2 viral testing who are 29 years of age and older weighing at least 40 kg, and who are at high risk for progressing to severe COVID-19 and/or hospitalization.  2. The significant known and potential risks and benefits of COVID monoclonal antibody, and the extent to which such potential risks and benefits are unknown.  3. Information on available alternative treatments and the risks and benefits of those alternatives, including clinical trials.  4. Patients treated with COVID monoclonal antibody should continue to self-isolate and use infection control measures (e.g., wear mask, isolate, social distance, avoid sharing personal items, clean and disinfect "high touch" surfaces, and frequent handwashing) according to CDC guidelines.   5. The patient or parent/caregiver has the option to accept or refuse COVID monoclonal antibody treatment.  After reviewing this information with the patient, The patient  agreed to proceed with receiving casirivimab\imdevimab infusion and will be provided a copy of the Fact sheet prior to receiving the infusion. Beth Gibson. Beth Gibson 09/03/2020 10:09 AM

## 2020-09-04 ENCOUNTER — Ambulatory Visit (HOSPITAL_COMMUNITY): Payer: Medicaid Other | Attending: Pulmonary Disease

## 2020-09-11 ENCOUNTER — Other Ambulatory Visit: Payer: Medicaid Other

## 2020-09-11 ENCOUNTER — Other Ambulatory Visit: Payer: Self-pay

## 2020-09-11 DIAGNOSIS — Z20822 Contact with and (suspected) exposure to covid-19: Secondary | ICD-10-CM

## 2020-09-13 LAB — NOVEL CORONAVIRUS, NAA: SARS-CoV-2, NAA: NOT DETECTED

## 2020-09-13 LAB — SARS-COV-2, NAA 2 DAY TAT

## 2020-12-14 IMAGING — US US OB < 14 WEEKS - US OB TV
1 series · 15 of 28 positions shown · non-contrast
Comparison: None.

CLINICAL DATA: Abdominal pain

EXAM:
OBSTETRIC <14 WK US AND TRANSVAGINAL OB US
TECHNIQUE: Both transabdominal and transvaginal ultrasound examinations were
performed for complete evaluation of the gestation as well as the
maternal uterus, adnexal regions, and pelvic cul-de-sac.
Transvaginal technique was performed to assess early pregnancy.

[Series 1: us ob < 14 weeks - us ob tv · 15 of 47 slices shown]
[im 1/47]
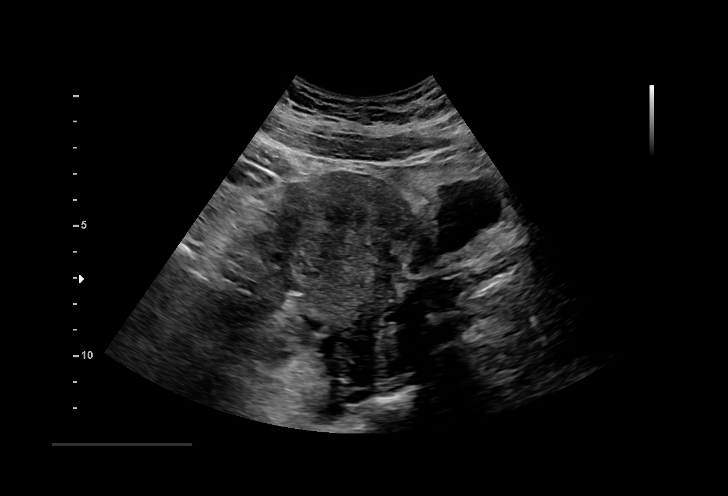
[im 4/47]
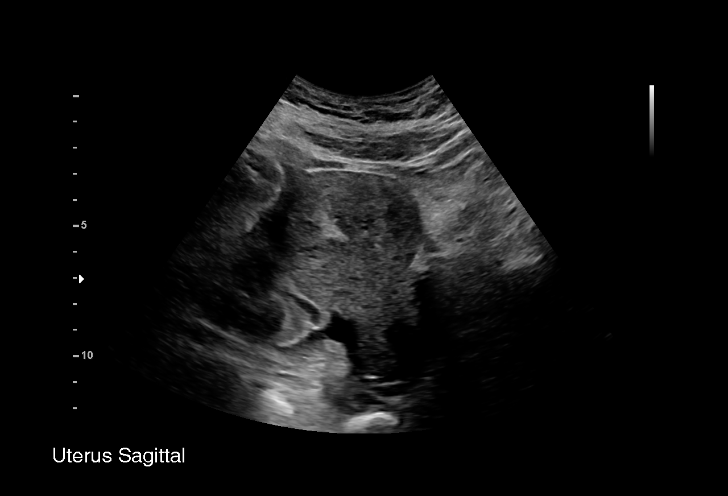
[im 7/47]
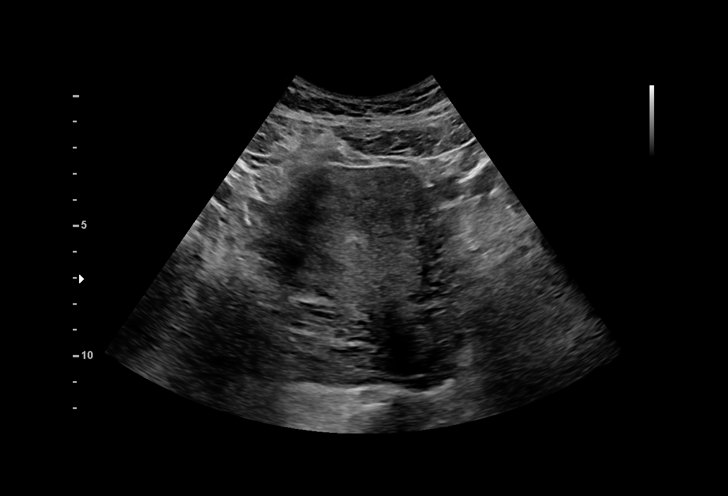
[im 11/47]
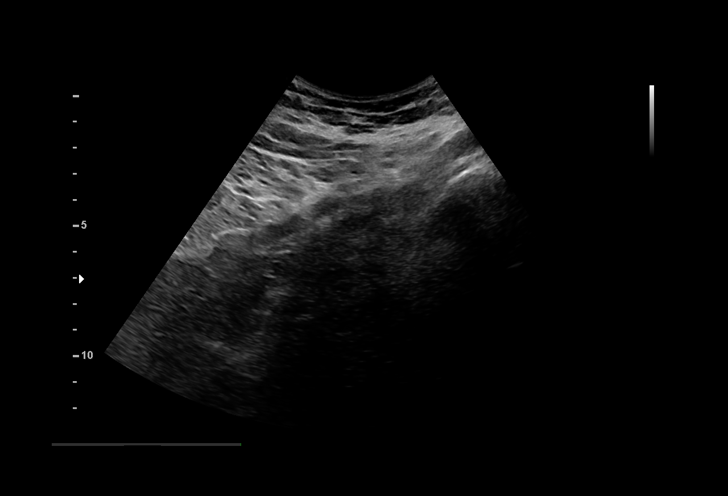
[im 14/47]
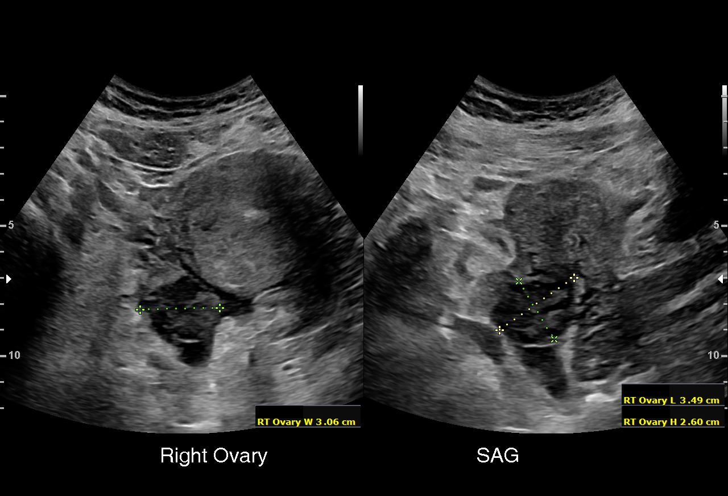
[im 18/47]
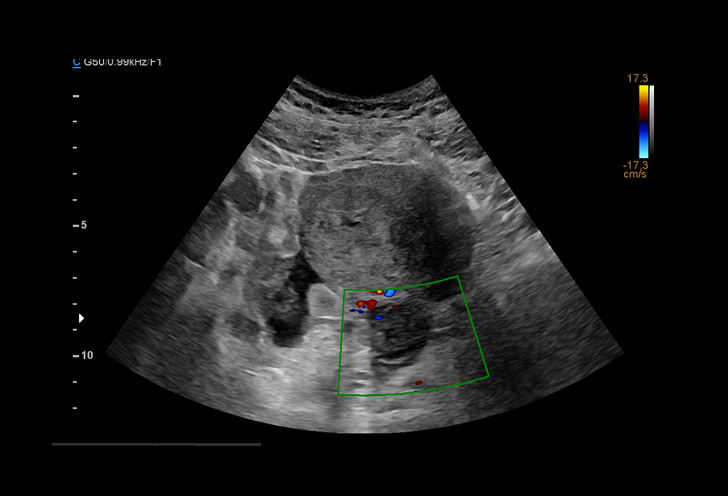
[im 21/47]
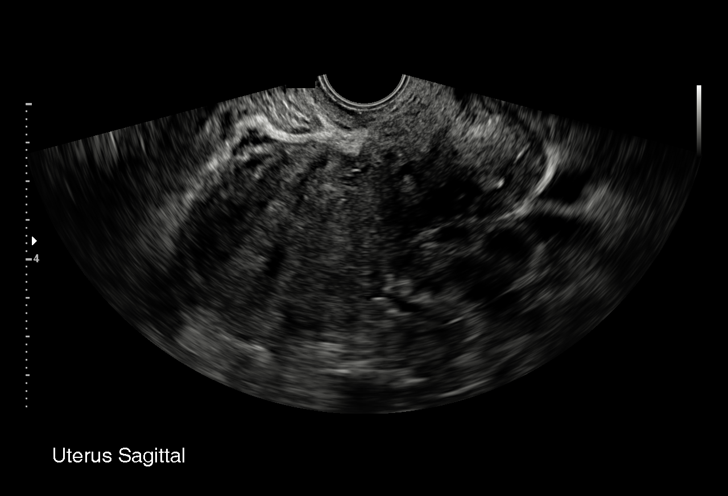
[im 24/47]
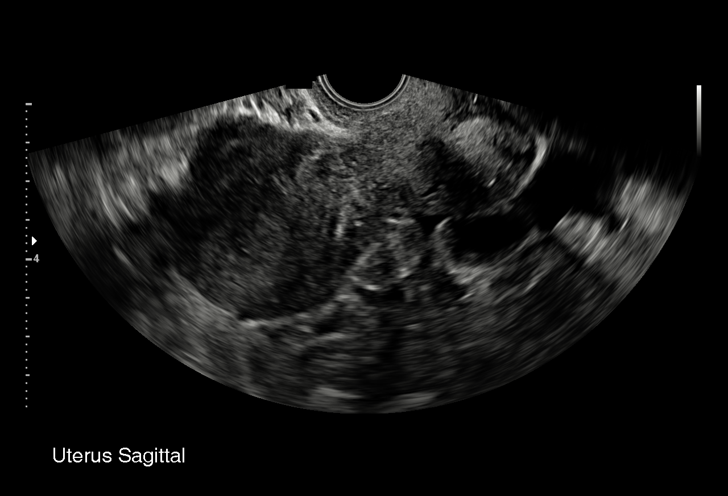
[im 26/47]
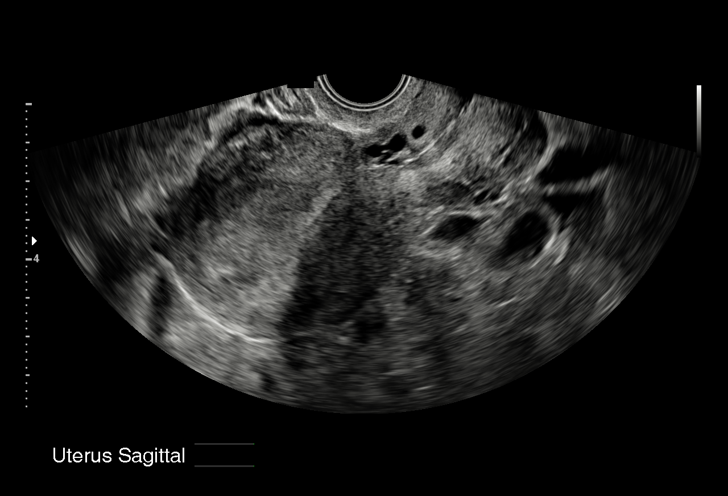
[im 29/47]
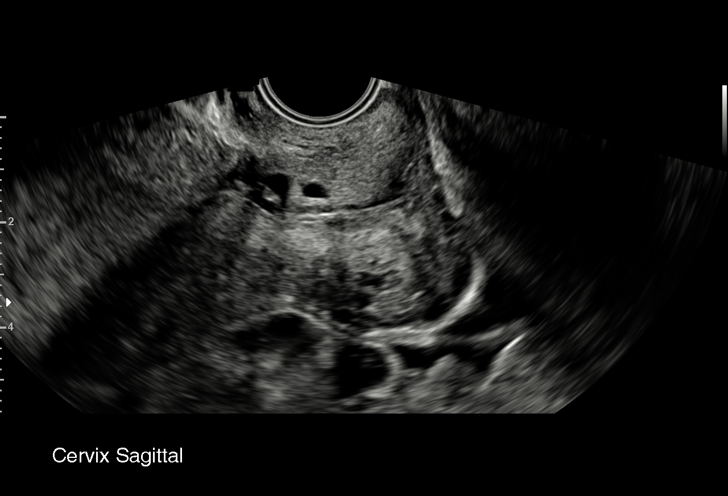
[im 33/47]
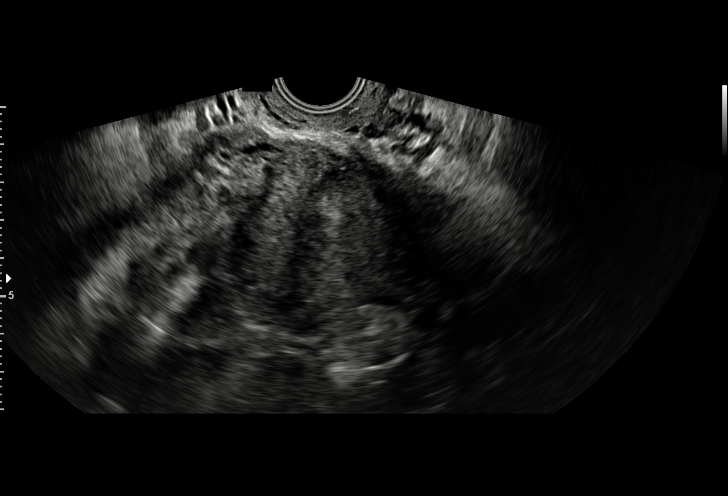
[im 36/47]
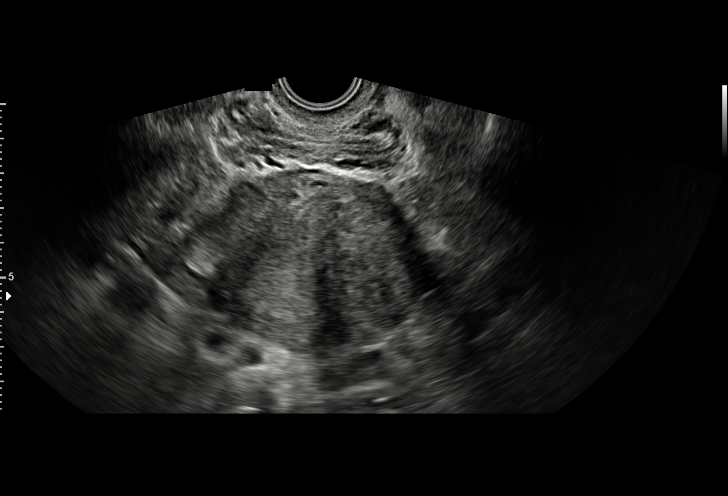
[im 40/47]
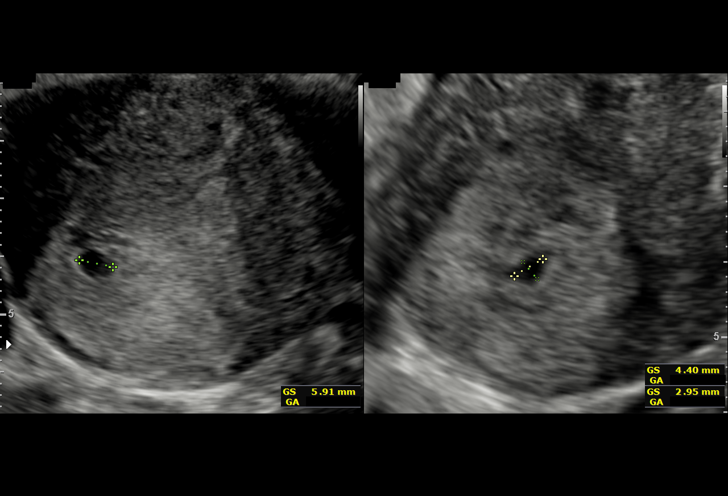
[im 43/47]
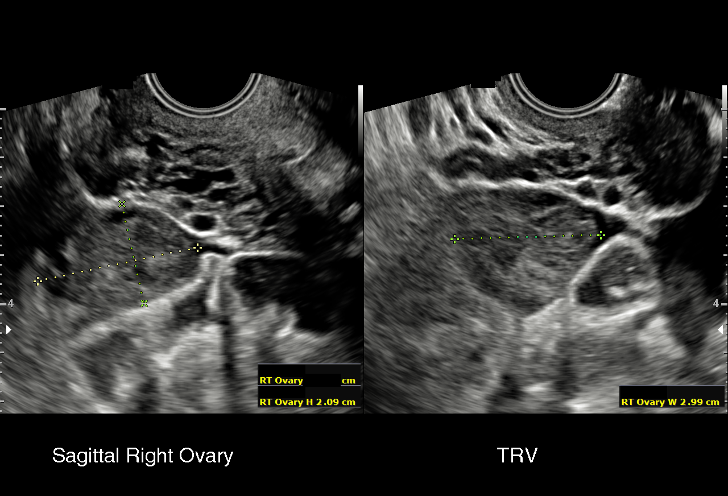
[im 47/47]
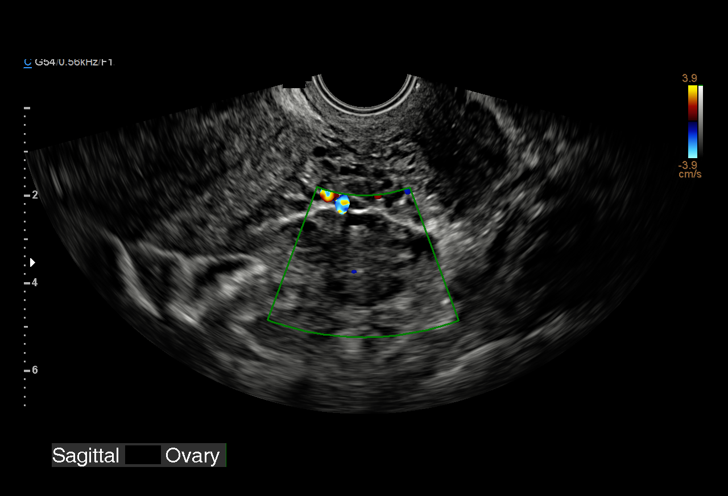

[15 of 28 positions shown; findings below may reference images not displayed]

FINDINGS: Intrauterine gestational sac: Single

Yolk sac:  Not Visualized.

Embryo:  Not Visualized.

Cardiac Activity: Not Visualized.

MSD: 4.4 mm   5 w   1 d

US EDC: 05/09/2020

Subchorionic hemorrhage:  None visualized.

Maternal uterus/adnexae: Within normal limits. Trace free fluid in
the cul-de-sac.
IMPRESSION: Probable early intrauterine gestational sac, but no yolk sac, fetal
pole, or cardiac activity yet visualized. Recommend follow-up
quantitative B-HCG levels and follow-up US in 14 days to assess
viability. This recommendation follows SRU consensus guidelines:
Diagnostic Criteria for Nonviable Pregnancy Early in the First
Trimester. N Engl J Med 9964; [DATE].

## 2021-04-02 ENCOUNTER — Other Ambulatory Visit (HOSPITAL_COMMUNITY): Payer: Self-pay | Admitting: Student

## 2021-04-02 ENCOUNTER — Other Ambulatory Visit: Payer: Self-pay | Admitting: Student

## 2021-04-02 DIAGNOSIS — L0291 Cutaneous abscess, unspecified: Secondary | ICD-10-CM

## 2021-04-03 ENCOUNTER — Observation Stay (HOSPITAL_COMMUNITY)
Admission: AD | Admit: 2021-04-03 | Discharge: 2021-04-03 | Disposition: A | Discharge status: Home / Self Care | Payer: Medicaid Other | Source: Ambulatory Visit | Attending: Surgery | Admitting: Surgery

## 2021-04-03 ENCOUNTER — Encounter (HOSPITAL_COMMUNITY): Payer: Self-pay

## 2021-04-03 ENCOUNTER — Other Ambulatory Visit: Payer: Self-pay

## 2021-04-03 ENCOUNTER — Observation Stay (HOSPITAL_COMMUNITY): Payer: Medicaid Other | Admitting: Anesthesiology

## 2021-04-03 ENCOUNTER — Other Ambulatory Visit: Payer: Self-pay | Admitting: Student

## 2021-04-03 ENCOUNTER — Observation Stay (HOSPITAL_BASED_OUTPATIENT_CLINIC_OR_DEPARTMENT_OTHER)
Admission: RE | Admit: 2021-04-03 | Discharge: 2021-04-03 | Disposition: A | Discharge status: Home / Self Care | Payer: Medicaid Other | Source: Ambulatory Visit | Attending: Student | Admitting: Student

## 2021-04-03 ENCOUNTER — Encounter (HOSPITAL_COMMUNITY): Admission: AD | Disposition: A | Discharge status: Home / Self Care | Payer: Self-pay | Source: Ambulatory Visit

## 2021-04-03 DIAGNOSIS — L02419 Cutaneous abscess of limb, unspecified: Secondary | ICD-10-CM | POA: Diagnosis present

## 2021-04-03 DIAGNOSIS — Z20822 Contact with and (suspected) exposure to covid-19: Secondary | ICD-10-CM | POA: Diagnosis not present

## 2021-04-03 DIAGNOSIS — L0291 Cutaneous abscess, unspecified: Secondary | ICD-10-CM | POA: Insufficient documentation

## 2021-04-03 DIAGNOSIS — L02411 Cutaneous abscess of right axilla: Principal | ICD-10-CM | POA: Insufficient documentation

## 2021-04-03 HISTORY — PX: INCISION AND DRAINAGE ABSCESS: SHX5864

## 2021-04-03 LAB — CBC
HCT: 33.8 % — ABNORMAL LOW (ref 36.0–46.0)
Hemoglobin: 9.6 g/dL — ABNORMAL LOW (ref 12.0–15.0)
MCH: 19.2 pg — ABNORMAL LOW (ref 26.0–34.0)
MCHC: 28.4 g/dL — ABNORMAL LOW (ref 30.0–36.0)
MCV: 67.7 fL — ABNORMAL LOW (ref 80.0–100.0)
Platelets: 158 10*3/uL (ref 150–400)
RBC: 4.99 MIL/uL (ref 3.87–5.11)
RDW: 19.4 % — ABNORMAL HIGH (ref 11.5–15.5)
WBC: 6.6 10*3/uL (ref 4.0–10.5)
nRBC: 0 % (ref 0.0–0.2)

## 2021-04-03 LAB — POCT PREGNANCY, URINE: Preg Test, Ur: NEGATIVE

## 2021-04-03 LAB — BASIC METABOLIC PANEL
Anion gap: 8 (ref 5–15)
BUN: 8 mg/dL (ref 6–20)
CO2: 22 mmol/L (ref 22–32)
Calcium: 8.5 mg/dL — ABNORMAL LOW (ref 8.9–10.3)
Chloride: 105 mmol/L (ref 98–111)
Creatinine, Ser: 0.68 mg/dL (ref 0.44–1.00)
GFR, Estimated: >60 mL/min (ref >60)
Glucose, Bld: 82 mg/dL (ref 70–99)
Potassium: 3.9 mmol/L (ref 3.5–5.1)
Sodium: 135 mmol/L (ref 135–145)

## 2021-04-03 LAB — SARS CORONAVIRUS 2 BY RT PCR (HOSPITAL ORDER, PERFORMED IN ~~LOC~~ HOSPITAL LAB): SARS Coronavirus 2: NEGATIVE

## 2021-04-03 LAB — SURGICAL PCR SCREEN
MRSA, PCR: POSITIVE — AB
Staphylococcus aureus: POSITIVE — AB

## 2021-04-03 LAB — HIV ANTIBODY (ROUTINE TESTING W REFLEX): HIV Screen 4th Generation wRfx: NONREACTIVE

## 2021-04-03 SURGERY — INCISION AND DRAINAGE, ABSCESS
Anesthesia: General | Site: Axilla | Laterality: Right

## 2021-04-03 MED ORDER — CHLORHEXIDINE GLUCONATE 0.12 % MT SOLN
15.0000 mL | Freq: Once | OROMUCOSAL | Status: AC
  Administered 2021-04-03: 15 mL via OROMUCOSAL
  Filled 2021-04-03: qty 15

## 2021-04-03 MED ORDER — MIDAZOLAM HCL 2 MG/2ML IJ SOLN
INTRAMUSCULAR | Status: AC
  Filled 2021-04-03: qty 2

## 2021-04-03 MED ORDER — DEXAMETHASONE SODIUM PHOSPHATE 10 MG/ML IJ SOLN
INTRAMUSCULAR | Status: AC
  Filled 2021-04-03: qty 1

## 2021-04-03 MED ORDER — BUPIVACAINE-EPINEPHRINE (PF) 0.25% -1:200000 IJ SOLN
INTRAMUSCULAR | Status: DC | PRN
  Administered 2021-04-03: 20 mL via PERINEURAL

## 2021-04-03 MED ORDER — MUPIROCIN 2 % EX OINT: 1.0000 "application " | TOPICAL_OINTMENT | Freq: Once | CUTANEOUS | Status: AC

## 2021-04-03 MED ORDER — DEXAMETHASONE SODIUM PHOSPHATE 4 MG/ML IJ SOLN
INTRAMUSCULAR | Status: DC | PRN
  Administered 2021-04-03: 5 mg via INTRAVENOUS

## 2021-04-03 MED ORDER — PROPOFOL 10 MG/ML IV BOLUS
INTRAVENOUS | Status: DC | PRN
  Administered 2021-04-03: 200 mg via INTRAVENOUS

## 2021-04-03 MED ORDER — LIDOCAINE 2% (20 MG/ML) 5 ML SYRINGE
INTRAMUSCULAR | Status: DC | PRN
  Administered 2021-04-03: 60 mg via INTRAVENOUS

## 2021-04-03 MED ORDER — CEFAZOLIN SODIUM-DEXTROSE 2-4 GM/100ML-% IV SOLN
2.0000 g | INTRAVENOUS | Status: AC
  Administered 2021-04-03: 2 g via INTRAVENOUS
  Filled 2021-04-03: qty 100

## 2021-04-03 MED ORDER — LIDOCAINE 2% (20 MG/ML) 5 ML SYRINGE
INTRAMUSCULAR | Status: AC
  Filled 2021-04-03: qty 5

## 2021-04-03 MED ORDER — DEXMEDETOMIDINE (PRECEDEX) IN NS 20 MCG/5ML (4 MCG/ML) IV SYRINGE
PREFILLED_SYRINGE | INTRAVENOUS | Status: DC | PRN
  Administered 2021-04-03 (×2): 8 ug via INTRAVENOUS
  Administered 2021-04-03: 4 ug via INTRAVENOUS
  Administered 2021-04-03: 8 ug via INTRAVENOUS

## 2021-04-03 MED ORDER — ONDANSETRON HCL 4 MG/2ML IJ SOLN
INTRAMUSCULAR | Status: AC
  Filled 2021-04-03: qty 2

## 2021-04-03 MED ORDER — PROPOFOL 10 MG/ML IV BOLUS
INTRAVENOUS | Status: AC
  Filled 2021-04-03: qty 20

## 2021-04-03 MED ORDER — KETOROLAC TROMETHAMINE 30 MG/ML IJ SOLN
INTRAMUSCULAR | Status: AC
  Filled 2021-04-03: qty 1

## 2021-04-03 MED ORDER — KETOROLAC TROMETHAMINE 30 MG/ML IJ SOLN
INTRAMUSCULAR | Status: DC | PRN
  Administered 2021-04-03: 30 mg via INTRAVENOUS

## 2021-04-03 MED ORDER — MIDAZOLAM HCL 5 MG/5ML IJ SOLN
INTRAMUSCULAR | Status: DC | PRN
  Administered 2021-04-03: 2 mg via INTRAVENOUS

## 2021-04-03 MED ORDER — FENTANYL CITRATE (PF) 100 MCG/2ML IJ SOLN
INTRAMUSCULAR | Status: AC
  Filled 2021-04-03: qty 2

## 2021-04-03 MED ORDER — ONDANSETRON HCL 4 MG/2ML IJ SOLN
INTRAMUSCULAR | Status: DC | PRN
  Administered 2021-04-03: 4 mg via INTRAVENOUS

## 2021-04-03 MED ORDER — OXYCODONE-ACETAMINOPHEN 5-325 MG PO TABS: 1.0000 | ORAL_TABLET | ORAL | 0 refills | Status: AC | PRN

## 2021-04-03 MED ORDER — SODIUM CHLORIDE 0.9 % IV SOLN: INTRAVENOUS | Status: DC

## 2021-04-03 MED ORDER — DEXMEDETOMIDINE (PRECEDEX) IN NS 20 MCG/5ML (4 MCG/ML) IV SYRINGE
PREFILLED_SYRINGE | INTRAVENOUS | Status: AC
  Filled 2021-04-03: qty 5

## 2021-04-03 MED ORDER — FENTANYL CITRATE (PF) 250 MCG/5ML IJ SOLN
INTRAMUSCULAR | Status: AC
  Filled 2021-04-03: qty 5

## 2021-04-03 MED ORDER — LACTATED RINGERS IV SOLN: INTRAVENOUS | Status: DC

## 2021-04-03 MED ORDER — ORAL CARE MOUTH RINSE: 15.0000 mL | Freq: Once | OROMUCOSAL | Status: AC

## 2021-04-03 MED ORDER — BUPIVACAINE-EPINEPHRINE (PF) 0.25% -1:200000 IJ SOLN
INTRAMUSCULAR | Status: AC
  Filled 2021-04-03: qty 30

## 2021-04-03 MED ORDER — MUPIROCIN 2 % EX OINT
TOPICAL_OINTMENT | CUTANEOUS | Status: AC
  Administered 2021-04-03: 1 via TOPICAL
  Filled 2021-04-03: qty 22

## 2021-04-03 MED ORDER — CEPHALEXIN 500 MG PO CAPS: 500.0000 mg | ORAL_CAPSULE | Freq: Four times a day (QID) | ORAL | 0 refills | Status: DC

## 2021-04-03 MED ORDER — FENTANYL CITRATE (PF) 100 MCG/2ML IJ SOLN
INTRAMUSCULAR | Status: DC | PRN
  Administered 2021-04-03: 100 ug via INTRAVENOUS
  Administered 2021-04-03 (×2): 50 ug via INTRAVENOUS

## 2021-04-03 MED ORDER — FENTANYL CITRATE (PF) 100 MCG/2ML IJ SOLN
25.0000 ug | INTRAMUSCULAR | Status: DC | PRN
  Administered 2021-04-03 (×3): 50 ug via INTRAVENOUS

## 2021-04-03 SURGICAL SUPPLY — 29 items
APL PRP STRL LF DISP 70% ISPRP (MISCELLANEOUS)
BLADE CLIPPER SURG (BLADE) IMPLANT
BNDG GAUZE ELAST 4 BULKY (GAUZE/BANDAGES/DRESSINGS) IMPLANT
CANISTER SUCT 3000ML PPV (MISCELLANEOUS) ×2 IMPLANT
CHLORAPREP W/TINT 26 (MISCELLANEOUS) IMPLANT
COVER SURGICAL LIGHT HANDLE (MISCELLANEOUS) ×2 IMPLANT
COVER WAND RF STERILE (DRAPES) ×2 IMPLANT
DRAPE LAPAROSCOPIC ABDOMINAL (DRAPES) IMPLANT
DRAPE LAPAROTOMY 100X72 PEDS (DRAPES) IMPLANT
DRSG PAD ABDOMINAL 8X10 ST (GAUZE/BANDAGES/DRESSINGS) IMPLANT
ELECT CAUTERY BLADE 6.4 (BLADE) IMPLANT
ELECT REM PT RETURN 9FT ADLT (ELECTROSURGICAL) ×2
ELECTRODE REM PT RTRN 9FT ADLT (ELECTROSURGICAL) ×1 IMPLANT
GAUZE SPONGE 4X4 12PLY STRL (GAUZE/BANDAGES/DRESSINGS) ×1 IMPLANT
GLOVE BIO SURGEON STRL SZ7 (GLOVE) ×2 IMPLANT
GLOVE BIOGEL PI IND STRL 7.5 (GLOVE) ×1 IMPLANT
GLOVE BIOGEL PI INDICATOR 7.5 (GLOVE) ×1
GOWN STRL REUS W/ TWL LRG LVL3 (GOWN DISPOSABLE) ×2 IMPLANT
GOWN STRL REUS W/TWL LRG LVL3 (GOWN DISPOSABLE) ×4
KIT BASIN OR (CUSTOM PROCEDURE TRAY) ×2 IMPLANT
KIT TURNOVER KIT B (KITS) ×2 IMPLANT
NS IRRIG 1000ML POUR BTL (IV SOLUTION) ×2 IMPLANT
PACK GENERAL/GYN (CUSTOM PROCEDURE TRAY) ×2 IMPLANT
PAD ARMBOARD 7.5X6 YLW CONV (MISCELLANEOUS) ×2 IMPLANT
PENCIL SMOKE EVACUATOR (MISCELLANEOUS) ×2 IMPLANT
SWAB COLLECTION DEVICE MRSA (MISCELLANEOUS) IMPLANT
SWAB CULTURE ESWAB REG 1ML (MISCELLANEOUS) IMPLANT
TOWEL GREEN STERILE (TOWEL DISPOSABLE) ×2 IMPLANT
TOWEL GREEN STERILE FF (TOWEL DISPOSABLE) ×2 IMPLANT

## 2021-04-03 NOTE — H&P (Deleted)
  The note originally documented on this encounter has been moved the the encounter in which it belongs.  

## 2021-04-03 NOTE — Anesthesia Postprocedure Evaluation (Signed)
Anesthesia Post Note  Patient: Beth Gibson  Procedure(s) Performed: INCISION AND DRAINAGE RIGHT AXILLA ABSCESS (Right Axilla)     Patient location during evaluation: PACU Anesthesia Type: General Level of consciousness: awake and alert Pain management: pain level controlled Vital Signs Assessment: post-procedure vital signs reviewed and stable Respiratory status: spontaneous breathing, nonlabored ventilation and respiratory function stable Cardiovascular status: blood pressure returned to baseline and stable Postop Assessment: no apparent nausea or vomiting Anesthetic complications: no   No complications documented.  Last Vitals:  Vitals:   04/03/21 1251 04/03/21 1555  BP: (!) 148/83 (!) 147/91  Pulse: 80 78  Resp: 18 16  Temp: 36.7 C 36.8 C  SpO2: 100% 95%    Last Pain:  Vitals:   04/03/21 1555  TempSrc:   PainSc: 10-Worst pain ever                 Lidia Collum

## 2021-04-03 NOTE — Anesthesia Preprocedure Evaluation (Addendum)
Anesthesia Evaluation  Patient identified by MRN, date of birth, ID band Patient awake    Reviewed: Allergy & Precautions, NPO status , Patient's Chart, lab work & pertinent test results  History of Anesthesia Complications Negative for: history of anesthetic complications  Airway Mallampati: II  TM Distance: >3 FB Neck ROM: Full    Dental  (+) Teeth Intact   Pulmonary asthma , Current Smoker,    Pulmonary exam normal        Cardiovascular negative cardio ROS Normal cardiovascular exam     Neuro/Psych negative neurological ROS     GI/Hepatic negative GI ROS, Neg liver ROS,   Endo/Other  negative endocrine ROS  Renal/GU negative Renal ROS  negative genitourinary   Musculoskeletal negative musculoskeletal ROS (+)   Abdominal   Peds  Hematology negative hematology ROS (+)   Anesthesia Other Findings   Reproductive/Obstetrics                            Anesthesia Physical Anesthesia Plan  ASA: II  Anesthesia Plan: General   Post-op Pain Management:    Induction: Intravenous  PONV Risk Score and Plan: 2 and Ondansetron, Dexamethasone, Midazolam and Treatment may vary due to age or medical condition  Airway Management Planned: LMA  Additional Equipment: None  Intra-op Plan:   Post-operative Plan: Extubation in OR  Informed Consent: I have reviewed the patients History and Physical, chart, labs and discussed the procedure including the risks, benefits and alternatives for the proposed anesthesia with the patient or authorized representative who has indicated his/her understanding and acceptance.     Dental advisory given  Plan Discussed with:   Anesthesia Plan Comments:         Anesthesia Quick Evaluation

## 2021-04-03 NOTE — Op Note (Signed)
Preop diagnosis: Recurrent right axillary abscess Postop diagnosis: Same Procedure performed: Incision and drainage of right axillary abscess (4 x 3 x 3 cm)  Surgeon:Eugene Isadore K Kellsey Sansone Anesthesia: General Indications: This is a 35 year old female who presents with a history of right axillary abscesses over the last 7 years.  She has had 3 previous surgeries.  The most recent was in 2019.  She presented yesterday with right axillary pain and swelling for 2 days.  Ultrasound showed a large abscess in this area.  She is admitted to the hospital now for surgery.  Description of procedure: The patient is brought to the operating room and placed in the supine position on the operating room table.  After an adequate level of general anesthesia was obtained, her right axilla was prepped with ChloraPrep and draped sterile fashion.  A timeout was taken to ensure the proper patient and proper procedure.  The patient has some thick skin scars from her previous surgery.  We excised the largest of the scars.  Directly underneath we encountered a 4 x 3 cm abscess.  There is no tunneling into other portions of the axilla.  We evacuated the abscess thoroughly and sent cultures.  We irrigated the wound thoroughly and used cautery for hemostasis.  The wound is packed with saline moistened gauze.  A dry dressing is applied.  The patient was then extubated and brought to the recovery room in stable condition.  All sponge, instrument, and needle counts are correct.  Imogene Burn. Georgette Dover, MD, [facility] Surgery  General/ Trauma Surgery   04/03/2021 3:46 PM

## 2021-04-03 NOTE — Transfer of Care (Signed)
Immediate Anesthesia Transfer of Care Note  Patient: Beth Gibson  Procedure(s) Performed: INCISION AND DRAINAGE RIGHT AXILLA ABSCESS (Right Axilla)  Patient Location: PACU  Anesthesia Type:General  Level of Consciousness: awake, alert  and oriented  Airway & Oxygen Therapy: Patient Spontanous Breathing and Patient connected to nasal cannula oxygen  Post-op Assessment: Report given to RN and Post -op Vital signs reviewed and stable  Post vital signs: Reviewed and stable  Last Vitals:  Vitals Value Taken Time  BP 147/91 04/03/21 1555  Temp    Pulse 81 04/03/21 1556  Resp 16 04/03/21 1556  SpO2 95 % 04/03/21 1556  Vitals shown include unvalidated device data.  Last Pain:  Vitals:   04/03/21 1334  TempSrc:   PainSc: 10-Worst pain ever      Patients Stated Pain Goal: 4 (17/47/15 9539)  Complications: No complications documented.

## 2021-04-03 NOTE — Discharge Instructions (Signed)
Wet to Dry WOUND CARE: - Change dressing daily - Supplies: sterile saline, 4x4 gauze, tape  1. Remove dressing and all packing carefully, moistening with sterile saline as needed to avoid packing/internal dressing sticking to the wound. 2.   Clean edges of skin around the wound with water/gauze, making sure there is no tape debris or leakage left on skin that could cause skin irritation or breakdown. 3.   Dampen and clean 4x4 gauze with sterile saline and pack wound from wound base to skin level. Wound can be packed loosely.  4.   Cover wound with a dry gauze and secure with tape.  5.   Write the date/time on the dry dressing/tape to better track when the last dressing change occurred. - change dressing as needed if leakage occurs, wound gets contaminated, or patient requests to shower. - You may shower daily with wound open and following the shower the wound should be dried and a clean dressing placed.  - Medical grade tape as well as packing supplies can be found at [employer] on [address] or [employer] on [address]. The remaining supplies can be found at your local drug store, [employer] etc.    Skin Abscess  A skin abscess is an infected area on or under your skin that contains a collection of pus and other material. An abscess may also be called a furuncle, carbuncle, or boil. An abscess can occur in or on almost any part of your body. Some abscesses break open (rupture) on their own. Most continue to get worse unless they are treated. The infection can spread deeper into the body and eventually into your blood, which can make you feel ill. Treatment usually involves draining the abscess. What are the causes? An abscess occurs when germs, like bacteria, pass through your skin and cause an infection. This may be caused by:  A scrape or cut on your skin.  A puncture wound through your skin, including a needle injection or insect bite.  Blocked oil or sweat  glands.  Blocked and infected hair follicles.  A cyst that forms beneath your skin (sebaceous cyst) and becomes infected. What increases the risk? This condition is more likely to develop in people who:  Have a weak body defense system (immune system).  Have diabetes.  Have dry and irritated skin.  Get frequent injections or use illegal IV drugs.  Have a foreign body in a wound, such as a splinter.  Have problems with their lymph system or veins. What are the signs or symptoms? Symptoms of this condition include:  A painful, firm bump under the skin.  A bump with pus at the top. This may break through the skin and drain. Other symptoms include:  Redness surrounding the abscess site.  Warmth.  Swelling of the lymph nodes (glands) near the abscess.  Tenderness.  A sore on the skin. How is this diagnosed? This condition may be diagnosed based on:  A physical exam.  Your medical history.  A sample of pus. This may be used to find out what is causing the infection.  Blood tests.  Imaging tests, such as an ultrasound, CT scan, or MRI. How is this treated? A small abscess that drains on its own may not need treatment. Treatment for larger abscesses may include:  Moist heat or heat pack applied to the area several times a day.  A procedure to drain the abscess (incision and drainage).  Antibiotic medicines. For a severe abscess, you  may first get antibiotics through an IV and then change to antibiotics by mouth. Follow these instructions at home: Medicines  Take over-the-counter and prescription medicines only as told by your health care provider.  If you were prescribed an antibiotic medicine, take it as told by your health care provider. Do not stop taking the antibiotic even if you start to feel better.   Abscess care  If you have an abscess that has not drained, apply heat to the affected area. Use the heat source that your health care provider recommends,  such as a moist heat pack or a heating pad. ? Place a towel between your skin and the heat source. ? Leave the heat on for 20-30 minutes. ? Remove the heat if your skin turns bright red. This is especially important if you are unable to feel pain, heat, or cold. You may have a greater risk of getting burned.  Follow instructions from your health care provider about how to take care of your abscess. Make sure you: ? Cover the abscess with a bandage (dressing). ? Change your dressing or gauze as told by your health care provider. ? Wash your hands with soap and water before you change the dressing or gauze. If soap and water are not available, use hand sanitizer.  Check your abscess every day for signs of a worsening infection. Check for: ? More redness, swelling, or pain. ? More fluid or blood. ? Warmth. ? More pus or a bad smell.   General instructions  To avoid spreading the infection: ? Do not share personal care items, towels, or hot tubs with others. ? Avoid making skin contact with other people.  Keep all follow-up visits as told by your health care provider. This is important. Contact a health care provider if you have:  More redness, swelling, or pain around your abscess.  More fluid or blood coming from your abscess.  Warm skin around your abscess.  More pus or a bad smell coming from your abscess.  A fever.  Muscle aches.  Chills or a general ill feeling. Get help right away if you:  Have severe pain.  See red streaks on your skin spreading away from the abscess. Summary  A skin abscess is an infected area on or under your skin that contains a collection of pus and other material.  A small abscess that drains on its own may not need treatment.  Treatment for larger abscesses may include having a procedure to drain the abscess and taking an antibiotic. This information is not intended to replace advice given to you by your health care provider. Make sure you  discuss any questions you have with your health care provider. Document Revised: 03/30/2019 Document Reviewed: 01/20/2018 Elsevier Patient Education  2021 [employer].

## 2021-04-03 NOTE — Anesthesia Procedure Notes (Signed)
Procedure Name: LMA Insertion Date/Time: 04/03/2021 3:18 PM Performed by: Trinna Post., CRNA Pre-anesthesia Checklist: Patient identified, Emergency Drugs available, Suction available, Patient being monitored and Timeout performed Patient Re-evaluated:Patient Re-evaluated prior to induction Oxygen Delivery Method: Circle system utilized Preoxygenation: Pre-oxygenation with 100% oxygen Induction Type: IV induction Ventilation: Mask ventilation without difficulty LMA: LMA inserted LMA Size: 4.0 Number of attempts: 1 Placement Confirmation: positive ETCO2 and breath sounds checked- equal and bilateral Tube secured with: Tape Dental Injury: Teeth and Oropharynx as per pre-operative assessment

## 2021-04-03 NOTE — H&P (Signed)
Beth Gibson Appointment: 04/02/2021 3:30 PM Location: [facility] Patient #: [id number] DOB: [birth year] Single / Language: Beth Gibson / Race: Black or African American Female   History of Present Illness The patient is a 35 year old female who presents with a subcutaneous abscess. Patient with history of emergent I&D of right axillary abscess in 2015, 2016, and 2019, presenting with recurrent symptoms of axillary pain 2 days. She has been doing warm compresses but has not had any relief. She denies drainage and fever, but states the area is very warm.   Allergies Vicodin *ANALGESICS - OPIOID*  Dermatitis.  Medication History Janeann Forehand, CNA; 04/02/2021 3:31 PM) Percocet (5-325MG  Tablet, 1 (one) Oral every six hours, as needed for pain, Taken starting 05/30/2020) Active. Megestrol Acetate (40MG  Tablet, Oral) Active. Isibloom (0.15-30MG -MCG Tablet, Oral) Active. Ibuprofen (400MG  Tablet, Oral) Active. Medications Reconciled    Review of Systems  General Present- Appetite Loss. Not Present- Chills, Fatigue, Fever, Night Sweats, Weight Gain and Weight Loss. Skin Present- Non-Healing Wounds. Not Present- Change in Wart/Mole, Dryness, Hives, Jaundice, New Lesions, Rash and Ulcer. HEENT Present- Seasonal Allergies. Not Present- Earache, Hearing Loss, Hoarseness, Nose Bleed, Oral Ulcers, Ringing in the Ears, Sinus Pain, Sore Throat, Visual Disturbances, Wears glasses/contact lenses and Yellow Eyes. Respiratory Not Present- Bloody sputum, Chronic Cough, Difficulty Breathing, Snoring and Wheezing. Breast Not Present- Breast Mass, Breast Pain, Nipple Discharge and Skin Changes. Cardiovascular Not Present- Chest Pain, Difficulty Breathing Lying Down, Leg Cramps, Palpitations, Rapid Heart Rate, Shortness of Breath and Swelling of Extremities. Gastrointestinal Not Present- Abdominal Pain, Bloating, Bloody Stool, Change in Bowel Habits, Chronic diarrhea, Constipation, Difficulty  Swallowing, Excessive gas, Gets full quickly at meals, Hemorrhoids, Indigestion, Nausea, Rectal Pain and Vomiting. Female Genitourinary Not Present- Frequency, Nocturia, Painful Urination, Pelvic Pain and Urgency. Musculoskeletal Present- Joint Stiffness, Muscle Pain, Muscle Weakness and Swelling of Extremities. Not Present- Back Pain and Joint Pain. Neurological Not Present- Decreased Memory, Fainting, Headaches, Numbness, Seizures, Tingling, Tremor, Trouble walking and Weakness. Psychiatric Present- Change in Sleep Pattern. Not Present- Anxiety, Bipolar, Depression, Fearful and Frequent crying. Endocrine Not Present- Cold Intolerance, Excessive Hunger, Hair Changes, Heat Intolerance, Hot flashes and New Diabetes. Hematology Not Present- Easy Bruising, Excessive bleeding, Gland problems, HIV and Persistent Infections.  Vitals 04/02/2021 3:31 PM Weight: 216.38 lb Height: 68in Body Surface Area: 2.11 m Body Mass Index: 32.9 kg/m  Temp.: 98.81F  Pulse: 108 (Regular)  P.OX: 99% (Room air) BP: 140/90(Sitting, Left Arm, Standard)   Physical Exam GENERAL: Well-developed, well nourished female in no acute distress  EYES: No scleral icterus Pupils equal, lids normal   MUSCULOSKELETAL: Normal gait Grossly normal ROM upper extremities Grossly normal ROM lower extremities  SKIN: Warm and dry Not diaphoretic  PSYCHIATRIC: Normal judgement and insight Normal mood and affect Alert, oriented x 3  Integumentary Note: Right axilla: There are several scars in the axilla. Inferior to the axilla, there is exquisite tenderness to palpation, making the exam difficult No obvious cellulitis, fluctuance, or drainage   Assessment & Plan PAIN IN AXILLA, RIGHT (R67.893) Impression: Patient with known history of deep axillary abscesses requiring I&D and OR, presenting with right axillary pain 2 days. On exam, she is too tender for a full exam. Dr. Zenia Resides also evaluated the patient and  agrees with ordering an ultrasound and then possibly going to the operating room based on the results. The patient also agrees with this plan. Ultrasound will be performed tomorrow morning. If she develops any worsening issues in the meantime,  she is aware she should go to the emergency room.  Signed by Kellie Shropshire, PA C (04/02/2021 5:10 PM)   Addendum: Ultrasound right axilla showed 6.0 2.0 5.1 cm fluid collection in the superficial soft tissue. She will be taken to the OR later today for I&D.  She provides her understanding of the plan.  Carlena Hurl, PA-C 04/03/21

## 2021-04-04 ENCOUNTER — Encounter (HOSPITAL_COMMUNITY): Payer: Self-pay | Admitting: Surgery

## 2021-04-07 LAB — AEROBIC/ANAEROBIC CULTURE W GRAM STAIN (SURGICAL/DEEP WOUND): Culture: NORMAL

## 2021-04-07 NOTE — Discharge Summary (Signed)
    Patient ID: Beth Gibson [id number] 22-Oct-1986 35 y.o.  Admit date: 04/03/2021 Discharge date: 04/01/2021  Admitting Diagnosis: Recurrent right axillary abscess  Discharge Diagnosis Recurrent right axillary abscess  Consultants None  H&P: The patient is a 35 year old female who presents with a subcutaneous abscess. Patient with history of emergent I&D of right axillary abscess in 2015, 2016, and 2019, presenting with recurrent symptoms of axillary pain 2 days. She has been doing warm compresses but has not had any relief. She denies drainage and fever, but states the area is very warm.  Procedures Dr. Georgette Dover - Incision and drainage of right axillary abscess (4 x 3 x 3 cm) - 04/03/2021  Hospital Course:  Beth Gibson is a 35 y.o. female who presented to the office with right axillary pain. She underwent ultrasound that showed complex fluid collection within the soft tissues of the right axilla measuring up to 6.0 cm, compatible with abscess. She was directed to the hospital and underwent incision and drainage of right axillary abscess (4 x 3 x 3 cm) by Dr. Georgette Dover. She was discharged following the procedure. Follow up as arranged below.    Allergies as of 04/03/2021      Reactions   Vicodin [hydrocodone-acetaminophen] Itching   Pt tolerates percocet      Medication List    STOP taking these medications   acetaminophen 500 MG tablet Commonly known as: TYLENOL   ferrous sulfate 325 (65 FE) MG tablet Commonly known as: FerrouSul   nitrofurantoin (macrocrystal-monohydrate) 100 MG capsule Commonly known as: MACROBID   ondansetron 4 MG disintegrating tablet Commonly known as: Zofran ODT   phenazopyridine 200 MG tablet Commonly known as: Pyridium   promethazine 25 MG tablet Commonly known as: PHENERGAN   traMADol 50 MG tablet Commonly known as: ULTRAM     TAKE these medications   cephALEXin 500 MG capsule Commonly known as: KEFLEX Take 1 capsule (500 mg total) by  mouth 4 (four) times daily.   desogestrel-ethinyl estradiol 0.15-30 MG-MCG tablet Commonly known as: Apri Take 1 tablet by mouth daily.   ibuprofen 600 MG tablet Commonly known as: ADVIL Take 1 tablet (600 mg total) by mouth every 6 (six) hours as needed. What changed: reasons to take this   oxyCODONE-acetaminophen 5-325 MG tablet Commonly known as: Percocet Take 1 tablet by mouth every 4 (four) hours as needed for severe pain.            Discharge Care Instructions  (From admission, onward)         Start     Ordered   04/03/21 0000  Discharge wound care:       Comments: Cleanse with NS, pat gently dry. Fill defects with saline moistened 4x4 gauze, top with dry gauze and secure with tape. Change PRN for soiling, otherwise daily.   04/03/21 [facility], PA On 04/17/2021.   Specialty: General Surgery Why: 4pm. Please arrive 30 minutes prior to your appointment for paperwork. Please bring a copy of your photo ID and insurance card. For wound re-check Contact information: [address] [place] [phone]              Signed: Alferd Apa, [facility] 04/07/2021, 3:40 PM Please see Amion for pager number during day hours 7:00am-4:30pm

## 2021-04-08 LAB — AEROBIC/ANAEROBIC CULTURE W GRAM STAIN (SURGICAL/DEEP WOUND)

## 2021-04-09 ENCOUNTER — Encounter (HOSPITAL_COMMUNITY): Payer: Self-pay | Admitting: Surgery

## 2021-05-29 IMAGING — US US PELVIS COMPLETE WITH TRANSVAGINAL
1 series · 15 of 25 positions shown · non-contrast
Comparison: 11/05/2006

CLINICAL DATA: Abnormal uterine bleeding, therapeutic abortion
November 2019, bleeding since

EXAM:
TRANSABDOMINAL AND TRANSVAGINAL ULTRASOUND OF PELVIS
TECHNIQUE: Both transabdominal and transvaginal ultrasound examinations of the
pelvis were performed. Transabdominal technique was performed for
global imaging of the pelvis including uterus, ovaries, adnexal
regions, and pelvic cul-de-sac. It was necessary to proceed with
endovaginal exam following the transabdominal exam to visualize the
endometrium and ovaries.

[Series 1: us pelvis complete with transvaginal · 15 of 84 slices shown]
[im 1/84]
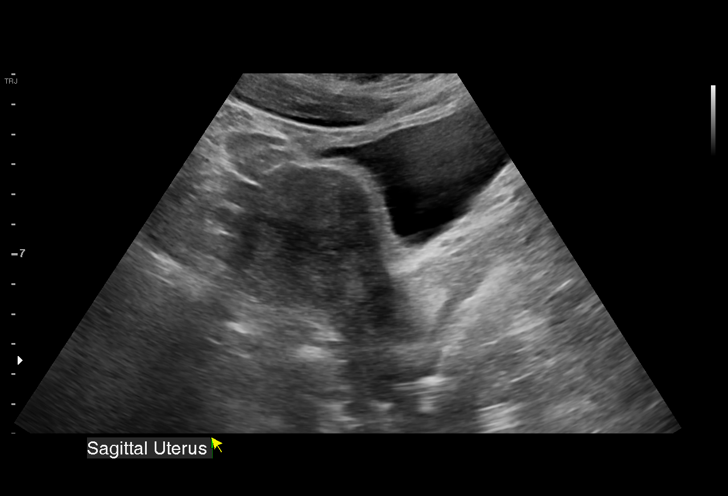
[im 7/84]
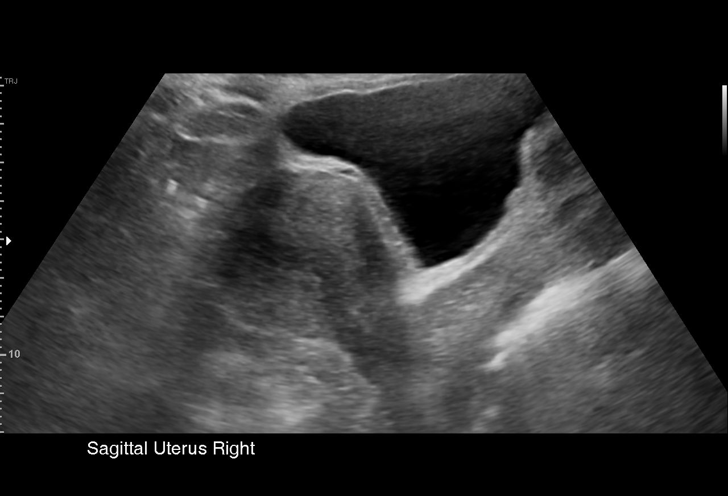
[im 14/84]
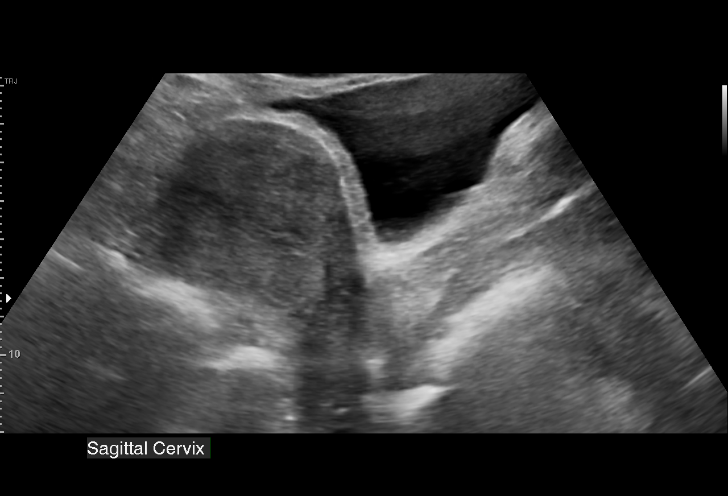
[im 18/84]
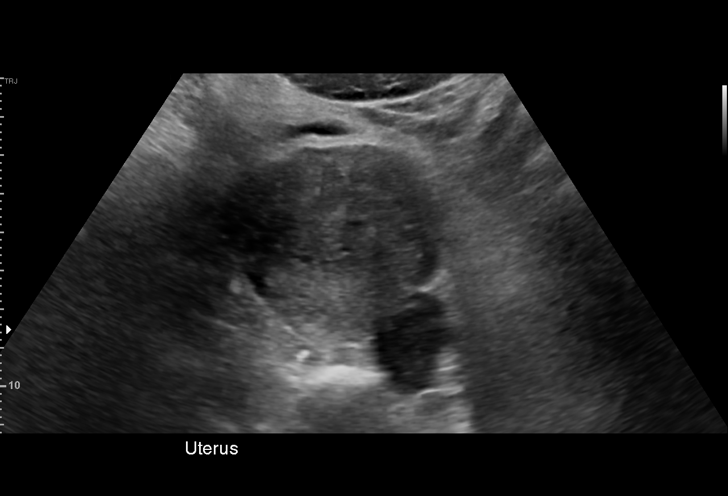
[im 25/84]
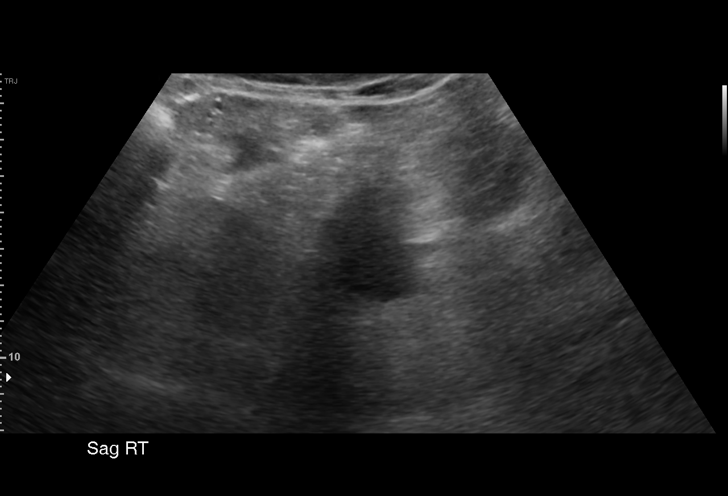
[im 32/84]
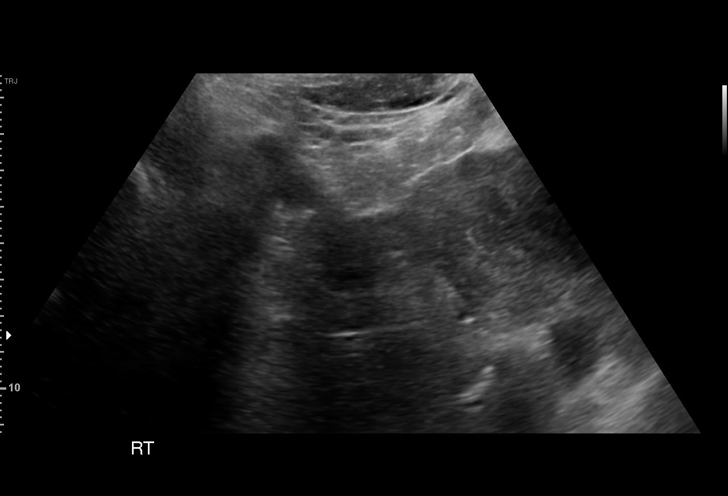
[im 35/84]
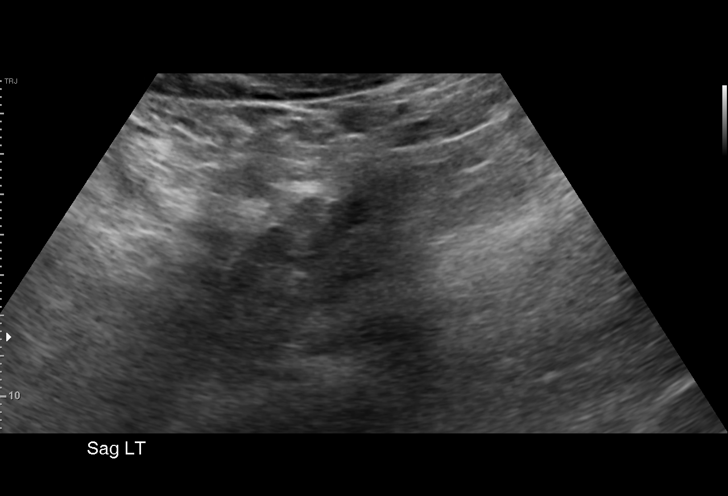
[im 42/84]
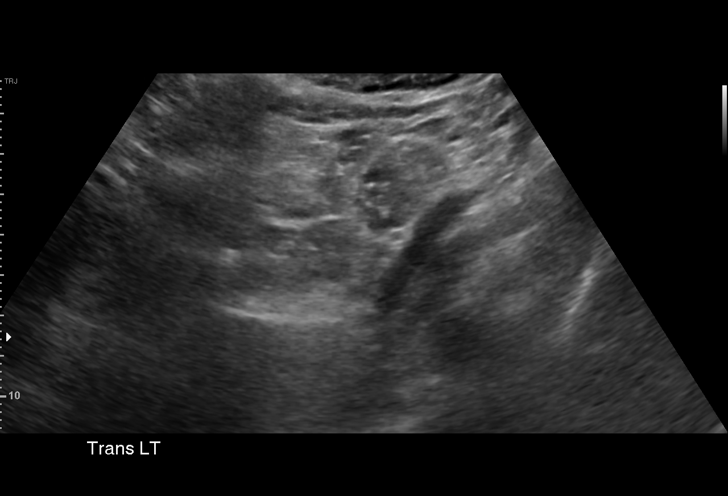
[im 49/84]
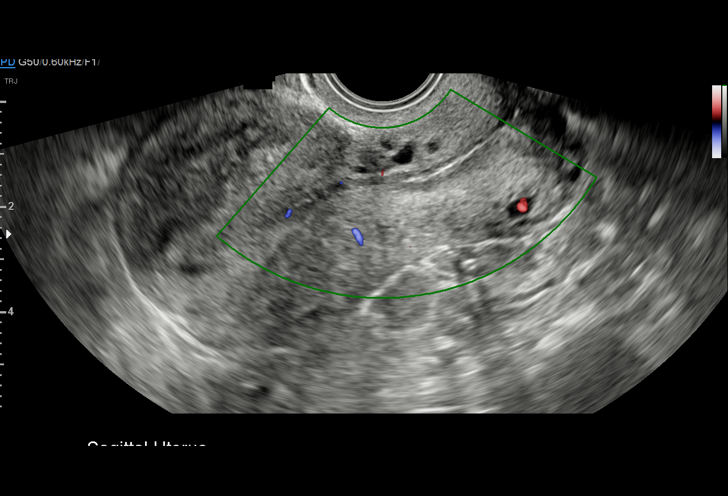
[im 52/84]
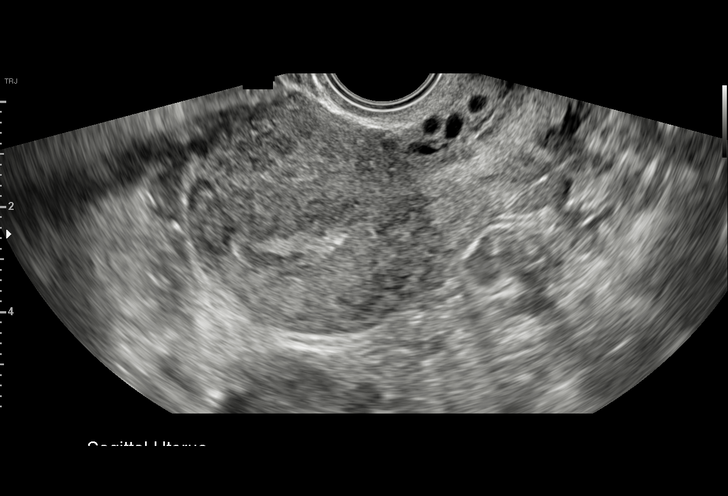
[im 59/84]
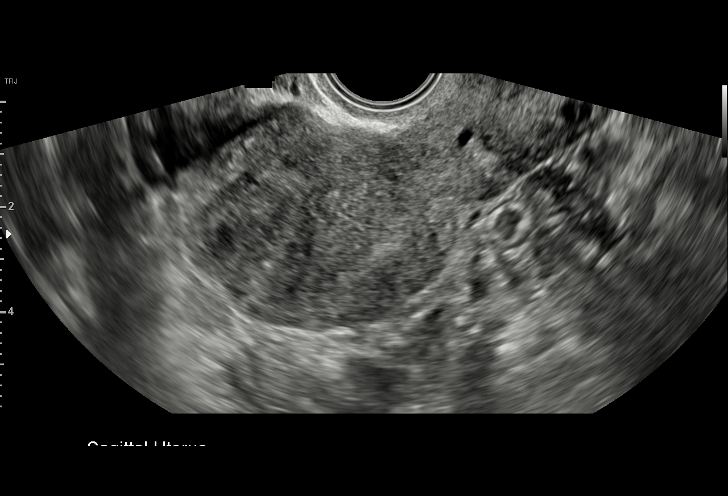
[im 66/84]
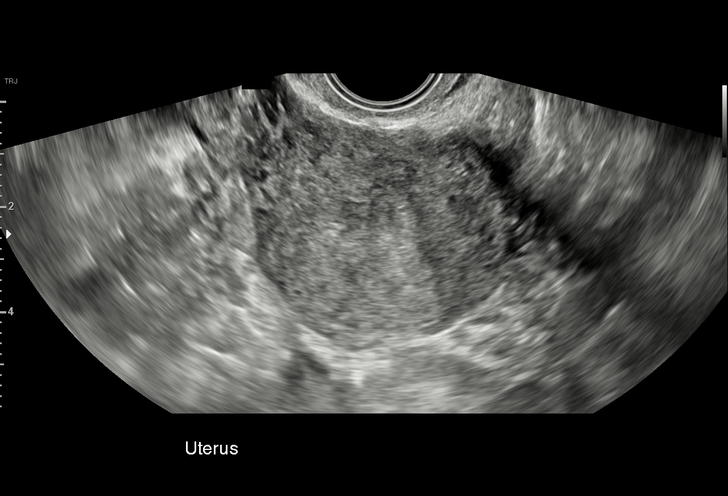
[im 70/84]
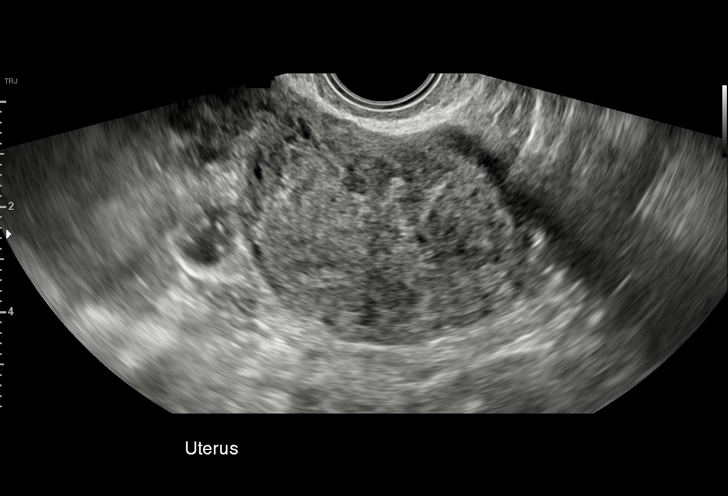
[im 77/84]
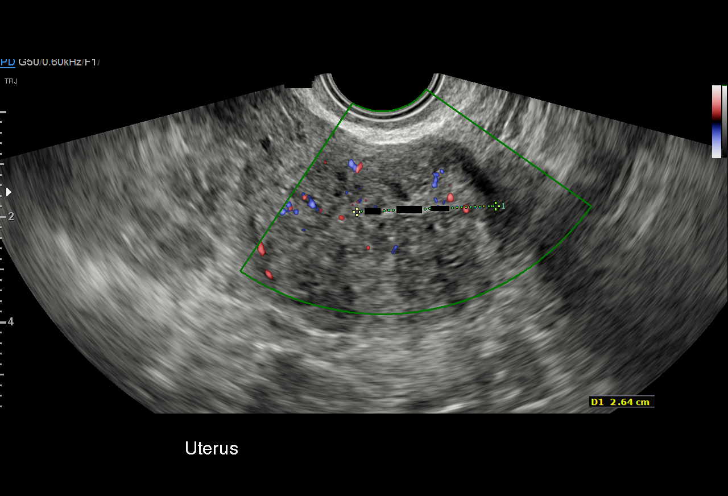
[im 84/84]
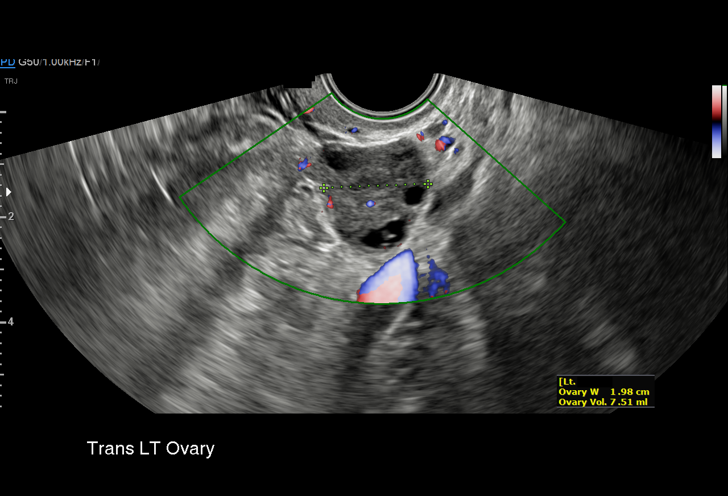

[15 of 25 positions shown; findings below may reference images not displayed]

FINDINGS: Uterus

Measurements: 8.6 x 4.7 x 5.6 cm = volume: 119 mL. Anteverted.
Nabothian cysts at cervix. Heterogeneous asymmetric thickening of
the anterior myometrial wall with poor definition of the adjacent
endometrial interface, likely representing uterine adenomyosis.
Rather than a focal leiomyoma. Posterior wall shows similar though
less extensive heterogeneity. No discrete mass.

Endometrium

Thickness: 8 mm.  No endometrial fluid or focal abnormality.

Right ovary

Measurements: 3.3 x 1.8 x 3.0 cm = volume: 9.4 mL. Normal morphology
without mass. Internal blood flow present on color Doppler imaging.

Left ovary

Measurements: 3.6 x 2.0 x 2.0 cm = volume: 7.5 mL. Normal morphology
without mass. Internal blood flow present on color Doppler imaging.

Other findings

No free pelvic fluid.  No adnexal masses.
IMPRESSION: Heterogeneous appearance of the myometrium, with asymmetric
thickening of the anterior myometrial wall, appearance favoring
uterine adenomyosis.

Remainder of exam unremarkable.

## 2021-06-10 IMAGING — DX DG CHEST 2V
2 series · 2 of 2 positions shown · non-contrast
Comparison: 05/19/2008

CLINICAL DATA: Chest pain, shortness of breath

EXAM:
CHEST - 2 VIEW

[w chest pa]
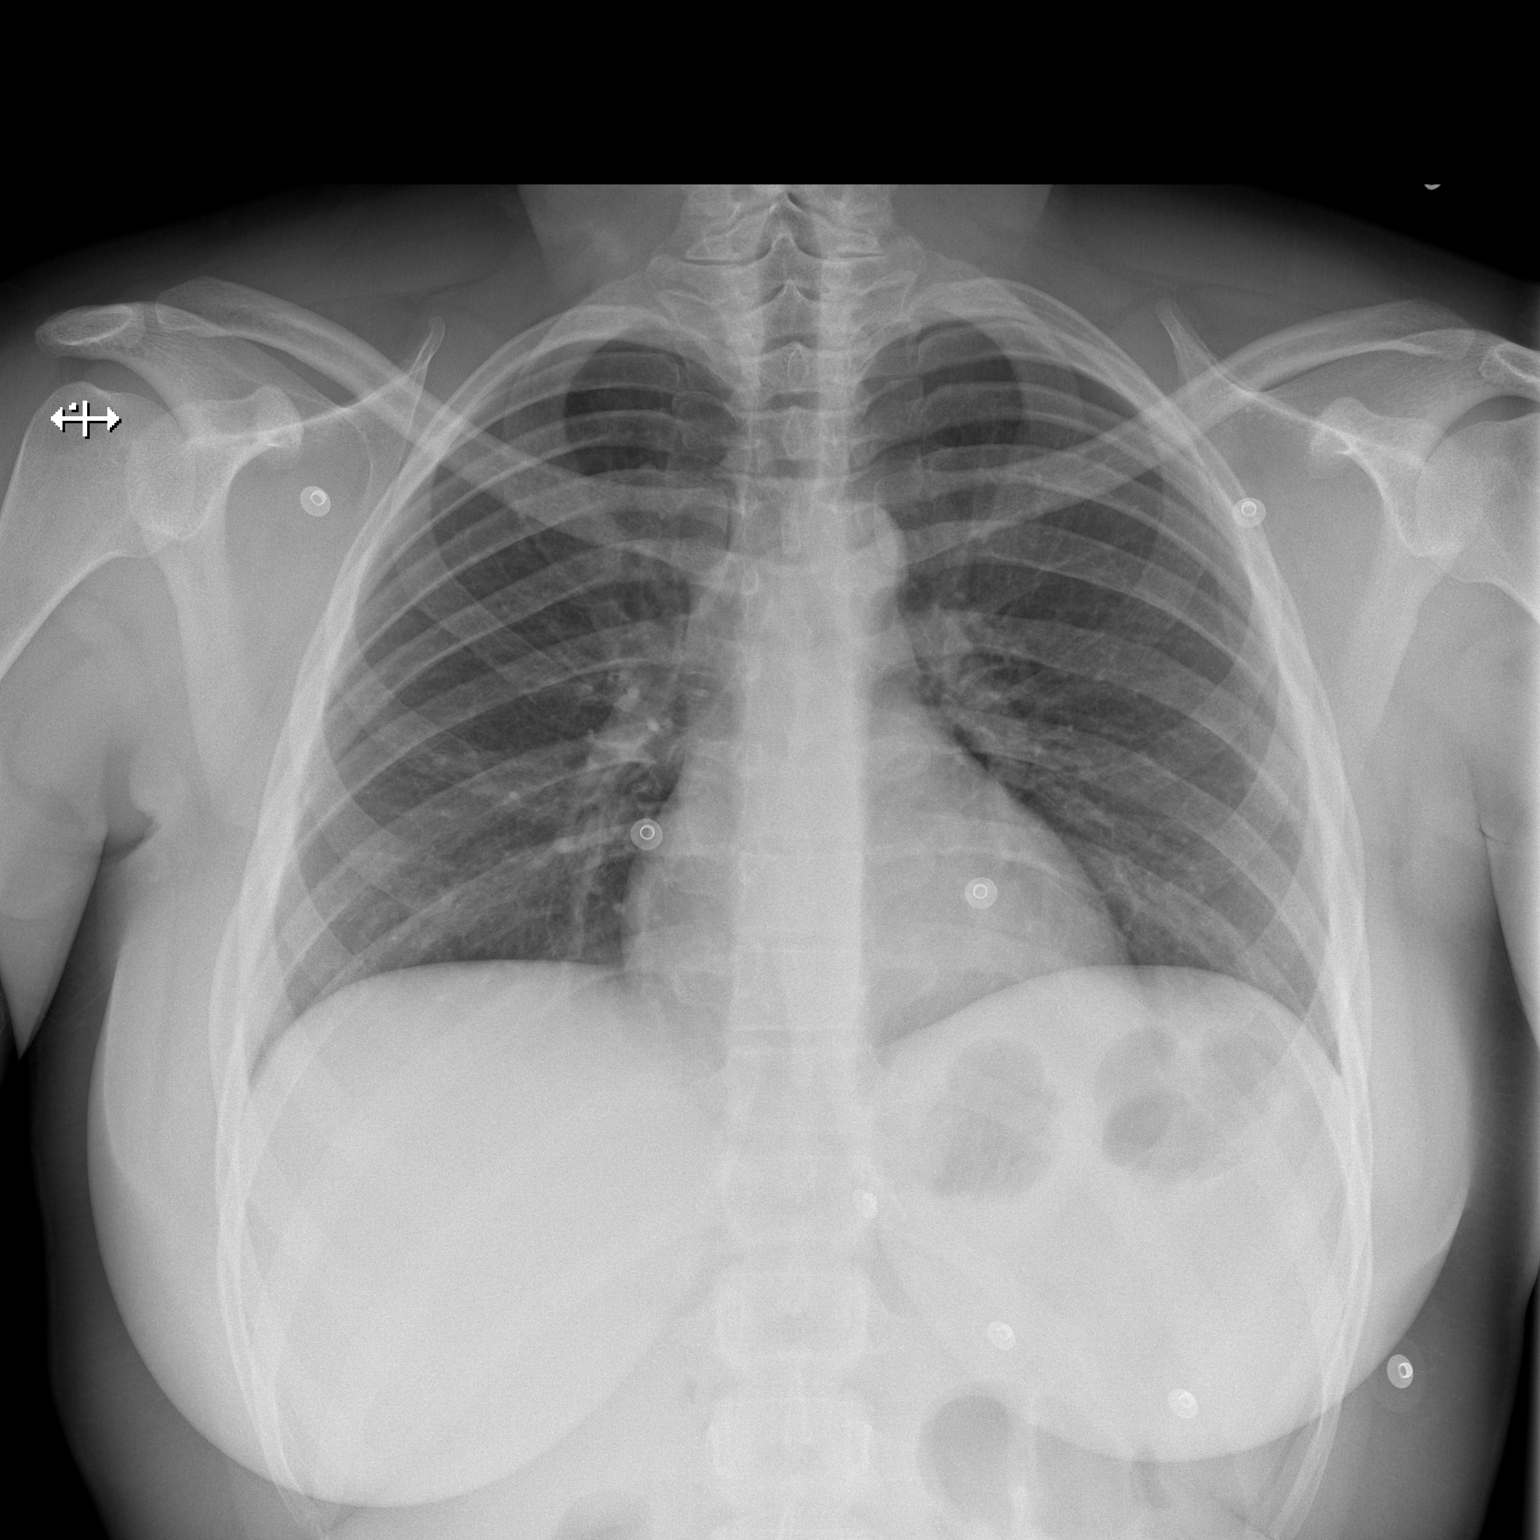

[w chest lat]
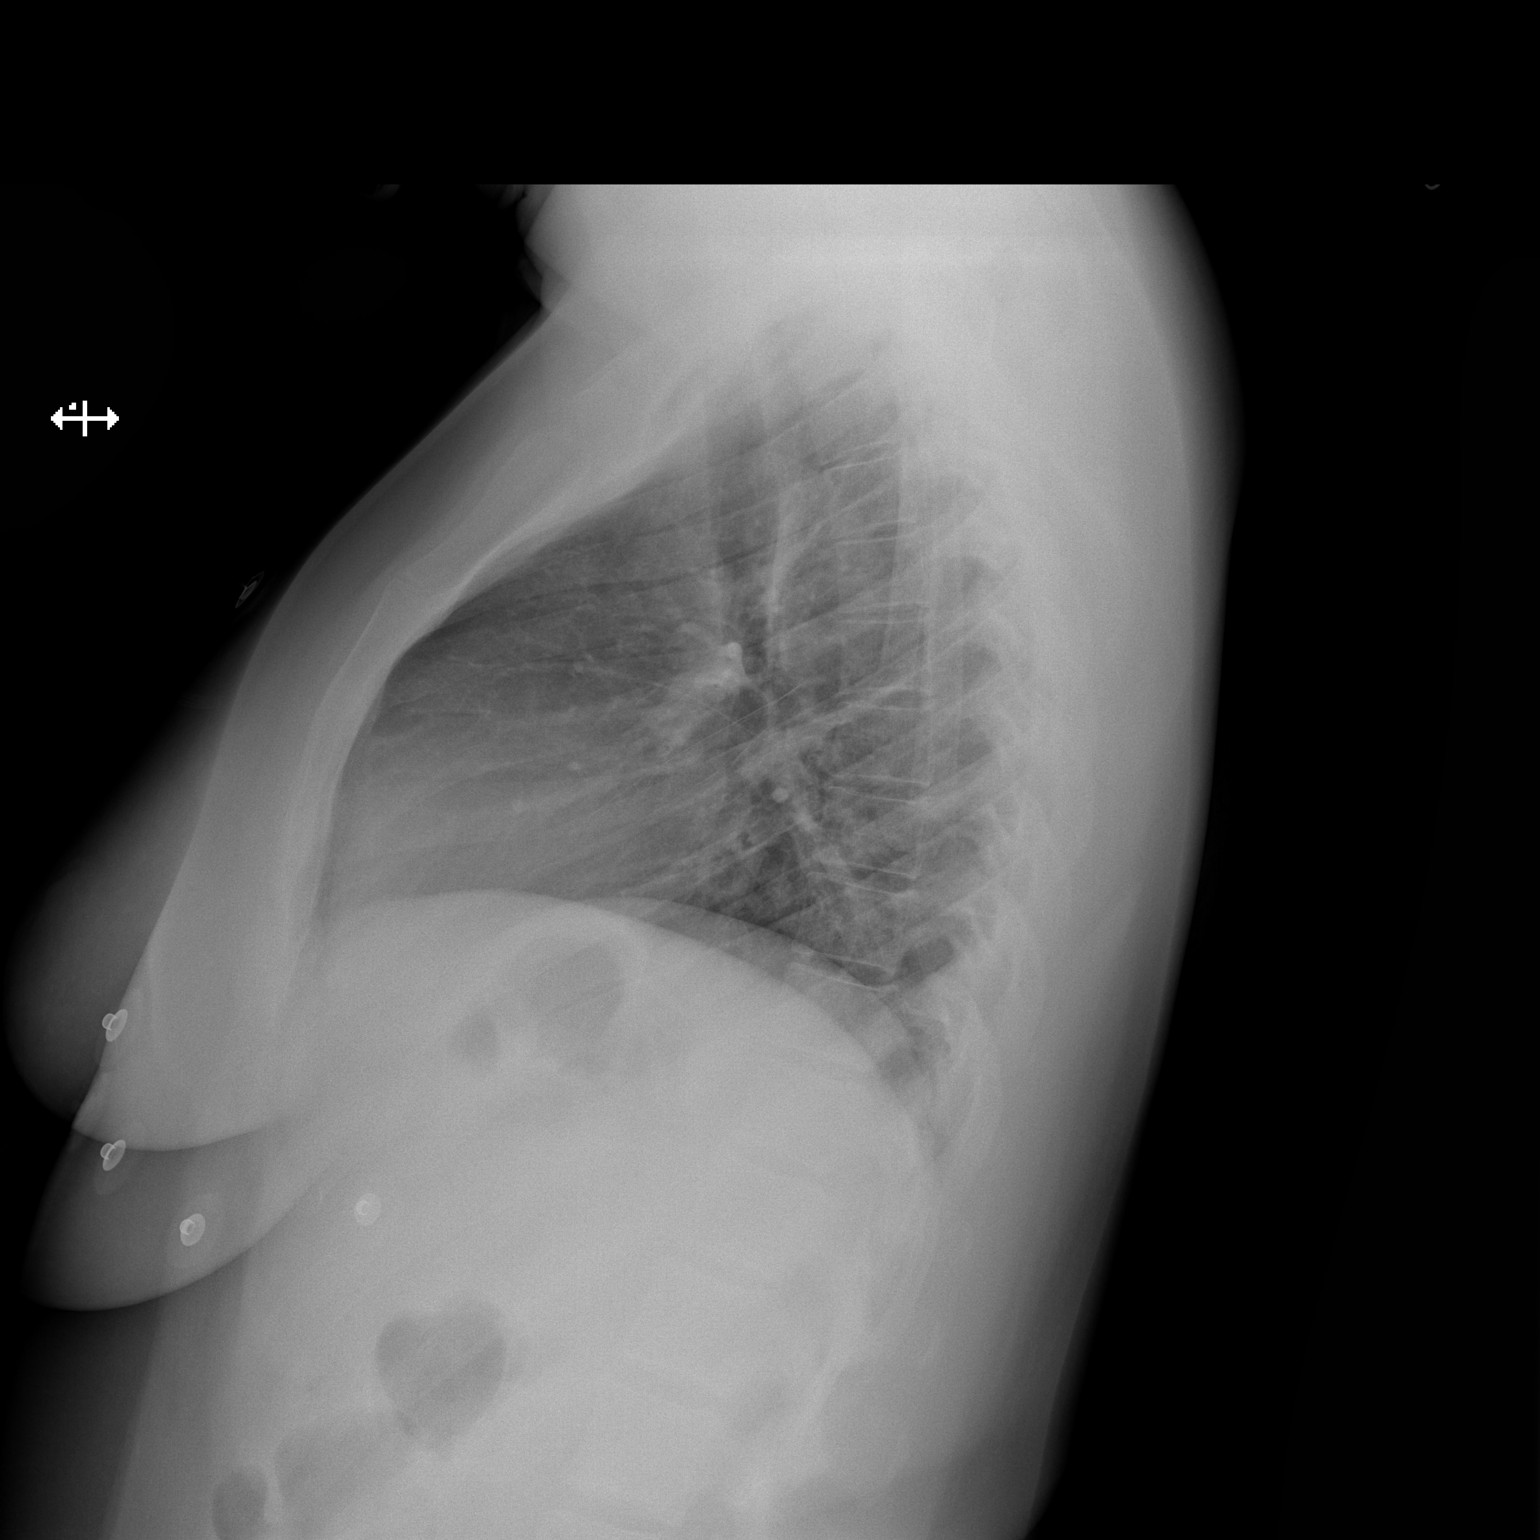

[2 of 2 positions shown; findings below may reference images not displayed]

FINDINGS: The heart size and mediastinal contours are within normal limits.
Both lungs are clear. The visualized skeletal structures are
unremarkable.
IMPRESSION: Normal study.

## 2021-08-02 ENCOUNTER — Other Ambulatory Visit: Payer: Self-pay

## 2021-08-02 ENCOUNTER — Encounter (HOSPITAL_COMMUNITY): Payer: Self-pay

## 2021-08-02 ENCOUNTER — Ambulatory Visit (HOSPITAL_COMMUNITY)
Admission: EM | Admit: 2021-08-02 | Discharge: 2021-08-02 | Disposition: A | Discharge status: Home / Self Care | Payer: Medicaid Other | Attending: Family Medicine | Admitting: Family Medicine

## 2021-08-02 DIAGNOSIS — J029 Acute pharyngitis, unspecified: Secondary | ICD-10-CM | POA: Diagnosis not present

## 2021-08-02 DIAGNOSIS — N898 Other specified noninflammatory disorders of vagina: Secondary | ICD-10-CM | POA: Diagnosis not present

## 2021-08-02 LAB — POCT RAPID STREP A, ED / UC: Streptococcus, Group A Screen (Direct): NEGATIVE

## 2021-08-02 MED ORDER — FLUCONAZOLE 150 MG PO TABS: ORAL_TABLET | ORAL | 0 refills | Status: DC

## 2021-08-02 MED ORDER — TRIAMCINOLONE ACETONIDE 0.1 % EX CREA: 1.0000 "application " | TOPICAL_CREAM | Freq: Two times a day (BID) | CUTANEOUS | 0 refills | Status: DC

## 2021-08-02 NOTE — Discharge Instructions (Addendum)
We have sent testing for sexually transmitted infections. We will notify you of any positive results once they are received. If required, we will prescribe any medications you might need.  You may use over the counter ibuprofen or acetaminophen as needed.  For a sore throat, over the counter products such as [employer] Peroxyl Mouth Sore Rinse or Chloraseptic Sore Throat Spray may provide some temporary relief. Your rapid strep test was negative today. We have sent your throat swab for culture and will let you know of any positive results.

## 2021-08-02 NOTE — ED Provider Notes (Signed)
[facility]   AH:[id number] 08/02/21 Arrival Time: 1007  ASSESSMENT & PLAN:  1. Sore throat   2. Vaginal irritation   3. Vaginal itching    Rapid strep negative. Culture sent. Vaginal cytology pending.  Begin: Meds ordered this encounter  Medications   fluconazole (DIFLUCAN) 150 MG tablet    Sig: Take one tablet by mouth as a single dose. May repeat in 3 days if symptoms persist.    Dispense:  2 tablet    Refill:  0   triamcinolone cream (KENALOG) 0.1 %    Sig: Apply 1 application topically 2 (two) times daily.    Dispense:  15 g    Refill:  0       Discharge Instructions      We have sent testing for sexually transmitted infections. We will notify you of any positive results once they are received. If required, we will prescribe any medications you might need.  You may use over the counter ibuprofen or acetaminophen as needed.  For a sore throat, over the counter products such as [employer] Peroxyl Mouth Sore Rinse or Chloraseptic Sore Throat Spray may provide some temporary relief. Your rapid strep test was negative today. We have sent your throat swab for culture and will let you know of any positive results.    Without s/s of PID.  Labs Reviewed  CULTURE, GROUP A STREP [facility])  POCT RAPID STREP A, ED / UC  CERVICOVAGINAL ANCILLARY ONLY     Will notify of any positive results. Instructed to refrain from sexual activity for at least seven days.  Reviewed expectations re: course of current medical issues. Questions answered. Outlined signs and symptoms indicating need for more acute intervention. Patient verbalized understanding. After Visit Summary given.   SUBJECTIVE:  Beth Gibson is a 35 y.o. female who presents with complaint of vaginal irritation; questions slight discharge. External redness after using [employer] Hair Removal x 2 ago. No tx PTA. Also with ST; few days. Ques subj fever. Tolerating PO intake.  OBJECTIVE:  Vitals:   08/02/21 1053   BP: 124/82  Pulse: 100  Resp: 20  Temp: 98.5 F (36.9 C)  TempSrc: Oral  SpO2: 100%     General appearance: alert, cooperative, appears stated age and no distress Throat: moderate diffuse erythema; some cobblestoning Neck: supple s LAD Lungs: unlabored respirations; speaks full sentences without difficulty Back: no CVA tenderness; FROM at waist Abdomen: soft, non-tender GU: declines Skin: warm and dry Psychological: alert and cooperative; normal mood and affect.  Results for orders placed or performed during the hospital encounter of 08/02/21  POCT Rapid Strep A  Result Value Ref Range   Streptococcus, Group A Screen (Direct) NEGATIVE NEGATIVE    Labs Reviewed  CULTURE, GROUP A STREP [facility])  POCT RAPID STREP A, ED / UC  CERVICOVAGINAL ANCILLARY ONLY    Allergies  Allergen Reactions   Vicodin [Hydrocodone-Acetaminophen] Itching    Pt tolerates percocet    Past Medical History:  Diagnosis Date   Abscess of axilla, right    Abscess of right axilla 06/20/2018   Anemia    Asthma    NO MEDS, SOB with activity   BV (bacterial vaginosis)    Chlamydia 2020   Depression    Gonorrhea 2020   Headache(784.0)    MIGRAINES   History of blood transfusion 2013   with child birth   PONV (postoperative nausea and vomiting)    nausea   Preterm labor  Previous cesarean delivery, antepartum condition or complication [id number]   Classical at 28 weeks, plan repeat at 36 weeks - classical per Gala Romney    Status post elective abortion 12/10/2016   Trichomonas    UTI (lower urinary tract infection)    Yeast vaginitis    Family History  Problem Relation Age of Onset   Hypertension Mother    Diabetes Mellitus I Father    Chronic Renal Failure Father    Social History   Socioeconomic History   Marital status: Single    Spouse name: Not on file   Number of children: Not on file   Years of education: Not on file   Highest education level: Not on file  Occupational  History   Not on file  Tobacco Use   Smoking status: Some Days    Packs/day: 0.25    Years: 0.50    Pack years: 0.13    Types: Cigarettes   Smokeless tobacco: Never  Vaping Use   Vaping Use: Never used  Substance and Sexual Activity   Alcohol use: Yes    Alcohol/week: 1.0 standard drink    Types: 1 Standard drinks or equivalent per week    Comment: occasional   Drug use: Yes    Types: Marijuana    Comment: Last use-08/2019   Sexual activity: Yes    Birth control/protection: None  Other Topics Concern   Not on file  Social History Narrative   Not on file   Social Determinants of Health   Financial Resource Strain: Not on file  Food Insecurity: Not on file  Transportation Needs: Not on file  Physical Activity: Not on file  Stress: Not on file  Social Connections: Not on file  Intimate Partner Violence: Not on file           [place], MD 08/02/21 1209

## 2021-08-02 NOTE — ED Triage Notes (Signed)
Pt presents with difficulty swallowing. States she has swelling in her throat on the left side x 2 days.   States she had an anxiety attack yesterday.  States she has been having vaginal discharge and irritation X 1 week.

## 2021-08-03 LAB — CULTURE, GROUP A STREP (THRC)

## 2021-08-04 LAB — CERVICOVAGINAL ANCILLARY ONLY
Bacterial Vaginitis (gardnerella): POSITIVE — AB
Candida Glabrata: NEGATIVE
Candida Vaginitis: POSITIVE — AB
Chlamydia: NEGATIVE
Comment: NEGATIVE
Comment: NEGATIVE
Comment: NEGATIVE
Comment: NEGATIVE
Comment: NEGATIVE
Comment: NORMAL
Neisseria Gonorrhea: NEGATIVE
Trichomonas: POSITIVE — AB

## 2021-08-07 ENCOUNTER — Telehealth (HOSPITAL_COMMUNITY): Payer: Self-pay | Admitting: Emergency Medicine

## 2021-08-07 MED ORDER — METRONIDAZOLE 500 MG PO TABS
500.0000 mg | ORAL_TABLET | Freq: Two times a day (BID) | ORAL | 0 refills | Status: DC
Start: 2021-08-07 — End: 2022-07-13

## 2021-08-07 MED ORDER — PENICILLIN V POTASSIUM 500 MG PO TABS: 500.0000 mg | ORAL_TABLET | Freq: Two times a day (BID) | ORAL | 0 refills | Status: AC

## 2021-09-25 ENCOUNTER — Other Ambulatory Visit: Payer: Self-pay | Admitting: Student

## 2021-09-25 ENCOUNTER — Other Ambulatory Visit: Payer: Self-pay | Admitting: General Surgery

## 2021-09-25 DIAGNOSIS — N611 Abscess of the breast and nipple: Secondary | ICD-10-CM

## 2021-09-30 ENCOUNTER — Ambulatory Visit
Admission: RE | Admit: 2021-09-30 | Discharge: 2021-09-30 | Disposition: A | Discharge status: Home / Self Care | Payer: Medicaid Other | Source: Ambulatory Visit | Attending: General Surgery | Admitting: General Surgery

## 2021-09-30 ENCOUNTER — Ambulatory Visit: Payer: Medicaid Other

## 2021-09-30 ENCOUNTER — Other Ambulatory Visit: Payer: Self-pay

## 2021-09-30 DIAGNOSIS — N611 Abscess of the breast and nipple: Secondary | ICD-10-CM

## 2022-06-02 ENCOUNTER — Encounter (HOSPITAL_BASED_OUTPATIENT_CLINIC_OR_DEPARTMENT_OTHER): Payer: Self-pay | Admitting: Emergency Medicine

## 2022-06-02 ENCOUNTER — Emergency Department (HOSPITAL_BASED_OUTPATIENT_CLINIC_OR_DEPARTMENT_OTHER)
Admission: EM | Admit: 2022-06-02 | Discharge: 2022-06-02 | Disposition: A | Discharge status: Home / Self Care | Payer: Medicaid Other | Attending: Emergency Medicine | Admitting: Emergency Medicine

## 2022-06-02 ENCOUNTER — Other Ambulatory Visit: Payer: Self-pay

## 2022-06-02 DIAGNOSIS — K6289 Other specified diseases of anus and rectum: Secondary | ICD-10-CM | POA: Diagnosis present

## 2022-06-02 DIAGNOSIS — K649 Unspecified hemorrhoids: Secondary | ICD-10-CM | POA: Diagnosis not present

## 2022-06-02 MED ORDER — HYDROCORTISONE ACETATE 25 MG RE SUPP: 25.0000 mg | Freq: Two times a day (BID) | RECTAL | 0 refills | Status: DC

## 2022-06-02 MED ORDER — OXYCODONE-ACETAMINOPHEN 5-325 MG PO TABS: 1.0000 | ORAL_TABLET | Freq: Four times a day (QID) | ORAL | 0 refills | Status: DC | PRN

## 2022-06-02 MED ORDER — POLYETHYLENE GLYCOL 3350 17 G PO PACK: 17.0000 g | PACK | Freq: Two times a day (BID) | ORAL | 0 refills | Status: DC

## 2022-06-02 NOTE — ED Notes (Signed)
Patient verbalizes understanding of discharge instructions. Opportunity for questioning and answers were provided. Patient discharged from ED.  °

## 2022-06-02 NOTE — Discharge Instructions (Signed)
Avoid narcotic medication as it can cause constipation and worsening symptoms.  Avoid prolonged sitting or straining at the bathroom.  Follow-up with surgical clinic within the week.  Return to the ER if you have fevers or worsening symptoms.

## 2022-06-02 NOTE — ED Triage Notes (Signed)
Rectal pain started last night. Denies bleeding or any issues going to bathroom. No anal activity recently. Painful to sit. Took OTC pain medication around 1pm with some relief

## 2022-06-02 NOTE — ED Notes (Signed)
At bedside w/ Dr. Almyra Free as a chaperone for exam.

## 2022-06-02 NOTE — ED Provider Notes (Signed)
[facility] EMERGENCY DEPT Provider Note   CSN: [id number] Arrival date & time: 06/02/22  1615     History  Chief Complaint  Patient presents with   Rectal Pain    Beth Gibson is a 36 y.o. female.  Patient presents chief complaint of rectal pain ongoing for the past 2 to 3 days.  Symptoms grew worse last night.  She states that she is felt a bump on her rectum since then.  Denies fevers or cough denies vomiting or diarrhea.       Home Medications Prior to Admission medications   Medication Sig Start Date End Date Taking? Authorizing Provider  hydrocortisone (ANUSOL-HC) 25 MG suppository Place 1 suppository (25 mg total) rectally 2 (two) times daily. 06/02/22  Yes Kalyssa Anker, Greggory Brandy, MD  oxyCODONE-acetaminophen (PERCOCET/ROXICET) 5-325 MG tablet Take 1 tablet by mouth every 6 (six) hours as needed for up to 3 doses for severe pain. 06/02/22  Yes Jaxtyn Linville, Greggory Brandy, MD  polyethylene glycol (MIRALAX / GLYCOLAX) 17 g packet Take 17 g by mouth 2 (two) times daily. 06/02/22  Yes Luna Fuse, MD  desogestrel-ethinyl estradiol (APRI) 0.15-30 MG-MCG tablet Take 1 tablet by mouth daily. Patient not taking: No sig reported 03/25/20   Chancy Milroy, MD  fluconazole (DIFLUCAN) 150 MG tablet Take one tablet by mouth as a single dose. May repeat in 3 days if symptoms persist. 08/02/21   Vanessa Kick, MD  ibuprofen (ADVIL) 600 MG tablet Take 1 tablet (600 mg total) by mouth every 6 (six) hours as needed. Patient taking differently: Take 600 mg by mouth every 6 (six) hours as needed for mild pain. 06/17/20   Woodroe Mode, MD  metroNIDAZOLE (FLAGYL) 500 MG tablet Take 1 tablet (500 mg total) by mouth 2 (two) times daily. 08/07/21   LampteyMyrene Galas, MD  triamcinolone cream (KENALOG) 0.1 % Apply 1 application topically 2 (two) times daily. 08/02/21   Vanessa Kick, MD      Allergies    Vicodin [hydrocodone-acetaminophen]    Review of Systems   Review of Systems  Constitutional:   Negative for fever.  HENT:  Negative for ear pain.   Eyes:  Negative for pain.  Respiratory:  Negative for cough.   Cardiovascular:  Negative for chest pain.  Gastrointestinal:  Negative for abdominal pain.  Genitourinary:  Negative for flank pain.  Musculoskeletal:  Negative for back pain.  Skin:  Negative for rash.  Neurological:  Negative for headaches.    Physical Exam Updated Vital Signs BP (!) 143/94 (BP Location: Right Arm)   Pulse (!) 102   Temp 98.3 F (36.8 C)   Resp 16   Ht '5\' 8"'$  (1.727 m)   Wt 91.2 kg   SpO2 100%   BMI 30.56 kg/m  Physical Exam Constitutional:      General: She is not in acute distress.    Appearance: Normal appearance.  HENT:     Head: Normocephalic.     Nose: Nose normal.  Eyes:     Extraocular Movements: Extraocular movements intact.  Cardiovascular:     Rate and Rhythm: Normal rate.  Pulmonary:     Effort: Pulmonary effort is normal.  Genitourinary:    Comments: Exam done with nursing chaperone present.  Rectal hemorrhoid noted.  Appears soft with moderate tenderness on exam. Musculoskeletal:        General: Normal range of motion.     Cervical back: Normal range of motion.  Neurological:  General: No focal deficit present.     Mental Status: She is alert. Mental status is at baseline.     ED Results / Procedures / Treatments   Labs (all labs ordered are listed, but only abnormal results are displayed) Labs Reviewed - No data to display  EKG None  Radiology No results found.  Procedures Procedures    Medications Ordered in ED Medications - No data to display  ED Course/ Medical Decision Making/ A&P                           Medical Decision Making  Review of records shows office visit April 13, 2022 for abscess of the axilla.  Cardio monitoring showing sinus rhythm.  No additional diagnostics were done clinically suspect rectal hemorrhoid.  Recommending Anusol and stool softeners.  Patient requesting  narcotic medication.  Risk of increased constipation worsening symptoms discussed.  A few tablets provided.  Recommending outpatient follow-up with surgical clinic this week, advised return for fevers worsening symptoms or any additional concerns.        Final Clinical Impression(s) / ED Diagnoses Final diagnoses:  Hemorrhoids, unspecified hemorrhoid type    Rx / DC Orders ED Discharge Orders          Ordered    hydrocortisone (ANUSOL-HC) 25 MG suppository  2 times daily        06/02/22 1725    polyethylene glycol (MIRALAX / GLYCOLAX) 17 g packet  2 times daily        06/02/22 1725    oxyCODONE-acetaminophen (PERCOCET/ROXICET) 5-325 MG tablet  Every 6 hours PRN        06/02/22 1725              Luna Fuse, MD 06/02/22 1725

## 2022-07-12 ENCOUNTER — Ambulatory Visit (HOSPITAL_COMMUNITY)
Admission: EM | Admit: 2022-07-12 | Discharge: 2022-07-12 | Disposition: A | Discharge status: Home / Self Care | Payer: Medicaid Other | Attending: Physician Assistant | Admitting: Physician Assistant

## 2022-07-12 DIAGNOSIS — N898 Other specified noninflammatory disorders of vagina: Secondary | ICD-10-CM | POA: Insufficient documentation

## 2022-07-12 LAB — HIV ANTIBODY (ROUTINE TESTING W REFLEX): HIV Screen 4th Generation wRfx: NONREACTIVE

## 2022-07-12 MED ORDER — METRONIDAZOLE 0.75 % VA GEL: 1.0000 | Freq: Every day | VAGINAL | 0 refills | Status: DC

## 2022-07-12 NOTE — ED Provider Notes (Signed)
[facility]    CSN: [id number] Arrival date & time: 07/12/22  1036      History   Chief Complaint Chief Complaint  Patient presents with   Vaginal Itching    HPI Beth Gibson is a 36 y.o. female.   Patient presents with vaginal itching, irritation and Beth Gibson vaginal discharge with a slight fishy odor beginning 1 week ago after cessation of menstrual period.  Has not attempted treatment of symptoms.  Sexually active but no concern for STI infections.  No known exposure.  Denies urinary symptoms, new rash or lesions.  r,   Past Medical History:  Diagnosis Date   Abscess of axilla, right    Abscess of right axilla 06/20/2018   Anemia    Asthma    NO MEDS, SOB with activity   BV (bacterial vaginosis)    Chlamydia 2020   Depression    Gonorrhea 2020   Headache(784.0)    MIGRAINES   History of blood transfusion 2013   with child birth   PONV (postoperative nausea and vomiting)    nausea   Preterm labor    Previous cesarean delivery, antepartum condition or complication 28/31/5176   Classical at 28 weeks, plan repeat at 53 weeks - classical per [facility]    Status post elective abortion 12/10/2016   Trichomonas    UTI (lower urinary tract infection)    Yeast vaginitis     Patient Active Problem List   Diagnosis Date Noted   Axillary abscess 04/03/2021   Chronic cystitis 05/06/2020   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   DUB (dysfunctional uterine bleeding) 03/25/2020   Severe episode of recurrent major depressive disorder, with psychotic features ([place])    MDD (major depressive disorder) 03/16/2016   Anemia due to chronic blood loss 11/01/2014   Obesity (BMI 30-39.9) 12/10/2011    Past Surgical History:  Procedure Laterality Date   CESAREAN SECTION     CESAREAN SECTION  04/18/2012   Procedure: CESAREAN SECTION;  Surgeon: Guss Bunde, MD;  Location: [facility];  Service: Gynecology;  Laterality: N/A;  repeat   cyst removed from L buttock      CYSTECTOMY  2009   left buttock   DILATION AND EVACUATION N/A 02/24/2017   Procedure: DILATATION AND EVACUATION;  Surgeon: Lavonia Drafts, MD;  Location: [facility];  Service: Gynecology;  Laterality: N/A;   HYDRADENITIS EXCISION Right 11/04/2018   Procedure: EXCISION HIDRADENITIS RIGHT  AXILLA;  Surgeon: Jovita Kussmaul, MD;  Location: [facility];  Service: General;  Laterality: Right;   INCISION AND DRAINAGE ABSCESS Right 11/01/2014   Procedure: INCISION AND DRAINAGE RIGHT AXILLARY ABSCESS;  Surgeon: Leighton Ruff, MD;  Location: [facility];  Service: General;  Laterality: Right;   INCISION AND DRAINAGE ABSCESS Right 06/21/2018   Procedure: INCISION AND DRAINAGE RIGHT ABSCESS AXILLA;  Surgeon: Jovita Kussmaul, MD;  Location: [facility];  Service: General;  Laterality: Right;   INCISION AND DRAINAGE ABSCESS Right 04/03/2021   Procedure: INCISION AND DRAINAGE RIGHT AXILLA ABSCESS;  Surgeon: Donnie Mesa, MD;  Location: [facility];  Service: General;  Laterality: Right;   IRRIGATION AND DEBRIDEMENT ABSCESS Right 08/09/2015   Procedure: IRRIGATION AND DEBRIDEMENT AXILLARY ABSCESS;  Surgeon: Alphonsa Overall, MD;  Location: [facility];  Service: General;  Laterality: Right;   [place] EXTRACTION  2009    OB History     Gravida  5   Para  2  Term  0   Preterm  2   AB  3   Living  2      SAB  1   IAB  0   Ectopic  0   Multiple  0   Live Births  2            Home Medications    Prior to Admission medications   Medication Sig Start Date End Date Taking? Authorizing Provider  desogestrel-ethinyl estradiol (APRI) 0.15-30 MG-MCG tablet Take 1 tablet by mouth daily. Patient not taking: No sig reported 03/25/20   Chancy Milroy, MD  fluconazole (DIFLUCAN) 150 MG tablet Take one tablet by mouth as a single dose. May repeat in 3 days if symptoms persist. 08/02/21   Vanessa Kick, MD  hydrocortisone (ANUSOL-HC) 25 MG suppository Place 1 suppository  (25 mg total) rectally 2 (two) times daily. 06/02/22   Luna Fuse, MD  ibuprofen (ADVIL) 600 MG tablet Take 1 tablet (600 mg total) by mouth every 6 (six) hours as needed. Patient taking differently: Take 600 mg by mouth every 6 (six) hours as needed for mild pain. 06/17/20   Woodroe Mode, MD  metroNIDAZOLE (FLAGYL) 500 MG tablet Take 1 tablet (500 mg total) by mouth 2 (two) times daily. 08/07/21   Lamptey, Myrene Galas, MD  oxyCODONE-acetaminophen (PERCOCET/ROXICET) 5-325 MG tablet Take 1 tablet by mouth every 6 (six) hours as needed for up to 3 doses for severe pain. 06/02/22   Luna Fuse, MD  polyethylene glycol (MIRALAX / GLYCOLAX) 17 g packet Take 17 g by mouth 2 (two) times daily. 06/02/22   Luna Fuse, MD  triamcinolone cream (KENALOG) 0.1 % Apply 1 application topically 2 (two) times daily. 08/02/21   Vanessa Kick, MD    Family History Family History  Problem Relation Age of Onset   Hypertension Mother    Diabetes Mellitus I Father    Chronic Renal Failure Father     Social History Social History   Tobacco Use   Smoking status: Some Days    Packs/day: 0.25    Years: 0.50    Total pack years: 0.13    Types: Cigarettes   Smokeless tobacco: Never  Vaping Use   Vaping Use: Never used  Substance Use Topics   Alcohol use: Yes    Alcohol/week: 1.0 standard drink of alcohol    Types: 1 Standard drinks or equivalent per week    Comment: occasional   Drug use: Yes    Types: Marijuana    Comment: Last use-08/2019     Allergies   Vicodin [hydrocodone-acetaminophen]   Review of Systems Review of Systems  Constitutional: Negative.   Respiratory: Negative.    Genitourinary:  Positive for vaginal discharge and vaginal pain. Negative for decreased urine volume, difficulty urinating, dyspareunia, dysuria, enuresis, flank pain, frequency, genital sores, hematuria, menstrual problem, pelvic pain, urgency and vaginal bleeding.  Skin: Negative.   Neurological: Negative.       Physical Exam Triage Vital Signs ED Triage Vitals  Enc Vitals Group     BP 07/12/22 1056 (!) 158/103     Pulse Rate 07/12/22 1056 89     Resp 07/12/22 1056 18     Temp 07/12/22 1056 98.3 F (36.8 C)     Temp Source 07/12/22 1056 Oral     SpO2 07/12/22 1056 99 %     Weight --      Height --      Head Circumference --  Peak Flow --      Pain Score 07/12/22 1055 0     Pain Loc --      Pain Edu? --      Excl. in [place]? --    No data found.  Updated Vital Signs BP (!) 158/103   Pulse 89   Temp 98.3 F (36.8 C) (Oral)   Resp 18   LMP 07/07/2022 (Approximate)   SpO2 99%   Visual Acuity Right Eye Distance:   Left Eye Distance:   Bilateral Distance:    Right Eye Near:   Left Eye Near:    Bilateral Near:     Physical Exam Constitutional:      Appearance: Normal appearance.  Eyes:     Extraocular Movements: Extraocular movements intact.  Pulmonary:     Effort: Pulmonary effort is normal.  Genitourinary:    Comments: Deferred  Neurological:     Mental Status: She is alert and oriented to person, place, and time. Mental status is at baseline.  Psychiatric:        Mood and Affect: Mood normal.        Behavior: Behavior normal.      UC Treatments / Results  Labs (all labs ordered are listed, but only abnormal results are displayed) Labs Reviewed - No data to display  EKG   Radiology No results found.  Procedures Procedures (including critical care time)  Medications Ordered in UC Medications - No data to display  Initial Impression / Assessment and Plan / UC Course  I have reviewed the triage vital signs and the nursing notes.  Pertinent labs & imaging results that were available during my care of the patient were reviewed by me and considered in my medical decision making (see chart for details).  Vaginal discharge  Symptomology is consistent with bacterial vaginosis, discussed with patient, will treat prophylactically, MetroGel sent to  pharmacy and discussed administration, STI labs are pending, will treat further per protocol, advised abstinence until lab results, treatment is complete and all symptoms have resolved, advised condom use during all sexual encounters moving forward, may follow-up with his urgent care as needed Final Clinical Impressions(s) / UC Diagnoses   Final diagnoses:  None   Discharge Instructions   None    ED Prescriptions   None    PDMP not reviewed this encounter.   Hans Eden, NP 07/12/22 1115

## 2022-07-12 NOTE — Discharge Instructions (Signed)
Today you are being treated prophylactically for  Bacterial vaginosis   Use MetroGel suppository every evening before bed for the next 7 days  Bacterial vaginosis which results from an overgrowth of one on several organisms that are normally present in your vagina. Vaginosis is an inflammation of the vagina that can result in discharge, itching and pain.  Labs pending 2-3 days, you will be contacted if positive for any sti and treatment will be sent to the pharmacy, you will have to return to the clinic if positive for gonorrhea to receive treatment   Please refrain from having sex until labs results, if positive please refrain from having sex until treatment complete and symptoms resolve   If positive for HIV, Syphilis, Chlamydia  gonorrhea or trichomoniasis please notify partner or partners so they may tested as well  Moving forward, it is recommended you use some form of protection against the transmission of sti infections  such as condoms or dental dams with each sexual encounter     In addition: Avoid baths, hot tubs and whirlpool spas.  Don't use scented or harsh soaps Avoid irritants. These include scented tampons and pads. Wipe from front to back after using the toilet. Don't douche. Your vagina doesn't require cleansing other than normal bathing.  Use a condom.  Wear cotton underwear, this fabric absorbs some moisture.

## 2022-07-12 NOTE — ED Triage Notes (Signed)
Pt presents to uc with co of vaginal itching and irritation for 1 week. Pt reports recently came off cycle and is concerned for yeast or BV

## 2022-07-13 ENCOUNTER — Telehealth (HOSPITAL_COMMUNITY): Payer: Self-pay | Admitting: Emergency Medicine

## 2022-07-13 LAB — CERVICOVAGINAL ANCILLARY ONLY
Bacterial Vaginitis (gardnerella): POSITIVE — AB
Candida Glabrata: NEGATIVE
Candida Vaginitis: POSITIVE — AB
Chlamydia: NEGATIVE
Comment: NEGATIVE
Comment: NEGATIVE
Comment: NEGATIVE
Comment: NEGATIVE
Comment: NEGATIVE
Comment: NORMAL
Neisseria Gonorrhea: NEGATIVE
Trichomonas: POSITIVE — AB

## 2022-07-13 LAB — RPR: RPR Ser Ql: NONREACTIVE

## 2022-07-13 MED ORDER — FLUCONAZOLE 150 MG PO TABS: 150.0000 mg | ORAL_TABLET | Freq: Once | ORAL | 0 refills | Status: AC

## 2022-07-13 MED ORDER — METRONIDAZOLE 500 MG PO TABS: 500.0000 mg | ORAL_TABLET | Freq: Two times a day (BID) | ORAL | 0 refills | Status: DC

## 2022-12-04 ENCOUNTER — Emergency Department (HOSPITAL_COMMUNITY)
Admission: EM | Admit: 2022-12-04 | Discharge: 2022-12-04 | Disposition: A | Discharge status: Home / Self Care | Payer: No Typology Code available for payment source | Attending: Emergency Medicine | Admitting: Emergency Medicine

## 2022-12-04 ENCOUNTER — Other Ambulatory Visit: Payer: Self-pay

## 2022-12-04 ENCOUNTER — Emergency Department (HOSPITAL_COMMUNITY): Payer: No Typology Code available for payment source

## 2022-12-04 DIAGNOSIS — M549 Dorsalgia, unspecified: Secondary | ICD-10-CM | POA: Diagnosis not present

## 2022-12-04 DIAGNOSIS — Y92410 Unspecified street and highway as the place of occurrence of the external cause: Secondary | ICD-10-CM | POA: Diagnosis not present

## 2022-12-04 DIAGNOSIS — D649 Anemia, unspecified: Secondary | ICD-10-CM | POA: Insufficient documentation

## 2022-12-04 DIAGNOSIS — R0789 Other chest pain: Secondary | ICD-10-CM | POA: Diagnosis not present

## 2022-12-04 DIAGNOSIS — R109 Unspecified abdominal pain: Secondary | ICD-10-CM | POA: Diagnosis not present

## 2022-12-04 LAB — CBC
HCT: 30.3 % — ABNORMAL LOW (ref 36.0–46.0)
Hemoglobin: 8.7 g/dL — ABNORMAL LOW (ref 12.0–15.0)
MCH: 16.9 pg — ABNORMAL LOW (ref 26.0–34.0)
MCHC: 28.7 g/dL — ABNORMAL LOW (ref 30.0–36.0)
MCV: 58.9 fL — ABNORMAL LOW (ref 80.0–100.0)
Platelets: 213 10*3/uL (ref 150–400)
RBC: 5.14 MIL/uL — ABNORMAL HIGH (ref 3.87–5.11)
RDW: 21.9 % — ABNORMAL HIGH (ref 11.5–15.5)
WBC: 6.9 10*3/uL (ref 4.0–10.5)
nRBC: 0 % (ref 0.0–0.2)

## 2022-12-04 LAB — I-STAT CHEM 8, ED
BUN: 6 mg/dL (ref 6–20)
Calcium, Ion: 1.1 mmol/L — ABNORMAL LOW (ref 1.15–1.40)
Chloride: 104 mmol/L (ref 98–111)
Creatinine, Ser: 0.6 mg/dL (ref 0.44–1.00)
Glucose, Bld: 94 mg/dL (ref 70–99)
HCT: 32 % — ABNORMAL LOW (ref 36.0–46.0)
Hemoglobin: 10.9 g/dL — ABNORMAL LOW (ref 12.0–15.0)
Potassium: 3.3 mmol/L — ABNORMAL LOW (ref 3.5–5.1)
Sodium: 137 mmol/L (ref 135–145)
TCO2: 17 mmol/L — ABNORMAL LOW (ref 22–32)

## 2022-12-04 LAB — COMPREHENSIVE METABOLIC PANEL
ALT: 11 U/L (ref 0–44)
AST: 24 U/L (ref 15–41)
Albumin: 4.1 g/dL (ref 3.5–5.0)
Alkaline Phosphatase: 37 U/L — ABNORMAL LOW (ref 38–126)
Anion gap: 11 (ref 5–15)
BUN: 7 mg/dL (ref 6–20)
CO2: 18 mmol/L — ABNORMAL LOW (ref 22–32)
Calcium: 9.3 mg/dL (ref 8.9–10.3)
Chloride: 105 mmol/L (ref 98–111)
Creatinine, Ser: 0.85 mg/dL (ref 0.44–1.00)
GFR, Estimated: >60 mL/min (ref >60)
Glucose, Bld: 93 mg/dL (ref 70–99)
Potassium: 3.2 mmol/L — ABNORMAL LOW (ref 3.5–5.1)
Sodium: 134 mmol/L — ABNORMAL LOW (ref 135–145)
Total Bilirubin: 0.2 mg/dL — ABNORMAL LOW (ref 0.3–1.2)
Total Protein: 8.1 g/dL (ref 6.5–8.1)

## 2022-12-04 LAB — ETHANOL: Alcohol, Ethyl (B): <10 mg/dL (ref <10)

## 2022-12-04 LAB — TYPE AND SCREEN
ABO/RH(D): B POS
Antibody Screen: NEGATIVE

## 2022-12-04 LAB — PROTIME-INR
INR: 1.1 (ref 0.8–1.2)
Prothrombin Time: 13.8 seconds (ref 11.4–15.2)

## 2022-12-04 LAB — I-STAT BETA HCG BLOOD, ED (MC, WL, AP ONLY): I-stat hCG, quantitative: <5 m[IU]/mL (ref <5)

## 2022-12-04 LAB — LACTIC ACID, PLASMA: Lactic Acid, Venous: 3.5 mmol/L (ref 0.5–1.9)

## 2022-12-04 MED ORDER — FENTANYL CITRATE PF 50 MCG/ML IJ SOSY
PREFILLED_SYRINGE | INTRAMUSCULAR | Status: AC
  Filled 2022-12-04: qty 1

## 2022-12-04 MED ORDER — FENTANYL CITRATE PF 50 MCG/ML IJ SOSY
50.0000 ug | PREFILLED_SYRINGE | Freq: Once | INTRAMUSCULAR | Status: AC
  Administered 2022-12-04: 50 ug via INTRAVENOUS
  Filled 2022-12-04: qty 1

## 2022-12-04 MED ORDER — ACETAMINOPHEN 500 MG PO TABS
1000.0000 mg | ORAL_TABLET | Freq: Once | ORAL | Status: AC
  Administered 2022-12-04: 1000 mg via ORAL
  Filled 2022-12-04: qty 2

## 2022-12-04 MED ORDER — LACTATED RINGERS IV BOLUS
1000.0000 mL | Freq: Once | INTRAVENOUS | Status: AC
  Administered 2022-12-04: 1000 mL via INTRAVENOUS

## 2022-12-04 MED ORDER — IOHEXOL 350 MG/ML SOLN
75.0000 mL | Freq: Once | INTRAVENOUS | Status: AC | PRN
  Administered 2022-12-04: 75 mL via INTRAVENOUS
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
## 2022-12-04 NOTE — ED Notes (Signed)
Patient transported to CT with [place] RN
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
## 2022-12-04 NOTE — ED Notes (Signed)
Fast exam negative
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
## 2023-02-15 ENCOUNTER — Other Ambulatory Visit: Payer: Self-pay | Admitting: Student

## 2023-02-15 DIAGNOSIS — N611 Abscess of the breast and nipple: Secondary | ICD-10-CM

## 2023-02-15 NOTE — Progress Notes (Signed)
Orders placed for bilateral mammogram and right breast US to evaluate for breast abscess.  Aspirate if abscess is present.  Placed on Augmentin.

## 2023-02-16 ENCOUNTER — Other Ambulatory Visit: Payer: Self-pay | Admitting: Student

## 2023-02-16 DIAGNOSIS — N611 Abscess of the breast and nipple: Secondary | ICD-10-CM

## 2023-02-17 ENCOUNTER — Ambulatory Visit
Admission: RE | Admit: 2023-02-17 | Discharge: 2023-02-17 | Disposition: A | Discharge status: Home / Self Care | Payer: Medicaid Other | Source: Ambulatory Visit | Attending: Student | Admitting: Student

## 2023-02-17 ENCOUNTER — Ambulatory Visit: Payer: Medicaid Other

## 2023-02-17 DIAGNOSIS — N611 Abscess of the breast and nipple: Secondary | ICD-10-CM

## 2023-02-25 ENCOUNTER — Ambulatory Visit (HOSPITAL_COMMUNITY)
Admission: EM | Admit: 2023-02-25 | Discharge: 2023-02-25 | Disposition: A | Discharge status: Home / Self Care | Payer: Medicaid Other | Attending: Sports Medicine | Admitting: Sports Medicine

## 2023-02-25 ENCOUNTER — Encounter (HOSPITAL_COMMUNITY): Payer: Self-pay

## 2023-02-25 DIAGNOSIS — N898 Other specified noninflammatory disorders of vagina: Secondary | ICD-10-CM | POA: Diagnosis present

## 2023-02-25 MED ORDER — LIDOCAINE HCL URETHRAL/MUCOSAL 2 % EX GEL: 1.0000 | CUTANEOUS | 0 refills | Status: DC | PRN

## 2023-02-25 NOTE — ED Provider Notes (Signed)
[facility]    CSN: [id number] Arrival date & time: 02/25/23  0907      History   Chief Complaint Chief Complaint  Patient presents with   Vaginal Itching    HPI Beth Gibson is a 37 y.o. female.   She is here today with chief complaint of vaginal irritation and itching since Saturday.  She reports she has had a slight amount of discharge clear discharge that was nonodorous and started her period today.  She has some abdominal pain which she attributes to her menstrual cramps as this is normal for her.  She does have irregular periods with heavy bleeding.  She follows with GYN for this.  She reports she has had yeast infections and BV before and wants to be checked to make sure that is neither of those.  She is requesting some medication to relieve some of her discomfort.  She denies any dysuria, fevers, or chills.    Vaginal Itching    Past Medical History:  Diagnosis Date   Abscess of axilla, right    Abscess of right axilla 06/20/2018   Anemia    Asthma    NO MEDS, SOB with activity   BV (bacterial vaginosis)    Chlamydia 2020   Depression    Gonorrhea 2020   Headache(784.0)    MIGRAINES   History of blood transfusion 2013   with child birth   PONV (postoperative nausea and vomiting)    nausea   Preterm labor    Previous cesarean delivery, antepartum condition or complication [id number]   Classical at 28 weeks, plan repeat at 34 weeks - classical per [facility]    Status post elective abortion 12/10/2016   Trichomonas    UTI (lower urinary tract infection)    Yeast vaginitis     Patient Active Problem List   Diagnosis Date Noted   Axillary abscess 04/03/2021   Chronic cystitis 05/06/2020   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   DUB (dysfunctional uterine bleeding) 03/25/2020   Severe episode of recurrent major depressive disorder, with psychotic features ([place])    MDD (major depressive disorder) 03/16/2016   Anemia due to chronic blood loss  11/01/2014   Obesity (BMI 30-39.9) 12/10/2011    Past Surgical History:  Procedure Laterality Date   CESAREAN SECTION     CESAREAN SECTION  04/18/2012   Procedure: CESAREAN SECTION;  Surgeon: Guss Bunde, MD;  Location: [facility];  Service: Gynecology;  Laterality: N/A;  repeat   cyst removed from L buttock     CYSTECTOMY  2009   left buttock   DILATION AND EVACUATION N/A 02/24/2017   Procedure: DILATATION AND EVACUATION;  Surgeon: Lavonia Drafts, MD;  Location: [facility];  Service: Gynecology;  Laterality: N/A;   HYDRADENITIS EXCISION Right 11/04/2018   Procedure: EXCISION HIDRADENITIS RIGHT  AXILLA;  Surgeon: Jovita Kussmaul, MD;  Location: [facility];  Service: General;  Laterality: Right;   INCISION AND DRAINAGE ABSCESS Right 11/01/2014   Procedure: INCISION AND DRAINAGE RIGHT AXILLARY ABSCESS;  Surgeon: Leighton Ruff, MD;  Location: [facility];  Service: General;  Laterality: Right;   INCISION AND DRAINAGE ABSCESS Right 06/21/2018   Procedure: INCISION AND DRAINAGE RIGHT ABSCESS AXILLA;  Surgeon: Jovita Kussmaul, MD;  Location: [facility];  Service: General;  Laterality: Right;   INCISION AND DRAINAGE ABSCESS Right 04/03/2021   Procedure: INCISION AND DRAINAGE RIGHT AXILLA ABSCESS;  Surgeon: Donnie Mesa, MD;  Location: [place];  Service:  General;  Laterality: Right;   IRRIGATION AND DEBRIDEMENT ABSCESS Right 08/09/2015   Procedure: IRRIGATION AND DEBRIDEMENT AXILLARY ABSCESS;  Surgeon: Alphonsa Overall, MD;  Location: [facility];  Service: General;  Laterality: Right;   WISDOM TOOTH EXTRACTION     WISDOM TOOTH EXTRACTION  2009    OB History     Gravida  5   Para  2   Term  0   Preterm  2   AB  3   Living  2      SAB  1   IAB  0   Ectopic  0   Multiple  0   Live Births  2            Home Medications    Prior to Admission medications   Medication Sig Start Date End Date Taking? Authorizing Provider  lidocaine (XYLOCAINE) 2 % jelly Apply 1 Application  topically as needed (up to 4 times a day). 02/25/23  Yes Rodena Goldmann A, DO  ibuprofen (ADVIL) 600 MG tablet Take 1 tablet (600 mg total) by mouth every 6 (six) hours as needed. Patient taking differently: Take 600 mg by mouth every 6 (six) hours as needed for mild pain. 06/17/20   Woodroe Mode, MD    Family History Family History  Problem Relation Age of Onset   Hypertension Mother    Diabetes Mellitus I Father    Chronic Renal Failure Father     Social History Social History   Tobacco Use   Smoking status: Some Days    Packs/day: 0.25    Years: 0.50    Total pack years: 0.13    Types: Cigarettes   Smokeless tobacco: Never  Vaping Use   Vaping Use: Never used  Substance Use Topics   Alcohol use: Yes    Alcohol/week: 1.0 standard drink of alcohol    Types: 1 Standard drinks or equivalent per week    Comment: occasional   Drug use: Not Currently    Types: Marijuana    Comment: Last use-08/2019     Allergies   Vicodin [hydrocodone-acetaminophen]   Review of Systems Review of Systems as listed above in HPI   Physical Exam Triage Vital Signs ED Triage Vitals  Enc Vitals Group     BP 02/25/23 1034 (!) 151/100     Pulse Rate 02/25/23 1034 80     Resp 02/25/23 1034 18     Temp 02/25/23 1034 98.4 F (36.9 C)     Temp Source 02/25/23 1034 Oral     SpO2 02/25/23 1034 98 %     Weight --      Height --      Head Circumference --      Peak Flow --      Pain Score 02/25/23 1035 0     Pain Loc --      Pain Edu? --      Excl. in [place]? --    No data found.  Updated Vital Signs BP (!) 151/100 (BP Location: Right Arm)   Pulse 80   Temp 98.4 F (36.9 C) (Oral)   Resp 18   LMP 02/25/2023   SpO2 98%   Breastfeeding No   Physical Exam Vitals reviewed. Exam conducted with a chaperone present Mearl Latin).  Constitutional:      General: She is not in acute distress.    Appearance: Normal appearance. She is not ill-appearing, toxic-appearing or diaphoretic.   HENT:     Head: Normocephalic.  Cardiovascular:     Rate and Rhythm: Normal rate.  Pulmonary:     Effort: Pulmonary effort is normal.  Genitourinary:    Labia:        Right: No rash or lesion.        Left: No rash or lesion.      Comments: Menstrual bleeding vaginal canal Neurological:     Mental Status: She is alert.      UC Treatments / Results  Labs (all labs ordered are listed, but only abnormal results are displayed) Labs Reviewed  CERVICOVAGINAL ANCILLARY ONLY    EKG   Radiology No results found.  Procedures Procedures (including critical care time)  Medications Ordered in UC Medications - No data to display  Initial Impression / Assessment and Plan / UC Course  I have reviewed the triage vital signs and the nursing notes.  Pertinent labs & imaging results that were available during my care of the patient were reviewed by me and considered in my medical decision making (see chart for details).     Vaginal itching STI testing collected today, urgent care staff will call you with abnormal results and appropriate treatment.  Counseled her to abstain from sexual intercourse until results have returned and partner has been treated appropriately.  New Rx, 2% viscous lidocaine jelly to apply topically to the labia to help with the discomfort and itching that she may return to work.  Follow-up with primary care provider if symptoms worsen or fail to improve. Final Clinical Impressions(s) / UC Diagnoses   Final diagnoses:  Vaginal itching     Discharge Instructions      We have checked you today for sexually transmitted infections, yeast and BV. Your results will be available on your MyChart.  Urgent care staff will call you with any abnormal results and appropriate treatment.  I have sent to your pharmacy some topical lidocaine gel to use 3 times a day to help with your symptoms.  Do not put this medication in your vagina only on the outside labia.  Refrain from  any sexual activity until your results have returned and you and your partner have completed treatment as appropriate.     ED Prescriptions     Medication Sig Dispense Auth. Provider   lidocaine (XYLOCAINE) 2 % jelly Apply 1 Application topically as needed (up to 4 times a day). 30 mL Rodena Goldmann A, DO      PDMP not reviewed this encounter.   Rodena Goldmann A, DO 02/25/23 1101

## 2023-02-25 NOTE — Discharge Instructions (Addendum)
We have checked you today for sexually transmitted infections, yeast and BV. Your results will be available on your MyChart.  Urgent care staff will call you with any abnormal results and appropriate treatment.  I have sent to your pharmacy some topical lidocaine gel to use 3 times a day to help with your symptoms.  Do not put this medication in your vagina only on the outside labia.  Refrain from any sexual activity until your results have returned and you and your partner have completed treatment as appropriate.

## 2023-02-25 NOTE — ED Triage Notes (Signed)
Pt c/o vaginal irritation since Sunday with itching and tender. States had small discharge with no odor for a few days and started her cycle today. States she shaved on Thursday prior to irritation.

## 2023-02-26 ENCOUNTER — Telehealth (HOSPITAL_COMMUNITY): Payer: Self-pay

## 2023-02-26 ENCOUNTER — Encounter (HOSPITAL_COMMUNITY): Payer: Self-pay

## 2023-02-26 LAB — CERVICOVAGINAL ANCILLARY ONLY
Bacterial Vaginitis (gardnerella): NEGATIVE
Candida Glabrata: NEGATIVE
Candida Vaginitis: POSITIVE — AB
Chlamydia: NEGATIVE
Comment: NEGATIVE
Comment: NEGATIVE
Comment: NEGATIVE
Comment: NEGATIVE
Comment: NEGATIVE
Comment: NORMAL
Neisseria Gonorrhea: NEGATIVE
Trichomonas: POSITIVE — AB

## 2023-02-26 MED ORDER — FLUCONAZOLE 150 MG PO TABS: 150.0000 mg | ORAL_TABLET | Freq: Every day | ORAL | 0 refills | Status: DC

## 2023-02-26 MED ORDER — METRONIDAZOLE 500 MG PO TABS: 500.0000 mg | ORAL_TABLET | Freq: Two times a day (BID) | ORAL | 0 refills | Status: DC

## 2023-02-26 NOTE — Telephone Encounter (Signed)
Call patient to advised her of most recent test results. Medication has sent to [facility]. See note under patient results.

## 2023-04-15 ENCOUNTER — Ambulatory Visit (HOSPITAL_COMMUNITY)
Admission: EM | Admit: 2023-04-15 | Discharge: 2023-04-15 | Disposition: A | Discharge status: Home / Self Care | Payer: Medicaid Other | Attending: Family Medicine | Admitting: Family Medicine

## 2023-04-15 ENCOUNTER — Encounter (HOSPITAL_COMMUNITY): Payer: Self-pay | Admitting: *Deleted

## 2023-04-15 DIAGNOSIS — N898 Other specified noninflammatory disorders of vagina: Secondary | ICD-10-CM | POA: Diagnosis not present

## 2023-04-15 MED ORDER — CLOTRIMAZOLE-BETAMETHASONE 1-0.05 % EX CREA: TOPICAL_CREAM | CUTANEOUS | 0 refills | Status: DC

## 2023-04-15 MED ORDER — FLUCONAZOLE 150 MG PO TABS: 150.0000 mg | ORAL_TABLET | Freq: Every day | ORAL | 0 refills | Status: DC

## 2023-04-15 MED ORDER — METRONIDAZOLE 500 MG PO TABS: 500.0000 mg | ORAL_TABLET | Freq: Two times a day (BID) | ORAL | 0 refills | Status: DC

## 2023-04-15 NOTE — ED Triage Notes (Signed)
Pt states she started having vaginal irritation X 2 weeks. She states she started having slimy vaginal discharge X 2 weeks as well.   She states she got some lidocaine in the past and she would like a refill.

## 2023-04-15 NOTE — Discharge Instructions (Signed)
You were seen today for vaginal issues.  I have treated you for BV and yeast with medications to the pharmacy.  Your swab will be resulted tomorrow and if anything else is positive we will call an notify you.

## 2023-04-15 NOTE — ED Provider Notes (Signed)
[facility]    CSN: [id number] Arrival date & time: 04/15/23  9562      History   Chief Complaint Chief Complaint  Patient presents with   Vaginal Discharge   Vaginal Itching    HPI Beth Gibson is a 37 y.o. female.   Patient is here for vaginal d/c and itching.  This started after her period, around the lips, not internal.  She then noted some swelling, she thinks b/c of all the itching/rubbing with a rag.  Thick/white d/c.  She was treated for vaginal infection 2 months ago, symptoms resolved at that time, no intercourse since.  No dysuria or frequency.        Past Medical History:  Diagnosis Date   Abscess of axilla, right    Abscess of right axilla 06/20/2018   Anemia    Asthma    NO MEDS, SOB with activity   BV (bacterial vaginosis)    Chlamydia 2020   Depression    Gonorrhea 2020   Headache(784.0)    MIGRAINES   History of blood transfusion 2013   with child birth   PONV (postoperative nausea and vomiting)    nausea   Preterm labor    Previous cesarean delivery, antepartum condition or complication 12/10/2011   Classical at 28 weeks, plan repeat at 36 weeks - classical per [facility]    Status post elective abortion 12/10/2016   Trichomonas    UTI (lower urinary tract infection)    Yeast vaginitis     Patient Active Problem List   Diagnosis Date Noted   Axillary abscess 04/03/2021   Chronic cystitis 05/06/2020   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   DUB (dysfunctional uterine bleeding) 03/25/2020   Severe episode of recurrent major depressive disorder, with psychotic features    MDD (major depressive disorder) 03/16/2016   Anemia due to chronic blood loss 11/01/2014   Obesity (BMI 30-39.9) 12/10/2011    Past Surgical History:  Procedure Laterality Date   CESAREAN SECTION     CESAREAN SECTION  04/18/2012   Procedure: CESAREAN SECTION;  Surgeon: Lesly Dukes, MD;  Location: [facility];  Service: Gynecology;  Laterality: N/A;  repeat    cyst removed from L buttock     CYSTECTOMY  2009   left buttock   DILATION AND EVACUATION N/A 02/24/2017   Procedure: DILATATION AND EVACUATION;  Surgeon: Willodean Rosenthal, MD;  Location: [facility];  Service: Gynecology;  Laterality: N/A;   HYDRADENITIS EXCISION Right 11/04/2018   Procedure: EXCISION HIDRADENITIS RIGHT  AXILLA;  Surgeon: Griselda Miner, MD;  Location: [facility];  Service: General;  Laterality: Right;   INCISION AND DRAINAGE ABSCESS Right 11/01/2014   Procedure: INCISION AND DRAINAGE RIGHT AXILLARY ABSCESS;  Surgeon: Romie Levee, MD;  Location: [facility];  Service: General;  Laterality: Right;   INCISION AND DRAINAGE ABSCESS Right 06/21/2018   Procedure: INCISION AND DRAINAGE RIGHT ABSCESS AXILLA;  Surgeon: Griselda Miner, MD;  Location: [facility];  Service: General;  Laterality: Right;   INCISION AND DRAINAGE ABSCESS Right 04/03/2021   Procedure: INCISION AND DRAINAGE RIGHT AXILLA ABSCESS;  Surgeon: Manus Rudd, MD;  Location[facility];  Service: General;  Laterality: Right;   IRRIGATION AND DEBRIDEMENT ABSCESS Right 08/09/2015   Procedure: IRRIGATION AND DEBRIDEMENT AXILLARY ABSCESS;  Surgeon: Ovidio Kin, MD;  Location: [facility];  Service: General;  Laterality: Right;   WISDOM TOOTH EXTRACTION     WISDOM TOOTH EXTRACTION  2009  OB History     Gravida  5   Para  2   Term  0   Preterm  2   AB  3   Living  2      SAB  1   IAB  0   Ectopic  0   Multiple  0   Live Births  2            Home Medications    Prior to Admission medications   Medication Sig Start Date End Date Taking? Authorizing Provider  amLODipine (NORVASC) 10 MG tablet SMARTSIG:1 Tablet(s) By Mouth Every Evening 12/23/22  Yes [provider]  lisinopril-hydrochlorothiazide (ZESTORETIC) 20-12.5 MG tablet Take 1 tablet by mouth daily. 12/23/22  Yes [provider]  fluconazole (DIFLUCAN) 150 MG tablet Take 1 tablet (150 mg total) by mouth daily. 02/26/23    Merrilee Jansky, MD  ibuprofen (ADVIL) 600 MG tablet Take 1 tablet (600 mg total) by mouth every 6 (six) hours as needed. Patient taking differently: Take 600 mg by mouth every 6 (six) hours as needed for mild pain. 06/17/20   Adam Phenix, MD  lidocaine (XYLOCAINE) 2 % jelly Apply 1 Application topically as needed (up to 4 times a day). 02/25/23   Claudie Leach, DO  metroNIDAZOLE (FLAGYL) 500 MG tablet Take 1 tablet (500 mg total) by mouth 2 (two) times daily. 02/26/23   LampteyBritta Mccreedy, MD    Family History Family History  Problem Relation Age of Onset   Hypertension Mother    Diabetes Mellitus I Father    Chronic Renal Failure Father     Social History Social History   Tobacco Use   Smoking status: Some Days    Packs/day: 0.25    Years: 0.50    Additional pack years: 0.00    Total pack years: 0.13    Types: Cigarettes   Smokeless tobacco: Never  Vaping Use   Vaping Use: Never used  Substance Use Topics   Alcohol use: Yes    Alcohol/week: 1.0 standard drink of alcohol    Types: 1 Standard drinks or equivalent per week    Comment: occasional   Drug use: Not Currently    Types: Marijuana    Comment: Last use-08/2019     Allergies   Vicodin [hydrocodone-acetaminophen]   Review of Systems Review of Systems  Constitutional: Negative.   HENT: Negative.    Respiratory: Negative.    Cardiovascular: Negative.   Gastrointestinal: Negative.   Genitourinary:  Positive for vaginal discharge.     Physical Exam Triage Vital Signs ED Triage Vitals  Enc Vitals Group     BP 04/15/23 0944 (!) 191/79     Pulse Rate 04/15/23 0944 84     Resp 04/15/23 0944 18     Temp 04/15/23 0944 99.1 F (37.3 C)     Temp Source 04/15/23 0944 Oral     SpO2 04/15/23 0944 99 %     Weight --      Height --      Head Circumference --      Peak Flow --      Pain Score 04/15/23 0942 0     Pain Loc --      Pain Edu? --      Excl. in [place]? --    No data found.  Updated Vital  Signs BP (!) 191/79 (BP Location: Right Arm)   Pulse 84   Temp 99.1 F (37.3 C) (Oral)  Resp 18   LMP 04/04/2023 (Approximate)   SpO2 99%   Visual Acuity Right Eye Distance:   Left Eye Distance:   Bilateral Distance:    Right Eye Near:   Left Eye Near:    Bilateral Near:     Physical Exam Constitutional:      Appearance: Normal appearance.  Cardiovascular:     Rate and Rhythm: Normal rate.  Pulmonary:     Effort: Pulmonary effort is normal.  Skin:    General: Skin is warm.  Neurological:     General: No focal deficit present.     Mental Status: She is alert.  Psychiatric:        Mood and Affect: Mood normal.      UC Treatments / Results  Labs (all labs ordered are listed, but only abnormal results are displayed) Labs Reviewed  CERVICOVAGINAL ANCILLARY ONLY    EKG   Radiology No results found.  Procedures Procedures (including critical care time)  Medications Ordered in UC Medications - No data to display  Initial Impression / Assessment and Plan / UC Course  I have reviewed the triage vital signs and the nursing notes.  Pertinent labs & imaging results that were available during my care of the patient were reviewed by me and considered in my medical decision making (see chart for details).  Final Clinical Impressions(s) / UC Diagnoses   Final diagnoses:  Vaginal itching  Vaginal discharge     Discharge Instructions      You were seen today for vaginal issues.  I have treated you for BV and yeast with medications to the pharmacy.  Your swab will be resulted tomorrow and if anything else is positive we will call an notify you.      ED Prescriptions     Medication Sig Dispense Auth. Provider   metroNIDAZOLE (FLAGYL) 500 MG tablet Take 1 tablet (500 mg total) by mouth 2 (two) times daily. 14 tablet Caswell Alvillar, MD   fluconazole (DIFLUCAN) 150 MG tablet Take 1 tablet (150 mg total) by mouth daily. 1 tablet Nettie Cromwell, MD    clotrimazole-betamethasone (LOTRISONE) cream Apply to affected area 2 times daily prn 15 g Jannifer Franklin, MD      PDMP not reviewed this encounter.   Jannifer Franklin, MD 04/15/23 1020

## 2023-04-16 LAB — CERVICOVAGINAL ANCILLARY ONLY
Bacterial Vaginitis (gardnerella): NEGATIVE
Candida Glabrata: NEGATIVE
Candida Vaginitis: POSITIVE — AB
Chlamydia: NEGATIVE
Comment: NEGATIVE
Comment: NEGATIVE
Comment: NEGATIVE
Comment: NEGATIVE
Comment: NEGATIVE
Comment: NORMAL
Neisseria Gonorrhea: NEGATIVE
Trichomonas: NEGATIVE

## 2023-05-12 ENCOUNTER — Ambulatory Visit (HOSPITAL_COMMUNITY)
Admission: EM | Admit: 2023-05-12 | Discharge: 2023-05-12 | Disposition: A | Discharge status: Home / Self Care | Payer: Self-pay | Attending: Emergency Medicine | Admitting: Emergency Medicine

## 2023-05-12 ENCOUNTER — Encounter (HOSPITAL_COMMUNITY): Payer: Self-pay | Admitting: Emergency Medicine

## 2023-05-12 ENCOUNTER — Other Ambulatory Visit: Payer: Self-pay

## 2023-05-12 DIAGNOSIS — B3731 Acute candidiasis of vulva and vagina: Secondary | ICD-10-CM | POA: Insufficient documentation

## 2023-05-12 LAB — POCT URINALYSIS DIP (MANUAL ENTRY)
Blood, UA: NEGATIVE
Glucose, UA: NEGATIVE mg/dL
Nitrite, UA: NEGATIVE
Spec Grav, UA: >=1.03 — AB (ref 1.010–1.025)
Urobilinogen, UA: 1 E.U./dL
pH, UA: 6 (ref 5.0–8.0)

## 2023-05-12 LAB — POCT URINE PREGNANCY: Preg Test, Ur: NEGATIVE

## 2023-05-12 MED ORDER — LIDOCAINE 5 % EX OINT: 1.0000 | TOPICAL_OINTMENT | Freq: Four times a day (QID) | CUTANEOUS | 0 refills | Status: DC | PRN

## 2023-05-12 MED ORDER — CLOTRIMAZOLE 1 % VA CREA: TOPICAL_CREAM | VAGINAL | 0 refills | Status: DC

## 2023-05-12 MED ORDER — FLUCONAZOLE 150 MG PO TABS: ORAL_TABLET | ORAL | 0 refills | Status: DC

## 2023-05-12 NOTE — Discharge Instructions (Signed)
Based on the symptoms, images and concerns you shared with me today, you were treated for presumed vaginal yeast infection with fluconazole (Diflucan), take the first tablet today, take the second tablet three days after the first tablet and take the third tablet 3.  I recommend that you abstain from sexual intercourse, tampon use or any other other intravaginal activities while being treated as well as for a week after being treated.     Based on the symptoms and concerns you shared with me today, you were treated for presumed vaginal yeast infection with fluconazole (Diflucan), take the first tablet today and take the second tablet three days after the first tablet.  I recommend that you abstain from sexual intercourse, tampon use or any other other intravaginal activities while being treated.     Based on the symptoms and concerns you shared with me today, you were treated for presumed vaginal yeast infection with fluconazole (Diflucan), take the first tablet today and take the second tablet three days after the first tablet.  I recommend that you abstain from sexual intercourse, tampon use or any other other intravaginal activities while being treated as well as for a full week after being treated.     For comfort, while you are waiting for Diflucan to take full effect, I have also provided you with an external yeast infection cream called clotrimazole and a pain relief cream called lidocaine, both of which you can apply 3-4 times daily for relief of vaginal itching and burning.      Your urine pregnancy test today is negative.   Our point-of-care analysis of your urine sample today was normal and did not reveal any concern for urinary tract infection.   The results of your vaginal swab test which tests for yeast as well as BV, gonorrhea, chlamydia and trichomonas, will be posted to your MyChart account in the next 3 to 5 days.  If any of your results are abnormal, you will receive a phone call  regarding further treatment.  Additional prescriptions, if any are needed, will be provided for you at your pharmacy.   Please abstain from sexual intercourse of any kind, vaginal, oral or anal, until until you have received the results of your test.   If you have not had complete resolution of your symptoms after completing any recommended treatment or if your symptoms worsen, please return for repeat evaluation.   Thank you for visiting urgent care today.  I appreciate the opportunity to participate in your care.

## 2023-05-12 NOTE — ED Triage Notes (Signed)
Reported genital irritation, outside of vagina.  Describes skin is raw on left labia.  Reports a thick "cottage cheese" discharge.  Denies odor.  Patient suspects a yeast infection.    Patient has used probiotics (inserts)-felt like irritation increased

## 2023-05-12 NOTE — ED Provider Notes (Signed)
[facility]    CSN: [id number] Arrival date & time: 05/12/23  0841    HISTORY  No chief complaint on file.  HPI Beth Gibson is a pleasant, 37 y.o. female who presents to urgent care today. Patient reports being tested and diagnosed with a vaginal yeast infection on April 15, 2023 here at this location.  Patient states that she was prescribed treatment however her insurance would not pay for all of it.  EMR reviewed, patient was prescribed clotrimazole-betamethasone cream which Medicaid would not pay for an 1 tablet of Diflucan.  Patient states she took a single tablet of Diflucan which helped a little bit but symptoms came back almost immediately.  At this time, she has significant vaginal irritation (patient has an image of her vulva on her phone showing diffuse erythema and some skin breakdown) and thick, cottage cheeselike discharge (patient has an image on her phone of the discharge on a washcloth which looks like a thick wad of off-white cottage cheese).  Patient denies unusual vaginal odor.  Patient states she believes that her vaginal yeast infection is not only unresolved but is significantly worse.  Patient states she has been using probiotic inserts without relief.  The history is provided by the patient. No language interpreter was used.   Past Medical History:  Diagnosis Date   Abscess of axilla, right    Abscess of right axilla 06/20/2018   Anemia    Asthma    NO MEDS, SOB with activity   BV (bacterial vaginosis)    Chlamydia 2020   Depression    Gonorrhea 2020   Headache(784.0)    MIGRAINES   History of blood transfusion 2013   with child birth   PONV (postoperative nausea and vomiting)    nausea   Preterm labor    Previous cesarean delivery, antepartum condition or complication 12/10/2011   Classical at 28 weeks, plan repeat at 36 weeks - classical per [facility]    Status post elective abortion 12/10/2016   Trichomonas    UTI (lower urinary tract  infection)    Yeast vaginitis    Patient Active Problem List   Diagnosis Date Noted   Axillary abscess 04/03/2021   Chronic cystitis 05/06/2020   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   DUB (dysfunctional uterine bleeding) 03/25/2020   Severe episode of recurrent major depressive disorder, with psychotic features (HCC)    MDD (major depressive disorder) 03/16/2016   Anemia due to chronic blood loss 11/01/2014   Obesity (BMI 30-39.9) 12/10/2011   Past Surgical History:  Procedure Laterality Date   CESAREAN SECTION     CESAREAN SECTION  04/18/2012   Procedure: CESAREAN SECTION;  Surgeon: Lesly Dukes, MD;  Location: [facility];  Service: Gynecology;  Laterality: N/A;  repeat   cyst removed from L buttock     CYSTECTOMY  2009   left buttock   DILATION AND EVACUATION N/A 02/24/2017   Procedure: DILATATION AND EVACUATION;  Surgeon: Willodean Rosenthal, MD;  Location: [facility];  Service: Gynecology;  Laterality: N/A;   HYDRADENITIS EXCISION Right 11/04/2018   Procedure: EXCISION HIDRADENITIS RIGHT  AXILLA;  Surgeon: Griselda Miner, MD;  Location: [facility];  Service: General;  Laterality: Right;   INCISION AND DRAINAGE ABSCESS Right 11/01/2014   Procedure: INCISION AND DRAINAGE RIGHT AXILLARY ABSCESS;  Surgeon: Romie Levee, MD;  Location: [facility];  Service: General;  Laterality: Right;   INCISION AND DRAINAGE ABSCESS Right 06/21/2018   Procedure:  INCISION AND DRAINAGE RIGHT ABSCESS AXILLA;  Surgeon: Griselda Miner, MD;  Location: [facility];  Service: General;  Laterality: Right;   INCISION AND DRAINAGE ABSCESS Right 04/03/2021   Procedure: INCISION AND DRAINAGE RIGHT AXILLA ABSCESS;  Surgeon: Manus Rudd, MD;  Location: [facility];  Service: General;  Laterality: Right;   IRRIGATION AND DEBRIDEMENT ABSCESS Right 08/09/2015   Procedure: IRRIGATION AND DEBRIDEMENT AXILLARY ABSCESS;  Surgeon: Ovidio Kin, MD;  Location: [facility];  Service: General;  Laterality: Right;   WISDOM TOOTH  EXTRACTION     WISDOM TOOTH EXTRACTION  2009   OB History     Gravida  5   Para  2   Term  0   Preterm  2   AB  3   Living  2      SAB  1   IAB  0   Ectopic  0   Multiple  0   Live Births  2          Home Medications    Prior to Admission medications   Medication Sig Start Date End Date Taking? Authorizing Provider  amLODipine (NORVASC) 10 MG tablet SMARTSIG:1 Tablet(s) By Mouth Every Evening Patient not taking: Reported on 05/12/2023 12/23/22   [provider]  clotrimazole-betamethasone (LOTRISONE) cream Apply to affected area 2 times daily prn Patient not taking: Reported on 05/12/2023 04/15/23   Jannifer Franklin, MD  fluconazole (DIFLUCAN) 150 MG tablet Take 1 tablet (150 mg total) by mouth daily. Patient not taking: Reported on 05/12/2023 04/15/23   Jannifer Franklin, MD  lisinopril-hydrochlorothiazide (ZESTORETIC) 20-12.5 MG tablet Take 1 tablet by mouth daily. Patient not taking: Reported on 05/12/2023 12/23/22   [provider]  metroNIDAZOLE (FLAGYL) 500 MG tablet Take 1 tablet (500 mg total) by mouth 2 (two) times daily. Patient not taking: Reported on 05/12/2023 04/15/23   Jannifer Franklin, MD    Family History Family History  Problem Relation Age of Onset   Hypertension Mother    Diabetes Mellitus I Father    Chronic Renal Failure Father    Social History Social History   Tobacco Use   Smoking status: Some Days    Packs/day: 0.25    Years: 0.50    Additional pack years: 0.00    Total pack years: 0.13    Types: Cigarettes   Smokeless tobacco: Never  Vaping Use   Vaping Use: Never used  Substance Use Topics   Alcohol use: Not Currently    Alcohol/week: 1.0 standard drink of alcohol    Types: 1 Standard drinks or equivalent per week    Comment: occasional   Drug use: Not Currently    Types: Marijuana    Comment: Last use-08/2019   Allergies   Vicodin [hydrocodone-acetaminophen]  Review of Systems Review of Systems Pertinent  findings revealed after performing a 14 point review of systems has been noted in the history of present illness.  Physical Exam Vital Signs BP (!) 136/98 (BP Location: Left Arm) Comment (BP Location): large cuff  Pulse 96   Temp 98.7 F (37.1 C) (Oral)   Resp 20   LMP 04/23/2023 (Approximate)   SpO2 97%   No data found.  Physical Exam  Visual Acuity Right Eye Distance:   Left Eye Distance:   Bilateral Distance:    Right Eye Near:   Left Eye Near:    Bilateral Near:     UC Couse / Diagnostics / Procedures:     Radiology No results found.  Procedures Procedures (including critical care time) EKG  Pending results:  Labs Reviewed  POCT URINALYSIS DIP (MANUAL ENTRY)  POCT URINE PREGNANCY  CERVICOVAGINAL ANCILLARY ONLY    Medications Ordered in UC: Medications - No data to display  UC Diagnoses / Final Clinical Impressions(s)   I have reviewed the triage vital signs and the nursing notes.  Pertinent labs & imaging results that were available during my care of the patient were reviewed by me and considered in my medical decision making (see chart for details).    Final diagnoses:  None   Patient was provided with Diflucan 150 mg every 3 days x 3 for empiric treatment of presumed vulvovaginal candidiasis based on the history provided to me today given recurrence of Diflucan and failure of initial treatment as well as significant vaginal irritation and copious thick, curd like vaginal discharge. Patient was provided with Lidocaine 5% ointment and clotrimazole cream both to be applied as needed for treatment of vulvovaginal irritation based on the history provided to me today. Patient was advised to abstain from sexual intercourse for the next 7 days while being treated.  Patient was also advised to use condoms to protect themselves from STD exposure. STD screening was performed, patient advised that the results be posted to their MyChart and if any of the results are  positive, they will be notified by phone, further treatment will be provided as indicated based on results of STD screening. Urinalysis today was abnormal.  It was concerning for vaginal irritation. Urine pregnancy test was negative. Return precautions advised.  Drug allergies reviewed, all questions addressed.   Please see discharge instructions below for details of plan of care as provided to patient. ED Prescriptions   None    PDMP not reviewed this encounter.  Disposition Upon Discharge:  Condition: stable for discharge home  Patient presented with concern for an acute illness with associated systemic symptoms and significant discomfort requiring urgent management. In my opinion, this is a condition that a prudent lay person (someone who possesses an average knowledge of health and medicine) may potentially expect to result in complications if not addressed urgently such as respiratory distress, impairment of bodily function or dysfunction of bodily organs.   As such, the patient has been evaluated and assessed, work-up was performed and treatment was provided in alignment with urgent care protocols and evidence based medicine.  Patient/parent/caregiver has been advised that the patient may require follow up for further testing and/or treatment if the symptoms continue in spite of treatment, as clinically indicated and appropriate.  Routine symptom specific, illness specific and/or disease specific instructions were discussed with the patient and/or caregiver at length.  Prevention strategies for avoiding STD exposure were also discussed.  The patient will follow up with their current PCP if and as advised. If the patient does not currently have a PCP we will assist them in obtaining one.   The patient may need specialty follow up if the symptoms continue, in spite of conservative treatment and management, for further workup, evaluation, consultation and treatment as clinically indicated  and appropriate.  Patient/parent/caregiver verbalized understanding and agreement of plan as discussed.  All questions were addressed during visit.  Please see discharge instructions below for further details of plan.  Discharge Instructions: Discharge Instructions   None     This office note has been dictated using Dragon speech recognition software.  Unfortunately, this method of dictation can sometimes lead to typographical or grammatical errors.  I apologize for your  inconvenience in advance if this occurs.  Please do not hesitate to reach out to me if clarification is needed.       Theadora Rama Scales, [place] 05/12/23 [phone]

## 2023-05-13 LAB — CERVICOVAGINAL ANCILLARY ONLY
Bacterial Vaginitis (gardnerella): NEGATIVE
Candida Glabrata: NEGATIVE
Candida Vaginitis: POSITIVE — AB
Chlamydia: NEGATIVE
Comment: NEGATIVE
Comment: NEGATIVE
Comment: NEGATIVE
Comment: NEGATIVE
Comment: NEGATIVE
Comment: NORMAL
Neisseria Gonorrhea: NEGATIVE
Trichomonas: NEGATIVE

## 2023-05-17 ENCOUNTER — Other Ambulatory Visit: Payer: Self-pay

## 2023-05-17 ENCOUNTER — Emergency Department (HOSPITAL_COMMUNITY)
Admission: EM | Admit: 2023-05-17 | Discharge: 2023-05-17 | Disposition: A | Discharge status: Home / Self Care | Payer: Self-pay | Attending: Emergency Medicine | Admitting: Emergency Medicine

## 2023-05-17 ENCOUNTER — Encounter (HOSPITAL_COMMUNITY): Payer: Self-pay

## 2023-05-17 DIAGNOSIS — L02411 Cutaneous abscess of right axilla: Secondary | ICD-10-CM | POA: Insufficient documentation

## 2023-05-17 MED ORDER — OXYCODONE HCL 5 MG PO TABS: 5.0000 mg | ORAL_TABLET | ORAL | 0 refills | Status: DC | PRN

## 2023-05-17 MED ORDER — AMOXICILLIN-POT CLAVULANATE 875-125 MG PO TABS: 1.0000 | ORAL_TABLET | Freq: Two times a day (BID) | ORAL | 0 refills | Status: DC

## 2023-05-17 MED ORDER — SULFAMETHOXAZOLE-TRIMETHOPRIM 800-160 MG PO TABS: 1.0000 | ORAL_TABLET | Freq: Two times a day (BID) | ORAL | 0 refills | Status: AC

## 2023-05-17 NOTE — Discharge Instructions (Addendum)
You were seen for your abscess in the emergency department.   At home, please take the antibiotics (bactrim) we have prescribed you.  Take Tylenol and Motrin for your pain.  You may take the oxycodone we prescribed you for any breakthrough pain.  Follow-up with your [place] surgery in 2-3 days regarding your visit.    Return immediately to the emergency department if you experience any of the following: Fevers above 100.4 F, or any other concerning symptoms.    Thank you for visiting our Emergency Department. It was a pleasure taking care of you today.

## 2023-05-17 NOTE — ED Provider Notes (Signed)
  [place] EMERGENCY DEPARTMENT AT [facility] Provider Note   CSN: [id number] Arrival date & time: 05/17/23  1753     History {Add pertinent medical, surgical, social history, OB history to HPI:1} Chief Complaint  Patient presents with   Abscess    Beth Gibson is a 37 y.o. female.  Abscess to right under arm.  Reports it covers the whole axilla.   No fevers  Typically has to have surgery to have them drained       Home Medications Prior to Admission medications   Medication Sig Start Date End Date Taking? Authorizing Provider  clotrimazole (GYNE-LOTRIMIN) 1 % vaginal cream Apply to external vaginal area 3-4 times daily as needed for vulvovaginitis. 05/12/23   Theadora Rama Scales, PA-C  fluconazole (DIFLUCAN) 150 MG tablet Take first tablet on day 1.  Take second tablet 3 days after first tablet.  Take third tablet 3 days after second tablet. 05/12/23   Theadora Rama Scales, PA-C  lidocaine (XYLOCAINE) 5 % ointment Apply 1 Application topically 4 (four) times daily as needed. 05/12/23   Theadora Rama Scales, PA-C      Allergies    Vicodin [hydrocodone-acetaminophen]    Review of Systems   Review of Systems  Physical Exam Updated Vital Signs BP (!) 184/113 (BP Location: Left Arm)   Pulse (!) 103   Temp 98.5 F (36.9 C) (Oral)   Resp 18   Ht 5\' 8"  (1.727 m)   Wt 91.2 kg   LMP 04/23/2023 (Approximate)   SpO2 100%   BMI 30.56 kg/m  Physical Exam  ED Results / Procedures / Treatments   Labs (all labs ordered are listed, but only abnormal results are displayed) Labs Reviewed - No data to display  EKG None  Radiology No results found.  Procedures Procedures  {Document cardiac monitor, telemetry assessment procedure when appropriate:1}  Medications Ordered in ED Medications - No data to display  ED Course/ Medical Decision Making/ A&P   {   Click here for ABCD2, HEART and other calculatorsREFRESH Note before signing :1}                           Medical Decision Making  ***  {Document critical care time when appropriate:1} {Document review of labs and clinical decision tools ie heart score, Chads2Vasc2 etc:1}  {Document your independent review of radiology images, and any outside records:1} {Document your discussion with family members, caretakers, and with consultants:1} {Document social determinants of health affecting pt's care:1} {Document your decision making why or why not admission, treatments were needed:1} Final Clinical Impression(s) / ED Diagnoses Final diagnoses:  None    Rx / DC Orders ED Discharge Orders     None

## 2023-05-17 NOTE — ED Triage Notes (Signed)
Abscess to right under arm.  Reports it covers the whole axilla.

## 2023-05-22 ENCOUNTER — Other Ambulatory Visit: Payer: Self-pay

## 2023-05-22 ENCOUNTER — Encounter (HOSPITAL_COMMUNITY): Payer: Self-pay

## 2023-05-22 ENCOUNTER — Emergency Department (HOSPITAL_COMMUNITY)
Admission: EM | Admit: 2023-05-22 | Discharge: 2023-05-22 | Disposition: A | Discharge status: Home / Self Care | Payer: Self-pay | Attending: Emergency Medicine | Admitting: Emergency Medicine

## 2023-05-22 DIAGNOSIS — L02411 Cutaneous abscess of right axilla: Secondary | ICD-10-CM | POA: Insufficient documentation

## 2023-05-22 DIAGNOSIS — R000 Tachycardia, unspecified: Secondary | ICD-10-CM | POA: Insufficient documentation

## 2023-05-22 DIAGNOSIS — J45909 Unspecified asthma, uncomplicated: Secondary | ICD-10-CM | POA: Insufficient documentation

## 2023-05-22 MED ORDER — OXYCODONE-ACETAMINOPHEN 5-325 MG PO TABS: 1.0000 | ORAL_TABLET | Freq: Four times a day (QID) | ORAL | 0 refills | Status: DC | PRN

## 2023-05-22 MED ORDER — OXYCODONE-ACETAMINOPHEN 5-325 MG PO TABS
1.0000 | ORAL_TABLET | Freq: Once | ORAL | Status: AC
  Administered 2023-05-22: 1 via ORAL
  Filled 2023-05-22: qty 1

## 2023-05-22 NOTE — ED Provider Notes (Signed)
[place] EMERGENCY DEPARTMENT AT [facility] Provider Note   CSN: [id number] Arrival date & time: 05/22/23  0946     History  Chief Complaint  Patient presents with   Abscess    Beth Gibson is a 37 y.o. female.  Patient is a 37 year old female with a history of recurrent abscesses, anemia, asthma who is presenting today due to ongoing pain in her right axilla.  Patient reports that she was seen in the emergency room on Monday and did have an abscess in her axilla.  She did not want it lanced at that time and followed up with general surgery on Tuesday where they lanced it and packed it.  She has been slowly removing the packing but reports the pain is becoming excruciating.  She also feels like there is something catching and pulling on the skin.  She did call general surgery but they could not see her yesterday and reported that if things were getting worse she should come to the emergency room.  Patient also reports that she ran out of pain medicine a few days ago and now the pain is so bad she is having a hard time pulling the packing and cutting the string.  She has not noticed any new erythema, no fevers.  She is having a tingling pain in her arm but has not noticed any swelling in the lower part of her arm.  The history is provided by the patient.  Abscess      Home Medications Prior to Admission medications   Medication Sig Start Date End Date Taking? Authorizing Provider  oxyCODONE-acetaminophen (PERCOCET/ROXICET) 5-325 MG tablet Take 1 tablet by mouth every 6 (six) hours as needed for severe pain. 05/22/23  Yes Gwyneth Sprout, MD  clotrimazole (GYNE-LOTRIMIN) 1 % vaginal cream Apply to external vaginal area 3-4 times daily as needed for vulvovaginitis. 05/12/23   Theadora Rama Scales, PA-C  fluconazole (DIFLUCAN) 150 MG tablet Take first tablet on day 1.  Take second tablet 3 days after first tablet.  Take third tablet 3 days after second tablet. 05/12/23    Theadora Rama Scales, PA-C  lidocaine (XYLOCAINE) 5 % ointment Apply 1 Application topically 4 (four) times daily as needed. 05/12/23   Theadora Rama Scales, PA-C  oxyCODONE (ROXICODONE) 5 MG immediate release tablet Take 1 tablet (5 mg total) by mouth every 4 (four) hours as needed for up to 12 doses for severe pain. 05/17/23   Rondel Baton, MD  sulfamethoxazole-trimethoprim (BACTRIM DS) 800-160 MG tablet Take 1 tablet by mouth 2 (two) times daily for 7 days. 05/17/23 05/24/23  Rondel Baton, MD      Allergies    Vicodin [hydrocodone-acetaminophen]    Review of Systems   Review of Systems  Physical Exam Updated Vital Signs BP (!) 151/103 (BP Location: Left Arm)   Pulse (!) 101   Temp 98.4 F (36.9 C) (Oral)   Resp 18   Ht 5\' 8"  (1.727 m)   Wt 94.3 kg   LMP 04/23/2023 (Approximate)   SpO2 100%   BMI 31.63 kg/m  Physical Exam Vitals and nursing note reviewed.  Cardiovascular:     Rate and Rhythm: Tachycardia present.  Pulmonary:     Effort: Pulmonary effort is normal.  Skin:    Comments: Improving abscess in the right axilla.  Packing in place and mild drainage.  No surrounding erythema or induration  Neurological:     Mental Status: She is alert. Mental status is at  baseline.  Psychiatric:        Mood and Affect: Mood normal.     ED Results / Procedures / Treatments   Labs (all labs ordered are listed, but only abnormal results are displayed) Labs Reviewed - No data to display  EKG None  Radiology No results found.  Procedures Procedures    Medications Ordered in ED Medications  oxyCODONE-acetaminophen (PERCOCET/ROXICET) 5-325 MG per tablet 1 tablet (1 tablet Oral Given 05/22/23 1031)    ED Course/ Medical Decision Making/ A&P                             Medical Decision Making Risk Prescription drug management.   Pt with multiple medical problems and comorbidities and presenting today with a complaint that caries a high risk for morbidity  and mortality.  Here today with persistent pain after an abscess drainage in her axilla.  Denies systemic symptoms concerning for sepsis.  No swelling in the lower arm or erythema.  Will give pain control and once she is in a room we will examine her arm to ensure does not need further I&D.  10:49 AM Abscess appears to be healing.  Packing pulled out some more and trimmed.  New bandage applied.  Patient is still currently on antibiotics and at this time we will give further pain control and she will follow-up with surgery as planned on Thursday.        Final Clinical Impression(s) / ED Diagnoses Final diagnoses:  Abscess of axilla, right    Rx / DC Orders ED Discharge Orders          Ordered    oxyCODONE-acetaminophen (PERCOCET/ROXICET) 5-325 MG tablet  Every 6 hours PRN        05/22/23 1049              Gwyneth Sprout, MD 05/22/23 1049

## 2023-05-22 NOTE — ED Triage Notes (Signed)
Pt came to ED for right armpit pain, pt was recently here for abscess under armpit and got it drianed, pt states its very painful to touch. Axox4. VSS

## 2023-05-22 NOTE — Discharge Instructions (Addendum)
Continue to pull packing out and finish antibiotics.

## 2023-09-04 ENCOUNTER — Emergency Department (HOSPITAL_COMMUNITY)
Admission: EM | Admit: 2023-09-04 | Discharge: 2023-09-04 | Disposition: A | Discharge status: Home / Self Care | Payer: Medicaid Other | Attending: Emergency Medicine | Admitting: Emergency Medicine

## 2023-09-04 ENCOUNTER — Other Ambulatory Visit: Payer: Self-pay

## 2023-09-04 ENCOUNTER — Encounter (HOSPITAL_COMMUNITY): Payer: Self-pay | Admitting: Emergency Medicine

## 2023-09-04 ENCOUNTER — Emergency Department (HOSPITAL_COMMUNITY)
Admission: EM | Admit: 2023-09-04 | Discharge: 2023-09-04 | Disposition: A | Discharge status: Home / Self Care | Payer: No Typology Code available for payment source | Source: Home / Self Care

## 2023-09-04 DIAGNOSIS — L0501 Pilonidal cyst with abscess: Secondary | ICD-10-CM | POA: Insufficient documentation

## 2023-09-04 LAB — PREGNANCY, URINE: Preg Test, Ur: NEGATIVE

## 2023-09-04 MED ORDER — IBUPROFEN 600 MG PO TABS: 600.0000 mg | ORAL_TABLET | Freq: Four times a day (QID) | ORAL | 0 refills | Status: DC | PRN

## 2023-09-04 MED ORDER — SULFAMETHOXAZOLE-TRIMETHOPRIM 800-160 MG PO TABS: 1.0000 | ORAL_TABLET | Freq: Two times a day (BID) | ORAL | 0 refills | Status: AC

## 2023-09-04 NOTE — ED Triage Notes (Signed)
PT arrives via POV. PT reports a cyst near her tailbone since Wednesday. Denies drainage. Pt AxOx4. Reports hx of pilonidal cyst.

## 2023-09-04 NOTE — ED Provider Notes (Signed)
[place] EMERGENCY DEPARTMENT AT [facility] Provider Note   CSN: [id number] Arrival date & time: 09/04/23  1122     History  Chief Complaint  Patient presents with   Abscess    Beth Gibson is a 37 y.o. female.  The history is provided by the patient and medical records. No language interpreter was used.  Abscess    37 year old female significant history of pilonidal cyst, anemia, depression, presenting with complaints of abscess.  Patient report for the past 4 days she has noticed increasing pain and swelling to her gluteal region at the site of her prior pilonidal cyst.  Pain is sharp throbbing worse when she touches it.  No fever no abdominal pain no rectal pain no trouble having bowel movement.  She denies any recent trauma.  She is scheduled to be seen and evaluated by [facility] surgery in 2 days but she was instructed to come to ER if her pain got worse.  Denies any fever or chills.  Home Medications Prior to Admission medications   Medication Sig Start Date End Date Taking? Authorizing Provider  clotrimazole (GYNE-LOTRIMIN) 1 % vaginal cream Apply to external vaginal area 3-4 times daily as needed for vulvovaginitis. 05/12/23   Theadora Rama Scales, PA-C  fluconazole (DIFLUCAN) 150 MG tablet Take first tablet on day 1.  Take second tablet 3 days after first tablet.  Take third tablet 3 days after second tablet. 05/12/23   Theadora Rama Scales, PA-C  lidocaine (XYLOCAINE) 5 % ointment Apply 1 Application topically 4 (four) times daily as needed. 05/12/23   Theadora Rama Scales, PA-C  oxyCODONE (ROXICODONE) 5 MG immediate release tablet Take 1 tablet (5 mg total) by mouth every 4 (four) hours as needed for up to 12 doses for severe pain. 05/17/23   Rondel Baton, MD  oxyCODONE-acetaminophen (PERCOCET/ROXICET) 5-325 MG tablet Take 1 tablet by mouth every 6 (six) hours as needed for severe pain. 05/22/23   Gwyneth Sprout, MD      Allergies    Vicodin  [hydrocodone-acetaminophen]    Review of Systems   Review of Systems  All other systems reviewed and are negative.   Physical Exam Updated Vital Signs BP (!) 158/120 (BP Location: Right Arm)   Pulse 76   Temp 97.8 F (36.6 C) (Oral)   Resp 18   Ht 5\' 8"  (1.727 m)   Wt 91.6 kg   SpO2 100%   BMI 30.71 kg/m  Physical Exam Vitals and nursing note reviewed.  Constitutional:      General: She is not in acute distress.    Appearance: She is well-developed.  HENT:     Head: Atraumatic.  Eyes:     Conjunctiva/sclera: Conjunctivae normal.  Pulmonary:     Effort: Pulmonary effort is normal.  Abdominal:     Palpations: Abdomen is soft.     Tenderness: There is no abdominal tenderness.  Genitourinary:    Comments: Chaperone present during exam at the gluteal cleft there is an area of induration and fluctuant approximately 2 cm in diameter without any surrounding skin erythema.  It is tender to palpation.  No rectal involvement. Musculoskeletal:     Cervical back: Neck supple.  Skin:    Findings: No rash.  Neurological:     Mental Status: She is alert.  Psychiatric:        Mood and Affect: Mood normal.     ED Results / Procedures / Treatments   Labs (all labs ordered  are listed, but only abnormal results are displayed) Labs Reviewed - No data to display  EKG None  Radiology No results found.  Procedures Procedures    Medications Ordered in ED Medications - No data to display  ED Course/ Medical Decision Making/ A&P                                 Medical Decision Making Amount and/or Complexity of Data Reviewed Labs: ordered.  Risk Prescription drug management.   BP 139/87   Pulse 73   Temp 97.8 F (36.6 C) (Oral)   Resp 18   Ht 5\' 8"  (1.727 m)   Wt 91.6 kg   SpO2 100%   BMI 30.71 kg/m   41:21 PM  37 year old female significant history of pilonidal cyst, anemia, depression, presenting with complaints of abscess.  Patient report for the past 4  days she has noticed increasing pain and swelling to her gluteal region at the site of her prior pilonidal cyst.  Pain is sharp throbbing worse when she touches it.  No fever no abdominal pain no rectal pain no trouble having bowel movement.  She denies any recent trauma.  She is scheduled to be seen and evaluated by [facility] surgery in 2 days but she was instructed to come to ER if her pain got worse.  Denies any fever or chills.  On exam this is a well-appearing female resting comfortably in bed appears to be in no acute discomfort.  Chaperone was present during exam.  Patient does have evidence of an early forming infected pilonidal cyst at the site of her previous I&D.  There are evidence of some scar tissue which makes the exam a bit more difficult.  At this time, I discussed the patient options of perform incision and drainage but she mention she does have an appointment with her surgeon in 2 days and would prefer to have surgical intervention at the office instead.  Therefore, I will prescribe antibiotic as well as not opiate pain medication.  Encourage patient to apply warm moist compress to the affected area to aid with healing.  Patient voiced understanding and agrees with plan.        Final Clinical Impression(s) / ED Diagnoses Final diagnoses:  Pilonidal abscess    Rx / DC Orders ED Discharge Orders          Ordered    sulfamethoxazole-trimethoprim (BACTRIM DS) 800-160 MG tablet  2 times daily        09/04/23 1336    ibuprofen (ADVIL) 600 MG tablet  Every 6 hours PRN        09/04/23 1336              Fayrene Helper, PA-C 09/04/23 1337    Arby Barrette, MD 09/04/23 1625

## 2023-09-04 NOTE — Discharge Instructions (Signed)
You have been diagnosed with an infected pilonidal cyst.  Please take antibiotic as prescribed and call and follow-up closely with your general surgeon on Tuesday for surgical management of this condition.

## 2023-09-06 ENCOUNTER — Emergency Department (HOSPITAL_COMMUNITY)
Admission: EM | Admit: 2023-09-06 | Discharge: 2023-09-06 | Disposition: A | Discharge status: Home / Self Care | Payer: Medicaid Other | Attending: Emergency Medicine | Admitting: Emergency Medicine

## 2023-09-06 ENCOUNTER — Encounter (HOSPITAL_COMMUNITY): Payer: Self-pay

## 2023-09-06 ENCOUNTER — Emergency Department (HOSPITAL_COMMUNITY): Payer: Medicaid Other

## 2023-09-06 ENCOUNTER — Other Ambulatory Visit: Payer: Self-pay

## 2023-09-06 DIAGNOSIS — L0591 Pilonidal cyst without abscess: Secondary | ICD-10-CM | POA: Diagnosis present

## 2023-09-06 DIAGNOSIS — R1031 Right lower quadrant pain: Secondary | ICD-10-CM | POA: Insufficient documentation

## 2023-09-06 DIAGNOSIS — R109 Unspecified abdominal pain: Secondary | ICD-10-CM

## 2023-09-06 LAB — CBC WITH DIFFERENTIAL/PLATELET
Abs Immature Granulocytes: 0.03 10*3/uL (ref 0.00–0.07)
Basophils Absolute: 0 10*3/uL (ref 0.0–0.1)
Basophils Relative: 1 %
Eosinophils Absolute: 0.1 10*3/uL (ref 0.0–0.5)
Eosinophils Relative: 2 %
HCT: 30 % — ABNORMAL LOW (ref 36.0–46.0)
Hemoglobin: 8.1 g/dL — ABNORMAL LOW (ref 12.0–15.0)
Immature Granulocytes: 0 %
Lymphocytes Relative: 19 %
Lymphs Abs: 1.4 10*3/uL (ref 0.7–4.0)
MCH: 16.1 pg — ABNORMAL LOW (ref 26.0–34.0)
MCHC: 27 g/dL — ABNORMAL LOW (ref 30.0–36.0)
MCV: 59.6 fL — ABNORMAL LOW (ref 80.0–100.0)
Monocytes Absolute: 0.4 10*3/uL (ref 0.1–1.0)
Monocytes Relative: 5 %
Neutro Abs: 5.3 10*3/uL (ref 1.7–7.7)
Neutrophils Relative %: 73 %
Platelets: 226 10*3/uL (ref 150–400)
RBC: 5.03 MIL/uL (ref 3.87–5.11)
RDW: 22 % — ABNORMAL HIGH (ref 11.5–15.5)
WBC: 7.2 10*3/uL (ref 4.0–10.5)
nRBC: 0 % (ref 0.0–0.2)

## 2023-09-06 LAB — URINALYSIS, ROUTINE W REFLEX MICROSCOPIC
Bilirubin Urine: NEGATIVE
Glucose, UA: NEGATIVE mg/dL
Hgb urine dipstick: NEGATIVE
Ketones, ur: NEGATIVE mg/dL
Leukocytes,Ua: NEGATIVE
Nitrite: NEGATIVE
Protein, ur: 30 mg/dL — AB
Specific Gravity, Urine: 1.029 (ref 1.005–1.030)
pH: 5 (ref 5.0–8.0)

## 2023-09-06 LAB — COMPREHENSIVE METABOLIC PANEL
ALT: 10 U/L (ref 0–44)
AST: 13 U/L — ABNORMAL LOW (ref 15–41)
Albumin: 3.9 g/dL (ref 3.5–5.0)
Alkaline Phosphatase: 40 U/L (ref 38–126)
Anion gap: 6 (ref 5–15)
BUN: 9 mg/dL (ref 6–20)
CO2: 24 mmol/L (ref 22–32)
Calcium: 8.6 mg/dL — ABNORMAL LOW (ref 8.9–10.3)
Chloride: 105 mmol/L (ref 98–111)
Creatinine, Ser: 0.69 mg/dL (ref 0.44–1.00)
GFR, Estimated: >60 mL/min (ref >60)
Glucose, Bld: 93 mg/dL (ref 70–99)
Potassium: 3.5 mmol/L (ref 3.5–5.1)
Sodium: 135 mmol/L (ref 135–145)
Total Bilirubin: 0.2 mg/dL — ABNORMAL LOW (ref 0.3–1.2)
Total Protein: 8.4 g/dL — ABNORMAL HIGH (ref 6.5–8.1)

## 2023-09-06 LAB — PREGNANCY, URINE: Preg Test, Ur: NEGATIVE

## 2023-09-06 MED ORDER — SODIUM CHLORIDE 0.9 % IV BOLUS
1000.0000 mL | Freq: Once | INTRAVENOUS | Status: AC
  Administered 2023-09-06: 1000 mL via INTRAVENOUS

## 2023-09-06 MED ORDER — MORPHINE SULFATE (PF) 4 MG/ML IV SOLN
4.0000 mg | Freq: Once | INTRAVENOUS | Status: AC
  Administered 2023-09-06: 4 mg via INTRAVENOUS
  Filled 2023-09-06: qty 1

## 2023-09-06 NOTE — Discharge Instructions (Signed)
You were seen in the emergency department for abdominal pain The ultrasound taken shows a fibroid uterus but no other concerning findings with your ovaries It is importantly go to the appointment to see your surgeon to drain the pilonidal cyst on your back Your lab work today showed anemia again and you may wish to discuss iron supplementation with your primary doctor Please follow-up with your primary care doctor in 1 week for reevaluation Return to the Emergency Department for severe pain or any other concerning symptoms

## 2023-09-06 NOTE — ED Notes (Signed)
Pt requests to be checked for BV, Dr. Elayne Snare notified

## 2023-09-06 NOTE — ED Notes (Signed)
US at bedside

## 2023-09-06 NOTE — ED Provider Notes (Signed)
[place] EMERGENCY DEPARTMENT AT [facility] Provider Note   CSN: [id number] Arrival date & time: 09/06/23  5366     History  Chief Complaint  Patient presents with   Hip Pain    Beth Gibson is a 37 y.o. female.  Who returns to the emergency department for abdominal pain.  She was seen yesterday in the emergency department for pain related to a pilonidal cyst which is scheduled for outpatient surgery tomorrow.  She was given antibiotics at the time of discharge and has been taking them.  She is still having pain with a pilonidal cyst.  Her primary complaint is abdominal pain related to a "bump" she felt in the right lower quadrant 4 days ago that has been progressing since the onset.  No nausea vomiting change in bowel habits changes in urination or vaginal bleeding.  Last menstrual period was 1 month ago.   Hip Pain Associated symptoms include abdominal pain.       Home Medications Prior to Admission medications   Medication Sig Start Date End Date Taking? Authorizing Provider  clotrimazole (GYNE-LOTRIMIN) 1 % vaginal cream Apply to external vaginal area 3-4 times daily as needed for vulvovaginitis. 05/12/23   Theadora Rama Scales, PA-C  fluconazole (DIFLUCAN) 150 MG tablet Take first tablet on day 1.  Take second tablet 3 days after first tablet.  Take third tablet 3 days after second tablet. 05/12/23   Theadora Rama Scales, PA-C  ibuprofen (ADVIL) 600 MG tablet Take 1 tablet (600 mg total) by mouth every 6 (six) hours as needed. 09/04/23   Fayrene Helper, PA-C  lidocaine (XYLOCAINE) 5 % ointment Apply 1 Application topically 4 (four) times daily as needed. 05/12/23   Theadora Rama Scales, PA-C  oxyCODONE (ROXICODONE) 5 MG immediate release tablet Take 1 tablet (5 mg total) by mouth every 4 (four) hours as needed for up to 12 doses for severe pain. 05/17/23   Rondel Baton, MD  oxyCODONE-acetaminophen (PERCOCET/ROXICET) 5-325 MG tablet Take 1 tablet by mouth every  6 (six) hours as needed for severe pain. 05/22/23   Gwyneth Sprout, MD  sulfamethoxazole-trimethoprim (BACTRIM DS) 800-160 MG tablet Take 1 tablet by mouth 2 (two) times daily for 7 days. 09/04/23 09/11/23  Fayrene Helper, PA-C      Allergies    Vicodin [hydrocodone-acetaminophen]    Review of Systems   Review of Systems  Gastrointestinal:  Positive for abdominal pain.  All other systems reviewed and are negative.   Physical Exam Updated Vital Signs BP (!) 146/87   Pulse 62   Temp 98.2 F (36.8 C) (Oral)   Resp 18   Ht 5\' 8"  (1.727 m)   Wt 91.6 kg   LMP 08/17/2023   SpO2 100%   BMI 30.71 kg/m  Physical Exam Vitals and nursing note reviewed.  HENT:     Head: Normocephalic and atraumatic.  Eyes:     Pupils: Pupils are equal, round, and reactive to light.  Cardiovascular:     Rate and Rhythm: Normal rate and regular rhythm.  Pulmonary:     Effort: Pulmonary effort is normal.     Breath sounds: Normal breath sounds.  Abdominal:     Palpations: Abdomen is soft.     Comments: Palpable firm mass in right lower quadrant tender to palpation No rebound rigidity or guarding  Skin:    General: Skin is warm and dry.  Neurological:     Mental Status: She is alert.  Psychiatric:  Mood and Affect: Mood normal.     ED Results / Procedures / Treatments   Labs (all labs ordered are listed, but only abnormal results are displayed) Labs Reviewed  CBC WITH DIFFERENTIAL/PLATELET - Abnormal; Notable for the following components:      Result Value   Hemoglobin 8.1 (*)    HCT 30.0 (*)    MCV 59.6 (*)    MCH 16.1 (*)    MCHC 27.0 (*)    RDW 22.0 (*)    All other components within normal limits  COMPREHENSIVE METABOLIC PANEL - Abnormal; Notable for the following components:   Calcium 8.6 (*)    Total Protein 8.4 (*)    AST 13 (*)    Total Bilirubin 0.2 (*)    All other components within normal limits  URINALYSIS, ROUTINE W REFLEX MICROSCOPIC - Abnormal; Notable for the  following components:   APPearance HAZY (*)    Protein, ur 30 (*)    Bacteria, UA RARE (*)    All other components within normal limits  PREGNANCY, URINE    EKG None  Radiology US PELVIC COMPLETE W TRANSVAGINAL AND TORSION R/O  Result Date: 09/06/2023 CLINICAL DATA:  Ovarian torsion EXAM: TRANSABDOMINAL AND TRANSVAGINAL ULTRASOUND OF PELVIS TECHNIQUE: Both transabdominal and transvaginal ultrasound examinations of the pelvis were performed. Transabdominal technique was performed for global imaging of the pelvis including uterus, ovaries, adnexal regions, and pelvic cul-de-sac. It was necessary to proceed with endovaginal exam following the transabdominal exam to visualize the adnexal structures. COMPARISON:  CT C AP 12/04/2022 FINDINGS: Uterus Measurements: 10.0 x 5.3 x 5.7 cm = volume: 156.6 mL. There is a 3.0 x 2.7 x 2.4 cm fibroid within the left posterior uterine body. Endometrium Thickness: 17.4 mm.  No focal abnormality visualized. Right ovary Measurements: 3.6 x 1.7 x 3.2 cm = volume: 9.9 mL. Normal appearance/no adnexal mass. Left ovary Measurements: 3.4 x 2.3 x 2.4 cm = volume: 9.6 mL. Normal appearance/no adnexal mass. Other findings No abnormal free fluid. IMPRESSION: 1. No acute process within the pelvis. 2. Fibroid uterus Electronically Signed   By: Annia Belt M.D.   On: 09/06/2023 14:00    Procedures Procedures    Medications Ordered in ED Medications  morphine (PF) 4 MG/ML injection 4 mg (4 mg Intravenous Given 09/06/23 1241)  sodium chloride 0.9 % bolus 1,000 mL (1,000 mLs Intravenous New Bag/Given 09/06/23 1241)    ED Course/ Medical Decision Making/ A&P Clinical Course as of 09/06/23 1436  Mon Sep 06, 2023  1432 Laboratory workup reveals microcytic anemia which she has had in the past.  No other acute abnormality.  No evidence of urinary tract infection.  Pregnancy test negative.  Pelvic ultrasound shows fibroid uterus but no other evidence of ovarian torsion or other  acute pathology that would explain her symptoms.  Informed patient of these results.  She will follow-up with her surgeon tomorrow and PCP within the next week.  She is requesting discharge at this time to pick up her children from school [MP]    Clinical Course User Index [MP] Royanne Foots, DO                                 Medical Decision Making 37 year old female with history as above return to the emergency department.  She was seen yesterday for pain related to pilonidal cyst.  Today she reports right lower quadrant abdominal pain related  to a bump she first noticed 3 days ago.  On exam there is a palpable mass which may represent a hernia, ovarian cyst or other abdominal defect.  Will obtain laboratory workup including UA, hCG and ultrasound to evaluate for further etiology of this defect  Amount and/or Complexity of Data Reviewed Labs: ordered. Radiology: ordered.  Risk Prescription drug management.           Final Clinical Impression(s) / ED Diagnoses Final diagnoses:  Abdominal pain, unspecified abdominal location  Pilonidal cyst    Rx / DC Orders ED Discharge Orders     None         Royanne Foots, DO 09/06/23 1436

## 2023-09-06 NOTE — ED Triage Notes (Signed)
Pt complaining of a lump the right side of her hip that began Wednesday. Pt endorses increased swelling, pain, and redness in that area. Increased pain with movement. Pt also has a cyst on her tailbone that she has been seen for recently. Pt has surgery scheduled for tomorrow, but states "I can't wait".

## 2023-09-14 ENCOUNTER — Other Ambulatory Visit: Payer: Self-pay

## 2023-09-14 ENCOUNTER — Encounter (HOSPITAL_COMMUNITY): Payer: Self-pay

## 2023-09-14 ENCOUNTER — Emergency Department (HOSPITAL_COMMUNITY)
Admission: EM | Admit: 2023-09-14 | Discharge: 2023-09-14 | Discharge status: Against Medical Advice | Payer: Medicaid Other | Attending: Emergency Medicine | Admitting: Emergency Medicine

## 2023-09-14 DIAGNOSIS — Z5321 Procedure and treatment not carried out due to patient leaving prior to being seen by health care provider: Secondary | ICD-10-CM | POA: Insufficient documentation

## 2023-09-14 DIAGNOSIS — G8918 Other acute postprocedural pain: Secondary | ICD-10-CM | POA: Insufficient documentation

## 2023-09-14 NOTE — ED Notes (Signed)
Patient left property due to not being told wait time

## 2023-09-14 NOTE — ED Triage Notes (Signed)
Pt went to [place] surgery on Thurs for cyst on tailbone. Pt states she hasn't changed wound packing since surgery, because she doesn't have anyone to help her with it. Pt c/o pain in that area. The wound does not appear to be infected.

## 2023-11-29 ENCOUNTER — Encounter (HOSPITAL_COMMUNITY): Payer: Self-pay

## 2023-11-29 ENCOUNTER — Ambulatory Visit (HOSPITAL_COMMUNITY)
Admission: EM | Admit: 2023-11-29 | Discharge: 2023-11-29 | Disposition: A | Discharge status: Home / Self Care | Payer: MEDICAID | Attending: Family Medicine | Admitting: Family Medicine

## 2023-11-29 DIAGNOSIS — K0889 Other specified disorders of teeth and supporting structures: Secondary | ICD-10-CM | POA: Diagnosis not present

## 2023-11-29 MED ORDER — AMOXICILLIN 875 MG PO TABS: 875.0000 mg | ORAL_TABLET | Freq: Two times a day (BID) | ORAL | 0 refills | Status: AC

## 2023-11-29 MED ORDER — IBUPROFEN 800 MG PO TABS: 800.0000 mg | ORAL_TABLET | Freq: Three times a day (TID) | ORAL | 0 refills | Status: DC

## 2023-11-29 NOTE — ED Provider Notes (Signed)
[facility]    CSN: [id number] Arrival date & time: 11/29/23  1132      History   Chief Complaint Chief Complaint  Patient presents with   Dental Pain    HPI Beth Gibson is a 37 y.o. female.    Dental Pain  Patient is here for dental pain x 2 days.  Lower left teeth.  This morning she woke up and the area is swollen.  She does have a dentist, and did attempt to call them.  She did take aleve for pain.  No fevers/chills.      Past Medical History:  Diagnosis Date   Abscess of axilla, right    Abscess of right axilla 06/20/2018   Anemia    Asthma    NO MEDS, SOB with activity   BV (bacterial vaginosis)    Chlamydia 2020   Depression    Gonorrhea 2020   Headache(784.0)    MIGRAINES   History of blood transfusion 2013   with child birth   PONV (postoperative nausea and vomiting)    nausea   Preterm labor    Previous cesarean delivery, antepartum condition or complication 12/10/2011   Classical at 28 weeks, plan repeat at 36 weeks - classical per [facility]    Status post elective abortion 12/10/2016   Trichomonas    UTI (lower urinary tract infection)    Yeast vaginitis     Patient Active Problem List   Diagnosis Date Noted   Axillary abscess 04/03/2021   Chronic cystitis 05/06/2020   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   DUB (dysfunctional uterine bleeding) 03/25/2020   Severe episode of recurrent major depressive disorder, with psychotic features (HCC)    MDD (major depressive disorder) 03/16/2016   Anemia due to chronic blood loss 11/01/2014   Obesity (BMI 30-39.9) 12/10/2011    Past Surgical History:  Procedure Laterality Date   CESAREAN SECTION     CESAREAN SECTION  04/18/2012   Procedure: CESAREAN SECTION;  Surgeon: Lesly Dukes, MD;  Location: [facility];  Service: Gynecology;  Laterality: N/A;  repeat   cyst removed from L buttock     CYSTECTOMY  2009   left buttock   DILATION AND EVACUATION N/A 02/24/2017   Procedure:  DILATATION AND EVACUATION;  Surgeon: Willodean Rosenthal, MD;  Location: [facility];  Service: Gynecology;  Laterality: N/A;   HYDRADENITIS EXCISION Right 11/04/2018   Procedure: EXCISION HIDRADENITIS RIGHT  AXILLA;  Surgeon: Griselda Miner, MD;  Location: [facility];  Service: General;  Laterality: Right;   INCISION AND DRAINAGE ABSCESS Right 11/01/2014   Procedure: INCISION AND DRAINAGE RIGHT AXILLARY ABSCESS;  Surgeon: Romie Levee, MD;  Location: [facility];  Service: General;  Laterality: Right;   INCISION AND DRAINAGE ABSCESS Right 06/21/2018   Procedure: INCISION AND DRAINAGE RIGHT ABSCESS AXILLA;  Surgeon: Griselda Miner, MD;  Location: [facility];  Service: General;  Laterality: Right;   INCISION AND DRAINAGE ABSCESS Right 04/03/2021   Procedure: INCISION AND DRAINAGE RIGHT AXILLA ABSCESS;  Surgeon: Manus Rudd, MD;  Location: [facility];  Service: General;  Laterality: Right;   IRRIGATION AND DEBRIDEMENT ABSCESS Right 08/09/2015   Procedure: IRRIGATION AND DEBRIDEMENT AXILLARY ABSCESS;  Surgeon: Ovidio Kin, MD;  Location: [facility];  Service: General;  Laterality: Right;   WISDOM TOOTH EXTRACTION     WISDOM TOOTH EXTRACTION  2009    OB History     Gravida  5   Para  2  Term  0   Preterm  2   AB  3   Living  2      SAB  1   IAB  0   Ectopic  0   Multiple  0   Live Births  2            Home Medications    Prior to Admission medications   Medication Sig Start Date End Date Taking? Authorizing Provider  clotrimazole (GYNE-LOTRIMIN) 1 % vaginal cream Apply to external vaginal area 3-4 times daily as needed for vulvovaginitis. 05/12/23   Theadora Rama Scales, PA-C  fluconazole (DIFLUCAN) 150 MG tablet Take first tablet on day 1.  Take second tablet 3 days after first tablet.  Take third tablet 3 days after second tablet. 05/12/23   Theadora Rama Scales, PA-C  ibuprofen (ADVIL) 600 MG tablet Take 1 tablet (600 mg total) by mouth every 6 (six) hours as  needed. 09/04/23   Fayrene Helper, PA-C  lidocaine (XYLOCAINE) 5 % ointment Apply 1 Application topically 4 (four) times daily as needed. 05/12/23   Theadora Rama Scales, PA-C  oxyCODONE (ROXICODONE) 5 MG immediate release tablet Take 1 tablet (5 mg total) by mouth every 4 (four) hours as needed for up to 12 doses for severe pain. 05/17/23   Rondel Baton, MD  oxyCODONE-acetaminophen (PERCOCET/ROXICET) 5-325 MG tablet Take 1 tablet by mouth every 6 (six) hours as needed for severe pain. 05/22/23   Gwyneth Sprout, MD    Family History Family History  Problem Relation Age of Onset   Hypertension Mother    Diabetes Mellitus I Father    Chronic Renal Failure Father     Social History Social History   Tobacco Use   Smoking status: Some Days    Current packs/day: 0.25    Average packs/day: 0.3 packs/day for 0.5 years (0.1 ttl pk-yrs)    Types: Cigarettes   Smokeless tobacco: Never  Vaping Use   Vaping status: Never Used  Substance Use Topics   Alcohol use: Not Currently    Alcohol/week: 1.0 standard drink of alcohol    Types: 1 Standard drinks or equivalent per week    Comment: occasional   Drug use: Not Currently    Types: Marijuana    Comment: Last use-08/2019     Allergies   Hydrocodone-acetaminophen and Vicodin [hydrocodone-acetaminophen]   Review of Systems Review of Systems  Constitutional: Negative.   HENT:  Positive for dental problem.   Respiratory: Negative.    Cardiovascular: Negative.   Gastrointestinal: Negative.   Musculoskeletal: Negative.   Psychiatric/Behavioral: Negative.       Physical Exam Triage Vital Signs ED Triage Vitals  Encounter Vitals Group     BP 11/29/23 1239 (!) 161/86     Systolic BP Percentile --      Diastolic BP Percentile --      Pulse Rate 11/29/23 1239 (!) 107     Resp 11/29/23 1239 18     Temp 11/29/23 1239 98.4 F (36.9 C)     Temp Source 11/29/23 1239 Oral     SpO2 11/29/23 1239 98 %     Weight --      Height --       Head Circumference --      Peak Flow --      Pain Score 11/29/23 1240 6     Pain Loc --      Pain Education --      Exclude from Growth Chart --  No data found.  Updated Vital Signs BP (!) 161/86 (BP Location: Left Arm)   Pulse (!) 107   Temp 98.4 F (36.9 C) (Oral)   Resp 18   LMP 11/29/2023 (Approximate)   SpO2 98%   Visual Acuity Right Eye Distance:   Left Eye Distance:   Bilateral Distance:    Right Eye Near:   Left Eye Near:    Bilateral Near:     Physical Exam Constitutional:      Appearance: Normal appearance.  HENT:     Head:     Comments: There is swelling and tenderness to the left lower jaw line;  broken molar at the left lower teeth;  tender Cardiovascular:     Rate and Rhythm: Normal rate and regular rhythm.  Pulmonary:     Effort: Pulmonary effort is normal.     Breath sounds: Normal breath sounds.  Musculoskeletal:     Cervical back: Neck supple. Tenderness present.  Neurological:     Mental Status: She is alert.  Psychiatric:        Mood and Affect: Mood normal.      UC Treatments / Results  Labs (all labs ordered are listed, but only abnormal results are displayed) Labs Reviewed - No data to display  EKG   Radiology No results found.  Procedures Procedures (including critical care time)  Medications Ordered in UC Medications - No data to display  Initial Impression / Assessment and Plan / UC Course  I have reviewed the triage vital signs and the nursing notes.  Pertinent labs & imaging results that were available during my care of the patient were reviewed by me and considered in my medical decision making (see chart for details).  Final Clinical Impressions(s) / UC Diagnoses   Final diagnoses:  Pain, dental     Discharge Instructions      You were seen today for a dental infection. I have sent out an antibiotic and motrin.  Please make an appointment with your dentist for further care.     ED Prescriptions      Medication Sig Dispense Auth. Provider   amoxicillin (AMOXIL) 875 MG tablet Take 1 tablet (875 mg total) by mouth 2 (two) times daily for 10 days. 20 tablet Mata Rowen, MD   ibuprofen (ADVIL) 800 MG tablet Take 1 tablet (800 mg total) by mouth 3 (three) times daily. 21 tablet Jannifer Franklin, MD      PDMP not reviewed this encounter.   Jannifer Franklin, MD 11/29/23 1250

## 2023-11-29 NOTE — Discharge Instructions (Signed)
You were seen today for a dental infection. I have sent out an antibiotic and motrin.  Please make an appointment with your dentist for further care.

## 2023-11-29 NOTE — ED Triage Notes (Signed)
Pt is here for dental pain and swelling.

## 2024-02-01 ENCOUNTER — Encounter: Payer: Self-pay | Admitting: Emergency Medicine

## 2024-02-01 ENCOUNTER — Ambulatory Visit
Admission: EM | Admit: 2024-02-01 | Discharge: 2024-02-01 | Disposition: A | Discharge status: Home / Self Care | Payer: MEDICAID | Attending: Physician Assistant | Admitting: Physician Assistant

## 2024-02-01 ENCOUNTER — Other Ambulatory Visit: Payer: Self-pay

## 2024-02-01 DIAGNOSIS — N898 Other specified noninflammatory disorders of vagina: Secondary | ICD-10-CM | POA: Insufficient documentation

## 2024-02-01 NOTE — ED Provider Notes (Signed)
[facility]    CSN: [id number] Arrival date & time: 02/01/24  0809      History   Chief Complaint Chief Complaint  Patient presents with   Vaginal Discharge    HPI Beth Gibson is a 38 y.o. female.   Patient here today for evaluation of vaginal discharge she has had the last 2 weeks. She denies any urinary symptoms. She has had some lower abdominal pain. She denies any fever, nausea or vomiting. She has not had any genital rash or lesions. She denies any known STD exposures.   The history is provided by the patient.  Vaginal Discharge Associated symptoms: abdominal pain   Associated symptoms: no dysuria, no fever, no nausea and no vomiting     Past Medical History:  Diagnosis Date   Abscess of axilla, right    Abscess of right axilla 06/20/2018   Anemia    Asthma    NO MEDS, SOB with activity   BV (bacterial vaginosis)    Chlamydia 2020   Depression    Gonorrhea 2020   Headache(784.0)    MIGRAINES   History of blood transfusion 2013   with child birth   PONV (postoperative nausea and vomiting)    nausea   Preterm labor    Previous cesarean delivery, antepartum condition or complication 12/10/2011   Classical at 28 weeks, plan repeat at 36 weeks - classical per [facility]    Status post elective abortion 12/10/2016   Trichomonas    UTI (lower urinary tract infection)    Yeast vaginitis     Patient Active Problem List   Diagnosis Date Noted   Axillary abscess 04/03/2021   Chronic cystitis 05/06/2020   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   DUB (dysfunctional uterine bleeding) 03/25/2020   Severe episode of recurrent major depressive disorder, with psychotic features (HCC)    MDD (major depressive disorder) 03/16/2016   Anemia due to chronic blood loss 11/01/2014   Obesity (BMI 30-39.9) 12/10/2011    Past Surgical History:  Procedure Laterality Date   CESAREAN SECTION     CESAREAN SECTION  04/18/2012   Procedure: CESAREAN SECTION;  Surgeon:  Lesly Dukes, MD;  Location: [facility];  Service: Gynecology;  Laterality: N/A;  repeat   cyst removed from L buttock     CYSTECTOMY  2009   left buttock   DILATION AND EVACUATION N/A 02/24/2017   Procedure: DILATATION AND EVACUATION;  Surgeon: Willodean Rosenthal, MD;  Location: [facility];  Service: Gynecology;  Laterality: N/A;   HYDRADENITIS EXCISION Right 11/04/2018   Procedure: EXCISION HIDRADENITIS RIGHT  AXILLA;  Surgeon: Griselda Miner, MD;  Location: [facility];  Service: General;  Laterality: Right;   INCISION AND DRAINAGE ABSCESS Right 11/01/2014   Procedure: INCISION AND DRAINAGE RIGHT AXILLARY ABSCESS;  Surgeon: Romie Levee, MD;  Location: [facility];  Service: General;  Laterality: Right;   INCISION AND DRAINAGE ABSCESS Right 06/21/2018   Procedure: INCISION AND DRAINAGE RIGHT ABSCESS AXILLA;  Surgeon: Griselda Miner, MD;  Location: [facility];  Service: General;  Laterality: Right;   INCISION AND DRAINAGE ABSCESS Right 04/03/2021   Procedure: INCISION AND DRAINAGE RIGHT AXILLA ABSCESS;  Surgeon: Manus Rudd, MD;  Location: [facility];  Service: General;  Laterality: Right;   IRRIGATION AND DEBRIDEMENT ABSCESS Right 08/09/2015   Procedure: IRRIGATION AND DEBRIDEMENT AXILLARY ABSCESS;  Surgeon: Ovidio Kin, MD;  Location: [facility];  Service: General;  Laterality: Right;   WISDOM TOOTH EXTRACTION  WISDOM TOOTH EXTRACTION  2009    OB History     Gravida  5   Para  2   Term  0   Preterm  2   AB  3   Living  2      SAB  1   IAB  0   Ectopic  0   Multiple  0   Live Births  2            Home Medications    Prior to Admission medications   Medication Sig Start Date End Date Taking? Authorizing Provider  ibuprofen (ADVIL) 800 MG tablet Take 1 tablet (800 mg total) by mouth 3 (three) times daily. 11/29/23   Jannifer Franklin, MD    Family History Family History  Problem Relation Age of Onset   Hypertension Mother    Diabetes Mellitus I Father     Chronic Renal Failure Father     Social History Social History   Tobacco Use   Smoking status: Some Days    Current packs/day: 0.25    Average packs/day: 0.3 packs/day for 0.5 years (0.1 ttl pk-yrs)    Types: Cigarettes   Smokeless tobacco: Never  Vaping Use   Vaping status: Never Used  Substance Use Topics   Alcohol use: Not Currently    Alcohol/week: 1.0 standard drink of alcohol    Types: 1 Standard drinks or equivalent per week    Comment: occasional   Drug use: Not Currently    Types: Marijuana    Comment: Last use-08/2019     Allergies   Hydrocodone-acetaminophen and Vicodin [hydrocodone-acetaminophen]   Review of Systems Review of Systems  Constitutional:  Negative for chills and fever.  Eyes:  Negative for discharge and redness.  Respiratory:  Negative for shortness of breath.   Gastrointestinal:  Positive for abdominal pain. Negative for nausea and vomiting.  Genitourinary:  Positive for vaginal discharge. Negative for dysuria and genital sores.     Physical Exam Triage Vital Signs ED Triage Vitals  Encounter Vitals Group     BP 02/01/24 0824 (!) 151/92     Systolic BP Percentile --      Diastolic BP Percentile --      Pulse Rate 02/01/24 0824 83     Resp 02/01/24 0824 18     Temp 02/01/24 0824 98.2 F (36.8 C)     Temp Source 02/01/24 0824 Oral     SpO2 02/01/24 0824 98 %     Weight --      Height --      Head Circumference --      Peak Flow --      Pain Score 02/01/24 0825 0     Pain Loc --      Pain Education --      Exclude from Growth Chart --    No data found.  Updated Vital Signs BP (!) 151/92 (BP Location: Left Arm)   Pulse 83   Temp 98.2 F (36.8 C) (Oral)   Resp 18   LMP 01/25/2024   SpO2 98%   Visual Acuity Right Eye Distance:   Left Eye Distance:   Bilateral Distance:    Right Eye Near:   Left Eye Near:    Bilateral Near:     Physical Exam Vitals and nursing note reviewed.  Constitutional:      General: She is  not in acute distress.    Appearance: Normal appearance. She is not ill-appearing.  HENT:  Head: Normocephalic and atraumatic.  Eyes:     Conjunctiva/sclera: Conjunctivae normal.  Cardiovascular:     Rate and Rhythm: Normal rate.  Pulmonary:     Effort: Pulmonary effort is normal. No respiratory distress.  Neurological:     Mental Status: She is alert.  Psychiatric:        Mood and Affect: Mood normal.        Behavior: Behavior normal.        Thought Content: Thought content normal.      UC Treatments / Results  Labs (all labs ordered are listed, but only abnormal results are displayed) Labs Reviewed  CERVICOVAGINAL ANCILLARY ONLY    EKG   Radiology No results found.  Procedures Procedures (including critical care time)  Medications Ordered in UC Medications - No data to display  Initial Impression / Assessment and Plan / UC Course  I have reviewed the triage vital signs and the nursing notes.  Pertinent labs & imaging results that were available during my care of the patient were reviewed by me and considered in my medical decision making (see chart for details).    STD screening ordered as well as screening for BV and yeast. Patient declines blood work today. Will await results for further recommendation.   Final Clinical Impressions(s) / UC Diagnoses   Final diagnoses:  Vaginal discharge   Discharge Instructions   None    ED Prescriptions   None    PDMP not reviewed this encounter.   Tomi Bamberger, PA-C 02/01/24 [phone]

## 2024-02-01 NOTE — ED Triage Notes (Signed)
Pt here for vaginal discharge x 2 weeks; denies urinary sx

## 2024-02-02 LAB — CERVICOVAGINAL ANCILLARY ONLY
Bacterial Vaginitis (gardnerella): POSITIVE — AB
Candida Glabrata: NEGATIVE
Candida Vaginitis: POSITIVE — AB
Chlamydia: NEGATIVE
Comment: NEGATIVE
Comment: NEGATIVE
Comment: NEGATIVE
Comment: NEGATIVE
Comment: NEGATIVE
Comment: NORMAL
Neisseria Gonorrhea: NEGATIVE
Trichomonas: NEGATIVE

## 2024-02-03 ENCOUNTER — Telehealth: Payer: Self-pay

## 2024-02-03 MED ORDER — FLUCONAZOLE 150 MG PO TABS: 150.0000 mg | ORAL_TABLET | Freq: Once | ORAL | 0 refills | Status: AC

## 2024-02-03 MED ORDER — METRONIDAZOLE 500 MG PO TABS: 500.0000 mg | ORAL_TABLET | Freq: Two times a day (BID) | ORAL | 0 refills | Status: AC

## 2024-02-03 NOTE — Telephone Encounter (Signed)
Per protocol, pt requires tx with metronidazole and Diflucan.  Rx sent to pharmacy on file.

## 2024-03-10 ENCOUNTER — Ambulatory Visit
Admission: EM | Admit: 2024-03-10 | Discharge: 2024-03-10 | Disposition: A | Discharge status: Home / Self Care | Payer: MEDICAID

## 2024-03-10 DIAGNOSIS — J111 Influenza due to unidentified influenza virus with other respiratory manifestations: Secondary | ICD-10-CM | POA: Diagnosis not present

## 2024-03-10 LAB — POC COVID19/FLU A&B COMBO
Covid Antigen, POC: NEGATIVE
Influenza A Antigen, POC: NEGATIVE
Influenza B Antigen, POC: NEGATIVE

## 2024-03-10 MED ORDER — OSELTAMIVIR PHOSPHATE 75 MG PO CAPS: 75.0000 mg | ORAL_CAPSULE | Freq: Two times a day (BID) | ORAL | 0 refills | Status: DC

## 2024-03-10 MED ORDER — IBUPROFEN 800 MG PO TABS
800.0000 mg | ORAL_TABLET | Freq: Once | ORAL | Status: AC
  Administered 2024-03-10: 800 mg via ORAL

## 2024-03-10 NOTE — Discharge Instructions (Signed)
 I am suspicious that you have the flu.  I have prescribed you Tamiflu.  Ensure adequate fluids and rest.

## 2024-03-10 NOTE — ED Provider Notes (Signed)
 [facility]    CSN: [id number] Arrival date & time: 03/10/24  0807      History   Chief Complaint Chief Complaint  Patient presents with   Influenza    HPI Beth Gibson is a 38 y.o. female.   Patient presents with nasal congestion, cough, generalized bodyaches that started in the middle of the night last night.  Reports her son tested positive for influenza yesterday.  She is not sure if she has had a fever at home.  She took NyQuil last night.  Denies history of asthma.   Influenza   Past Medical History:  Diagnosis Date   Abscess of axilla, right    Abscess of right axilla 06/20/2018   Anemia    Asthma    NO MEDS, SOB with activity   BV (bacterial vaginosis)    Chlamydia 2020   Depression    Gonorrhea 2020   Headache(784.0)    MIGRAINES   History of blood transfusion 2013   with child birth   PONV (postoperative nausea and vomiting)    nausea   Preterm labor    Previous cesarean delivery, antepartum condition or complication 12/10/2011   Classical at 28 weeks, plan repeat at 36 weeks - classical per [facility]    Status post elective abortion 12/10/2016   Trichomonas    UTI (lower urinary tract infection)    Yeast vaginitis     Patient Active Problem List   Diagnosis Date Noted   Axillary abscess 04/03/2021   Chronic cystitis 05/06/2020   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   DUB (dysfunctional uterine bleeding) 03/25/2020   Severe episode of recurrent major depressive disorder, with psychotic features (HCC)    MDD (major depressive disorder) 03/16/2016   Anemia due to chronic blood loss 11/01/2014   Obesity (BMI 30-39.9) 12/10/2011    Past Surgical History:  Procedure Laterality Date   CESAREAN SECTION     CESAREAN SECTION  04/18/2012   Procedure: CESAREAN SECTION;  Surgeon: Lesly Dukes, MD;  Location: [facility];  Service: Gynecology;  Laterality: N/A;  repeat   cyst removed from L buttock     CYSTECTOMY  2009   left buttock    DILATION AND EVACUATION N/A 02/24/2017   Procedure: DILATATION AND EVACUATION;  Surgeon: Willodean Rosenthal, MD;  Location: [facility];  Service: Gynecology;  Laterality: N/A;   HYDRADENITIS EXCISION Right 11/04/2018   Procedure: EXCISION HIDRADENITIS RIGHT  AXILLA;  Surgeon: Griselda Miner, MD;  Location: [facility];  Service: General;  Laterality: Right;   INCISION AND DRAINAGE ABSCESS Right 11/01/2014   Procedure: INCISION AND DRAINAGE RIGHT AXILLARY ABSCESS;  Surgeon: Romie Levee, MD;  Location: [facility];  Service: General;  Laterality: Right;   INCISION AND DRAINAGE ABSCESS Right 06/21/2018   Procedure: INCISION AND DRAINAGE RIGHT ABSCESS AXILLA;  Surgeon: Griselda Miner, MD;  Location: [facility];  Service: General;  Laterality: Right;   INCISION AND DRAINAGE ABSCESS Right 04/03/2021   Procedure: INCISION AND DRAINAGE RIGHT AXILLA ABSCESS;  Surgeon: Manus Rudd, MD;  Location: [facility];  Service: General;  Laterality: Right;   IRRIGATION AND DEBRIDEMENT ABSCESS Right 08/09/2015   Procedure: IRRIGATION AND DEBRIDEMENT AXILLARY ABSCESS;  Surgeon: Ovidio Kin, MD;  Location: [facility];  Service: General;  Laterality: Right;   WISDOM TOOTH EXTRACTION     WISDOM TOOTH EXTRACTION  2009    OB History     Gravida  5   Para  2  Term  0   Preterm  2   AB  3   Living  2      SAB  1   IAB  0   Ectopic  0   Multiple  0   Live Births  2            Home Medications    Prior to Admission medications   Medication Sig Start Date End Date Taking? Authorizing Provider  fluconazole (DIFLUCAN) 150 MG tablet Take by mouth. 02/03/24  Yes [provider]  oseltamivir (TAMIFLU) 75 MG capsule Take 1 capsule (75 mg total) by mouth every 12 (twelve) hours. 03/10/24  Yes Elisabetta Mishra, Acie Fredrickson, FNP  OVER THE COUNTER MEDICATION NyQuil   Yes [provider]  oxyCODONE (OXY IR/ROXICODONE) 5 MG immediate release tablet Take by mouth. 09/29/21  Yes [provider]   ibuprofen (ADVIL) 800 MG tablet Take 1 tablet (800 mg total) by mouth 3 (three) times daily. 11/29/23   Jannifer Franklin, MD    Family History Family History  Problem Relation Age of Onset   Hypertension Mother    Diabetes Mellitus I Father    Chronic Renal Failure Father     Social History Social History   Tobacco Use   Smoking status: Some Days    Current packs/day: 0.25    Average packs/day: 0.3 packs/day for 0.5 years (0.1 ttl pk-yrs)    Types: Cigarettes   Smokeless tobacco: Never  Vaping Use   Vaping status: Never Used  Substance Use Topics   Alcohol use: Not Currently    Alcohol/week: 1.0 standard drink of alcohol    Types: 1 Standard drinks or equivalent per week    Comment: occasional   Drug use: Not Currently    Types: Marijuana    Comment: Last use-08/2019     Allergies   Hydrocodone-acetaminophen and Vicodin [hydrocodone-acetaminophen]   Review of Systems Review of Systems Per HPI  Physical Exam Triage Vital Signs ED Triage Vitals  Encounter Vitals Group     BP 03/10/24 0830 (!) 163/83     Systolic BP Percentile --      Diastolic BP Percentile --      Pulse Rate 03/10/24 0830 (!) 102     Resp 03/10/24 0830 20     Temp 03/10/24 0830 98.4 F (36.9 C)     Temp Source 03/10/24 0830 Oral     SpO2 03/10/24 0830 98 %     Weight --      Height --      Head Circumference --      Peak Flow --      Pain Score 03/10/24 0828 10     Pain Loc --      Pain Education --      Exclude from Growth Chart --    No data found.  Updated Vital Signs BP (!) 163/83 (BP Location: Left Arm)   Pulse (!) 102   Temp 98.4 F (36.9 C) (Oral)   Resp 20   LMP 02/14/2024 (Approximate)   SpO2 98%   Visual Acuity Right Eye Distance:   Left Eye Distance:   Bilateral Distance:    Right Eye Near:   Left Eye Near:    Bilateral Near:     Physical Exam Constitutional:      General: She is not in acute distress.    Appearance: Normal appearance. She is not  toxic-appearing or diaphoretic.  HENT:     Head: Normocephalic and  atraumatic.     Right Ear: Tympanic membrane and ear canal normal.     Left Ear: Tympanic membrane and ear canal normal.     Nose: Congestion present.     Mouth/Throat:     Mouth: Mucous membranes are moist.     Pharynx: No posterior oropharyngeal erythema.  Eyes:     Extraocular Movements: Extraocular movements intact.     Conjunctiva/sclera: Conjunctivae normal.     Pupils: Pupils are equal, round, and reactive to light.  Cardiovascular:     Rate and Rhythm: Normal rate and regular rhythm.     Pulses: Normal pulses.     Heart sounds: Normal heart sounds.  Pulmonary:     Effort: Pulmonary effort is normal. No respiratory distress.     Breath sounds: Normal breath sounds. No stridor. No wheezing, rhonchi or rales.  Musculoskeletal:        General: Normal range of motion.     Cervical back: Normal range of motion.  Skin:    General: Skin is warm and dry.  Neurological:     General: No focal deficit present.     Mental Status: She is alert and oriented to person, place, and time. Mental status is at baseline.  Psychiatric:        Mood and Affect: Mood normal.        Behavior: Behavior normal.      UC Treatments / Results  Labs (all labs ordered are listed, but only abnormal results are displayed) Labs Reviewed  POC COVID19/FLU A&B COMBO - Normal    EKG   Radiology No results found.  Procedures Procedures (including critical care time)  Medications Ordered in UC Medications  ibuprofen (ADVIL) tablet 800 mg (800 mg Oral Given 03/10/24 0910)    Initial Impression / Assessment and Plan / UC Course  I have reviewed the triage vital signs and the nursing notes.  Pertinent labs & imaging results that were available during my care of the patient were reviewed by me and considered in my medical decision making (see chart for details).     Rapid flu and COVID test were negative but I am still highly  suspicious of influenza given her son tested positive for the flu yesterday.  Therefore, will opt to treat with Tamiflu today.  Patient requesting medication for body aches in urgent care so ibuprofen was administered.  Advised adequate fluids, rest, symptom management.  Advised strict follow up precautions.  Patient verbalized understanding and was agreeable with plan.  Blood pressure elevated but patient appears very uncomfortable today so suspect this is contributing. No signs of hypertensive urgency on exam.  Final Clinical Impressions(s) / UC Diagnoses   Final diagnoses:  Influenza     Discharge Instructions      I am suspicious that you have the flu.  I have prescribed you Tamiflu.  Ensure adequate fluids and rest.    ED Prescriptions     Medication Sig Dispense Auth. Provider   oseltamivir (TAMIFLU) 75 MG capsule Take 1 capsule (75 mg total) by mouth every 12 (twelve) hours. 10 capsule Gustavus Bryant, [place]      PDMP not reviewed this encounter.   Gustavus Bryant, [place] 03/10/24 1024

## 2024-03-10 NOTE — ED Triage Notes (Signed)
 Pt presents with flu like symptoms that onset last night. Pt says her son was diagnosed with the flu yesterday and she believes she has it too.

## 2024-03-16 ENCOUNTER — Encounter: Payer: Self-pay | Admitting: Emergency Medicine

## 2024-03-16 ENCOUNTER — Ambulatory Visit
Admission: RE | Admit: 2024-03-16 | Discharge: 2024-03-16 | Discharge status: Home / Self Care | Payer: MEDICAID | Source: Ambulatory Visit

## 2024-03-16 ENCOUNTER — Ambulatory Visit
Admission: RE | Admit: 2024-03-16 | Discharge: 2024-03-16 | Disposition: A | Discharge status: Home / Self Care | Payer: MEDICAID | Source: Ambulatory Visit | Attending: Internal Medicine | Admitting: Internal Medicine

## 2024-03-16 DIAGNOSIS — R053 Chronic cough: Secondary | ICD-10-CM

## 2024-03-16 DIAGNOSIS — R062 Wheezing: Secondary | ICD-10-CM

## 2024-03-16 DIAGNOSIS — J209 Acute bronchitis, unspecified: Secondary | ICD-10-CM | POA: Diagnosis not present

## 2024-03-16 MED ORDER — PREDNISONE 20 MG PO TABS: 40.0000 mg | ORAL_TABLET | Freq: Every day | ORAL | 0 refills | Status: AC

## 2024-03-16 MED ORDER — ALBUTEROL SULFATE HFA 108 (90 BASE) MCG/ACT IN AERS: 1.0000 | INHALATION_SPRAY | Freq: Four times a day (QID) | RESPIRATORY_TRACT | 0 refills | Status: AC | PRN

## 2024-03-16 MED ORDER — BENZONATATE 100 MG PO CAPS: 100.0000 mg | ORAL_CAPSULE | Freq: Three times a day (TID) | ORAL | 0 refills | Status: DC | PRN

## 2024-03-16 NOTE — ED Triage Notes (Signed)
 Pt presents with URI sxs x 4 days. Pt denies emesis but does complain of diarrhea that started yesterday.

## 2024-03-16 NOTE — ED Provider Notes (Addendum)
 [facility]    CSN: [id number] Arrival date & time: 03/16/24  1156      History   Chief Complaint Chief Complaint  Patient presents with   URI    HPI Beth Gibson is a 38 y.o. female.   Patient presents with approximately 6 to 7-day history of nasal congestion, cough, body aches.  Reports that cough has worsened and she feels chest tightness.  Denies any fever.  She was seen on 3/21 for same sickness.  Had COVID and flu tests that were negative but was exposed to the flu from her son.  Therefore, she was placed on Tamiflu which she reports she is still taking.  Reports new symptom of diarrhea.  Patient is not reporting any shortness of breath.  Denies history of asthma.  Has been taking over-the-counter cold and flu medication as well with minimal improvement.   URI   Past Medical History:  Diagnosis Date   Abscess of axilla, right    Abscess of right axilla 06/20/2018   Anemia    Asthma    NO MEDS, SOB with activity   BV (bacterial vaginosis)    Chlamydia 2020   Depression    Gonorrhea 2020   Headache(784.0)    MIGRAINES   History of blood transfusion 2013   with child birth   PONV (postoperative nausea and vomiting)    nausea   Preterm labor    Previous cesarean delivery, antepartum condition or complication 12/10/2011   Classical at 28 weeks, plan repeat at 36 weeks - classical per [facility]    Status post elective abortion 12/10/2016   Trichomonas    UTI (lower urinary tract infection)    Yeast vaginitis     Patient Active Problem List   Diagnosis Date Noted   Axillary abscess 04/03/2021   Chronic cystitis 05/06/2020   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   DUB (dysfunctional uterine bleeding) 03/25/2020   Severe episode of recurrent major depressive disorder, with psychotic features (HCC)    MDD (major depressive disorder) 03/16/2016   Anemia due to chronic blood loss 11/01/2014   Obesity (BMI 30-39.9) 12/10/2011    Past Surgical  History:  Procedure Laterality Date   CESAREAN SECTION     CESAREAN SECTION  04/18/2012   Procedure: CESAREAN SECTION;  Surgeon: Lesly Dukes, MD;  Location: [facility];  Service: Gynecology;  Laterality: N/A;  repeat   cyst removed from L buttock     CYSTECTOMY  2009   left buttock   DILATION AND EVACUATION N/A 02/24/2017   Procedure: DILATATION AND EVACUATION;  Surgeon: Willodean Rosenthal, MD;  Location: [facility];  Service: Gynecology;  Laterality: N/A;   HYDRADENITIS EXCISION Right 11/04/2018   Procedure: EXCISION HIDRADENITIS RIGHT  AXILLA;  Surgeon: Griselda Miner, MD;  Location: [facility];  Service: General;  Laterality: Right;   INCISION AND DRAINAGE ABSCESS Right 11/01/2014   Procedure: INCISION AND DRAINAGE RIGHT AXILLARY ABSCESS;  Surgeon: Romie Levee, MD;  Location: [facility];  Service: General;  Laterality: Right;   INCISION AND DRAINAGE ABSCESS Right 06/21/2018   Procedure: INCISION AND DRAINAGE RIGHT ABSCESS AXILLA;  Surgeon: Griselda Miner, MD;  Location: [facility];  Service: General;  Laterality: Right;   INCISION AND DRAINAGE ABSCESS Right 04/03/2021   Procedure: INCISION AND DRAINAGE RIGHT AXILLA ABSCESS;  Surgeon: Manus Rudd, MD;  Location: [facility];  Service: General;  Laterality: Right;   IRRIGATION AND DEBRIDEMENT ABSCESS Right 08/09/2015  Procedure: IRRIGATION AND DEBRIDEMENT AXILLARY ABSCESS;  Surgeon: Ovidio Kin, MD;  Location: [facility];  Service: General;  Laterality: Right;   WISDOM TOOTH EXTRACTION     WISDOM TOOTH EXTRACTION  2009    OB History     Gravida  5   Para  2   Term  0   Preterm  2   AB  3   Living  2      SAB  1   IAB  0   Ectopic  0   Multiple  0   Live Births  2            Home Medications    Prior to Admission medications   Medication Sig Start Date End Date Taking? Authorizing Provider  albuterol (VENTOLIN HFA) 108 (90 Base) MCG/ACT inhaler Inhale 1-2 puffs into the lungs every 6 (six) hours as needed for  wheezing or shortness of breath. 03/16/24  Yes Lynzie Cliburn, Liscomb E, FNP  benzonatate (TESSALON) 100 MG capsule Take 1 capsule (100 mg total) by mouth every 8 (eight) hours as needed for cough. 03/16/24  Yes Kisa Fujii, Rolly Salter E, FNP  ibuprofen (ADVIL) 800 MG tablet Take 1 tablet (800 mg total) by mouth 3 (three) times daily. 11/29/23  Yes Piontek, Denny Peon, MD  oseltamivir (TAMIFLU) 75 MG capsule Take 1 capsule (75 mg total) by mouth every 12 (twelve) hours. 03/10/24  Yes Lenda Baratta, Rolly Salter E, FNP  predniSONE (DELTASONE) 20 MG tablet Take 2 tablets (40 mg total) by mouth daily for 5 days. 03/16/24 03/21/24 Yes Sakib Noguez, Acie Fredrickson, FNP  fluconazole (DIFLUCAN) 150 MG tablet Take by mouth. 02/03/24   [provider]  OVER THE COUNTER MEDICATION NyQuil    [provider]  oxyCODONE (OXY IR/ROXICODONE) 5 MG immediate release tablet Take by mouth. 09/29/21   [provider]    Family History Family History  Problem Relation Age of Onset   Hypertension Mother    Diabetes Mellitus I Father    Chronic Renal Failure Father     Social History Social History   Tobacco Use   Smoking status: Some Days    Current packs/day: 0.25    Average packs/day: 0.3 packs/day for 0.5 years (0.1 ttl pk-yrs)    Types: Cigarettes   Smokeless tobacco: Never  Vaping Use   Vaping status: Never Used  Substance Use Topics   Alcohol use: Not Currently    Alcohol/week: 1.0 standard drink of alcohol    Types: 1 Standard drinks or equivalent per week    Comment: occasional   Drug use: Not Currently    Types: Marijuana    Comment: Last use-08/2019     Allergies   Hydrocodone-acetaminophen and Vicodin [hydrocodone-acetaminophen]   Review of Systems Review of Systems Per HPI  Physical Exam Triage Vital Signs ED Triage Vitals  Encounter Vitals Group     BP 03/16/24 1208 (!) 161/109     Systolic BP Percentile --      Diastolic BP Percentile --      Pulse Rate 03/16/24 1208 79     Resp 03/16/24 1208 18     Temp  03/16/24 1208 98.1 F (36.7 C)     Temp Source 03/16/24 1208 Oral     SpO2 03/16/24 1208 98 %     Weight 03/16/24 1211 201 lb 15.1 oz (91.6 kg)     Height --      Head Circumference --      Peak Flow --  Pain Score 03/16/24 1207 0     Pain Loc --      Pain Education --      Exclude from Growth Chart --    No data found.  Updated Vital Signs BP (!) 161/109 (BP Location: Right Arm)   Pulse 79   Temp 98.1 F (36.7 C) (Oral)   Resp 18   Wt 201 lb 15.1 oz (91.6 kg)   LMP 03/16/2024 (Exact Date)   SpO2 98%   BMI 30.71 kg/m   Visual Acuity Right Eye Distance:   Left Eye Distance:   Bilateral Distance:    Right Eye Near:   Left Eye Near:    Bilateral Near:     Physical Exam Constitutional:      General: She is not in acute distress.    Appearance: Normal appearance. She is not toxic-appearing or diaphoretic.  HENT:     Head: Normocephalic and atraumatic.     Right Ear: Tympanic membrane and ear canal normal.     Left Ear: Tympanic membrane and ear canal normal.     Nose: Congestion present.     Mouth/Throat:     Mouth: Mucous membranes are moist.     Pharynx: No posterior oropharyngeal erythema.  Eyes:     Extraocular Movements: Extraocular movements intact.     Conjunctiva/sclera: Conjunctivae normal.     Pupils: Pupils are equal, round, and reactive to light.  Cardiovascular:     Rate and Rhythm: Normal rate and regular rhythm.     Pulses: Normal pulses.     Heart sounds: Normal heart sounds.  Pulmonary:     Effort: Pulmonary effort is normal. No respiratory distress.     Breath sounds: No stridor. Wheezing present. No rhonchi or rales.     Comments: Mild wheezing noted to auscultation. Abdominal:     General: Abdomen is flat. Bowel sounds are normal.     Palpations: Abdomen is soft.  Musculoskeletal:        General: Normal range of motion.     Cervical back: Normal range of motion.  Skin:    General: Skin is warm and dry.  Neurological:     General:  No focal deficit present.     Mental Status: She is alert and oriented to person, place, and time. Mental status is at baseline.  Psychiatric:        Mood and Affect: Mood normal.        Behavior: Behavior normal.      UC Treatments / Results  Labs (all labs ordered are listed, but only abnormal results are displayed) Labs Reviewed - No data to display  EKG   Radiology No results found.  Procedures Procedures (including critical care time)  Medications Ordered in UC Medications - No data to display  Initial Impression / Assessment and Plan / UC Course  I have reviewed the triage vital signs and the nursing notes.  Pertinent labs & imaging results that were available during my care of the patient were reviewed by me and considered in my medical decision making (see chart for details).     I am suspicious of acute bronchitis due to influenza.  Patient has mild wheezing on exam so will treat with albuterol inhaler, prednisone, benzonatate for cough.  Oxygen is normal and there is no tachypnea so patient is safe for discharge.  Offered patient chest imaging but she declined this at this time.  There are no indications of antibiotic therapy for bacterial  infection on exam at this time.  Blood pressure is elevated but suspect it is due to her taking over-the-counter medications as there are no signs of hypertensive urgency. Patient to monitor BP at home.  Patient was given strict return precautions.  Patient verbalized understanding and was agreeable with plan. Final Clinical Impressions(s) / UC Diagnoses   Final diagnoses:  Acute bronchitis, unspecified organism  Wheezing  Persistent cough     Discharge Instructions      I am suspicious that you have bronchitis so I have prescribed prednisone steroid, albuterol inhaler, and a cough medication.  Complete Tamiflu.  Follow-up if symptoms persist or worsen.    ED Prescriptions     Medication Sig Dispense Auth. Provider    predniSONE (DELTASONE) 20 MG tablet Take 2 tablets (40 mg total) by mouth daily for 5 days. 10 tablet Ettrick, [place] E, [place]   benzonatate (TESSALON) 100 MG capsule Take 1 capsule (100 mg total) by mouth every 8 (eight) hours as needed for cough. 21 capsule [place], [place] E, [place]   albuterol (VENTOLIN HFA) 108 (90 Base) MCG/ACT inhaler Inhale 1-2 puffs into the lungs every 6 (six) hours as needed for wheezing or shortness of breath. 1 each Gustavus Bryant, [place]      PDMP not reviewed this encounter.   Gustavus Bryant, [place] 03/16/24 1419    Gustavus Bryant, [place] 03/16/24 1420

## 2024-03-16 NOTE — Discharge Instructions (Signed)
 I am suspicious that you have bronchitis so I have prescribed prednisone steroid, albuterol inhaler, and a cough medication.  Complete Tamiflu.  Follow-up if symptoms persist or worsen.

## 2024-04-12 ENCOUNTER — Encounter (HOSPITAL_COMMUNITY): Payer: Self-pay

## 2024-04-12 ENCOUNTER — Other Ambulatory Visit: Payer: Self-pay

## 2024-04-12 ENCOUNTER — Emergency Department (HOSPITAL_COMMUNITY)
Admission: EM | Admit: 2024-04-12 | Discharge: 2024-04-12 | Disposition: A | Discharge status: Home / Self Care | Payer: MEDICAID | Attending: Emergency Medicine | Admitting: Emergency Medicine

## 2024-04-12 DIAGNOSIS — N611 Abscess of the breast and nipple: Secondary | ICD-10-CM | POA: Diagnosis present

## 2024-04-12 MED ORDER — DOXYCYCLINE HYCLATE 100 MG PO TABS
100.0000 mg | ORAL_TABLET | Freq: Once | ORAL | Status: AC
  Administered 2024-04-12: 100 mg via ORAL
  Filled 2024-04-12: qty 1

## 2024-04-12 MED ORDER — DOXYCYCLINE HYCLATE 100 MG PO CAPS: 100.0000 mg | ORAL_CAPSULE | Freq: Two times a day (BID) | ORAL | 0 refills | Status: DC

## 2024-04-12 MED ORDER — IBUPROFEN 800 MG PO TABS
800.0000 mg | ORAL_TABLET | Freq: Once | ORAL | Status: AC
  Administered 2024-04-12: 800 mg via ORAL
  Filled 2024-04-12: qty 1

## 2024-04-12 MED ORDER — LIDOCAINE HCL (PF) 1 % IJ SOLN
5.0000 mL | Freq: Once | INTRAMUSCULAR | Status: AC
  Administered 2024-04-12: 5 mL
  Filled 2024-04-12: qty 30

## 2024-04-12 MED ORDER — IBUPROFEN 600 MG PO TABS: 600.0000 mg | ORAL_TABLET | Freq: Four times a day (QID) | ORAL | 0 refills | Status: DC | PRN

## 2024-04-12 NOTE — ED Provider Notes (Signed)
 [place] EMERGENCY DEPARTMENT AT [facility] Provider Note   CSN: [id number] Arrival date & time: 04/12/24  0831     History  Chief Complaint  Patient presents with   Abscess   HPI Beth Gibson is a 38 y.o. female with history of multiple abscesses presenting for abscess on the breast.  States she noticed it about 2 days ago.  Located in the inner right breast.  States it was small at first but now golf ball size.  Not oozing or bleeding.  States she has had an abscess in that area before but did not require drainage. She has had multiple abscesses under her armpits which did require drainage.  She did not have fever.     Abscess      Home Medications Prior to Admission medications   Medication Sig Start Date End Date Taking? Authorizing Provider  doxycycline  (VIBRAMYCIN ) 100 MG capsule Take 1 capsule (100 mg total) by mouth 2 (two) times daily. 04/12/24  Yes Layton Naves K, PA-C  ibuprofen  (ADVIL ) 600 MG tablet Take 1 tablet (600 mg total) by mouth every 6 (six) hours as needed for mild pain (pain score 1-3). 04/12/24  Yes Janalee Mcmurray, PA-C  albuterol  (VENTOLIN  HFA) 108 (90 Base) MCG/ACT inhaler Inhale 1-2 puffs into the lungs every 6 (six) hours as needed for wheezing or shortness of breath. 03/16/24   Dodson Freestone, FNP  benzonatate  (TESSALON ) 100 MG capsule Take 1 capsule (100 mg total) by mouth every 8 (eight) hours as needed for cough. 03/16/24   Dodson Freestone, FNP  fluconazole  (DIFLUCAN ) 150 MG tablet Take by mouth. 02/03/24   [provider]  oseltamivir  (TAMIFLU ) 75 MG capsule Take 1 capsule (75 mg total) by mouth every 12 (twelve) hours. 03/10/24   Dodson Freestone, FNP  OVER THE COUNTER MEDICATION NyQuil    [provider]  oxyCODONE  (OXY IR/ROXICODONE ) 5 MG immediate release tablet Take by mouth. 09/29/21   [provider]      Allergies    Hydrocodone -acetaminophen  and Vicodin [hydrocodone -acetaminophen ]    Review of  Systems   See HPI  Physical Exam Updated Vital Signs BP (!) 149/100 (BP Location: Left Arm)   Pulse 90   Temp 97.6 F (36.4 C) (Oral)   Resp 18   Ht 5\' 8"  (1.727 m)   Wt 92.1 kg   LMP 03/16/2024 (Exact Date)   SpO2 100%   BMI 30.87 kg/m  Physical Exam Vitals and nursing note reviewed. Exam conducted with a chaperone present.  HENT:     Head: Normocephalic and atraumatic.     Mouth/Throat:     Mouth: Mucous membranes are moist.  Eyes:     General:        Right eye: No discharge.        Left eye: No discharge.     Conjunctiva/sclera: Conjunctivae normal.  Cardiovascular:     Rate and Rhythm: Normal rate and regular rhythm.     Pulses: Normal pulses.     Heart sounds: Normal heart sounds.  Pulmonary:     Effort: Pulmonary effort is normal.     Breath sounds: Normal breath sounds.  Chest:       Comments: Chaperone present Abdominal:     General: Abdomen is flat.     Palpations: Abdomen is soft.  Skin:    General: Skin is warm and dry.  Neurological:     General: No focal deficit present.  Psychiatric:  Mood and Affect: Mood normal.     ED Results / Procedures / Treatments   Labs (all labs ordered are listed, but only abnormal results are displayed) Labs Reviewed - No data to display  EKG None  Radiology No results found.  Procedures .Incision and Drainage  Date/Time: 04/12/2024 10:18 AM  Performed by: Janalee Mcmurray, PA-C Authorized by: Janalee Mcmurray, PA-C   Consent:    Consent obtained:  Verbal   Consent given by:  Patient   Risks discussed:  Bleeding, incomplete drainage, pain and damage to other organs   Alternatives discussed:  No treatment Universal protocol:    Procedure explained and questions answered to patient or proxy's satisfaction: yes     Relevant documents present and verified: yes     Test results available : yes     Imaging studies available: yes     Required blood products, implants, devices, and special equipment  available: yes     Site/side marked: yes     Immediately prior to procedure, a time out was called: yes     Patient identity confirmed:  Verbally with patient Location:    Type:  Abscess   Location: right breast. Pre-procedure details:    Skin preparation:  Betadine Anesthesia:    Anesthesia method:  Local infiltration   Local anesthetic:  Lidocaine  1% w/o epi Procedure type:    Complexity:  Complex Procedure details:    Incision type: Needle aspiration with 18-gauge.   Incision depth:  Subcutaneous   Wound management:  Probed and deloculated, irrigated with saline and extensive cleaning   Drainage:  Purulent   Drainage amount:  Moderate   Wound treatment:  Wound left open   Packing materials:  None Post-procedure details:    Procedure completion:  Tolerated well, no immediate complications Comments:     Chaperone present     Medications Ordered in ED Medications  lidocaine  (PF) (XYLOCAINE ) 1 % injection 5 mL (5 mLs Infiltration Given by Other 04/12/24 0952)  ibuprofen  (ADVIL ) tablet 800 mg (800 mg Oral Given 04/12/24 0946)  doxycycline  (VIBRA -TABS) tablet 100 mg (100 mg Oral Given 04/12/24 1023)    ED Course/ Medical Decision Making/ A&P                                 Medical Decision Making Risk Prescription drug management.   58 well-appearing female presenting for abscess.  Exam findings concerning for abscess about the right medial aspect of the breast.  Appeared superficial.  I&D procedure went very well.  Per chart, received her Tdap 5 years ago.  Started her on doxycycline .  Sent a few tablets of ibuprofen  to her pharmacy per patient request for pain.  Advised her to follow-up with the breast center and her PCP.  Discussed return precautions.  Discharged in good condition.        Final Clinical Impression(s) / ED Diagnoses Final diagnoses:  Breast abscess    Rx / DC Orders ED Discharge Orders          Ordered    doxycycline  (VIBRAMYCIN ) 100 MG  capsule  2 times daily        04/12/24 1021    ibuprofen  (ADVIL ) 600 MG tablet  Every 6 hours PRN        04/12/24 1024              Orchid Glassberg K, PA-C 04/12/24 1024  Trish Furl, MD 04/13/24 [phone]

## 2024-04-12 NOTE — ED Triage Notes (Addendum)
 Patient has an abscess on her inner right breast. Stated she wants it popped. Has had it for 2 days. Size of a golf ball. She gets them cut open. No drainage. Redness around the area.

## 2024-04-12 NOTE — Discharge Instructions (Addendum)
 Evaluation today revealed that he did have an abscess about the right inner breast.  Incision and drainage procedure here went very well.  I am starting you on doxycycline  to treat associated infection.  Please follow-up with the breast center and your PCP.  If you develop fever, worsening redness swelling or pustulant discharge from the site or any other concerning symptom please return to the ED for further evaluation.

## 2024-04-13 ENCOUNTER — Other Ambulatory Visit: Payer: Self-pay | Admitting: General Surgery

## 2024-04-13 DIAGNOSIS — N611 Abscess of the breast and nipple: Secondary | ICD-10-CM

## 2024-04-24 ENCOUNTER — Other Ambulatory Visit: Payer: MEDICAID

## 2024-05-17 ENCOUNTER — Ambulatory Visit
Admission: EM | Admit: 2024-05-17 | Discharge: 2024-05-17 | Disposition: A | Discharge status: Home / Self Care | Payer: MEDICAID | Attending: Physician Assistant | Admitting: Physician Assistant

## 2024-05-17 DIAGNOSIS — K047 Periapical abscess without sinus: Secondary | ICD-10-CM | POA: Diagnosis not present

## 2024-05-17 MED ORDER — AMOXICILLIN 500 MG PO CAPS: 500.0000 mg | ORAL_CAPSULE | Freq: Three times a day (TID) | ORAL | 0 refills | Status: DC

## 2024-05-17 NOTE — Discharge Instructions (Addendum)
    [facility] or Kaye Parsons and [employer] are great dental provider resources based on [employer].

## 2024-05-17 NOTE — ED Provider Notes (Signed)
 [facility]    CSN: [id number] Arrival date & time: 05/17/24  0817      History   Chief Complaint Chief Complaint  Patient presents with   Dental Pain    HPI Beth Gibson is a 38 y.o. female.   Patient here today for evaluation of dental abscess to her right lower molar area that started today.  She reports significant pain that she woke with.  She has not any fever.  She does have poor dentition at baseline but does not currently have a dentist.  The history is provided by the patient.    Past Medical History:  Diagnosis Date   Abscess of axilla, right    Abscess of right axilla 06/20/2018   Anemia    Asthma    NO MEDS, SOB with activity   BV (bacterial vaginosis)    Chlamydia 2020   Depression    Gonorrhea 2020   Headache(784.0)    MIGRAINES   History of blood transfusion 2013   with child birth   PONV (postoperative nausea and vomiting)    nausea   Preterm labor    Previous cesarean delivery, antepartum condition or complication 12/10/2011   Classical at 28 weeks, plan repeat at 36 weeks - classical per [facility]    Status post elective abortion 12/10/2016   Trichomonas    UTI (lower urinary tract infection)    Yeast vaginitis     Patient Active Problem List   Diagnosis Date Noted   Axillary abscess 04/03/2021   Chronic cystitis 05/06/2020   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   DUB (dysfunctional uterine bleeding) 03/25/2020   Severe episode of recurrent major depressive disorder, with psychotic features (HCC)    MDD (major depressive disorder) 03/16/2016   Anemia due to chronic blood loss 11/01/2014   Obesity (BMI 30-39.9) 12/10/2011    Past Surgical History:  Procedure Laterality Date   CESAREAN SECTION     CESAREAN SECTION  04/18/2012   Procedure: CESAREAN SECTION;  Surgeon: Rik Chasten, MD;  Location: [facility];  Service: Gynecology;  Laterality: N/A;  repeat   cyst removed from L buttock     CYSTECTOMY  2009   left buttock    DILATION AND EVACUATION N/A 02/24/2017   Procedure: DILATATION AND EVACUATION;  Surgeon: Lenord Radon, MD;  Location: [facility];  Service: Gynecology;  Laterality: N/A;   HYDRADENITIS EXCISION Right 11/04/2018   Procedure: EXCISION HIDRADENITIS RIGHT  AXILLA;  Surgeon: Caralyn Chandler, MD;  Location: [facility];  Service: General;  Laterality: Right;   INCISION AND DRAINAGE ABSCESS Right 11/01/2014   Procedure: INCISION AND DRAINAGE RIGHT AXILLARY ABSCESS;  Surgeon: Joyce Nixon, MD;  Location: [facility];  Service: General;  Laterality: Right;   INCISION AND DRAINAGE ABSCESS Right 06/21/2018   Procedure: INCISION AND DRAINAGE RIGHT ABSCESS AXILLA;  Surgeon: Caralyn Chandler, MD;  Location: [facility];  Service: General;  Laterality: Right;   INCISION AND DRAINAGE ABSCESS Right 04/03/2021   Procedure: INCISION AND DRAINAGE RIGHT AXILLA ABSCESS;  Surgeon: Dareen Ebbing, MD;  Location: [facility];  Service: General;  Laterality: Right;   IRRIGATION AND DEBRIDEMENT ABSCESS Right 08/09/2015   Procedure: IRRIGATION AND DEBRIDEMENT AXILLARY ABSCESS;  Surgeon: Juanita Norlander, MD;  Location: [facility];  Service: General;  Laterality: Right;   WISDOM TOOTH EXTRACTION     WISDOM TOOTH EXTRACTION  2009    OB History     Gravida  5   Para  2   Term  0   Preterm  2   AB  3   Living  2      SAB  1   IAB  0   Ectopic  0   Multiple  0   Live Births  2            Home Medications    Prior to Admission medications   Medication Sig Start Date End Date Taking? Authorizing Provider  amoxicillin  (AMOXIL ) 500 MG capsule Take 1 capsule (500 mg total) by mouth 3 (three) times daily. 05/17/24  Yes Vernestine Gondola, PA-C  ibuprofen  (ADVIL ) 600 MG tablet Take 1 tablet (600 mg total) by mouth every 6 (six) hours as needed for mild pain (pain score 1-3). 04/12/24  Yes Janalee Mcmurray, PA-C  albuterol  (VENTOLIN  HFA) 108 (90 Base) MCG/ACT inhaler Inhale 1-2 puffs into the lungs every 6 (six) hours  as needed for wheezing or shortness of breath. 03/16/24   Dodson Freestone, FNP    Family History Family History  Problem Relation Age of Onset   Hypertension Mother    Diabetes Mellitus I Father    Chronic Renal Failure Father     Social History Social History   Tobacco Use   Smoking status: Some Days    Current packs/day: 0.25    Average packs/day: 0.3 packs/day for 0.5 years (0.1 ttl pk-yrs)    Types: Cigarettes   Smokeless tobacco: Never  Vaping Use   Vaping status: Never Used  Substance Use Topics   Alcohol use: Yes    Alcohol/week: 1.0 standard drink of alcohol    Types: 1 Standard drinks or equivalent per week    Comment: occasional   Drug use: Not Currently    Types: Marijuana    Comment: Last use-08/2019     Allergies   Hydrocodone -acetaminophen  and Vicodin [hydrocodone -acetaminophen ]   Review of Systems Review of Systems  Constitutional:  Negative for chills and fever.  HENT:  Positive for dental problem.   Eyes:  Negative for discharge and redness.  Respiratory:  Negative for shortness of breath.   Gastrointestinal:  Negative for abdominal pain, nausea and vomiting.     Physical Exam Triage Vital Signs ED Triage Vitals  Encounter Vitals Group     BP      Systolic BP Percentile      Diastolic BP Percentile      Pulse      Resp      Temp      Temp src      SpO2      Weight      Height      Head Circumference      Peak Flow      Pain Score      Pain Loc      Pain Education      Exclude from Growth Chart    No data found.  Updated Vital Signs BP (!) 146/97 (BP Location: Right Arm)   Pulse 89   Temp 98.4 F (36.9 C) (Oral)   Resp 18   Ht 5\' 8"  (1.727 m)   Wt 203 lb (92.1 kg)   LMP 05/10/2024 (Exact Date)   SpO2 98%   BMI 30.87 kg/m   Visual Acuity Right Eye Distance:   Left Eye Distance:   Bilateral Distance:    Right Eye Near:   Left Eye Near:    Bilateral Near:     Physical Exam Vitals and  nursing note reviewed.   Constitutional:      General: She is not in acute distress.    Appearance: Normal appearance. She is not ill-appearing.  HENT:     Head: Normocephalic and atraumatic.     Mouth/Throat:     Comments: Poor dentition noted throughout with multiple caries and missing teeth.  Significant swelling and erythema noted to posterior right lower gumline Eyes:     Conjunctiva/sclera: Conjunctivae normal.  Cardiovascular:     Rate and Rhythm: Normal rate.  Pulmonary:     Effort: Pulmonary effort is normal.  Neurological:     Mental Status: She is alert.  Psychiatric:        Mood and Affect: Mood normal.        Behavior: Behavior normal.        Thought Content: Thought content normal.      UC Treatments / Results  Labs (all labs ordered are listed, but only abnormal results are displayed) Labs Reviewed - No data to display  EKG   Radiology No results found.  Procedures Procedures (including critical care time)  Medications Ordered in UC Medications - No data to display  Initial Impression / Assessment and Plan / UC Course  I have reviewed the triage vital signs and the nursing notes.  Pertinent labs & imaging results that were available during my care of the patient were reviewed by me and considered in my medical decision making (see chart for details).    Will treat to cover abscess with amoxicillin  and strongly advised further evaluation by dentistry soon as possible.  Contact information provided for same.  Recommended evaluation in the emergency department with any worsening or persistent symptoms.  Final Clinical Impressions(s) / UC Diagnoses   Final diagnoses:  Dental abscess     Discharge Instructions         [facility] or [facility] are great dental provider resources based on your insurance.   ED Prescriptions     Medication Sig Dispense Auth. Provider   amoxicillin  (AMOXIL ) 500 MG capsule Take 1 capsule (500 mg  total) by mouth 3 (three) times daily. 21 capsule Vernestine Gondola, PA-C      PDMP not reviewed this encounter.   Vernestine Gondola, PA-C 05/17/24 1101

## 2024-05-17 NOTE — ED Triage Notes (Signed)
"  I  have a dental abscess on my right lower gum/tooth that is killing me". No fever.

## 2024-05-22 ENCOUNTER — Ambulatory Visit: Payer: MEDICAID

## 2024-07-07 ENCOUNTER — Encounter: Payer: Self-pay | Admitting: Advanced Practice Midwife

## 2024-07-16 ENCOUNTER — Ambulatory Visit: Payer: MEDICAID

## 2024-10-02 ENCOUNTER — Encounter (HOSPITAL_COMMUNITY): Payer: Self-pay | Admitting: Emergency Medicine

## 2024-10-02 ENCOUNTER — Emergency Department (HOSPITAL_COMMUNITY)

## 2024-10-02 ENCOUNTER — Other Ambulatory Visit: Payer: Self-pay

## 2024-10-02 ENCOUNTER — Emergency Department (HOSPITAL_COMMUNITY)
Admission: EM | Admit: 2024-10-02 | Discharge: 2024-10-02 | Disposition: A | Discharge status: Home / Self Care | Attending: Emergency Medicine | Admitting: Emergency Medicine

## 2024-10-02 DIAGNOSIS — S39012A Strain of muscle, fascia and tendon of lower back, initial encounter: Secondary | ICD-10-CM | POA: Diagnosis not present

## 2024-10-02 DIAGNOSIS — Y92410 Unspecified street and highway as the place of occurrence of the external cause: Secondary | ICD-10-CM | POA: Diagnosis not present

## 2024-10-02 DIAGNOSIS — M542 Cervicalgia: Secondary | ICD-10-CM | POA: Diagnosis present

## 2024-10-02 MED ORDER — METHOCARBAMOL 500 MG PO TABS: 500.0000 mg | ORAL_TABLET | Freq: Two times a day (BID) | ORAL | 0 refills | Status: AC

## 2024-10-02 MED ORDER — IBUPROFEN 600 MG PO TABS: 600.0000 mg | ORAL_TABLET | Freq: Four times a day (QID) | ORAL | 0 refills | Status: DC | PRN

## 2024-10-02 MED ORDER — LIDOCAINE 5 % EX PTCH
2.0000 | MEDICATED_PATCH | CUTANEOUS | Status: DC
  Administered 2024-10-02: 2 via TRANSDERMAL
  Filled 2024-10-02: qty 2

## 2024-10-02 MED ORDER — OXYCODONE-ACETAMINOPHEN 5-325 MG PO TABS
1.0000 | ORAL_TABLET | Freq: Once | ORAL | Status: AC
  Administered 2024-10-02: 1 via ORAL
  Filled 2024-10-02: qty 1

## 2024-10-02 MED ORDER — LIDOCAINE 5 % EX PTCH: 1.0000 | MEDICATED_PATCH | CUTANEOUS | 0 refills | Status: AC

## 2024-10-02 MED ORDER — IBUPROFEN 400 MG PO TABS
600.0000 mg | ORAL_TABLET | Freq: Once | ORAL | Status: AC
  Administered 2024-10-02: 600 mg via ORAL
  Filled 2024-10-02: qty 1
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
## 2024-10-02 NOTE — ED Notes (Signed)
 Patient transported to X-ray

## 2024-10-03 LAB — POC URINE PREG, ED: Preg Test, Ur: NEGATIVE

## 2024-10-31 ENCOUNTER — Encounter: Payer: Self-pay | Admitting: Emergency Medicine

## 2024-10-31 ENCOUNTER — Ambulatory Visit
Admission: EM | Admit: 2024-10-31 | Discharge: 2024-10-31 | Disposition: A | Discharge status: Home / Self Care | Payer: MEDICAID

## 2024-10-31 DIAGNOSIS — L0501 Pilonidal cyst with abscess: Secondary | ICD-10-CM | POA: Diagnosis not present

## 2024-10-31 MED ORDER — DOXYCYCLINE HYCLATE 100 MG PO CAPS: 100.0000 mg | ORAL_CAPSULE | Freq: Two times a day (BID) | ORAL | 0 refills | Status: AC

## 2024-10-31 MED ORDER — KETOROLAC TROMETHAMINE 10 MG PO TABS: 10.0000 mg | ORAL_TABLET | Freq: Four times a day (QID) | ORAL | 0 refills | Status: AC | PRN

## 2024-10-31 NOTE — ED Triage Notes (Signed)
 Pt presents c/o abscess and swelling in bikini line x 2 days. Pt states,  My bikini line is all swollen and painful. I also have 2 abscesses on there top of my crack. One burst while I was just in the bathroom and has puss coming out but the other is still there and hurts real bad. I can't sit or stand comfortably.

## 2024-10-31 NOTE — Discharge Instructions (Addendum)
-  Doxycycline  twice daily for 7 days.  Make sure to wear sunscreen while spending time outside while on this medication as it can increase your chance of sunburn. You can take this medication with food if you have a sensitive stomach. -Soak in hot tub or use a warm compress on the abscess -Your symptoms should be getting better instead of worse, especially after 2 days. If your symptoms are getting worse, including worsening swelling pain or new fevers, please head to the Emergency Room. -Toradol  (Ketorolac ), one pill up to every 6 hours for pain. Make sure to take this with food. Avoid other NSAIDs like ibuprofen , alleve, naproxen  while taking this medication.  -You can also take Tylenol  1000mg  up to 3x daily

## 2024-10-31 NOTE — ED Provider Notes (Signed)
 [facility]    CSN: [id number] Arrival date & time: 10/31/24  1023      History   Chief Complaint Chief Complaint  Patient presents with   Abscess   Leg Swelling    HPI Beth Gibson is a 38 y.o. female presenting with pilonidal abscess. H/o hidradenitis.  Pt presents c/o abscess and swelling in bikini line x 2 days. Pt states,  My bikini line is all swollen and painful. I also have 2 abscesses on there top of my crack. One burst while I was just in the bathroom and has puss coming out but the other is still there and hurts real bad. I can't sit or stand comfortably.  Denies vaginal or urinary symptoms    HPI  Past Medical History:  Diagnosis Date   Abscess of axilla, right    Abscess of right axilla 06/20/2018   Anemia    Asthma    NO MEDS, SOB with activity   BV (bacterial vaginosis)    Chlamydia 2020   Depression    Gonorrhea 2020   Headache(784.0)    MIGRAINES   History of blood transfusion 2013   with child birth   PONV (postoperative nausea and vomiting)    nausea   Preterm labor    Previous cesarean delivery, antepartum condition or complication 12/10/2011   Classical at 28 weeks, plan repeat at 36 weeks - classical per [facility]    Status post elective abortion 12/10/2016   Trichomonas    UTI (lower urinary tract infection)    Yeast vaginitis     Patient Active Problem List   Diagnosis Date Noted   Axillary abscess 04/03/2021   Chronic cystitis 05/06/2020   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   DUB (dysfunctional uterine bleeding) 03/25/2020   Severe episode of recurrent major depressive disorder, with psychotic features (HCC)    MDD (major depressive disorder) 03/16/2016   Anemia due to chronic blood loss 11/01/2014   Obesity (BMI 30-39.9) 12/10/2011    Past Surgical History:  Procedure Laterality Date   CESAREAN SECTION     CESAREAN SECTION  04/18/2012   Procedure: CESAREAN SECTION;  Surgeon: Burnard VEAR Pate, MD;  Location: [facility];  Service: Gynecology;  Laterality: N/A;  repeat   cyst removed from L buttock     CYSTECTOMY  2009   left buttock   DILATION AND EVACUATION N/A 02/24/2017   Procedure: DILATATION AND EVACUATION;  Surgeon: Elveria Mungo, MD;  Location: [facility];  Service: Gynecology;  Laterality: N/A;   HYDRADENITIS EXCISION Right 11/04/2018   Procedure: EXCISION HIDRADENITIS RIGHT  AXILLA;  Surgeon: Curvin Deward MOULD, MD;  Location: [facility];  Service: General;  Laterality: Right;   INCISION AND DRAINAGE ABSCESS Right 11/01/2014   Procedure: INCISION AND DRAINAGE RIGHT AXILLARY ABSCESS;  Surgeon: Bernarda Ned, MD;  Location: [facility];  Service: General;  Laterality: Right;   INCISION AND DRAINAGE ABSCESS Right 06/21/2018   Procedure: INCISION AND DRAINAGE RIGHT ABSCESS AXILLA;  Surgeon: Curvin Deward MOULD, MD;  Location: [facility];  Service: General;  Laterality: Right;   INCISION AND DRAINAGE ABSCESS Right 04/03/2021   Procedure: INCISION AND DRAINAGE RIGHT AXILLA ABSCESS;  Surgeon: Belinda Cough, MD;  Location: [facility];  Service: General;  Laterality: Right;   IRRIGATION AND DEBRIDEMENT ABSCESS Right 08/09/2015   Procedure: IRRIGATION AND DEBRIDEMENT AXILLARY ABSCESS;  Surgeon: Alm Angle, MD;  Location: [facility];  Service: General;  Laterality: Right;   WISDOM TOOTH  EXTRACTION     WISDOM TOOTH EXTRACTION  2009    OB History     Gravida  5   Para  2   Term  0   Preterm  2   AB  3   Living  2      SAB  1   IAB  0   Ectopic  0   Multiple  0   Live Births  2            Home Medications    Prior to Admission medications   Medication Sig Start Date End Date Taking? Authorizing Provider  doxycycline  (VIBRAMYCIN ) 100 MG capsule Take 1 capsule (100 mg total) by mouth 2 (two) times daily for 7 days. 10/31/24 11/07/24 Yes Hamdi Kley E, PA-C  ketorolac  (TORADOL ) 10 MG tablet Take 1 tablet (10 mg total) by mouth every 6 (six) hours as needed. 10/31/24  Yes Talli Kimmer E,  PA-C  Vitamin D, Ergocalciferol, (DRISDOL) 1.25 MG (50000 UNIT) CAPS capsule Take 50,000 Units by mouth once a week. 09/11/24  Yes [provider]  albuterol  (VENTOLIN  HFA) 108 (90 Base) MCG/ACT inhaler Inhale 1-2 puffs into the lungs every 6 (six) hours as needed for wheezing or shortness of breath. 03/16/24   Hazen Darryle BRAVO, FNP  lidocaine  (LIDODERM ) 5 % Place 1 patch onto the skin daily. Remove & Discard patch within 12 hours or as directed by MD 10/02/24   Myriam Dorn BROCKS, PA  methocarbamol  (ROBAXIN ) 500 MG tablet Take 1 tablet (500 mg total) by mouth 2 (two) times daily. 10/02/24   Myriam Dorn BROCKS, PA    Family History Family History  Problem Relation Age of Onset   Hypertension Mother    Diabetes Mellitus I Father    Chronic Renal Failure Father     Social History Social History   Tobacco Use   Smoking status: Every Day    Current packs/day: 0.25    Average packs/day: 0.3 packs/day for 0.5 years (0.1 ttl pk-yrs)    Types: Cigarettes    Passive exposure: Current   Smokeless tobacco: Never  Vaping Use   Vaping status: Never Used  Substance Use Topics   Alcohol use: Yes    Alcohol/week: 1.0 standard drink of alcohol    Types: 1 Standard drinks or equivalent per week    Comment: occasional   Drug use: Not Currently    Types: Marijuana    Comment: Last use-08/2019     Allergies   Hydrocodone -acetaminophen  and Vicodin [hydrocodone -acetaminophen ]   Review of Systems Review of Systems  Skin:        Skin infection     Physical Exam Triage Vital Signs ED Triage Vitals  Encounter Vitals Group     BP 10/31/24 1135 (!) 139/93     Girls Systolic BP Percentile --      Girls Diastolic BP Percentile --      Boys Systolic BP Percentile --      Boys Diastolic BP Percentile --      Pulse Rate 10/31/24 1135 (!) 101     Resp 10/31/24 1135 18     Temp 10/31/24 1135 97.9 F (36.6 C)     Temp Source 10/31/24 1135 Oral     SpO2 10/31/24 1135 98 %     Weight  10/31/24 1134 203 lb 0.7 oz (92.1 kg)     Height --      Head Circumference --      Peak Flow --  Pain Score 10/31/24 1133 10     Pain Loc --      Pain Education --      Exclude from Growth Chart --    No data found.  Updated Vital Signs BP (!) 139/93 (BP Location: Left Arm)   Pulse (!) 101   Temp 97.9 F (36.6 C) (Oral)   Resp 18   Wt 203 lb 0.7 oz (92.1 kg)   LMP 10/20/2024 (Approximate)   SpO2 98%   BMI 30.87 kg/m   Visual Acuity Right Eye Distance:   Left Eye Distance:   Bilateral Distance:    Right Eye Near:   Left Eye Near:    Bilateral Near:     Physical Exam Vitals reviewed.  Constitutional:      General: She is not in acute distress.    Appearance: Normal appearance. She is not ill-appearing.  HENT:     Head: Normocephalic and atraumatic.  Pulmonary:     Effort: Pulmonary effort is normal.  Skin:    Comments: Gluteal cleft with 1.5x1cm pilonidal abscess. There is fluctuance and spontaneous drainage.  There is inguinal lymphadenopathy.  Neurological:     General: No focal deficit present.     Mental Status: She is alert and oriented to person, place, and time.  Psychiatric:        Mood and Affect: Mood normal.        Behavior: Behavior normal.        Thought Content: Thought content normal.        Judgment: Judgment normal.      UC Treatments / Results  Labs (all labs ordered are listed, but only abnormal results are displayed) Labs Reviewed - No data to display  EKG   Radiology No results found.  Procedures Procedures (including critical care time)  Medications Ordered in UC Medications - No data to display  Initial Impression / Assessment and Plan / UC Course  I have reviewed the triage vital signs and the nursing notes.  Pertinent labs & imaging results that were available during my care of the patient were reviewed by me and considered in my medical decision making (see chart for details).     Patient is a pleasant  38 year old female presenting with pilonidal abscess, and associated inguinal lymphadenopathy.  She is afebrile but borderline tachycardic.  Antipyretic has not been administered.  The pilonidal abscess is spontaneously draining. LMP 10/20/2024, states she is not sexually active and could not be pregnant.   Doxycycline  sent.  Encouraged warm compresses or soak in hot baths.  Lymphadenopathy should resolve as abscess resolves.  Discussed that pilonidal abscesses sometimes require surgical intervention, so if her symptoms worsen instead of improve, she should head to the emergency department.  Final Clinical Impressions(s) / UC Diagnoses   Final diagnoses:  Pilonidal abscess     Discharge Instructions      -Doxycycline  twice daily for 7 days.  Make sure to wear sunscreen while spending time outside while on this medication as it can increase your chance of sunburn. You can take this medication with food if you have a sensitive stomach. -Soak in hot tub or use a warm compress on the abscess -Your symptoms should be getting better instead of worse, especially after 2 days. If your symptoms are getting worse, including worsening swelling pain or new fevers, please head to the Emergency Room. -Toradol  (Ketorolac ), one pill up to every 6 hours for pain. Make sure to take this with food. Avoid  other NSAIDs like ibuprofen , alleve, naproxen  while taking this medication.  -You can also take Tylenol  1000mg  up to 3x daily    ED Prescriptions     Medication Sig Dispense Auth. Provider   doxycycline  (VIBRAMYCIN ) 100 MG capsule Take 1 capsule (100 mg total) by mouth 2 (two) times daily for 7 days. 14 capsule Shanequa Whitenight E, PA-C   ketorolac  (TORADOL ) 10 MG tablet Take 1 tablet (10 mg total) by mouth every 6 (six) hours as needed. 20 tablet Tayloranne Lekas E, PA-C      PDMP not reviewed this encounter.   Arlyss Leita BRAVO, PA-C 10/31/24 1216

## 2024-12-29 ENCOUNTER — Encounter (HOSPITAL_COMMUNITY): Payer: Self-pay

## 2024-12-29 ENCOUNTER — Emergency Department (HOSPITAL_COMMUNITY)
Admission: EM | Admit: 2024-12-29 | Discharge: 2024-12-29 | Disposition: A | Discharge status: Home / Self Care | Attending: Emergency Medicine | Admitting: Emergency Medicine

## 2024-12-29 ENCOUNTER — Other Ambulatory Visit: Payer: Self-pay

## 2024-12-29 DIAGNOSIS — I10 Essential (primary) hypertension: Secondary | ICD-10-CM | POA: Diagnosis not present

## 2024-12-29 DIAGNOSIS — Z79899 Other long term (current) drug therapy: Secondary | ICD-10-CM | POA: Diagnosis not present

## 2024-12-29 DIAGNOSIS — N611 Abscess of the breast and nipple: Secondary | ICD-10-CM | POA: Insufficient documentation

## 2024-12-29 DIAGNOSIS — D649 Anemia, unspecified: Secondary | ICD-10-CM | POA: Insufficient documentation

## 2024-12-29 LAB — CBC WITH DIFFERENTIAL/PLATELET
Abs Immature Granulocytes: 0.02 K/uL (ref 0.00–0.07)
Basophils Absolute: 0.1 K/uL (ref 0.0–0.1)
Basophils Relative: 1 %
Eosinophils Absolute: 0.2 K/uL (ref 0.0–0.5)
Eosinophils Relative: 3 %
HCT: 33.8 % — ABNORMAL LOW (ref 36.0–46.0)
Hemoglobin: 9.7 g/dL — ABNORMAL LOW (ref 12.0–15.0)
Immature Granulocytes: 0 %
Lymphocytes Relative: 27 %
Lymphs Abs: 1.5 K/uL (ref 0.7–4.0)
MCH: 18.2 pg — ABNORMAL LOW (ref 26.0–34.0)
MCHC: 28.7 g/dL — ABNORMAL LOW (ref 30.0–36.0)
MCV: 63.4 fL — ABNORMAL LOW (ref 80.0–100.0)
Monocytes Absolute: 0.3 K/uL (ref 0.1–1.0)
Monocytes Relative: 5 %
Neutro Abs: 3.5 K/uL (ref 1.7–7.7)
Neutrophils Relative %: 64 %
Platelets: 180 K/uL (ref 150–400)
RBC: 5.33 MIL/uL — ABNORMAL HIGH (ref 3.87–5.11)
RDW: 22.7 % — ABNORMAL HIGH (ref 11.5–15.5)
WBC: 5.5 K/uL (ref 4.0–10.5)
nRBC: 0 % (ref 0.0–0.2)

## 2024-12-29 LAB — COMPREHENSIVE METABOLIC PANEL WITH GFR
ALT: 12 U/L (ref 0–44)
AST: 22 U/L (ref 15–41)
Albumin: 4.1 g/dL (ref 3.5–5.0)
Alkaline Phosphatase: 59 U/L (ref 38–126)
Anion gap: 10 (ref 5–15)
BUN: 7 mg/dL (ref 6–20)
CO2: 23 mmol/L (ref 22–32)
Calcium: 9.4 mg/dL (ref 8.9–10.3)
Chloride: 102 mmol/L (ref 98–111)
Creatinine, Ser: 0.65 mg/dL (ref 0.44–1.00)
GFR, Estimated: >60 mL/min
Glucose, Bld: 109 mg/dL — ABNORMAL HIGH (ref 70–99)
Potassium: 4.1 mmol/L (ref 3.5–5.1)
Sodium: 135 mmol/L (ref 135–145)
Total Bilirubin: 0.3 mg/dL (ref 0.0–1.2)
Total Protein: 8.2 g/dL — ABNORMAL HIGH (ref 6.5–8.1)

## 2024-12-29 LAB — HCG, SERUM, QUALITATIVE: Preg, Serum: NEGATIVE

## 2024-12-29 MED ORDER — OXYCODONE-ACETAMINOPHEN 5-325 MG PO TABS
1.0000 | ORAL_TABLET | ORAL | Status: DC | PRN
  Administered 2024-12-29: 1 via ORAL
  Filled 2024-12-29: qty 1

## 2024-12-29 MED ORDER — AMLODIPINE BESYLATE 5 MG PO TABS: 5.0000 mg | ORAL_TABLET | Freq: Every day | ORAL | 0 refills | Status: AC

## 2024-12-29 MED ORDER — IBUPROFEN 400 MG PO TABS
600.0000 mg | ORAL_TABLET | Freq: Once | ORAL | Status: AC
  Administered 2024-12-29: 600 mg via ORAL
  Filled 2024-12-29: qty 1

## 2024-12-29 MED ORDER — IBUPROFEN 600 MG PO TABS: 600.0000 mg | ORAL_TABLET | Freq: Four times a day (QID) | ORAL | 0 refills | Status: AC | PRN

## 2024-12-29 MED ORDER — SULFAMETHOXAZOLE-TRIMETHOPRIM 800-160 MG PO TABS
1.0000 | ORAL_TABLET | Freq: Once | ORAL | Status: AC
  Administered 2024-12-29: 1 via ORAL
  Filled 2024-12-29: qty 1

## 2024-12-29 MED ORDER — LIDOCAINE-EPINEPHRINE 1 %-1:100000 IJ SOLN
10.0000 mL | INTRAMUSCULAR | Status: DC
  Administered 2024-12-29: 10 mL

## 2024-12-29 MED ORDER — OXYCODONE-ACETAMINOPHEN 5-325 MG PO TABS: 1.0000 | ORAL_TABLET | Freq: Four times a day (QID) | ORAL | 0 refills | Status: AC | PRN

## 2024-12-29 MED ORDER — BACITRACIN ZINC 500 UNIT/GM EX OINT
TOPICAL_OINTMENT | Freq: Two times a day (BID) | CUTANEOUS | Status: DC
  Administered 2024-12-29: 1 via TOPICAL
  Filled 2024-12-29: qty 0.9

## 2024-12-29 MED ORDER — SULFAMETHOXAZOLE-TRIMETHOPRIM 800-160 MG PO TABS: 1.0000 | ORAL_TABLET | Freq: Two times a day (BID) | ORAL | 0 refills | Status: AC

## 2024-12-29 MED ORDER — LIDOCAINE-EPINEPHRINE (PF) 2 %-1:200000 IJ SOLN
10.0000 mL | Freq: Once | INTRAMUSCULAR | Status: AC
  Administered 2024-12-29: 10 mL
  Filled 2024-12-29: qty 20

## 2024-12-29 NOTE — ED Notes (Signed)
 Dr. Billie approved pt to take percocet. Pt confirmed she can tolerate percocet medication fine.

## 2024-12-29 NOTE — ED Triage Notes (Signed)
 Pt bib pov c/o cyst on right breast that started three days ago. Pt has been using hot compresses without relief. Pt states the cyst has grown 5x bigger over the days. Cyst is tender to touch and firm around the perimeter.  Pt states in the past the cyst has burst on its own.   LMP 12/25/2024

## 2024-12-29 NOTE — ED Provider Notes (Signed)
 " [place] EMERGENC[facility]PART[facility] AT [facility] Provider Note   CSN: [id number] Arrival date & time: 12/29/24  9291     Patient presents with: Cyst   Beth Gibson is a 39 y.o. female.   Pt is a 40 yo female with pmhx significant for htn and anemia.  Pt said she has had abscesses in the past and feels like she has one forming on her right breast.  She said sx started 3 days ago.  She denies f/c.  She is hypertensive here.  She said she was on bp medication, but her pcp took her off of it because it was getting better.  She has no bp medication now.       Prior to Admission medications  Medication Sig Start Date End Date Taking? Authorizing Provider  amLODipine  (NORVASC ) 5 MG tablet Take 1 tablet (5 mg total) by mouth daily. 12/29/24  Yes Dean Clarity, MD  ibuprofen  (ADVIL ) 600 MG tablet Take 1 tablet (600 mg total) by mouth every 6 (six) hours as needed. 12/29/24  Yes Dean Clarity, MD  oxyCODONE -acetaminophen  (PERCOCET/ROXICET) 5-325 MG tablet Take 1 tablet by mouth every 6 (six) hours as needed for severe pain (pain score 7-10). 12/29/24  Yes Dean Clarity, MD  sulfamethoxazole -trimethoprim  (BACTRIM  DS) 800-160 MG tablet Take 1 tablet by mouth 2 (two) times daily for 7 days. 12/29/24 01/05/25 Yes Dean Clarity, MD  albuterol  (VENTOLIN  HFA) 108 [phone] Base) MCG/ACT inhaler Inhale 1-2 puffs into the lungs every 6 (six) hours as needed for wheezing or shortness of breath. 03/16/24   Hazen Darryle BRAVO, FNP  ketorolac  (TORADOL ) 10 MG tablet Take 1 tablet (10 mg total) by mouth every 6 (six) hours as needed. 10/31/24   Graham, Laura E, PA-C  lidocaine  (LIDODERM ) 5 % Place 1 patch onto the skin daily. Remove & Discard patch within 12 hours or as directed by MD 10/02/24   Myriam Dorn BROCKS, PA  methocarbamol  (ROBAXIN ) 500 MG tablet Take 1 tablet (500 mg total) by mouth 2 (two) times daily. 10/02/24   Myriam Dorn BROCKS, PA  Vitamin D, Ergocalciferol, (DRISDOL) 1.25 MG (50000 UNIT) CAPS  capsule Take 50,000 Units by mouth once a week. 09/11/24   [provider]    Allergies: Hydrocodone -acetaminophen  and Vicodin [hydrocodone -acetaminophen ]    Review of Systems  Skin:        Abscess right breast  All other systems reviewed and are negative.   Updated Vital Signs BP (!) 173/106   Pulse 72   Temp 97.9 F (36.6 C) (Oral)   Resp 20   Ht 5' 8 (1.727 m)   Wt 94.3 kg   LMP 12/25/2024   SpO2 100%   BMI 31.63 kg/m   Physical Exam Vitals and nursing note reviewed.  Constitutional:      Appearance: Normal appearance.  HENT:     Head: Normocephalic and atraumatic.     Right Ear: External ear normal.     Left Ear: External ear normal.     Nose: Nose normal.     Mouth/Throat:     Mouth: Mucous membranes are moist.     Pharynx: Oropharynx is clear.  Eyes:     Extraocular Movements: Extraocular movements intact.     Conjunctiva/sclera: Conjunctivae normal.     Pupils: Pupils are equal, round, and reactive to light.  Cardiovascular:     Rate and Rhythm: Normal rate and regular rhythm.     Pulses: Normal pulses.  Heart sounds: Normal heart sounds.  Pulmonary:     Effort: Pulmonary effort is normal.     Breath sounds: Normal breath sounds.  Abdominal:     General: Abdomen is flat. Bowel sounds are normal.     Palpations: Abdomen is soft.  Skin:    Capillary Refill: Capillary refill takes less than 2 seconds.     Comments: Abscess under right breast  Neurological:     General: No focal deficit present.     Mental Status: She is alert and oriented to person, place, and time.  Psychiatric:        Mood and Affect: Mood normal.        Behavior: Behavior normal.     (all labs ordered are listed, but only abnormal results are displayed) Labs Reviewed  COMPREHENSIVE METABOLIC PANEL WITH GFR - Abnormal; Notable for the following components:      Result Value   Glucose, Bld 109 (*)    Total Protein 8.2 (*)    All other components within normal limits   CBC WITH DIFFERENTIAL/PLATELET - Abnormal; Notable for the following components:   RBC 5.33 (*)    Hemoglobin 9.7 (*)    HCT 33.8 (*)    MCV 63.4 (*)    MCH 18.2 (*)    MCHC 28.7 (*)    RDW 22.7 (*)    All other components within normal limits  HCG, SERUM, QUALITATIVE    EKG: None  Radiology: No results found.   .Incision and Drainage  Date/Time: 12/29/2024 11:49 AM  Performed by: Dean Clarity, MD Authorized by: Dean Clarity, MD   Consent:    Consent obtained:  Verbal   Consent given by:  Patient Universal protocol:    Patient identity confirmed:  Verbally with patient Location:    Type:  Abscess   Location:  Trunk   Trunk location:  R breast Pre-procedure details:    Skin preparation:  Povidone-iodine Sedation:    Sedation type:  None Anesthesia:    Anesthesia method:  Local infiltration   Local anesthetic:  10 mL lidocaine -EPINEPHrine  1 %-1:100000 Procedure type:    Complexity:  Simple Procedure details:    Incision types:  Single straight   Wound management:  Probed and deloculated   Drainage:  Purulent   Drainage amount:  Copious   Wound treatment:  Wound left open   Packing materials:  None Post-procedure details:    Procedure completion:  Tolerated well, no immediate complications    Medications Ordered in the ED  oxyCODONE -acetaminophen  (PERCOCET/ROXICET) 5-325 MG per tablet 1 tablet (1 tablet Oral Given 12/29/24 0730)  bacitracin  ointment (1 Application Topical Given 12/29/24 1135)  lidocaine -EPINEPHrine  (XYLOCAINE  W/EPI) 2 %-1:200000 (PF) injection 10 mL (10 mLs Infiltration Given 12/29/24 1026)  ibuprofen  (ADVIL ) tablet 600 mg (600 mg Oral Given 12/29/24 1026)  sulfamethoxazole -trimethoprim  (BACTRIM  DS) 800-160 MG per tablet 1 tablet (1 tablet Oral Given 12/29/24 1135)                                    Medical Decision Making Amount and/or Complexity of Data Reviewed Labs: ordered.  Risk OTC drugs. Prescription drug management.   This  patient presents to the ED for concern of abscess, this involves an extensive number of treatment options, and is a complaint that carries with it a high risk of complications and morbidity.  The differential diagnosis includes abscess, cellulitis   Co  morbidities that complicate the patient evaluation  Htn and anemia   Additional history obtained:  Additional history obtained from epic chart review    Lab Tests:  I Ordered, and personally interpreted labs.  The pertinent results include:  cbc with hgb low at 9.7 (stable); cmp nl; preg neg   Medicines ordered and prescription drug management:  I ordered medication including percocet, ibuprofen , bactrim   for sx  Reevaluation of the patient after these medicines showed that the patient improved I have reviewed the patients home medicines and have made adjustments as needed  Problem List / ED Course:  Abscess:  drained.  Pt d/c with bactrim . HTN:  pt started on norvasc .  She is to make an appt with pcp. Anemia: chronic   Reevaluation:  After the interventions noted above, I reevaluated the patient and found that they have :improved   Social Determinants of Health:  Lives at home   Dispostion:  After consideration of the diagnostic results and the patients response to treatment, I feel that the patent would benefit from discharge with outpatient f/u.       Final diagnoses:  Breast abscess  Hypertension, unspecified type    ED Discharge Orders          Ordered    sulfamethoxazole -trimethoprim  (BACTRIM  DS) 800-160 MG tablet  2 times daily        12/29/24 1123    ibuprofen  (ADVIL ) 600 MG tablet  Every 6 hours PRN        12/29/24 1123    oxyCODONE -acetaminophen  (PERCOCET/ROXICET) 5-325 MG tablet  Every 6 hours PRN        12/29/24 1124    amLODipine  (NORVASC ) 5 MG tablet  Daily        12/29/24 1145               Dean Clarity, MD 12/29/24 1151  "

## 2025-01-19 ENCOUNTER — Ambulatory Visit
